# Patient Record
Sex: Female | Born: 1960
Health system: Southern US, Community
[De-identification: ages and names within clinical notes are randomized; demographics above are authoritative.]

## PROBLEM LIST (undated history)

## (undated) DIAGNOSIS — Z8489 Family history of other specified conditions: Secondary | ICD-10-CM

## (undated) DIAGNOSIS — R002 Palpitations: Secondary | ICD-10-CM

## (undated) DIAGNOSIS — M47817 Spondylosis without myelopathy or radiculopathy, lumbosacral region: Secondary | ICD-10-CM

## (undated) DIAGNOSIS — R011 Cardiac murmur, unspecified: Secondary | ICD-10-CM

## (undated) DIAGNOSIS — R112 Nausea with vomiting, unspecified: Secondary | ICD-10-CM

## (undated) DIAGNOSIS — Z860100 Personal history of colon polyps, unspecified: Secondary | ICD-10-CM

## (undated) DIAGNOSIS — K602 Anal fissure, unspecified: Secondary | ICD-10-CM

## (undated) DIAGNOSIS — M810 Age-related osteoporosis without current pathological fracture: Secondary | ICD-10-CM

## (undated) DIAGNOSIS — I4711 Inappropriate sinus tachycardia, so stated: Secondary | ICD-10-CM

## (undated) DIAGNOSIS — K219 Gastro-esophageal reflux disease without esophagitis: Secondary | ICD-10-CM

## (undated) DIAGNOSIS — Z9889 Other specified postprocedural states: Secondary | ICD-10-CM

## (undated) DIAGNOSIS — I639 Cerebral infarction, unspecified: Secondary | ICD-10-CM

## (undated) DIAGNOSIS — S82891A Other fracture of right lower leg, initial encounter for closed fracture: Secondary | ICD-10-CM

## (undated) DIAGNOSIS — S62101A Fracture of unspecified carpal bone, right wrist, initial encounter for closed fracture: Secondary | ICD-10-CM

## (undated) DIAGNOSIS — E8940 Asymptomatic postprocedural ovarian failure: Secondary | ICD-10-CM

## (undated) DIAGNOSIS — E785 Hyperlipidemia, unspecified: Secondary | ICD-10-CM

## (undated) DIAGNOSIS — F419 Anxiety disorder, unspecified: Secondary | ICD-10-CM

## (undated) DIAGNOSIS — E7800 Pure hypercholesterolemia, unspecified: Secondary | ICD-10-CM

## (undated) DIAGNOSIS — M1990 Unspecified osteoarthritis, unspecified site: Secondary | ICD-10-CM

## (undated) DIAGNOSIS — C801 Malignant (primary) neoplasm, unspecified: Secondary | ICD-10-CM

## (undated) DIAGNOSIS — K589 Irritable bowel syndrome without diarrhea: Secondary | ICD-10-CM

## (undated) DIAGNOSIS — K635 Polyp of colon: Secondary | ICD-10-CM

## (undated) DIAGNOSIS — G43909 Migraine, unspecified, not intractable, without status migrainosus: Secondary | ICD-10-CM

## (undated) DIAGNOSIS — R000 Tachycardia, unspecified: Secondary | ICD-10-CM

## (undated) DIAGNOSIS — F4310 Post-traumatic stress disorder, unspecified: Secondary | ICD-10-CM

## (undated) DIAGNOSIS — Z87442 Personal history of urinary calculi: Secondary | ICD-10-CM

## (undated) DIAGNOSIS — R921 Mammographic calcification found on diagnostic imaging of breast: Secondary | ICD-10-CM

## (undated) DIAGNOSIS — M8580 Other specified disorders of bone density and structure, unspecified site: Secondary | ICD-10-CM

## (undated) DIAGNOSIS — D649 Anemia, unspecified: Secondary | ICD-10-CM

## (undated) DIAGNOSIS — G4730 Sleep apnea, unspecified: Secondary | ICD-10-CM

## (undated) HISTORY — PX: WISDOM TOOTH EXTRACTION: SHX21

## (undated) HISTORY — DX: Gastro-esophageal reflux disease without esophagitis: K21.9

## (undated) HISTORY — DX: Inappropriate sinus tachycardia, so stated: I47.11

## (undated) HISTORY — DX: Cerebral infarction, unspecified: I63.9

## (undated) HISTORY — DX: Asymptomatic postprocedural ovarian failure: E89.40

## (undated) HISTORY — PX: PELVIC LAPAROSCOPY: SHX162

## (undated) HISTORY — DX: Tachycardia, unspecified: R00.0

## (undated) HISTORY — DX: Hyperlipidemia, unspecified: E78.5

## (undated) HISTORY — DX: Other specified disorders of bone density and structure, unspecified site: M85.80

## (undated) HISTORY — DX: Mammographic calcification found on diagnostic imaging of breast: R92.1

## (undated) HISTORY — DX: Age-related osteoporosis without current pathological fracture: M81.0

## (undated) HISTORY — DX: Pure hypercholesterolemia, unspecified: E78.00

## (undated) HISTORY — PX: TONSILLECTOMY: SHX5217

## (undated) HISTORY — PX: KNEE SURGERY: SHX244

## (undated) HISTORY — DX: Polyp of colon: K63.5

## (undated) HISTORY — DX: Fracture of unspecified carpal bone, right wrist, initial encounter for closed fracture: S62.101A

## (undated) HISTORY — DX: Anal fissure, unspecified: K60.2

## (undated) HISTORY — PX: ANAL FISSURE REPAIR: SHX2312

## (undated) HISTORY — DX: Personal history of colon polyps, unspecified: Z86.0100

## (undated) HISTORY — DX: Spondylosis without myelopathy or radiculopathy, lumbosacral region: M47.817

## (undated) HISTORY — DX: Irritable bowel syndrome, unspecified: K58.9

## (undated) HISTORY — DX: Personal history of colonic polyps: Z86.010

## (undated) HISTORY — DX: Anxiety disorder, unspecified: F41.9

## (undated) HISTORY — DX: Migraine, unspecified, not intractable, without status migrainosus: G43.909

## (undated) HISTORY — PX: OOPHORECTOMY: SHX86

## (undated) HISTORY — DX: Other fracture of right lower leg, initial encounter for closed fracture: S82.891A

## (undated) HISTORY — DX: Palpitations: R00.2

---

## 1992-10-29 HISTORY — PX: VAGINAL HYSTERECTOMY: SUR661

## 1998-10-29 HISTORY — PX: APPENDECTOMY: SHX54

## 2003-01-18 DIAGNOSIS — Z8679 Personal history of other diseases of the circulatory system: Secondary | ICD-10-CM | POA: Insufficient documentation

## 2014-10-10 DIAGNOSIS — G43709 Chronic migraine without aura, not intractable, without status migrainosus: Secondary | ICD-10-CM | POA: Insufficient documentation

## 2015-03-02 DIAGNOSIS — K219 Gastro-esophageal reflux disease without esophagitis: Secondary | ICD-10-CM | POA: Insufficient documentation

## 2015-03-02 DIAGNOSIS — Z86010 Personal history of colonic polyps: Secondary | ICD-10-CM | POA: Insufficient documentation

## 2015-03-02 DIAGNOSIS — Z800 Family history of malignant neoplasm of digestive organs: Secondary | ICD-10-CM | POA: Insufficient documentation

## 2015-03-02 DIAGNOSIS — K449 Diaphragmatic hernia without obstruction or gangrene: Secondary | ICD-10-CM

## 2015-03-16 DIAGNOSIS — R002 Palpitations: Secondary | ICD-10-CM | POA: Insufficient documentation

## 2015-03-30 HISTORY — PX: COLONOSCOPY W/ POLYPECTOMY: SHX1380

## 2015-04-01 HISTORY — PX: COLONOSCOPY WITH ESOPHAGOGASTRODUODENOSCOPY (EGD): SHX5779

## 2015-04-01 LAB — HM COLONOSCOPY

## 2016-08-29 LAB — HM MAMMOGRAPHY

## 2017-08-09 ENCOUNTER — Encounter: Payer: Self-pay | Admitting: Neurology

## 2017-08-09 ENCOUNTER — Encounter (INDEPENDENT_AMBULATORY_CARE_PROVIDER_SITE_OTHER): Payer: Self-pay

## 2017-08-09 ENCOUNTER — Encounter: Payer: Self-pay | Admitting: Physician Assistant

## 2017-08-09 ENCOUNTER — Ambulatory Visit (INDEPENDENT_AMBULATORY_CARE_PROVIDER_SITE_OTHER): Payer: 59 | Admitting: Physician Assistant

## 2017-08-09 VITALS — BP 115/78 | HR 72 | Ht 68.0 in | Wt 144.0 lb

## 2017-08-09 DIAGNOSIS — M810 Age-related osteoporosis without current pathological fracture: Secondary | ICD-10-CM | POA: Insufficient documentation

## 2017-08-09 DIAGNOSIS — Z7689 Persons encountering health services in other specified circumstances: Secondary | ICD-10-CM

## 2017-08-09 DIAGNOSIS — Z87891 Personal history of nicotine dependence: Secondary | ICD-10-CM | POA: Insufficient documentation

## 2017-08-09 DIAGNOSIS — F329 Major depressive disorder, single episode, unspecified: Secondary | ICD-10-CM | POA: Diagnosis not present

## 2017-08-09 DIAGNOSIS — Z90710 Acquired absence of both cervix and uterus: Secondary | ICD-10-CM

## 2017-08-09 DIAGNOSIS — F419 Anxiety disorder, unspecified: Secondary | ICD-10-CM | POA: Diagnosis not present

## 2017-08-09 DIAGNOSIS — F3289 Other specified depressive episodes: Secondary | ICD-10-CM

## 2017-08-09 DIAGNOSIS — F5105 Insomnia due to other mental disorder: Secondary | ICD-10-CM | POA: Diagnosis not present

## 2017-08-09 DIAGNOSIS — Z8739 Personal history of other diseases of the musculoskeletal system and connective tissue: Secondary | ICD-10-CM | POA: Diagnosis not present

## 2017-08-09 DIAGNOSIS — F411 Generalized anxiety disorder: Secondary | ICD-10-CM | POA: Insufficient documentation

## 2017-08-09 DIAGNOSIS — IMO0002 Reserved for concepts with insufficient information to code with codable children: Secondary | ICD-10-CM

## 2017-08-09 DIAGNOSIS — G43709 Chronic migraine without aura, not intractable, without status migrainosus: Secondary | ICD-10-CM | POA: Diagnosis not present

## 2017-08-09 DIAGNOSIS — E789 Disorder of lipoprotein metabolism, unspecified: Secondary | ICD-10-CM | POA: Insufficient documentation

## 2017-08-09 DIAGNOSIS — F99 Mental disorder, not otherwise specified: Secondary | ICD-10-CM | POA: Diagnosis not present

## 2017-08-09 DIAGNOSIS — J309 Allergic rhinitis, unspecified: Secondary | ICD-10-CM | POA: Insufficient documentation

## 2017-08-09 MED ORDER — SUVOREXANT 20 MG PO TABS: 1.0000 | ORAL_TABLET | Freq: Every day | ORAL | 0 refills | Status: DC

## 2017-08-09 MED ORDER — CALCIUM CARBONATE-VITAMIN D 600-400 MG-UNIT PO TABS: 1.0000 | ORAL_TABLET | Freq: Two times a day (BID) | ORAL | 11 refills | Status: AC

## 2017-08-09 MED ORDER — SUVOREXANT 15 MG PO TABS: 1.0000 | ORAL_TABLET | Freq: Every day | ORAL | 0 refills | Status: DC

## 2017-08-09 MED ORDER — SUVOREXANT 10 MG PO TABS: 10.0000 mg | ORAL_TABLET | Freq: Every day | ORAL | 0 refills | Status: DC

## 2017-08-09 MED ORDER — TOPIRAMATE 25 MG PO TABS: 75.0000 mg | ORAL_TABLET | Freq: Every day | ORAL | 1 refills | Status: DC

## 2017-08-09 NOTE — Patient Instructions (Addendum)
- Trial Belsomra. Start with 32m at bedtime for 3 nights. If not effective, increase to 134mand so on - Sleep hygiene  Sleep Hygiene . Limiting daytime naps to 30 minutes . Napping does not make up for inadequate nighttime sleep. However, a short nap of 20-30 minutes can help to improve mood, alertness and performance.  . Avoiding stimulants such as  caffeine and nicotine close to bedtime.  And when it comes to alcohol, moderation is key 4. While alcohol is well-known to help you fall asleep faster, too much close to bedtime can disrupt sleep in the second half of the night as the body begins to process the alcohol.    . Exercising to promote good quality sleep.  As little as 10 minutes of aerobic exercise, such as walking or cycling, can drastically improve nighttime sleep quality.  For the best night's sleep, most people should avoid strenuous workouts close to bedtime. However, the effect of intense nighttime exercise on sleep differs from person to person, so find out what works best for you.   . Steering clear of food that can be disruptive right before sleep.   Heavy or rich foods, fatty or fried meals, spicy dishes, citrus fruits, and carbonated drinks can trigger indigestion for some people. When this occurs close to bedtime, it can lead to painful heartburn that disrupts sleep. . Ensuring adequate exposure to natural light.  This is particularly important for individuals who may not venture outside frequently. Exposure to sunlight during the day, as well as darkness at night, helps to maintain a healthy sleep-wake cycle . . Marland Kitchenstablishing a regular relaxing bedtime routine.  A regular nightly routine helps the body recognize that it is bedtime. This could include taking warm shower or bath, reading a book, or light stretches. When possible, try to avoid emotionally upsetting conversations and activities before attempting to sleep. . Making sure that the sleep environment is pleasant.  Mattress and  pillows should be comfortable. The bedroom should be cool - between 60 and 67 degrees - for optimal sleep. Bright light from lamps, cell phone and TV screens can make it difficult to fall asleep4, so turn those light off or adjust them when possible. Consider using blackout curtains, eye shades, ear plugs, "white noise" machines, humidifiers, fans and other devices that can make the bedroom more relaxing.     Denosumab injection What is this medicine? DENOSUMAB (den oh sue mab) slows bone breakdown. Prolia is used to treat osteoporosis in women after menopause and in men. XgDelton Sees used to treat a high calcium level due to cancer and to prevent bone fractures and other bone problems caused by multiple myeloma or cancer bone metastases. XgDelton Sees also used to treat giant cell tumor of the bone. This medicine may be used for other purposes; ask your health care provider or pharmacist if you have questions. COMMON BRAND NAME(S): Prolia, XGEVA What should I tell my health care provider before I take this medicine? They need to know if you have any of these conditions: -dental disease -having surgery or tooth extraction -infection -kidney disease -low levels of calcium or Vitamin D in the blood -malnutrition -on hemodialysis -skin conditions or sensitivity -thyroid or parathyroid disease -an unusual reaction to denosumab, other medicines, foods, dyes, or preservatives -pregnant or trying to get pregnant -breast-feeding How should I use this medicine? This medicine is for injection under the skin. It is given by a health care professional in a hospital or clinic setting.  If you are getting Prolia, a special MedGuide will be given to you by the pharmacist with each prescription and refill. Be sure to read this information carefully each time. For Prolia, talk to your pediatrician regarding the use of this medicine in children. Special care may be needed. For Delton See, talk to your pediatrician  regarding the use of this medicine in children. While this drug may be prescribed for children as young as 13 years for selected conditions, precautions do apply. Overdosage: If you think you have taken too much of this medicine contact a poison control center or emergency room at once. NOTE: This medicine is only for you. Do not share this medicine with others. What if I miss a dose? It is important not to miss your dose. Call your doctor or health care professional if you are unable to keep an appointment. What may interact with this medicine? Do not take this medicine with any of the following medications: -other medicines containing denosumab This medicine may also interact with the following medications: -medicines that lower your chance of fighting infection -steroid medicines like prednisone or cortisone This list may not describe all possible interactions. Give your health care provider a list of all the medicines, herbs, non-prescription drugs, or dietary supplements you use. Also tell them if you smoke, drink alcohol, or use illegal drugs. Some items may interact with your medicine. What should I watch for while using this medicine? Visit your doctor or health care professional for regular checks on your progress. Your doctor or health care professional may order blood tests and other tests to see how you are doing. Call your doctor or health care professional for advice if you get a fever, chills or sore throat, or other symptoms of a cold or flu. Do not treat yourself. This drug may decrease your body's ability to fight infection. Try to avoid being around people who are sick. You should make sure you get enough calcium and vitamin D while you are taking this medicine, unless your doctor tells you not to. Discuss the foods you eat and the vitamins you take with your health care professional. See your dentist regularly. Brush and floss your teeth as directed. Before you have any dental  work done, tell your dentist you are receiving this medicine. Do not become pregnant while taking this medicine or for 5 months after stopping it. Talk with your doctor or health care professional about your birth control options while taking this medicine. Women should inform their doctor if they wish to become pregnant or think they might be pregnant. There is a potential for serious side effects to an unborn child. Talk to your health care professional or pharmacist for more information. What side effects may I notice from receiving this medicine? Side effects that you should report to your doctor or health care professional as soon as possible: -allergic reactions like skin rash, itching or hives, swelling of the face, lips, or tongue -bone pain -breathing problems -dizziness -jaw pain, especially after dental work -redness, blistering, peeling of the skin -signs and symptoms of infection like fever or chills; cough; sore throat; pain or trouble passing urine -signs of low calcium like fast heartbeat, muscle cramps or muscle pain; pain, tingling, numbness in the hands or feet; seizures -unusual bleeding or bruising -unusually weak or tired Side effects that usually do not require medical attention (report to your doctor or health care professional if they continue or are bothersome): -constipation -diarrhea -headache -joint pain -loss  of appetite -muscle pain -runny nose -tiredness -upset stomach This list may not describe all possible side effects. Call your doctor for medical advice about side effects. You may report side effects to FDA at 1-800-FDA-1088. Where should I keep my medicine? This medicine is only given in a clinic, doctor's office, or other health care setting and will not be stored at home. NOTE: This sheet is a summary. It may not cover all possible information. If you have questions about this medicine, talk to your doctor, pharmacist, or health care provider.  2018  Elsevier/Gold Standard (2016-11-06 19:17:21)

## 2017-08-09 NOTE — Progress Notes (Signed)
HPI:                                                                Alice Williams is a 56 y.o. female who presents to [facility] [place] today to establish care  Chronic insomnia: patient reports longstanding history of insomnia for many years. She endorses multiple nighttime awakenings every 1-2 hours, difficulty with sleep maintenance, and does not ever feel rested. Prior treatments tried include Unisom, Ambien (2000)  Inappropriate sinus tachycardia: she was evaluated by Cardiology at [facility] in 2015. She had a nondiagnostic tilt table and normal echocardiogram. She is doing well on Nadolol daily and an extra dose prn for activity. She denies any palpitations or syncope.   Chronic Migraine: reports history of complex migraines/occular migraines. Currently on topamax 75mg  daily but still having 4 headaches per week. She is allergic to triptans. She has been on Elavil and other beta blockers int he past. She is requesting a referral to Neuro.   Past Medical History:  Diagnosis Date  . Anxiety   . Breast calcifications on mammogram   . GERD (gastroesophageal reflux disease)   . History of colon polyps   . Inappropriate sinus tachycardia   . Migraines   . Osteopenia   . Palpitations   . Premature surgical menopause    Past Surgical History:  Procedure Laterality Date  . ANAL FISSURE REPAIR    . APPENDECTOMY    . COLONOSCOPY W/ POLYPECTOMY  03/2015  . KNEE SURGERY    . OOPHORECTOMY    . [place]  . PELVIC LAPAROSCOPY     x 4 for endometriosis   . VAGINAL HYSTERECTOMY     Social History  Substance Use Topics  . Smoking status: Former Smoker    Quit date: 08/09/2006  . Smokeless tobacco: Never Used  . Alcohol use Yes   family history includes Alcohol abuse in her father; Breast cancer in her mother; Colon cancer in her mother; Heart attack in her father; Hyperlipidemia in her brother and father.  ROS: Review of  Systems  Constitutional: Negative.   HENT: Negative.   Eyes: Negative.   Respiratory: Negative.   Cardiovascular: Positive for palpitations.  Gastrointestinal: Negative.   Genitourinary: Negative.   Musculoskeletal: Negative.   Skin: Negative.   Neurological: Positive for headaches (migraines).  Endo/Heme/Allergies: Negative.   Psychiatric/Behavioral: Positive for depression. The patient is nervous/anxious and has insomnia.      Medications: Current Outpatient Prescriptions  Medication Sig Dispense Refill  . aspirin-acetaminophen-caffeine (EXCEDRIN MIGRAINE) 250-250-65 MG tablet Take by mouth.    . hydrOXYzine (ATARAX/VISTARIL) 10 MG tablet Take 1 tablet by mouth 3 (three) times daily as needed.    . nadolol (CORGARD) 20 MG tablet Take 1 tablet by mouth daily.    Marland Kitchen tiZANidine (ZANAFLEX) 4 MG capsule Take 1 capsule by mouth 3 (three) times daily as needed.    . topiramate (TOPAMAX) 25 MG tablet Take 3 tablets (75 mg total) by mouth at bedtime. 90 tablet 1  . Calcium Carbonate-Vitamin D 600-400 MG-UNIT tablet Take 1 tablet by mouth 2 (two) times daily. 60 tablet 11  . Suvorexant 10 MG TABS Take 10 mg by mouth at bedtime. 10 tablet 0  .  Suvorexant 15 MG TABS Take 1 tablet by mouth at bedtime. 10 tablet 0  . Suvorexant 20 MG TABS Take 1 tablet by mouth at bedtime. 10 tablet 0   No current facility-administered medications for this visit.    Allergies  Allergen Reactions  . Sulfa Antibiotics Anaphylaxis  . Sumatriptan Succinate Anaphylaxis    Objective:  BP 115/78   Pulse 72   Ht 5\' 8"  (1.727 m)   Wt 144 lb (65.3 kg)   BMI 21.90 kg/m  Gen:  alert, not ill-appearing, no distress, appropriate for age 70: head normocephalic without obvious abnormality, conjunctiva and cornea clear, trachea midline Pulm: Normal work of breathing, normal phonation Neuro: alert and oriented x 3, no tremor MSK: extremities atraumatic, normal gait and station Skin: intact, no rashes on exposed  skin, no jaundice, no cyanosis Psych: well-groomed, cooperative, good eye contact, depressed mood, affect mood-congruent, speech is articulate, and thought processes clear and goal-directed  Depression screen [facility] 2/9 08/09/2017  Decreased Interest 2  Down, Depressed, Hopeless 2  PHQ - 2 Score 4  Altered sleeping 3  Tired, decreased energy 3  Change in appetite 1  Feeling bad or failure about yourself  0  Trouble concentrating 1  Moving slowly or fidgety/restless 0  Suicidal thoughts 0  PHQ-9 Score 12    No results found for this or any previous visit (from the past 72 hour(s)). No results found.    Assessment and Plan: 56 y.o. female with   1. Encounter to establish care - reviewed PMH, PSH, PFH, meds and allergies - reviewed health maintenance - Colonoscopy UTD, due 2021, requesting outside records - not a candidate for Pap - Mammogram UTD, 08/2016, requesting records - influenza UTD per patient [facility] employee)  2. Anxiety disorder, unspecified type - patient declines management today. Prefers to address insomnia  3. Insomnia due to other mental disorder - will trial Belsomra. Provided with coupon card for free samples.  - sleep hygiene - follow-up in 4 weeks - Suvorexant 10 MG TABS; Take 10 mg by mouth at bedtime.  Dispense: 10 tablet; Refill: 0 - Suvorexant 15 MG TABS; Take 1 tablet by mouth at bedtime.  Dispense: 10 tablet; Refill: 0 - Suvorexant 20 MG TABS; Take 1 tablet by mouth at bedtime.  Dispense: 10 tablet; Refill: 0  4. Minor depressive disorder - PHQ9 score 12 - no acute safety issues - patient declines management today. Prefers to address insomnia  5. Chronic migraine - Ambulatory referral to Neurology. Patient may be a good candidate for Aimovig or Botox - topiramate (TOPAMAX) 25 MG tablet; Take 3 tablets (75 mg total) by mouth at bedtime.  Dispense: 90 tablet; Refill: 1  6. Hx of osteopenia - due for DEXA. She underwent early surgical menopause -  reports severe GERD with bisphosphonates. May be a good candidate for prolia - counseled on importance of calcium and vitamin d supplementaiton - Calcium Carbonate-Vitamin D 600-400 MG-UNIT tablet; Take 1 tablet by mouth 2 (two) times daily.  Dispense: 60 tablet; Refill: 11 - DG Bone Density; Future  7. History of hysterectomy for benign disease  Patient education and anticipatory guidance given Patient agrees with treatment plan Follow-up as needed if symptoms worsen or fail to improve  Darlyne Russian PA-C

## 2017-08-12 ENCOUNTER — Other Ambulatory Visit: Payer: Self-pay | Admitting: Physician Assistant

## 2017-08-12 DIAGNOSIS — F99 Mental disorder, not otherwise specified: Principal | ICD-10-CM

## 2017-08-12 DIAGNOSIS — F5105 Insomnia due to other mental disorder: Secondary | ICD-10-CM

## 2017-08-14 ENCOUNTER — Encounter: Payer: Self-pay | Admitting: Physician Assistant

## 2017-08-21 ENCOUNTER — Other Ambulatory Visit: Payer: 59

## 2017-08-21 ENCOUNTER — Other Ambulatory Visit: Payer: Self-pay | Admitting: Physician Assistant

## 2017-08-21 DIAGNOSIS — Z1231 Encounter for screening mammogram for malignant neoplasm of breast: Secondary | ICD-10-CM

## 2017-08-27 ENCOUNTER — Encounter: Payer: Self-pay | Admitting: Physician Assistant

## 2017-08-27 DIAGNOSIS — M5416 Radiculopathy, lumbar region: Secondary | ICD-10-CM | POA: Insufficient documentation

## 2017-08-27 DIAGNOSIS — G8929 Other chronic pain: Secondary | ICD-10-CM | POA: Insufficient documentation

## 2017-08-27 DIAGNOSIS — M533 Sacrococcygeal disorders, not elsewhere classified: Secondary | ICD-10-CM

## 2017-08-28 ENCOUNTER — Encounter: Payer: Self-pay | Admitting: Physician Assistant

## 2017-08-28 ENCOUNTER — Ambulatory Visit (INDEPENDENT_AMBULATORY_CARE_PROVIDER_SITE_OTHER): Payer: 59

## 2017-08-28 ENCOUNTER — Encounter: Payer: Self-pay | Admitting: Family Medicine

## 2017-08-28 ENCOUNTER — Ambulatory Visit (INDEPENDENT_AMBULATORY_CARE_PROVIDER_SITE_OTHER): Payer: 59 | Admitting: Family Medicine

## 2017-08-28 VITALS — BP 118/71 | HR 67 | Wt 145.0 lb

## 2017-08-28 DIAGNOSIS — M810 Age-related osteoporosis without current pathological fracture: Secondary | ICD-10-CM | POA: Diagnosis not present

## 2017-08-28 DIAGNOSIS — M5416 Radiculopathy, lumbar region: Secondary | ICD-10-CM | POA: Diagnosis not present

## 2017-08-28 DIAGNOSIS — M47817 Spondylosis without myelopathy or radiculopathy, lumbosacral region: Secondary | ICD-10-CM | POA: Diagnosis not present

## 2017-08-28 DIAGNOSIS — M533 Sacrococcygeal disorders, not elsewhere classified: Secondary | ICD-10-CM

## 2017-08-28 DIAGNOSIS — Z780 Asymptomatic menopausal state: Secondary | ICD-10-CM | POA: Diagnosis not present

## 2017-08-28 DIAGNOSIS — Z8739 Personal history of other diseases of the musculoskeletal system and connective tissue: Secondary | ICD-10-CM

## 2017-08-28 DIAGNOSIS — Z1231 Encounter for screening mammogram for malignant neoplasm of breast: Secondary | ICD-10-CM | POA: Diagnosis not present

## 2017-08-28 DIAGNOSIS — M545 Low back pain: Secondary | ICD-10-CM | POA: Diagnosis not present

## 2017-08-28 HISTORY — DX: Age-related osteoporosis without current pathological fracture: M81.0

## 2017-08-28 HISTORY — DX: Spondylosis without myelopathy or radiculopathy, lumbosacral region: M47.817

## 2017-08-28 NOTE — Progress Notes (Signed)
Good morning,  Your x-rays show arthritis of your low back/sacrum. There is no fracture, dislocation, or degenerative disc disease.  I still recommend follow-up with Sports Medicine at your earliest convenience to address this.  Best, Evlyn Clines

## 2017-08-28 NOTE — Progress Notes (Signed)
Good morning Alice Williams,  Your bone density test shows osteoporosis of your hip and decreased bone mineral density of your spine (aka osteopenia). This increases your overall risk of fracture. I recommend you make an appointment with me to discuss treatment options.   Best, Evlyn Clines

## 2017-08-28 NOTE — Patient Instructions (Signed)
Thank you for coming in today. Attend PT.  Use a heating pad and TENS unit.  Do the home exercises even when you get better.   Recheck with me 6 weeks.   TENS UNIT: This is helpful for muscle pain and spasm.   Search and Purchase a TENS 7000 2nd edition at  www.tenspros.com or www.[email or web address].com It should be less than $30.     TENS unit instructions: Do not shower or bathe with the unit on Turn the unit off before removing electrodes or batteries If the electrodes lose stickiness add a drop of water to the electrodes after they are disconnected from the unit and place on plastic sheet. If you continued to have difficulty, call the TENS unit company to purchase more electrodes. Do not apply lotion on the skin area prior to use. Make sure the skin is clean and dry as this will help prolong the life of the electrodes. After use, always check skin for unusual red areas, rash or other skin difficulties. If there are any skin problems, does not apply electrodes to the same area. Never remove the electrodes from the unit by pulling the wires. Do not use the TENS unit or electrodes other than as directed. Do not change electrode placement without consultating your therapist or physician. Keep 2 fingers with between each electrode. Wear time ratio is 2:1, on to off times.    For example on for 30 minutes off for 15 minutes and then on for 30 minutes off for 15 minutes     Lumbosacral Strain Lumbosacral strain is an injury that causes pain in the lower back (lumbosacral spine). This injury usually occurs from overstretching the muscles or ligaments along your spine. A strain can affect one or more muscles or cord-like tissues that connect bones to other bones (ligaments). What are the causes? This condition may be caused by:  A hard, direct hit (blow) to the back.  Excessive stretching of the lower back muscles. This may result from: ? A fall. ? Lifting something heavy. ? Repetitive  movements such as bending or crouching.  What increases the risk? The following factors may increase your risk of getting this condition:  Participating in sports or activities that involve: ? A sudden twist of the back. ? Pushing or pulling motions.  Being overweight or obese.  Having poor strength and flexibility, especially tight hamstrings or weak muscles in the back or abdomen.  Having too much of a curve in the lower back.  Having a pelvis that is tilted forward.  What are the signs or symptoms? The main symptom of this condition is pain in the lower back, at the site of the strain. Pain may extend (radiate) down one or both legs. How is this diagnosed? This condition is diagnosed based on:  Your symptoms.  Your medical history.  A physical exam. ? Your health care provider may push on certain areas of your back to determine the source of your pain. ? You may be asked to bend forward, backward, and side to side to assess the severity of your pain and your range of motion.  Imaging tests, such as: ? X-rays. ? MRI.  How is this treated? Treatment for this condition may include:  Putting heat and cold on the affected area.  Medicines to help relieve pain and relax your muscles (muscle relaxants).  NSAIDs to help reduce swelling and discomfort.  When your symptoms improve, it is important to gradually return to your normal routine  as soon as possible to reduce pain, avoid stiffness, and avoid loss of muscle strength. Generally, symptoms should improve within 6 weeks of treatment. However, recovery time varies. Follow these instructions at home: Managing pain, stiffness, and swelling   If directed, put ice on the injured area during the first 24 hours after your strain. ? Put ice in a plastic bag. ? Place a towel between your skin and the bag. ? Leave the ice on for 20 minutes, 2-3 times a day.  If directed, put heat on the affected area as often as told by your  health care provider. Use the heat source that your health care provider recommends, such as a moist heat pack or a heating pad. ? Place a towel between your skin and the heat source. ? Leave the heat on for 20-30 minutes. ? Remove the heat if your skin turns bright red. This is especially important if you are unable to feel pain, heat, or cold. You may have a greater risk of getting burned. Activity  Rest and return to your normal activities as told by your health care provider. Ask your health care provider what activities are safe for you.  Avoid activities that take a lot of energy for as long as told by your health care provider. General instructions  Take over-the-counter and prescription medicines only as told by your health care provider.  Donot drive or use heavy machinery while taking prescription pain medicine.  Do not use any products that contain nicotine or tobacco, such as cigarettes and e-cigarettes. If you need help quitting, ask your health care provider.  Keep all follow-up visits as told by your health care provider. This is important. How is this prevented?  Use correct form when playing sports and lifting heavy objects.  Use good posture when sitting and standing.  Maintain a healthy weight.  Sleep on a mattress with medium firmness to support your back.  Be safe and responsible while being active to avoid falls.  Do at least 150 minutes of moderate-intensity exercise each week, such as brisk walking or water aerobics. Try a form of exercise that takes stress off your back, such as swimming or stationary cycling.  Maintain physical fitness, including: ? Strength. ? Flexibility. ? Cardiovascular fitness. ? Endurance. Contact a health care provider if:  Your back pain does not improve after 6 weeks of treatment.  Your symptoms get worse. Get help right away if:  Your back pain is severe.  You cannot stand or walk.  You have difficulty controlling  when you urinate or when you have a bowel movement.  You feel nauseous or you vomit.  Your feet get very cold.  You have numbness, tingling, weakness, or problems using your arms or legs.  You develop any of the following: ? Shortness of breath. ? Dizziness. ? Pain in your legs. ? Weakness in your buttocks or legs. ? Discoloration of the skin on your toes or legs. This information is not intended to replace advice given to you by your health care provider. Make sure you discuss any questions you have with your health care provider. Document Released: 07/25/2005 Document Revised: 05/04/2016 Document Reviewed: 03/18/2016 Elsevier Interactive Patient Education  2017 [employer].

## 2017-08-29 NOTE — Progress Notes (Signed)
Subjective:    I'm seeing this patient as a consultation for:  Trixie Dredge, PA-C   CC: back pain  HPI: Deshayla notes chronic low back pain ongoing for years. The pain has been worsening over the last few months. 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 She notes the pain is present with prolonged sitting and transitioning from sitting to standing. . She denies any pain with walking and with laying.  She denies any significant radiating pain bowel bladder dysfunction fevers or chills. She feels well otherwise.  She has tried ibuprofen which does help some. She has not tried any physical therapy activities. She was seen by her primary care provider earlier this month who obtained a back x-ray showing some degenerative changes.   Past medical history, Surgical history, Family history not pertinant except as noted below, Social history, Allergies, and medications have been entered into the medical record, reviewed, and no changes needed.   Review of Systems: No headache, visual changes, nausea, vomiting, diarrhea, constipation, dizziness, abdominal pain, skin rash, fevers, chills, night sweats, weight loss, swollen lymph nodes, body aches, joint swelling, muscle aches, chest pain, shortness of breath, mood changes, visual or auditory hallucinations.   Objective:    Vitals:   08/28/17 1310  BP: 118/71  Pulse: 67   General: Well Developed, well nourished, and in no acute distress.  Neuro/Psych: Alert and oriented x3, extra-ocular muscles intact, able to move all 4 extremities, sensation grossly intact. Skin: Warm and dry, no rashes noted.  Respiratory: Not using accessory muscles, speaking in full sentences, trachea midline.  Cardiovascular: Pulses palpable, no extremity edema. Abdomen: Does not appear distended. MSK:  L spine nontender to spinal midline. Minimally tender palpation bilateral lumbar paraspinal muscles. Lumbar motion normal  flexion slightly reduced rotation and lateral flexion. Reduced extension with pain.   Lower extremity sensation reflexes and strength are intact with the exception of hip adduction which is slightly reduced bilaterally 4+/5.  Normal gait.  No results found for this or any previous visit (from the past 24 hour(s)). Dg Lumbar Spine Complete  Result Date: 08/28/2017 CLINICAL DATA:  Lumbago for 2 months EXAM: LUMBAR SPINE - COMPLETE 4+ VIEW COMPARISON:  None. FINDINGS: Frontal, lateral, spot lumbosacral lateral, and bilateral oblique views were obtained. There are 5 non-rib-bearing lumbar type vertebral bodies. There is slight upper lumbar dextroscoliosis. There is no evident fracture or spondylolisthesis. Disc spaces appear unremarkable. There is facet osteoarthritic change at L5-S1 bilaterally. IMPRESSION: Facet osteoarthritic change at L5-S1 bilaterally. No appreciable disc space narrowing. No fracture or spondylolisthesis. Mild scoliosis present. Electronically Signed   By: Lowella Grip III M.D.   On: 08/28/2017 11:09   Dg Sacrum/coccyx  Result Date: 08/28/2017 CLINICAL DATA:  Pain for 2 months EXAM: SACRUM AND COCCYX - 2+ VIEW COMPARISON:  None. FINDINGS: There is no evidence of fracture or other focal bone lesions. Frontal, tilt frontal, and lateral views were obtained. There is no fracture or diastases. Sacroiliac joints appear symmetric and normal bilaterally. No sacroiliitis. Visualized portions of hip joints appear normal bilaterally. There is postoperative change in the left pelvis. IMPRESSION: No fracture or diastases.  No evident arthropathic change. Electronically Signed   By: Lowella Grip III M.D.   On: 08/28/2017 11:08   Dg Bone Density  Result Date: 08/28/2017 EXAM: DUAL X-RAY ABSORPTIOMETRY (DXA) FOR BONE MINERAL DENSITY IMPRESSION: Referring Physician:  Elson Areas CUMMINGS PATIENT: Name: Natha, Guin Patient ID: [id number] Birth Date: 27-Mar-1961  Height: 67.0 in.  Sex: Female Measured: 08/28/2017 Weight: 141.0 lbs. Indications: Caucasian, Estrogen Deficiency, Hysterectomy, Low Calcium Intake, Oophorectomy (Unilateral), Postmenopausal, Previous Smoker Fractures: Ribs Treatments: Boniva, HRT ASSESSMENT: The BMD measured at Femur Total Right is 0.692 g/cm2 with a T-score of -2.5. This patient is considered osteoporotic according to [facility]) criteria. Site Region Measured Date Measured Age WHO YA BMD Classification T-score AP Spine L1-L4 08/28/2017 55.7 Osteopenia -2.1 0.939 g/cm2 DualFemur Total Right 9/28/2017 55.8 years Osteoporosis -2.5 0.692 g/cm2 World Health Organization [facility]) criteria for post-menopausal, Caucasian Women: Normal       T-score at or above -1 SD Osteopenia   T-score between -1 and -2.5 SD Osteoporosis T-score at or below -2.5 SD RECOMMENDATION: [place] recommends that FDA-approved medical therapies be considered in postmenopausal women and men age 62 or older with a: 1. Hip or vertebral (clinical or morphometric) fracture. 2. T-score of <-2.5 at the spine or hip. 3. Ten-year fracture probability by FRAX of 3% or greater for hip fracture or 20% or greater for major osteoporotic fracture. All treatments decisions require clinical judgment and consideration of individual patient factors, including patient preferences, co-morbidities, previous drug use, risk factors not captured in the FRAX model (e.g. falls, vitamin D deficiency, increased bone turnover, interval significant decline in bone density) and possible under - or over-estimation of fracture risk by FRAX. All patients should ensure an adequate intake of dietary calcium (1200 mg/d) and vitamin D (800 IU daily) unless contraindicated. FOLLOW-UP: People with diagnosed cases of osteoporosis or at high risk for fracture should have regular bone mineral density tests. For patients eligible for Medicare, routine testing is allowed once every 2 years. The  testing frequency can be increased to one year for patients who have rapidly progressing disease, those who are receiving or discontinuing medical therapy to restore bone mass, or have additional risk factors. I have reviewed this report and agree with the above findings. [facility] Electronically Signed   By: Rolm Baptise M.D.   On: 08/28/2017 09:55    Impression and Recommendations:    Assessment and Plan: 56 y.o. female wilumbosacral pain due to some DJD and myofascial disruption. We had a lengthy discussion about the data regarding management of chronic axial back pain.  she understands that we are unlikely to be able to provide complete pain relief. Discussion of options we plan for physical therapy trial for 6 weeks. If not better after 6 weeks will proceed with MRI for facet injection planning.    Orders Placed This Encounter  Procedures  . Ambulatory referral to Physical Therapy    Referral Priority:   Routine    Referral Type:   Physical Medicine    Referral Reason:   Specialty Services Required    Requested Specialty:   Physical Therapy   No orders of the defined types were placed in this encounter.   Discussed warning signs or symptoms. Please see discharge instructions. Patient expresses understanding.

## 2017-08-30 ENCOUNTER — Ambulatory Visit: Payer: 59 | Admitting: Physician Assistant

## 2017-09-03 ENCOUNTER — Telehealth: Payer: Self-pay | Admitting: Physician Assistant

## 2017-09-03 ENCOUNTER — Encounter: Payer: Self-pay | Admitting: Physician Assistant

## 2017-09-03 ENCOUNTER — Ambulatory Visit (INDEPENDENT_AMBULATORY_CARE_PROVIDER_SITE_OTHER): Payer: 59 | Admitting: Physician Assistant

## 2017-09-03 VITALS — BP 136/80 | HR 57 | Wt 143.0 lb

## 2017-09-03 DIAGNOSIS — M810 Age-related osteoporosis without current pathological fracture: Secondary | ICD-10-CM | POA: Diagnosis not present

## 2017-09-03 DIAGNOSIS — Z130 Encounter for screening for diseases of the blood and blood-forming organs and certain disorders involving the immune mechanism: Secondary | ICD-10-CM

## 2017-09-03 DIAGNOSIS — F99 Mental disorder, not otherwise specified: Secondary | ICD-10-CM

## 2017-09-03 DIAGNOSIS — F5105 Insomnia due to other mental disorder: Secondary | ICD-10-CM

## 2017-09-03 DIAGNOSIS — F419 Anxiety disorder, unspecified: Secondary | ICD-10-CM

## 2017-09-03 MED ORDER — SUVOREXANT 20 MG PO TABS: 1.0000 | ORAL_TABLET | Freq: Every day | ORAL | 3 refills | Status: DC

## 2017-09-03 NOTE — Patient Instructions (Addendum)
Anxiety: Talkspace or BetterHelp for online counseling Headspace, Breathe - apps for meditation YouTube has an array of guided mediations and relaxation exercises  Insomnia: - continue Suvorexant 20mg  - sleep hygiene   Sleep Hygiene . Limiting daytime naps to 30 minutes . Napping does not make up for inadequate nighttime sleep. However, a short nap of 20-30 minutes can help to improve mood, alertness and performance.  . Avoiding stimulants such as  caffeine and nicotine close to bedtime.  And when it comes to alcohol, moderation is key 4. While alcohol is well-known to help you fall asleep faster, too much close to bedtime can disrupt sleep in the second half of the night as the body begins to process the alcohol.    . Exercising to promote good quality sleep.  As little as 10 minutes of aerobic exercise, such as walking or cycling, can drastically improve nighttime sleep quality.  For the best night's sleep, most people should avoid strenuous workouts close to bedtime. However, the effect of intense nighttime exercise on sleep differs from person to person, so find out what works best for you.   . Steering clear of food that can be disruptive right before sleep.   Heavy or rich foods, fatty or fried meals, spicy dishes, citrus fruits, and carbonated drinks can trigger indigestion for some people. When this occurs close to bedtime, it can lead to painful heartburn that disrupts sleep. . Ensuring adequate exposure to natural light.  This is particularly important for individuals who may not venture outside frequently. Exposure to sunlight during the day, as well as darkness at night, helps to maintain a healthy sleep-wake cycle . Marland Kitchen Establishing a regular relaxing bedtime routine.  A regular nightly routine helps the body recognize that it is bedtime. This could include taking warm shower or bath, reading a book, or light stretches. When possible, try to avoid emotionally upsetting conversations and  activities before attempting to sleep. . Making sure that the sleep environment is pleasant.  Mattress and pillows should be comfortable. The bedroom should be cool - between 60 and 67 degrees - for optimal sleep. Bright light from lamps, cell phone and TV screens can make it difficult to fall asleep4, so turn those light off or adjust them when possible. Consider using blackout curtains, eye shades, ear plugs, "white noise" machines, humidifiers, fans and other devices that can make the bedroom more relaxing.   Bone Health Bones protect organs, store calcium, and anchor muscles. Good health habits, such as eating nutritious foods and exercising regularly, are important for maintaining healthy bones. They can also help to prevent a condition that causes bones to lose density and become weak and brittle (osteoporosis). Why is bone mass important? Bone mass refers to the amount of bone tissue that you have. The higher your bone mass, the stronger your bones. An important step toward having healthy bones throughout life is to have strong and dense bones during childhood. A young adult who has a high bone mass is more likely to have a high bone mass later in life. Bone mass at its greatest it is called peak bone mass. A large decline in bone mass occurs in older adults. In women, it occurs about the time of menopause. During this time, it is important to practice good health habits, because if more bone is lost than what is replaced, the bones will become less healthy and more likely to break (fracture). If you find that you have a low bone mass,  you may be able to prevent osteoporosis or further bone loss by changing your diet and lifestyle. How can I find out if my bone mass is low? Bone mass can be measured with an X-ray test that is called a bone mineral density (BMD) test. This test is recommended for all women who are age 21 or older. It may also be recommended for men who are age 158 or older, or for  people who are more likely to develop osteoporosis due to:  Having bones that break easily.  Having a long-term disease that weakens bones, such as kidney disease or rheumatoid arthritis.  Having menopause earlier than normal.  Taking medicine that weakens bones, such as steroids, thyroid hormones, or hormone treatment for breast cancer or prostate cancer.  Smoking.  Drinking three or more alcoholic drinks each day.  What are the nutritional recommendations for healthy bones? To have healthy bones, you need to get enough of the right minerals and vitamins. Most nutrition experts recommend getting these nutrients from the foods that you eat. Nutritional recommendations vary from person to person. Ask your health care provider what is healthy for you. Here are some general guidelines. Calcium Recommendations Calcium is the most important (essential) mineral for bone health. Most people can get enough calcium from their diet, but supplements may be recommended for people who are at risk for osteoporosis. Good sources of calcium include:  Dairy products, such as low-fat or nonfat milk, cheese, and yogurt.  Dark green leafy vegetables, such as bok choy and broccoli.  Calcium-fortified foods, such as orange juice, cereal, bread, soy beverages, and tofu products.  Nuts, such as almonds.  Follow these recommended amounts for daily calcium intake:  Children, age 60?3: 700 mg.  Children, age 15?8: 1,000 mg.  Children, age 35?13: 1,300 mg.  Teens, age 73?18: 1,300 mg.  Adults, age 71?50: 1,000 mg.  Adults, age 6?70: ? Men: 1,000 mg. ? Women: 1,200 mg.  Adults, age 3 or older: 1,200 mg.  Pregnant and breastfeeding females: ? Teens: 1,300 mg. ? Adults: 1,000 mg.  Vitamin D Recommendations Vitamin D is the most essential vitamin for bone health. It helps the body to absorb calcium. Sunlight stimulates the skin to make vitamin D, so be sure to get enough sunlight. If you live in a  cold climate or you do not get outside often, your health care provider may recommend that you take vitamin D supplements. Good sources of vitamin D in your diet include:  Egg yolks.  Saltwater fish.  Milk and cereal fortified with vitamin D.  Follow these recommended amounts for daily vitamin D intake:  Children and teens, age 71?18: 60 international units.  Adults, age 71 or younger: 400-800 international units.  Adults, age 154 or older: 800-1,000 international units.  Other Nutrients Other nutrients for bone health include:  Phosphorus. This mineral is found in meat, poultry, dairy foods, nuts, and legumes. The recommended daily intake for adult men and adult women is 700 mg.  Magnesium. This mineral is found in seeds, nuts, dark green vegetables, and legumes. The recommended daily intake for adult men is 400?420 mg. For adult women, it is 310?320 mg.  Vitamin K. This vitamin is found in green leafy vegetables. The recommended daily intake is 120 mg for adult men and 90 mg for adult women.  What type of physical activity is best for building and maintaining healthy bones? Weight-bearing and strength-building activities are important for building and maintaining peak bone mass. Weight-bearing  activities cause muscles and bones to work against gravity. Strength-building activities increases muscle strength that supports bones. Weight-bearing and muscle-building activities include:  Walking and hiking.  Jogging and running.  Dancing.  Gym exercises.  Lifting weights.  Tennis and racquetball.  Climbing stairs.  Aerobics.  Adults should get at least 30 minutes of moderate physical activity on most days. Children should get at least 60 minutes of moderate physical activity on most days. Ask your health care provide what type of exercise is best for you. Where can I find more information? For more information, check out the following websites:  [employer]: YardHomes.se  [employer] of Health: http://www.niams.AnonymousEar.fr.asp  This information is not intended to replace advice given to you by your health care provider. Make sure you discuss any questions you have with your health care provider. Document Released: 01/05/2004 Document Revised: 05/04/2016 Document Reviewed: 10/20/2014 Elsevier Interactive Patient Education  [employer].

## 2017-09-03 NOTE — Telephone Encounter (Signed)
Called insurance [phone]), spoke with Jovi V. No auth and no predetermination required.   Pt advised and NV appt scheduled. Rx ordered.

## 2017-09-03 NOTE — Telephone Encounter (Signed)
-----   Message from [facility], [place] sent at 09/03/2017  1:35 PM EST ----- Regarding: Prolia Hi Liz Pinho,  I would like to start Prolia for this patient. She is getting BMP done today. Can you order it?  Thanks! Evlyn Clines

## 2017-09-03 NOTE — Progress Notes (Signed)
HPI:                                                                Alice Williams is a 56 y.o. female who presents to [place] [place] today for anxiety and insomnia follow-up  Insomnia  Primary symptoms: frequent awakening.  The current episode started more than one year. The onset quality is undetermined. The problem occurs nightly. The problem has been gradually improving since onset. The symptoms are aggravated by anxiety and Beta blocker use. The symptoms are relieved by medication (Suvorexant 20mg  working). The treatment provided moderate relief. Prior diagnostic workup includes:  No prior workup.  Previous Meds: Ambien, Unisom  Depression/Anxiety: Continues to endorse anxiety, excessive worry, trouble relaxing, and restlessness. She is not currently doing any treatments for this. Denies symptoms of mania/hypomania. Denies suicidal thinking. Denies auditory/visual hallucinations.   Past Medical History:  Diagnosis Date  . Anxiety   . Breast calcifications on mammogram   . Facet arthropathy, lumbosacral 08/28/2017  . GERD (gastroesophageal reflux disease)   . History of colon polyps   . Inappropriate sinus tachycardia   . Migraines   . Osteopenia   . Osteoporosis of femur without pathological fracture 08/28/2017  . Palpitations   . Premature surgical menopause    Past Surgical History:  Procedure Laterality Date  . ANAL FISSURE REPAIR    . APPENDECTOMY    . COLONOSCOPY W/ POLYPECTOMY  03/2015  . KNEE SURGERY    . OOPHORECTOMY    . Stone  . PELVIC LAPAROSCOPY     x 4 for endometriosis   . VAGINAL HYSTERECTOMY     Social History   Tobacco Use  . Smoking status: Former Smoker    Last attempt to quit: 08/09/2006    Years since quitting: 11.0  . Smokeless tobacco: Never Used  Substance Use Topics  . Alcohol use: Yes   family history includes Alcohol abuse in her father; Breast cancer in her mother;  Colon cancer in her mother; Heart attack in her father; Hyperlipidemia in her brother and father.  ROS: negative except as noted in the HPI  Medications: Current Outpatient Medications  Medication Sig Dispense Refill  . aspirin-acetaminophen-caffeine (EXCEDRIN MIGRAINE) 250-250-65 MG tablet Take by mouth.    . Calcium Carbonate-Vitamin D 600-400 MG-UNIT tablet Take 1 tablet by mouth 2 (two) times daily. 60 tablet 11  . hydrOXYzine (ATARAX/VISTARIL) 10 MG tablet Take 1 tablet by mouth 3 (three) times daily as needed.    . nadolol (CORGARD) 20 MG tablet Take 1 tablet by mouth daily.    Marland Kitchen omeprazole (PRILOSEC) 40 MG capsule Take 40 mg daily by mouth.    . Suvorexant 20 MG TABS Take 1 tablet at bedtime by mouth. 30 tablet 3  . tiZANidine (ZANAFLEX) 4 MG capsule Take 1 capsule by mouth 3 (three) times daily as needed.    . topiramate (TOPAMAX) 25 MG tablet Take 3 tablets (75 mg total) by mouth at bedtime. 90 tablet 1   No current facility-administered medications for this visit.    Allergies  Allergen Reactions  . Sulfa Antibiotics Anaphylaxis  . Sumatriptan Succinate Anaphylaxis       Objective:  BP 136/80  Pulse (!) 57   Wt 143 lb (64.9 kg)   BMI 21.74 kg/m  Gen:  alert, not ill-appearing, no distress, appropriate for age 53: head normocephalic without obvious abnormality, conjunctiva and cornea clear, wearing glasses, trachea midline Pulm: Normal work of breathing, normal phonation Neuro: alert and oriented x 3, no tremor MSK: extremities atraumatic, normal gait and station Skin: intact, no rashes on exposed skin, no jaundice, no cyanosis Psych: well-groomed, cooperative, good eye contact, euthymic mood, affect mood-congruent, speech is articulate, and thought processes clear and goal-directed  GAD 7 : Generalized Anxiety Score 09/03/2017  Nervous, Anxious, on Edge 1  Control/stop worrying 3  Worry too much - different things 2  Trouble relaxing 2  Restless 2  Easily  annoyed or irritable 2  Afraid - awful might happen 2  Total GAD 7 Score 14  Anxiety Difficulty Not difficult at all        No results found for this or any previous visit (from the past 72 hour(s)). No results found.    Assessment and Plan: 56 y.o. female with   1. Anxiety disorder, unspecified type - GAD7 scoer 14, moderate, patient reports no difficulty with daily life - discussed treatment options to include SSRI, CBT/counseling, and alternative therapies. Patient declines SSRI or CBT today. She was given information on online counseling, mindfulness apps  2. Insomnia due to other mental disorder - improved on orexin antagonist. I think sleep could improve more so if anxiety was better controlled - Suvorexant 20 MG TABS; Take 1 tablet at bedtime by mouth.  Dispense: 30 tablet; Refill: 3  3. Screening for blood disease - Basic metabolic panel  4. Osteoporosis of femur without pathological fracture - 10 yr major fx risk 12%, hip fx risk 1.7% - patient intolerant to Fosamax and Boniva (GERD symptoms) - she is a candidate for Prolia. Obtaining BMP today. Will contact insurance for prior auth   Patient education and anticipatory guidance given Patient agrees with treatment plan Follow-up in 4 months or sooner as needed if symptoms worsen or fail to improve  Darlyne Russian PA-C

## 2017-09-04 ENCOUNTER — Other Ambulatory Visit: Payer: 59

## 2017-09-04 LAB — BASIC METABOLIC PANEL
BUN: 20 mg/dL (ref 7–25)
CALCIUM: 9.1 mg/dL (ref 8.6–10.4)
CHLORIDE: 114 mmol/L — AB (ref 98–110)
CO2: 23 mmol/L (ref 20–32)
Creat: 0.94 mg/dL (ref 0.50–1.05)
Glucose, Bld: 80 mg/dL (ref 65–99)
POTASSIUM: 3.9 mmol/L (ref 3.5–5.3)
SODIUM: 145 mmol/L (ref 135–146)

## 2017-09-06 ENCOUNTER — Ambulatory Visit: Payer: 59 | Admitting: Rehabilitative and Restorative Service Providers"

## 2017-09-06 ENCOUNTER — Ambulatory Visit: Payer: 59

## 2017-09-06 ENCOUNTER — Other Ambulatory Visit: Payer: Self-pay

## 2017-09-06 ENCOUNTER — Ambulatory Visit (INDEPENDENT_AMBULATORY_CARE_PROVIDER_SITE_OTHER): Payer: 59 | Admitting: Physician Assistant

## 2017-09-06 VITALS — BP 104/61 | HR 64

## 2017-09-06 DIAGNOSIS — R29898 Other symptoms and signs involving the musculoskeletal system: Secondary | ICD-10-CM

## 2017-09-06 DIAGNOSIS — R293 Abnormal posture: Secondary | ICD-10-CM

## 2017-09-06 DIAGNOSIS — M810 Age-related osteoporosis without current pathological fracture: Secondary | ICD-10-CM | POA: Diagnosis not present

## 2017-09-06 DIAGNOSIS — M533 Sacrococcygeal disorders, not elsewhere classified: Secondary | ICD-10-CM

## 2017-09-06 MED ORDER — DENOSUMAB 60 MG/ML ~~LOC~~ SOLN
60.0000 mg | Freq: Once | SUBCUTANEOUS | Status: AC
  Administered 2017-09-06: 60 mg via SUBCUTANEOUS

## 2017-09-06 NOTE — Patient Instructions (Signed)
Trunk: Prone Extension (Press-Ups)    Lie on stomach on firm, flat surface. Relax bottom and legs. Raise chest in air with elbows straight. Keep hips flat on surface, sag stomach. Hold _2-3___ seconds. Repeat __10__ times. Do __2-3__ sessions per day. CAUTION: Movement should be gentle and slow.  HIP: Hamstrings - Supine  Place strap around foot. Raise leg up, keeping knee straight.  Bend opposite knee to protect back if indicated. Hold 30 seconds. 3 reps per set, 2-3 sets per day   Quads / HF, Prone KNEE: Quadriceps - Prone    Place strap around ankle. Bring ankle toward buttocks. Press hip into surface. Hold 30 seconds. Repeat 3 times per session. Do 2-3 sessions per day.  Abdominal Bracing With Pelvic Floor (Hook-Lying)    With neutral spine, tighten pelvic floor and abdominals sucking bellybutton to back bone; tighten muscles in low back at waist; Hold; slowly exhale. Hold 10 sec  Repeat _10__ times. Do _several__ times a day. Progress to do this in sitting; standing; walking and with functional activities   Sleeping on Back  Place pillow under knees. A pillow with cervical support and a roll around waist are also helpful. Copyright  VHI. All rights reserved.  Sleeping on Side Place pillow between knees. Use cervical support under neck and a roll around waist as needed. Copyright  VHI. All rights reserved.   Sleeping on Stomach   If this is the only desirable sleeping position, place pillow under lower legs, and under stomach or chest as needed.  Posture - Sitting   Sit upright, head facing forward. Try using a roll to support lower back. Keep shoulders relaxed, and avoid rounded back. Keep hips level with knees. Avoid crossing legs for long periods. Stand to Sit / Sit to Stand   To sit: Bend knees to lower self onto front edge of chair, then scoot back on seat. To stand: Reverse sequence by placing one foot forward, and scoot to front of seat. Use rocking  motion to stand up.   Work Height and Reach  Ideal work height is no more than 2 to 4 inches below elbow level when standing, and at elbow level when sitting. Reaching should be limited to arm's length, with elbows slightly bent.  Bending  Bend at hips and knees, not back. Keep feet shoulder-width apart.    Posture - Standing   Good posture is important. Avoid slouching and forward head thrust. Maintain curve in low back and align ears over shoul- ders, hips over ankles.  Alternating Positions   Alternate tasks and change positions frequently to reduce fatigue and muscle tension. Take rest breaks. Computer Work   Position work to Programmer, multimedia. Use proper work and seat height. Keep shoulders back and down, wrists straight, and elbows at right angles. Use chair that provides full back support. Add footrest and lumbar roll as needed.  Getting Into / Out of Car  Lower self onto seat, scoot back, then bring in one leg at a time. Reverse sequence to get out.  Dressing  Lie on back to pull socks or slacks over feet, or sit and bend leg while keeping back straight.    Housework - Sink  Place one foot on ledge of cabinet under sink when standing at sink for prolonged periods.   Pushing / Pulling  Pushing is preferable to pulling. Keep back in proper alignment, and use leg muscles to do the work.  Deep Squat   Squat and lift with  both arms held against upper trunk. Tighten stomach muscles without holding breath. Use smooth movements to avoid jerking.  Avoid Twisting   Avoid twisting or bending back. Pivot around using foot movements, and bend at knees if needed when reaching for articles.  Carrying Luggage   Distribute weight evenly on both sides. Use a cart whenever possible. Do not twist trunk. Move body as a unit.   Lifting Principles .Maintain proper posture and head alignment. .Slide object as close as possible before lifting. .Move obstacles out of the  way. .Test before lifting; ask for help if too heavy. .Tighten stomach muscles without holding breath. .Use smooth movements; do not jerk. .Use legs to do the work, and pivot with feet. .Distribute the work load symmetrically and close to the center of trunk. .Push instead of pull whenever possible.   Ask For Help   Ask for help and delegate to others when possible. Coordinate your movements when lifting together, and maintain the low back curve.  Log Roll   Lying on back, bend left knee and place left arm across chest. Roll all in one movement to the right. Reverse to roll to the left. Always move as one unit. Housework - Sweeping  Use long-handled equipment to avoid stooping.   Housework - Wiping  Position yourself as close as possible to reach work surface. Avoid straining your back.  Laundry - Unloading Wash   To unload small items at bottom of washer, lift leg opposite to arm being used to reach.  Drain close to area to be raked. Use arm movements to do the work. Keep back straight and avoid twisting.     Cart  When reaching into cart with one arm, lift opposite leg to keep back straight.   Getting Into / Out of Bed  Lower self to lie down on one side by raising legs and lowering head at the same time. Use arms to assist moving without twisting. Bend both knees to roll onto back if desired. To sit up, start from lying on side, and use same move-ments in reverse. Housework - Vacuuming  Hold the vacuum with arm held at side. Step back and forth to move it, keeping head up. Avoid twisting.   Laundry - IT consultant so that bending and twisting can be avoided.   Laundry - Unloading Dryer  Squat down to reach into clothes dryer or use a reacher.  Gardening - Weeding / Probation officer or Kneel. Knee pads may be helpful.
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
## 2017-09-06 NOTE — Progress Notes (Signed)
Pt given first Prolia injection today.  Tolerated well in left arm.

## 2017-09-12 ENCOUNTER — Telehealth: Payer: Self-pay | Admitting: *Deleted

## 2017-09-12 NOTE — Telephone Encounter (Signed)
Pre Authorization sent to cover my meds. [place]

## 2017-09-17 NOTE — Telephone Encounter (Signed)
belsomra has been approved by insurance. Called [facility] to let them know. Patient notified

## 2017-09-26 MED FILL — BELSOMRA 20 MG TABLET: 20 | 30 days supply | Qty: 30 | Fill #0

## 2017-10-09 ENCOUNTER — Ambulatory Visit: Payer: 59 | Admitting: Family Medicine

## 2017-10-09 DIAGNOSIS — Z0189 Encounter for other specified special examinations: Secondary | ICD-10-CM

## 2017-10-14 ENCOUNTER — Other Ambulatory Visit: Payer: Self-pay | Admitting: Physician Assistant

## 2017-10-14 DIAGNOSIS — IMO0002 Reserved for concepts with insufficient information to code with codable children: Secondary | ICD-10-CM

## 2017-10-14 DIAGNOSIS — G43709 Chronic migraine without aura, not intractable, without status migrainosus: Secondary | ICD-10-CM

## 2017-11-04 ENCOUNTER — Other Ambulatory Visit: Payer: Self-pay | Admitting: Physician Assistant

## 2017-11-04 ENCOUNTER — Encounter: Payer: Self-pay | Admitting: Physician Assistant

## 2017-11-04 MED ORDER — NADOLOL 20 MG PO TABS: 20.0000 mg | ORAL_TABLET | Freq: Every day | ORAL | 0 refills | Status: DC

## 2017-11-04 MED FILL — TOPIRAMATE 25 MG TAB: 25 | 30 days supply | Qty: 90 | Fill #0

## 2017-11-04 NOTE — Telephone Encounter (Signed)
Historical provider. On 12/12/218 Wednesday we were open.

## 2017-11-05 MED FILL — NADOLOL 20 MG TAB: 20 | 90 days supply | Qty: 90 | Fill #0

## 2017-11-19 ENCOUNTER — Ambulatory Visit (INDEPENDENT_AMBULATORY_CARE_PROVIDER_SITE_OTHER): Payer: 59 | Admitting: Neurology

## 2017-11-19 ENCOUNTER — Encounter: Payer: Self-pay | Admitting: Neurology

## 2017-11-19 VITALS — BP 108/64 | HR 72 | Ht 65.0 in | Wt 144.6 lb

## 2017-11-19 DIAGNOSIS — G43719 Chronic migraine without aura, intractable, without status migrainosus: Secondary | ICD-10-CM | POA: Diagnosis not present

## 2017-11-19 MED ORDER — DICLOFENAC POTASSIUM(MIGRAINE) 50 MG PO PACK: 50.0000 mg | PACK | ORAL | 3 refills | Status: DC

## 2017-11-19 MED ORDER — DICLOFENAC POTASSIUM(MIGRAINE) 50 MG PO PACK: 50.0000 mg | PACK | ORAL | 0 refills | Status: DC

## 2017-11-19 MED ORDER — TOPIRAMATE 100 MG PO TABS: 100.0000 mg | ORAL_TABLET | Freq: Every day | ORAL | 3 refills | Status: DC

## 2017-11-19 MED FILL — TOPIRAMATE 100 MG TABLET: 100 | 45 days supply | Qty: 45 | Fill #0

## 2017-11-19 NOTE — Progress Notes (Signed)
NEUROLOGY CONSULTATION NOTE  Alice Williams MRN: [id number] DOB: 1961/10/02  Referring provider: Nelson Chimes, PA-C Primary care provider: Nelson Chimes, PA-C  Reason for consult:  Migraines.  HISTORY OF PRESENT ILLNESS: Alice Williams is a 57 year old female with migraine, anxiety, who presents for migraines.  History supplemented by prior neurologist's notes.  Onset:  In her 37s, she was initially diagnosed with cluster headache.  She had right sided parietal headaches.  In her 77s, headaches returned and associated with nausea and vomiting.  They would start at base of her head on the right, radiating to the front.  In her 65s, she developed associated nystagmus with her migraines.  Since age 85, she has had variable headaches, now left sided as well. She has a constant headache but will have episodes of severe headache. Location:  Right or left sided Quality:  throbbing Intensity:  severe Aura:  no Prodrome:  no Postdrome:  no Associated symptoms:  photophobia, phonophobia.  Sometimes nausea or nystagmus.  Sometimes when she has a headache, she feels like she is leaning to one side.  No vomiting, autonomic symptoms or visual disturbance.  She has not had any new worse headache of her life, waking up from sleep Duration:  3 days Frequency:  5 to 10 days a month. Frequency of abortive medication: daily Triggers/exacerbating factors:  Stress, certain smells (perfumes, lotions), laying in bed, sleep Relieving factors:  no Activity:  aggravates  Past NSAIDS:  Indomethacin, ibuprofen Past analgesics:  Tylenol Past abortive triptans:  Sumatriptan (anaphylactic reaction- throat swollen, severe headache) Past muscle relaxants:  Flexeril Past anti-emetic:  Reglan 10mg , promethazine Past antihypertensive medications:  propranolol Past antidepressant medications:  amitriptyline 100mg , Effexor Past anticonvulsant medications:  gabapentin Past vitamins/Herbal/Supplements:   magnesium Past antihistamines/decongestants:  no Other past therapies:  no  Current NSAIDS:  Aleve (daily) Current analgesics:  Excedrin Migraine (daily) Current triptans:  no Current anti-emetic:  no Current muscle relaxants:  tizanidine 4mg  (ineffective) Current anti-anxiolytic:  hydroxyzine Current sleep aide:  no Current Antihypertensive medications:  nadolol Current Antidepressant medications:  no Current Anticonvulsant medications:  topiramate 75mg  Current Vitamins/Herbal/Supplements:  magnesium Current Antihistamines/Decongestants:  no Other therapy:  no  Caffeine:  1 soda daily to treat headache Alcohol:  no Smoker:  no Diet:  Tries to eat healthy.  Does not hydrate with water Exercise:  No Depression/anxiety:  anxiety Sleep hygiene:  poor Family history of headache:  Mother (migraines)  09/03/17:  Na 145, K 3.9, glucose 80, BUN 20, Cr 0.94  PAST MEDICAL HISTORY: Past Medical History:  Diagnosis Date  . Anxiety   . Breast calcifications on mammogram   . Facet arthropathy, lumbosacral 08/28/2017  . GERD (gastroesophageal reflux disease)   . History of colon polyps   . Inappropriate sinus tachycardia   . Migraines   . Osteopenia   . Osteoporosis of femur without pathological fracture 08/28/2017  . Palpitations   . Premature surgical menopause     PAST SURGICAL HISTORY: Past Surgical History:  Procedure Laterality Date  . ANAL FISSURE REPAIR    . APPENDECTOMY    . COLONOSCOPY W/ POLYPECTOMY  03/2015  . KNEE SURGERY    . OOPHORECTOMY    . Lane  . PELVIC LAPAROSCOPY     x 4 for endometriosis   . VAGINAL HYSTERECTOMY      MEDICATIONS: Current Outpatient Medications on File Prior to Visit  Medication Sig Dispense Refill  . aspirin-acetaminophen-caffeine ([place]) [id number]  MG tablet Take by mouth.    . Calcium Carbonate-Vitamin D 600-400 MG-UNIT tablet Take 1 tablet by mouth 2 (two) times daily. 60 tablet 11  .  hydrOXYzine (ATARAX/VISTARIL) 10 MG tablet Take 1 tablet by mouth 3 (three) times daily as needed.    . nadolol (CORGARD) 20 MG tablet Take 1 tablet (20 mg total) by mouth daily. 90 tablet 0  . omeprazole (PRILOSEC) 40 MG capsule Take 40 mg daily by mouth.    . Suvorexant 20 MG TABS Take 1 tablet at bedtime by mouth. 30 tablet 3  . tiZANidine (ZANAFLEX) 4 MG capsule Take 1 capsule by mouth 3 (three) times daily as needed.    . topiramate (TOPAMAX) 25 MG tablet TAKE 3 TABLETS (75 MG TOTAL) BY MOUTH AT BEDTIME. 90 tablet 1   No current facility-administered medications on file prior to visit.     ALLERGIES: Allergies  Allergen Reactions  . Sulfa Antibiotics Anaphylaxis  . Sumatriptan Succinate Anaphylaxis    FAMILY HISTORY: Family History  Problem Relation Age of Onset  . Colon cancer Mother   . Breast cancer Mother   . Hyperlipidemia Father   . Alcohol abuse Father   . Heart attack Father   . Hyperlipidemia Brother   . Lung cancer Maternal Grandmother   . Parkinson's disease Maternal Grandfather   . Dementia Maternal Grandfather   . Parkinson's disease Paternal Grandmother   . Heart attack Paternal Grandfather     SOCIAL HISTORY: Social History   Socioeconomic History  . Marital status: Divorced    Spouse name: Not on file  . Number of children: Not on file  . Years of education: Not on file  . Highest education level: Master's degree (e.g., MA, MS, MEng, MEd, MSW, MBA)  Social Needs  . Financial resource strain: Not on file  . Food insecurity - worry: Not on file  . Food insecurity - inability: Not on file  . Transportation needs - medical: Not on file  . Transportation needs - non-medical: Not on file  Occupational History  . Occupation: Therapist, sports  Tobacco Use  . Smoking status: Former Smoker    Last attempt to quit: 08/09/2006    Years since quitting: 11.2  . Smokeless tobacco: Never Used  Substance and Sexual Activity  . Alcohol use: Yes  . Drug use: No  . Sexual  activity: No    Birth control/protection: Surgical  Other Topics Concern  . Not on file  Social History Narrative   Single, livevs alone in a 2 story house. Drinks one soda a day. Does not exercise regularly.    REVIEW OF SYSTEMS: Constitutional: No fevers, chills, or sweats, no generalized fatigue, change in appetite Eyes: No visual changes, double vision, eye pain Ear, nose and throat: No hearing loss, ear pain, nasal congestion, sore throat Cardiovascular: No chest pain, palpitations Respiratory:  No shortness of breath at rest or with exertion, wheezes GastrointestinaI: No nausea, vomiting, diarrhea, abdominal pain, fecal incontinence Genitourinary:  No dysuria, urinary retention or frequency Musculoskeletal:  No neck pain, back pain Integumentary: No rash, pruritus, skin lesions Neurological: as above Psychiatric: No depression, insomnia, anxiety Endocrine: No palpitations, fatigue, diaphoresis, mood swings, change in appetite, change in weight, increased thirst Hematologic/Lymphatic:  No purpura, petechiae. Allergic/Immunologic: no itchy/runny eyes, nasal congestion, recent allergic reactions, rashes  PHYSICAL EXAM: Vitals:   11/19/17 0800  BP: 108/64  Pulse: 72  SpO2: 97%   General: No acute distress.  Patient appears well-groomed.   Head:  Normocephalic/atraumatic Eyes:  fundi examined but not visualized Neck: supple, no paraspinal tenderness, full range of motion Back: No paraspinal tenderness Heart: regular rate and rhythm Lungs: Clear to auscultation bilaterally. Vascular: No carotid bruits. Neurological Exam: Mental status: alert and oriented to person, place, and time, recent and remote memory intact, fund of knowledge intact, attention and concentration intact, speech fluent and not dysarthric, language intact. Cranial nerves: CN I: not tested CN II: pupils equal, round and reactive to light, visual fields intact CN III, IV, VI:  full range of motion, no  nystagmus, no ptosis CN V: facial sensation intact CN VII: upper and lower face symmetric CN VIII: hearing intact CN IX, X: gag intact, uvula midline CN XI: sternocleidomastoid and trapezius muscles intact CN XII: tongue midline Bulk & Tone: normal, no fasciculations. Motor:  5/5 throughout  Sensation: temperature and vibration sensation intact. Deep Tendon Reflexes:  2+ throughout, toes downgoing.  Finger to nose testing:  Without dysmetria.  Heel to shin:  Without dysmetria.  Gait:  Normal station and stride.  Able to turn and tandem walk. Romberg negative.  IMPRESSION: Chronic migraine  PLAN: 1.  She has had daily headaches for many years and has failed several preventatives.  She meets criteria for Botox.  Will submit for pre-authorization and schedule. 2.  In meantime, will increase topiramate to 100mg  at bedtime. 3.  Advised to stop Aleve and Excedrin.  Try Cambia, limited to no more than 2 days out of week 4.  Exercise, sleep hygiene, hydration and stop caffeine recommended. 5.  Follow up for Botox.  Thank you for allowing me to take part in the care of this patient.  Metta Clines, DO  CC:  Nelson Chimes, PA-C

## 2017-11-19 NOTE — Patient Instructions (Signed)
Migraine Recommendations: 1.  We will submit pre-authorization to set you up for Botox.  In the meantime, I will increase topiramate to 100mg  at bedtime. 2.  Stop Aleve and Excedrin.  Instead, try Cambia.  Limit to no more than once in 24 hours. 3.  Limit use of pain relievers to no more than 2 days out of the week.  These medications include acetaminophen, ibuprofen, triptans and narcotics.  This will help reduce risk of rebound headaches. 4.  Be aware of common food triggers such as processed sweets, processed foods with nitrites (such as deli meat, hot dogs, sausages), foods with MSG, alcohol (such as wine), chocolate, certain cheeses, certain fruits (dried fruits, bananas, some citrus fruit), vinegar, diet soda. 4.  Avoid caffeine 5.  Routine exercise 6.  Proper sleep hygiene 7.  Stay adequately hydrated with water 8.  Keep a headache diary. 9.  Maintain proper stress management. 10.  Do not skip meals. 11.  Consider supplements:  Magnesium citrate 400mg  to 600mg  daily, riboflavin 400mg , Coenzyme Q 10 100mg  three times daily 12.  Follow up for Botox

## 2017-11-20 MED FILL — BELSOMRA 20 MG TABLET: 20 | 30 days supply | Qty: 30 | Fill #1

## 2017-11-29 ENCOUNTER — Encounter: Payer: Self-pay | Admitting: Physician Assistant

## 2017-12-06 ENCOUNTER — Ambulatory Visit (INDEPENDENT_AMBULATORY_CARE_PROVIDER_SITE_OTHER): Payer: 59 | Admitting: Physician Assistant

## 2017-12-06 ENCOUNTER — Encounter: Payer: Self-pay | Admitting: Physician Assistant

## 2017-12-06 VITALS — BP 114/70 | HR 64 | Wt 144.0 lb

## 2017-12-06 DIAGNOSIS — F411 Generalized anxiety disorder: Secondary | ICD-10-CM

## 2017-12-06 DIAGNOSIS — F419 Anxiety disorder, unspecified: Secondary | ICD-10-CM

## 2017-12-06 DIAGNOSIS — F5105 Insomnia due to other mental disorder: Secondary | ICD-10-CM

## 2017-12-06 MED ORDER — SERTRALINE HCL 50 MG PO TABS
50.0000 mg | ORAL_TABLET | Freq: Every day | ORAL | 3 refills | Status: DC
Start: 2017-12-06 — End: 2018-03-27

## 2017-12-06 MED FILL — SERTRALINE HCL 50 MG TABLET: 50 | 30 days supply | Qty: 30 | Fill #0

## 2017-12-06 NOTE — Patient Instructions (Addendum)
- start Sertraline 1/2 tab daily for 3 days, then full tab daily - if you feel any drowsiness, take it at bedtime - continue following up with counseling - continue Suvorexant at bedtime - continue sleep hygiene  Sleep Hygiene . Limiting daytime naps to 30 minutes . Napping does not make up for inadequate nighttime sleep. However, a short nap of 20-30 minutes can help to improve mood, alertness and performance.  . Avoiding stimulants such as  caffeine and nicotine close to bedtime.  And when it comes to alcohol, moderation is key 4. While alcohol is well-known to help you fall asleep faster, too much close to bedtime can disrupt sleep in the second half of the night as the body begins to process the alcohol.    . Exercising to promote good quality sleep.  As little as 10 minutes of aerobic exercise, such as walking or cycling, can drastically improve nighttime sleep quality.  For the best night's sleep, most people should avoid strenuous workouts close to bedtime. However, the effect of intense nighttime exercise on sleep differs from person to person, so find out what works best for you.   . Steering clear of food that can be disruptive right before sleep.   Heavy or rich foods, fatty or fried meals, spicy dishes, citrus fruits, and carbonated drinks can trigger indigestion for some people. When this occurs close to bedtime, it can lead to painful heartburn that disrupts sleep. . Ensuring adequate exposure to natural light.  This is particularly important for individuals who may not venture outside frequently. Exposure to sunlight during the day, as well as darkness at night, helps to maintain a healthy sleep-wake cycle . Marland Kitchen Establishing a regular relaxing bedtime routine.  A regular nightly routine helps the body recognize that it is bedtime. This could include taking warm shower or bath, reading a book, or light stretches. When possible, try to avoid emotionally upsetting conversations and activities  before attempting to sleep. . Making sure that the sleep environment is pleasant.  Mattress and pillows should be comfortable. The bedroom should be cool - between 60 and 67 degrees - for optimal sleep. Bright light from lamps, cell phone and TV screens can make it difficult to fall asleep4, so turn those light off or adjust them when possible. Consider using blackout curtains, eye shades, ear plugs, "white noise" machines, humidifiers, fans and other devices that can make the bedroom more relaxing.     Living With Anxiety After being diagnosed with an anxiety disorder, you may be relieved to know why you have felt or behaved a certain way. It is natural to also feel overwhelmed about the treatment ahead and what it will mean for your life. With care and support, you can manage this condition and recover from it. How to cope with anxiety Dealing with stress Stress is your body's reaction to life changes and events, both good and bad. Stress can last just a few hours or it can be ongoing. Stress can play a major role in anxiety, so it is important to learn both how to cope with stress and how to think about it differently. Talk with your health care provider or a counselor to learn more about stress reduction. He or she may suggest some stress reduction techniques, such as:  Music therapy. This can include creating or listening to music that you enjoy and that inspires you.  Mindfulness-based meditation. This involves being aware of your normal breaths, rather than trying to control  your breathing. It can be done while sitting or walking.  Centering prayer. This is a kind of meditation that involves focusing on a word, phrase, or sacred image that is meaningful to you and that brings you peace.  Deep breathing. To do this, expand your stomach and inhale slowly through your nose. Hold your breath for 3-5 seconds. Then exhale slowly, allowing your stomach muscles to relax.  Self-talk. This is a  skill where you identify thought patterns that lead to anxiety reactions and correct those thoughts.  Muscle relaxation. This involves tensing muscles then relaxing them.  Choose a stress reduction technique that fits your lifestyle and personality. Stress reduction techniques take time and practice. Set aside 5-15 minutes a day to do them. Therapists can offer training in these techniques. The training may be covered by some insurance plans. Other things you can do to manage stress include:  Keeping a stress diary. This can help you learn what triggers your stress and ways to control your response.  Thinking about how you respond to certain situations. You may not be able to control everything, but you can control your reaction.  Making time for activities that help you relax, and not feeling guilty about spending your time in this way.  Therapy combined with coping and stress-reduction skills provides the best chance for successful treatment. Medicines Medicines can help ease symptoms. Medicines for anxiety include:  Anti-anxiety drugs.  Antidepressants.  Beta-blockers.  Medicines may be used as the main treatment for anxiety disorder, along with therapy, or if other treatments are not working. Medicines should be prescribed by a health care provider. Relationships Relationships can play a big part in helping you recover. Try to spend more time connecting with trusted friends and family members. Consider going to couples counseling, taking family education classes, or going to family therapy. Therapy can help you and others better understand the condition. How to recognize changes in your condition Everyone has a different response to treatment for anxiety. Recovery from anxiety happens when symptoms decrease and stop interfering with your daily activities at home or work. This may mean that you will start to:  Have better concentration and focus.  Sleep better.  Be less  irritable.  Have more energy.  Have improved memory.  It is important to recognize when your condition is getting worse. Contact your health care provider if your symptoms interfere with home or work and you do not feel like your condition is improving. Where to find help and support: You can get help and support from these sources:  Self-help groups.  Online and [employer].  A trusted spiritual leader.  Couples counseling.  Family education classes.  Family therapy.  Follow these instructions at home:  Eat a healthy diet that includes plenty of vegetables, fruits, whole grains, low-fat dairy products, and lean protein. Do not eat a lot of foods that are high in solid fats, added sugars, or salt.  Exercise. Most adults should do the following: ? Exercise for at least 150 minutes each week. The exercise should increase your heart rate and make you sweat (moderate-intensity exercise). ? Strengthening exercises at least twice a week.  Cut down on caffeine, tobacco, alcohol, and other potentially harmful substances.  Get the right amount and quality of sleep. Most adults need 7-9 hours of sleep each night.  Make choices that simplify your life.  Take over-the-counter and prescription medicines only as told by your health care provider.  Avoid caffeine, alcohol, and certain  over-the-counter cold medicines. These may make you feel worse. Ask your pharmacist which medicines to avoid.  Keep all follow-up visits as told by your health care provider. This is important. Questions to ask your health care provider  Would I benefit from therapy?  How often should I follow up with a health care provider?  How long do I need to take medicine?  Are there any long-term side effects of my medicine?  Are there any alternatives to taking medicine? Contact a health care provider if:  You have a hard time staying focused or finishing daily tasks.  You spend many hours a  day feeling worried about everyday life.  You become exhausted by worry.  You start to have headaches, feel tense, or have nausea.  You urinate more than normal.  You have diarrhea. Get help right away if:  You have a racing heart and shortness of breath.  You have thoughts of hurting yourself or others. If you ever feel like you may hurt yourself or others, or have thoughts about taking your own life, get help right away. You can go to your nearest emergency department or call:  Your local emergency services (911 in the [place]  A suicide crisis helpline, such as the [facility] at 415-838-9824. This is open 24-hours a day.  Summary  Taking steps to deal with stress can help calm you.  Medicines cannot cure anxiety disorders, but they can help ease symptoms.  Family, friends, and partners can play a big part in helping you recover from an anxiety disorder. This information is not intended to replace advice given to you by your health care provider. Make sure you discuss any questions you have with your health care provider. Document Released: 10/09/2016 Document Revised: 10/09/2016 Document Reviewed: 10/09/2016 Elsevier Interactive Patient Education  [employer].

## 2017-12-06 NOTE — Progress Notes (Signed)
HPI:                                                                Alice Williams is a 57 y.o. female who presents to [facility] today for anxiety follow-up  Anxiety: started seeing a counselor through her employer 1 month ago. States counselor recommended medication for anxiety/depression. Complains that brain is always "going." Reports history of phobias (bridges, claustrophobia) and panic attacks, but has not had these in years.   Prior meds: Paxil, Wellbutrin  Insomnia: continues to endorse nighttime awakenings and premature awakening. States thoughts are racing and she just can't turn off her brain to sleep. Denies restless legs, paresthesias, or pain.  Depression screen [facility] 2/9 12/06/2017 08/09/2017  Decreased Interest 0 2  Down, Depressed, Hopeless 0 2  PHQ - 2 Score 0 4  Altered sleeping 3 3  Tired, decreased energy 2 3  Change in appetite 0 1  Feeling bad or failure about yourself  0 0  Trouble concentrating 0 1  Moving slowly or fidgety/restless 0 0  Suicidal thoughts 0 0  PHQ-9 Score 5 12  Difficult doing work/chores Somewhat difficult -    GAD 7 : Generalized Anxiety Score 12/06/2017 09/03/2017  Nervous, Anxious, on Edge 0 1  Control/stop worrying 2 3  Worry too much - different things 2 2  Trouble relaxing 2 2  Restless 0 2  Easily annoyed or irritable 2 2  Afraid - awful might happen 0 2  Total GAD 7 Score 8 14  Anxiety Difficulty Not difficult at all Not difficult at all      Past Medical History:  Diagnosis Date  . Anxiety   . Breast calcifications on mammogram   . Facet arthropathy, lumbosacral 08/28/2017  . GERD (gastroesophageal reflux disease)   . History of colon polyps   . Inappropriate sinus tachycardia   . Migraines   . Osteopenia   . Osteoporosis of femur without pathological fracture 08/28/2017  . Palpitations   . Premature surgical menopause    Past Surgical History:  Procedure Laterality  Date  . ANAL FISSURE REPAIR    . APPENDECTOMY    . COLONOSCOPY W/ POLYPECTOMY  03/2015  . KNEE SURGERY    . OOPHORECTOMY    . [place]  . PELVIC LAPAROSCOPY     x 4 for endometriosis   . VAGINAL HYSTERECTOMY     Social History   Tobacco Use  . Smoking status: Former Smoker    Last attempt to quit: 08/09/2006    Years since quitting: 11.3  . Smokeless tobacco: Never Used  Substance Use Topics  . Alcohol use: Yes   family history includes Alcohol abuse in her father; Breast cancer in her mother; Colon cancer in her mother; Dementia in her maternal grandfather; Heart attack in her father and paternal grandfather; Hyperlipidemia in her brother and father; Lung cancer in her maternal grandmother; Parkinson's disease in her maternal grandfather and paternal grandmother.    ROS: negative except as noted in the HPI  Medications: Current Outpatient Medications  Medication Sig Dispense Refill  . aspirin-acetaminophen-caffeine (EXCEDRIN MIGRAINE) 250-250-65 MG tablet Take by mouth.    . Calcium Carbonate-Vitamin D 600-400 MG-UNIT tablet  Take 1 tablet by mouth 2 (two) times daily. 60 tablet 11  . Coenzyme Q10 (CO Q 10 PO) Take by mouth.    . Cyanocobalamin (B-12 PO) Take by mouth.    . Diclofenac Potassium (CAMBIA) 50 MG PACK Take 50 mg by mouth as directed. 1 each 0  . MAGNESIUM PO Take by mouth.    . nadolol (CORGARD) 20 MG tablet Take 1 tablet (20 mg total) by mouth daily. 90 tablet 0  . omeprazole (PRILOSEC) 40 MG capsule Take 40 mg daily by mouth.    . Suvorexant 20 MG TABS Take 1 tablet at bedtime by mouth. 30 tablet 3  . tiZANidine (ZANAFLEX) 4 MG capsule Take 1 capsule by mouth 3 (three) times daily as needed.    . topiramate (TOPAMAX) 100 MG tablet Take 1 tablet (100 mg total) by mouth at bedtime. 45 tablet 3  . sertraline (ZOLOFT) 50 MG tablet Take 1 tablet (50 mg total) by mouth daily. 30 tablet 3   No current facility-administered medications for this  visit.    Allergies  Allergen Reactions  . Sulfa Antibiotics Anaphylaxis  . Sumatriptan Succinate Anaphylaxis       Objective:  BP 114/70   Pulse 64   Wt 144 lb (65.3 kg)   BMI 23.96 kg/m  Gen:  alert, not ill-appearing, no distress, appropriate for age 70: head normocephalic without obvious abnormality, conjunctiva and cornea clear, wearing glasses, trachea midline Pulm: Normal work of breathing, normal phonation Neuro: alert and oriented x 3, no tremor MSK: extremities atraumatic, normal gait and station Skin: intact, no rashes on exposed skin, no jaundice, no cyanosis Psych: well-groomed, cooperative, good eye contact, euthymic mood, affect mood-congruent, speech is articulate, and thought processes clear and goal-directed    No results found for this or any previous visit (from the past 72 hour(s)). No results found.    Assessment and Plan: 57 y.o. female with   1. GAD (generalized anxiety disorder) - GAD7 score 8, mildly-moderate, PHQ2=0 no acute safety issues - starting SSRI - keep follow-up with counselor - sertraline (ZOLOFT) 50 MG tablet; Take 1 tablet (50 mg total) by mouth daily.  Dispense: 30 tablet; Refill: 3  2. Insomnia secondary to anxiety - likely to improve if we can control anxiety better - continue Belsomra QHS for now. If no improvement in 4 weeks, we will discuss medication change - counseled on sleep hygiene     Patient education and anticipatory guidance given Patient agrees with treatment plan Follow-up as needed if symptoms worsen or fail to improve  I spent 25 minutes with this patient, greater than 50% was face-to-face time counseling regarding the above diagnoses   Darlyne Russian PA-C

## 2017-12-10 ENCOUNTER — Encounter: Payer: Self-pay | Admitting: Physician Assistant

## 2017-12-13 ENCOUNTER — Encounter: Payer: Self-pay | Admitting: Physician Assistant

## 2017-12-16 NOTE — Progress Notes (Signed)
Sent botox PA online through cover my meds.

## 2017-12-17 NOTE — Progress Notes (Signed)
Rcvd approval for Botox PA# [id number] valid 12/17/17-12/16/18 for 4 fills, one, 200u vial Q33mo. Must be filled at [facility]. O/P  Pharmacy, called and left Rx on their VM. Called and spoke with Pt and advsd to contact pharmacy first thing in the morning to ensure Botox is ordered and can p/u for Fridays (12/20/17) Botox clinic.

## 2017-12-18 MED FILL — BOTOX 200 UNITS VIAL: 200 | 90 days supply | Qty: 1 | Fill #0

## 2017-12-20 ENCOUNTER — Ambulatory Visit (INDEPENDENT_AMBULATORY_CARE_PROVIDER_SITE_OTHER): Payer: 59 | Admitting: Neurology

## 2017-12-20 DIAGNOSIS — G43719 Chronic migraine without aura, intractable, without status migrainosus: Secondary | ICD-10-CM

## 2017-12-20 DIAGNOSIS — G43709 Chronic migraine without aura, not intractable, without status migrainosus: Secondary | ICD-10-CM

## 2017-12-20 MED ORDER — ONABOTULINUMTOXINA 100 UNITS IJ SOLR
200.0000 [IU] | Freq: Once | INTRAMUSCULAR | Status: AC
  Administered 2017-12-20: 200 [IU] via INTRAMUSCULAR

## 2017-12-20 NOTE — Progress Notes (Signed)
Botulinum Clinic  ° °Procedure Note Botox ° °Attending: Dr. Adam Jaffe ° °Preoperative Diagnosis(es): Chronic migraine ° °Consent obtained from: The patient °Benefits discussed included, but were not limited to decreased muscle tightness, increased joint range of motion, and decreased pain.  Risk discussed included, but were not limited pain and discomfort, bleeding, bruising, excessive weakness, venous thrombosis, muscle atrophy and dysphagia.  Anticipated outcomes of the procedure as well as he risks and benefits of the alternatives to the procedure, and the roles and tasks of the personnel to be involved, were discussed with the patient, and the patient consents to the procedure and agrees to proceed. A copy of the patient medication guide was given to the patient which explains the blackbox warning. ° °Patients identity and treatment sites confirmed Yes.  . ° °Details of Procedure: °Skin was cleaned with alcohol. Prior to injection, the needle plunger was aspirated to make sure the needle was not within a blood vessel.  There was no blood retrieved on aspiration.   ° °Following is a summary of the muscles injected  And the amount of Botulinum toxin used: ° °Dilution °200 units of Botox was reconstituted with 4 ml of preservative free normal saline. °Time of reconstitution: At the time of the office visit (<30 minutes prior to injection)  ° °Injections  °155 total units of Botox was injected with a 30 gauge needle. ° °Injection Sites: °L occipitalis: 15 units- 3 sites  °R occiptalis: 15 units- 3 sites ° °L upper trapezius: 15 units- 3 sites °R upper trapezius: 15 units- 3 sits          °L paraspinal: 10 units- 2 sites °R paraspinal: 10 units- 2 sites ° °Face °L frontalis(2 injection sites):10 units   °R frontalis(2 injection sites):10 units         °L corrugator: 5 units   °R corrugator: 5 units           °Procerus: 5 units   °L temporalis: 20 units °R temporalis: 20 units  ° °Agent:  °200 units of botulinum Type  A (Onobotulinum Toxin type A) was reconstituted with 4 ml of preservative free normal saline.  °Time of reconstitution: At the time of the office visit (<30 minutes prior to injection)  ° ° ° Total injected (Units):  155 ° Total wasted (Units):  45 ° °Patient tolerated procedure well without complications.   °Reinjection is anticipated in 3 months. ° ° °

## 2018-01-02 ENCOUNTER — Ambulatory Visit: Payer: 59 | Admitting: Physician Assistant

## 2018-01-20 ENCOUNTER — Encounter: Payer: Self-pay | Admitting: Neurology

## 2018-01-20 ENCOUNTER — Ambulatory Visit: Payer: 59 | Admitting: Neurology

## 2018-01-20 VITALS — BP 96/64 | HR 77 | Resp 16 | Ht 68.0 in | Wt 140.0 lb

## 2018-01-20 DIAGNOSIS — G43709 Chronic migraine without aura, not intractable, without status migrainosus: Secondary | ICD-10-CM

## 2018-01-20 MED ORDER — TIZANIDINE HCL 4 MG PO TABS: 4.0000 mg | ORAL_TABLET | Freq: Every evening | ORAL | 0 refills | Status: DC | PRN

## 2018-01-20 MED FILL — tiZANidine HCL 4 MG TABS: 4 | 30 days supply | Qty: 30 | Fill #0

## 2018-01-20 NOTE — Progress Notes (Signed)
NEUROLOGY FOLLOW UP OFFICE NOTE  Alice Williams [id number]  HISTORY OF PRESENT ILLNESS: Alice Williams is a 57 year old female with migraine, anxiety, who follows up for chronic migraines.  UPDATE: She status post first round of Botox from 12/20/17.  Headaches have improved somewhat.  She now has daily mild headaches, described as mild-moderate stabbing headaches on right sided at back of her head, forehead and side of nose.  It lasts about 30 minutes and occurs a couple of times a day.  It is manageable and does not require treatment.  Since Botox, she reports burning pain in the right trapezius muscle.  Migraines have reduced in frequency.  She reports 5 migraines over past 30 days.  2 lasted 4 hours, which were moderate and didn't require abortive therapy.  She had a severe migraine last week which lasted 3 days.  It did not respond to Cambia. Current NSAIDS:  Cambia Current analgesics:  no Current triptans:  no Current anti-emetic:  no Current muscle relaxants: no Current anti-anxiolytic:  hydroxyzine Current sleep aide:  no Current Antihypertensive medications:  nadolol Current Antidepressant medications:  no Current Anticonvulsant medications:  topiramate 100mg  Current Vitamins/Herbal/Supplements:  magnesium Current Antihistamines/Decongestants:  no Other therapy:  Botox (status post first round)   Caffeine:  1 soda daily to treat headache Alcohol:  no Smoker:  no Diet:  Hydrates Exercise:  No Depression/anxiety:  yes Sleep hygiene:  varies  HISTORY: Onset:  In her 34s, she was initially diagnosed with cluster headache.  She had right sided parietal headaches.  In her 21s, headaches returned and associated with nausea and vomiting.  They would start at base of her head on the right, radiating to the front.  In her 26s, she developed associated nystagmus with her migraines.  Since age 19, she has had variable headaches, now left sided as well. She has a constant headache but will  have episodes of severe headache. Location:  Right or left sided Quality:  throbbing Initial Intensity:  severe Aura:  no Prodrome:  no Postdrome:  no Associated symptoms:  photophobia, phonophobia.  Sometimes nausea or nystagmus.  Sometimes when she has a headache, she feels like she is leaning to one side.  No vomiting, autonomic symptoms or visual disturbance.  She has not had any new worse headache of her life, waking up from sleep Initial Duration:  3 days Initial Frequency:  5 to 10 days a month. Initial Frequency of abortive medication: daily Triggers/exacerbating factors:  Stress, certain smells (perfumes, lotions), laying in bed, sleep Relieving factors:  no Activity:  aggravates   Past NSAIDS:  Indomethacin, ibuprofen, Aleve (overuse) Past analgesics:  Tylenol, Excedrin (overuse) Past abortive triptans:  Sumatriptan (anaphylactic reaction- throat swollen, severe headache) Past muscle relaxants:  Flexeril, tizanidine Past anti-emetic:  Reglan 10mg , promethazine Past antihypertensive medications:  propranolol Past antidepressant medications:  amitriptyline 100mg , Effexor Past anticonvulsant medications:  gabapentin Past vitamins/Herbal/Supplements:  magnesium Past antihistamines/decongestants:  no Other past therapies:  no   Family history of headache:  Mother (migraines)  PAST MEDICAL HISTORY: Past Medical History:  Diagnosis Date  . Anxiety   . Breast calcifications on mammogram   . Facet arthropathy, lumbosacral 08/28/2017  . GERD (gastroesophageal reflux disease)   . History of colon polyps   . Inappropriate sinus tachycardia   . Migraines   . Osteopenia   . Osteoporosis of femur without pathological fracture 08/28/2017  . Palpitations   . Premature surgical menopause     MEDICATIONS: Current Outpatient  Medications on File Prior to Visit  Medication Sig Dispense Refill  . aspirin-acetaminophen-caffeine (EXCEDRIN MIGRAINE) 250-250-65 MG tablet Take by mouth.     . Calcium Carbonate-Vitamin D 600-400 MG-UNIT tablet Take 1 tablet by mouth 2 (two) times daily. 60 tablet 11  . Coenzyme Q10 (CO Q 10 PO) Take by mouth.    . Cyanocobalamin (B-12 PO) Take by mouth.    . Diclofenac Potassium (CAMBIA) 50 MG PACK Take 50 mg by mouth as directed. 1 each 0  . MAGNESIUM PO Take by mouth.    . nadolol (CORGARD) 20 MG tablet Take 1 tablet (20 mg total) by mouth daily. 90 tablet 0  . omeprazole (PRILOSEC) 40 MG capsule Take 40 mg daily by mouth.    . sertraline (ZOLOFT) 50 MG tablet Take 1 tablet (50 mg total) by mouth daily. 30 tablet 3  . Suvorexant 20 MG TABS Take 1 tablet at bedtime by mouth. 30 tablet 3  . topiramate (TOPAMAX) 100 MG tablet Take 1 tablet (100 mg total) by mouth at bedtime. 45 tablet 3  . BOTOX 200 units SOLR   3   No current facility-administered medications on file prior to visit.     ALLERGIES: Allergies  Allergen Reactions  . Sulfa Antibiotics Anaphylaxis  . Sumatriptan Succinate Anaphylaxis    FAMILY HISTORY: Family History  Problem Relation Age of Onset  . Colon cancer Mother   . Breast cancer Mother   . Hyperlipidemia Father   . Alcohol abuse Father   . Heart attack Father   . Hyperlipidemia Brother   . Lung cancer Maternal Grandmother   . Parkinson's disease Maternal Grandfather   . Dementia Maternal Grandfather   . Parkinson's disease Paternal Grandmother   . Heart attack Paternal Grandfather     SOCIAL HISTORY: Social History   Socioeconomic History  . Marital status: Divorced    Spouse name: Not on file  . Number of children: Not on file  . Years of education: Not on file  . Highest education level: Master's degree (e.g., MA, MS, MEng, MEd, MSW, MBA)  Occupational History  . Occupation: Therapist, sports  Social Needs  . Financial resource strain: Not on file  . Food insecurity:    Worry: Not on file    Inability: Not on file  . Transportation needs:    Medical: Not on file    Non-medical: Not on file  Tobacco Use   . Smoking status: Former Smoker    Last attempt to quit: 08/09/2006    Years since quitting: 11.4  . Smokeless tobacco: Never Used  Substance and Sexual Activity  . Alcohol use: Yes  . Drug use: No  . Sexual activity: Never    Birth control/protection: Surgical  Lifestyle  . Physical activity:    Days per week: Not on file    Minutes per session: Not on file  . Stress: Not on file  Relationships  . Social connections:    Talks on phone: Not on file    Gets together: Not on file    Attends religious service: Not on file    Active member of club or organization: Not on file    Attends meetings of clubs or organizations: Not on file    Relationship status: Not on file  . Intimate partner violence:    Fear of current or ex partner: Not on file    Emotionally abused: Not on file    Physically abused: Not on file    Forced  sexual activity: Not on file  Other Topics Concern  . Not on file  Social History Narrative   Single, livevs alone in a 2 story house. Drinks one soda a day. Does not exercise regularly.    REVIEW OF SYSTEMS: Constitutional: No fevers, chills, or sweats, no generalized fatigue, change in appetite Eyes: No visual changes, double vision, eye pain Ear, nose and throat: No hearing loss, ear pain, nasal congestion, sore throat Cardiovascular: No chest pain, palpitations Respiratory:  No shortness of breath at rest or with exertion, wheezes GastrointestinaI: No nausea, vomiting, diarrhea, abdominal pain, fecal incontinence Genitourinary:  No dysuria, urinary retention or frequency Musculoskeletal:  Right trapezius pain Integumentary: No rash, pruritus, skin lesions Neurological: as above Psychiatric: No depression, insomnia, anxiety Endocrine: No palpitations, fatigue, diaphoresis, mood swings, change in appetite, change in weight, increased thirst Hematologic/Lymphatic:  No purpura, petechiae. Allergic/Immunologic: no itchy/runny eyes, nasal congestion, recent  allergic reactions, rashes  PHYSICAL EXAM: Vitals:   01/20/18 1126  BP: 96/64  Pulse: 77  Resp: 16  SpO2: 96%   General: No acute distress.  Patient appears well-groomed.  normal body habitus. Head:  Normocephalic/atraumatic Eyes:  Fundi examined but not visualized Neck: supple, no paraspinal tenderness, full range of motion Heart:  Regular rate and rhythm Lungs:  Clear to auscultation bilaterally Back: No paraspinal tenderness Neurological Exam: alert and oriented to person, place, and time. Attention span and concentration intact, recent and remote memory intact, fund of knowledge intact.  Speech fluent and not dysarthric, language intact.  CN II-XII intact. Bulk and tone normal, muscle strength 5/5 throughout.  Sensation to light touch, temperature and vibration intact.  Deep tendon reflexes 2+ throughout, toes downgoing.  Finger to nose and heel to shin testing intact.  Gait normal, Romberg negative.  IMPRESSION: Chronic migraine Right trapezius pain may be side effect to Botox.  PLAN: 1.  Tizanidine 4mg  at bedtime for trapezius pain. 2.  She will give Cambia another try.  If still not effective, will try Sprix nasal spray. 3.  Follow up at end of May for 2nd round of botox.  If trapezius pain persists, will avoid injecting right trapezius.   4.  Headache diary 5.  Limit pain relievers to no more than 2 days out of week. 6.  Follow up in 3 to 4 months.  Metta Clines, DO  CC: Nelson Chimes, PA-C

## 2018-01-20 NOTE — Patient Instructions (Signed)
1.  Try taking Cambia again.  If still not effective, then let me know and we can try something different. 2.  For right shoulder pain, try tizanidine 2mg  at bedtime 3.  Follow up for next Botox at end of May. 4.  Follow up with me in 3 months.

## 2018-01-27 ENCOUNTER — Other Ambulatory Visit: Payer: Self-pay | Admitting: Neurology

## 2018-01-27 MED FILL — TOPIRAMATE 100 MG TABLET: 100 | 45 days supply | Qty: 45 | Fill #0

## 2018-02-24 ENCOUNTER — Encounter: Payer: Self-pay | Admitting: Neurology

## 2018-02-25 ENCOUNTER — Other Ambulatory Visit: Payer: Self-pay | Admitting: Physician Assistant

## 2018-02-26 MED ORDER — NADOLOL 20 MG PO TABS: 20.0000 mg | ORAL_TABLET | Freq: Every day | ORAL | 0 refills | Status: DC

## 2018-02-26 MED FILL — NADOLOL 20 MG TAB: 20 | 30 days supply | Qty: 30 | Fill #0

## 2018-03-21 MED FILL — BOTOX 200 UNITS VIAL: 200 | 90 days supply | Qty: 1 | Fill #1

## 2018-03-21 NOTE — Progress Notes (Signed)
Called and spoke with [name]. He will get refill ready.   Called Pt, LMOVM advising her to call to ensure botox is available for pick up prior to her appt on 03/28/18

## 2018-03-26 ENCOUNTER — Telehealth: Payer: Self-pay

## 2018-03-26 NOTE — Telephone Encounter (Signed)
[facility] to ensure Botox had been filled and picked up.

## 2018-03-27 ENCOUNTER — Ambulatory Visit: Payer: 59 | Admitting: Physician Assistant

## 2018-03-27 ENCOUNTER — Encounter: Payer: Self-pay | Admitting: Physician Assistant

## 2018-03-27 VITALS — BP 121/78 | HR 67 | Wt 149.0 lb

## 2018-03-27 DIAGNOSIS — Z79899 Other long term (current) drug therapy: Secondary | ICD-10-CM

## 2018-03-27 DIAGNOSIS — F411 Generalized anxiety disorder: Secondary | ICD-10-CM | POA: Diagnosis not present

## 2018-03-27 DIAGNOSIS — F5105 Insomnia due to other mental disorder: Secondary | ICD-10-CM | POA: Diagnosis not present

## 2018-03-27 DIAGNOSIS — F419 Anxiety disorder, unspecified: Secondary | ICD-10-CM | POA: Diagnosis not present

## 2018-03-27 DIAGNOSIS — I471 Supraventricular tachycardia: Secondary | ICD-10-CM

## 2018-03-27 MED ORDER — NADOLOL 20 MG PO TABS: 20.0000 mg | ORAL_TABLET | Freq: Every day | ORAL | 1 refills | Status: DC

## 2018-03-27 MED ORDER — ESZOPICLONE 1 MG PO TABS: 1.0000 mg | ORAL_TABLET | Freq: Every evening | ORAL | 2 refills | Status: DC | PRN

## 2018-03-27 MED FILL — NADOLOL 20 MG TAB: 20 | 90 days supply | Qty: 90 | Fill #0 | Status: TO

## 2018-03-27 MED FILL — ESZOPICLONE 1 MG TABLET: 1 | 30 days supply | Qty: 30 | Fill #0

## 2018-03-27 NOTE — Patient Instructions (Addendum)
For sleep: - start Eszopiclone 1 mg at bedtime. Avoid taking with a high-fat meal  Sleep Hygiene . Limiting daytime naps to 30 minutes . Napping does not make up for inadequate nighttime sleep. However, a short nap of 20-30 minutes can help to improve mood, alertness and performance.  . Avoiding stimulants such as  caffeine and nicotine close to bedtime.  And when it comes to alcohol, moderation is key 4. While alcohol is well-known to help you fall asleep faster, too much close to bedtime can disrupt sleep in the second half of the night as the body begins to process the alcohol.    . Exercising to promote good quality sleep.  As little as 10 minutes of aerobic exercise, such as walking or cycling, can drastically improve nighttime sleep quality.  For the best night's sleep, most people should avoid strenuous workouts close to bedtime. However, the effect of intense nighttime exercise on sleep differs from person to person, so find out what works best for you.   . Steering clear of food that can be disruptive right before sleep.   Heavy or rich foods, fatty or fried meals, spicy dishes, citrus fruits, and carbonated drinks can trigger indigestion for some people. When this occurs close to bedtime, it can lead to painful heartburn that disrupts sleep. . Ensuring adequate exposure to natural light.  This is particularly important for individuals who may not venture outside frequently. Exposure to sunlight during the day, as well as darkness at night, helps to maintain a healthy sleep-wake cycle . Marland Kitchen Establishing a regular relaxing bedtime routine.  A regular nightly routine helps the body recognize that it is bedtime. This could include taking warm shower or bath, reading a book, or light stretches. When possible, try to avoid emotionally upsetting conversations and activities before attempting to sleep. . Making sure that the sleep environment is pleasant.  Mattress and pillows should be comfortable. The  bedroom should be cool - between 60 and 67 degrees - for optimal sleep. Bright light from lamps, cell phone and TV screens can make it difficult to fall asleep4, so turn those light off or adjust them when possible. Consider using blackout curtains, eye shades, ear plugs, "white noise" machines, humidifiers, fans and other devices that can make the bedroom more relaxing. . Meditation. YouTube [address]. There are many smartphone apps as well

## 2018-03-27 NOTE — Progress Notes (Signed)
HPI:                                                                Alice Williams is a 57 y.o. female who presents to [place]: [place] today for medication management   Inappropriate sinus tachycardia: she was evaluated by Cardiology at [facility] in 2015. She had a nondiagnostic tilt table and normal echocardiogram. She is doing well on Nadolol daily and an extra dose prn for activity. She denies any palpitations or syncope.   Insomnia: tried Belsomra 20 mg for 2 months. Reports she was able to sleep 5 hours, but continued to have 2 or more nighttime awakenings. Did not feel medication was beneficial  Anxiety: self-discontinued Zoloft. She tolerated 25 mg, but reports feeling "like a zombie" on 50 mg. Reports mild anxiety symptoms which are not interfering with daily activity.  Depression screen [facility] 2/9 03/27/2018 12/06/2017 08/09/2017  Decreased Interest 1 0 2  Down, Depressed, Hopeless 1 0 2  PHQ - 2 Score 2 0 4  Altered sleeping 2 3 3   Tired, decreased energy 3 2 3   Change in appetite 0 0 1  Feeling bad or failure about yourself  0 0 0  Trouble concentrating 0 0 1  Moving slowly or fidgety/restless 0 0 0  Suicidal thoughts 0 0 0  PHQ-9 Score 7 5 12   Difficult doing work/chores Somewhat difficult Somewhat difficult -    GAD 7 : Generalized Anxiety Score 03/27/2018 12/06/2017 09/03/2017  Nervous, Anxious, on Edge 0 0 1  Control/stop worrying 1 2 3   Worry too much - different things 1 2 2   Trouble relaxing 1 2 2   Restless 0 0 2  Easily annoyed or irritable 1 2 2   Afraid - awful might happen 0 0 2  Total GAD 7 Score 4 8 14   Anxiety Difficulty Not difficult at all Not difficult at all Not difficult at all      Past Medical History:  Diagnosis Date  . Anxiety   . Breast calcifications on mammogram   . Facet arthropathy, lumbosacral 08/28/2017  . GERD (gastroesophageal reflux disease)   . History of colon polyps   . Inappropriate sinus  tachycardia   . Migraines   . Osteopenia   . Osteoporosis of femur without pathological fracture 08/28/2017  . Palpitations   . Premature surgical menopause    Past Surgical History:  Procedure Laterality Date  . ANAL FISSURE REPAIR    . APPENDECTOMY    . COLONOSCOPY W/ POLYPECTOMY  03/2015  . KNEE SURGERY    . OOPHORECTOMY    . [place]  . PELVIC LAPAROSCOPY     x 4 for endometriosis   . VAGINAL HYSTERECTOMY     Social History   Tobacco Use  . Smoking status: Former Smoker    Last attempt to quit: 08/09/2006    Years since quitting: 11.6  . Smokeless tobacco: Never Used  Substance Use Topics  . Alcohol use: Yes   family history includes Alcohol abuse in her father; Breast cancer in her mother; Colon cancer in her mother; Dementia in her maternal grandfather; Heart attack in her father and paternal grandfather; Hyperlipidemia in her brother and father; Lung cancer  in her maternal grandmother; Parkinson's disease in her maternal grandfather and paternal grandmother.    ROS: negative except as noted in the HPI  Medications: Current Outpatient Medications  Medication Sig Dispense Refill  . aspirin-acetaminophen-caffeine (EXCEDRIN MIGRAINE) 250-250-65 MG tablet Take by mouth.    Marland Kitchen BOTOX 200 units SOLR   3  . Calcium Carbonate-Vitamin D 600-400 MG-UNIT tablet Take 1 tablet by mouth 2 (two) times daily. 60 tablet 11  . Coenzyme Q10 (CO Q 10 PO) Take by mouth.    . Cyanocobalamin (B-12 PO) Take by mouth.    . Diclofenac Potassium (CAMBIA) 50 MG PACK Take 50 mg by mouth as directed. 1 each 0  . MAGNESIUM PO Take by mouth.    . nadolol (CORGARD) 20 MG tablet Take 1 tablet (20 mg total) by mouth daily. Due for follow up with PCP 90 tablet 1  . omeprazole (PRILOSEC) 40 MG capsule Take 20 mg by mouth daily.    Marland Kitchen tiZANidine (ZANAFLEX) 4 MG tablet Take 1 tablet (4 mg total) by mouth at bedtime as needed for muscle spasms. 30 tablet 0  . topiramate (TOPAMAX) 100  MG tablet TAKE 1 TABLET (100 MG TOTAL) BY MOUTH AT BEDTIME. 45 tablet 3  . eszopiclone (LUNESTA) 1 MG TABS tablet Take 1 tablet (1 mg total) by mouth at bedtime as needed for sleep. Take immediately before bedtime 30 tablet 2   No current facility-administered medications for this visit.    Allergies  Allergen Reactions  . Sulfa Antibiotics Anaphylaxis  . Sumatriptan Succinate Anaphylaxis       Objective:  BP 121/78   Pulse 67   Wt 149 lb (67.6 kg)   BMI 22.66 kg/m  Gen:  alert, not ill-appearing, no distress, appropriate for age 4: head normocephalic without obvious abnormality, conjunctiva and cornea clear, wearing glasses, trachea midline Pulm: Normal work of breathing, normal phonation, clear to auscultation bilaterally, no wheezes, rales or rhonchi CV: Normal rate, regular rhythm, s1 and s2 distinct, no murmurs, clicks or rubs  Neuro: alert and oriented x 3, no tremor MSK: extremities atraumatic, normal gait and station Skin: intact, no rashes on exposed skin, no jaundice, no cyanosis Psych: well-groomed, cooperative, good eye contact, euthymic mood, affect mood-congruent, speech is articulate, and thought processes clear and goal-directed    No results found for this or any previous visit (from the past 72 hour(s)). No results found.    Assessment and Plan: 57 y.o. female with   Encounter for medication management  Paroxysmal sinus tachycardia (HCC) - Plan: nadolol (CORGARD) 20 MG tablet  Insomnia secondary to anxiety - Plan: eszopiclone (LUNESTA) 1 MG TABS tablet  Paroxysmal sinus tachycardia - no palpitations or episodes of tachycardia in the last year. Not currently exercising. Continue Corgard 20 mg daily  Insomnia secondary to anxiety, GAD - GAD7=4, PHQ2=2 - adverse effects with low-dose Sertraline. declines antidepressant medication at this time. - prior sleep medications: melatonin, Unisom, suvorexant - will trial low-dose Lunesta 1 mg at bedtime x  6 weeks - continue sleep hygiene   Patient education and anticipatory guidance given Patient agrees with treatment plan Follow-up in 6 weeks or sooner as needed if symptoms worsen or fail to improve  Darlyne Russian PA-C

## 2018-03-28 ENCOUNTER — Ambulatory Visit (INDEPENDENT_AMBULATORY_CARE_PROVIDER_SITE_OTHER): Payer: 59 | Admitting: Neurology

## 2018-03-28 DIAGNOSIS — G43709 Chronic migraine without aura, not intractable, without status migrainosus: Secondary | ICD-10-CM

## 2018-03-28 MED ORDER — ONABOTULINUMTOXINA 100 UNITS IJ SOLR
200.0000 [IU] | Freq: Once | INTRAMUSCULAR | Status: AC
  Administered 2018-03-28: 200 [IU] via INTRAMUSCULAR

## 2018-03-28 NOTE — Patient Instructions (Signed)
Stop Cambia At earliest onset of migraine, use Sprix (ketorolac) nasal spray:  1 spray in each nostril every 6-8 hours up to 4 times in 24 hours.

## 2018-03-28 NOTE — Progress Notes (Signed)
[facility]   Procedure Note Botox  Attending: Dr. Tomi Likens  Preoperative Diagnosis(es): Chronic migraine  Consent obtained from: The patient Benefits discussed included, but were not limited to decreased muscle tightness, increased joint range of motion, and decreased pain.  Risk discussed included, but were not limited pain and discomfort, bleeding, bruising, excessive weakness, venous thrombosis, muscle atrophy and dysphagia.  Anticipated outcomes of the procedure as well as he risks and benefits of the alternatives to the procedure, and the roles and tasks of the personnel to be involved, were discussed with the patient, and the patient consents to the procedure and agrees to proceed. A copy of the patient medication guide was given to the patient which explains the blackbox warning.  Patients identity and treatment sites confirmed Yes.  .  Details of Procedure: Skin was cleaned with alcohol. Prior to injection, the needle plunger was aspirated to make sure the needle was not within a blood vessel.  There was no blood retrieved on aspiration.    Following is a summary of the muscles injected  And the amount of Botulinum toxin used:  Dilution 200 units of Botox was reconstituted with 4 ml of preservative free normal saline. Time of reconstitution: At the time of the office visit (<30 minutes prior to injection)   Injections  155 total units of Botox was injected with a 30 gauge needle.  Injection Sites: L occipitalis: 15 units- 3 sites  R occiptalis: 15 units- 3 sites  L upper trapezius: 15 units- 3 sites R upper trapezius: 15 units- 3 sits          L paraspinal: 10 units- 2 sites R paraspinal: 10 units- 2 sites  Face L frontalis(2 injection sites):10 units   R frontalis(2 injection sites):10 units         L corrugator: 5 units   R corrugator: 5 units           Procerus: 5 units   L temporalis: 20 units R temporalis: 20 units   Agent:  200 units of botulinum Type A  (Onobotulinum Toxin type A) was reconstituted with 4 ml of preservative free normal saline.  Time of reconstitution: At the time of the office visit (<30 minutes prior to injection)     Total injected (Units): 155  Total wasted (Units): None wasted  Patient tolerated procedure well without complications.   Reinjection is anticipated in 3 months. Return to clinic in 5-6 weeks

## 2018-04-01 MED FILL — TOPIRAMATE 100 MG TABLET: 100 | 45 days supply | Qty: 45 | Fill #1

## 2018-04-09 ENCOUNTER — Ambulatory Visit: Payer: 59 | Admitting: Physician Assistant

## 2018-04-09 ENCOUNTER — Encounter: Payer: Self-pay | Admitting: Physician Assistant

## 2018-04-09 VITALS — BP 120/80 | HR 72 | Wt 149.0 lb

## 2018-04-09 DIAGNOSIS — Z803 Family history of malignant neoplasm of breast: Secondary | ICD-10-CM | POA: Diagnosis not present

## 2018-04-09 DIAGNOSIS — N644 Mastodynia: Secondary | ICD-10-CM

## 2018-04-09 DIAGNOSIS — N6320 Unspecified lump in the left breast, unspecified quadrant: Secondary | ICD-10-CM

## 2018-04-09 DIAGNOSIS — N6019 Diffuse cystic mastopathy of unspecified breast: Secondary | ICD-10-CM | POA: Insufficient documentation

## 2018-04-09 NOTE — Progress Notes (Signed)
HPI:                                                                Alice Williams is a 57 y.o. female who presents to [place]: [place] today for left breast pain  For the last 6 months patient has had intermittent "twinges" and sharp pains in her left lateral breast radiating to her left underarm. Pain is described as "deep." Feels like pain is becoming more persistent and bothersome. Has noticed it is worse when she uses her left arm to drive. Additionally, left breast feels fuller to her and reports the tissue is lumpier and different in the left upper outer breast compared to the right. She noted this about 3 months ago. Reports history of calcifications in this breast. Screening mammogram in October was negative. Family hx significant for breast cancer in mother.  Depression screen [facility] 2/9 03/27/2018 12/06/2017 08/09/2017  Decreased Interest 1 0 2  Down, Depressed, Hopeless 1 0 2  PHQ - 2 Score 2 0 4  Altered sleeping 2 3 3   Tired, decreased energy 3 2 3   Change in appetite 0 0 1  Feeling bad or failure about yourself  0 0 0  Trouble concentrating 0 0 1  Moving slowly or fidgety/restless 0 0 0  Suicidal thoughts 0 0 0  PHQ-9 Score 7 5 12   Difficult doing work/chores Somewhat difficult Somewhat difficult -    GAD 7 : Generalized Anxiety Score 03/27/2018 12/06/2017 09/03/2017  Nervous, Anxious, on Edge 0 0 1  Control/stop worrying 1 2 3   Worry too much - different things 1 2 2   Trouble relaxing 1 2 2   Restless 0 0 2  Easily annoyed or irritable 1 2 2   Afraid - awful might happen 0 0 2  Total GAD 7 Score 4 8 14   Anxiety Difficulty Not difficult at all Not difficult at all Not difficult at all      Past Medical History:  Diagnosis Date  . Anxiety   . Breast calcifications on mammogram   . Facet arthropathy, lumbosacral 08/28/2017  . GERD (gastroesophageal reflux disease)   . History of colon polyps   . Inappropriate sinus tachycardia    . Migraines   . Osteopenia   . Osteoporosis of femur without pathological fracture 08/28/2017  . Palpitations   . Premature surgical menopause    Past Surgical History:  Procedure Laterality Date  . ANAL FISSURE REPAIR    . APPENDECTOMY    . COLONOSCOPY W/ POLYPECTOMY  03/2015  . KNEE SURGERY    . OOPHORECTOMY    . [place]  . PELVIC LAPAROSCOPY     x 4 for endometriosis   . VAGINAL HYSTERECTOMY     Social History   Tobacco Use  . Smoking status: Former Smoker    Last attempt to quit: 08/09/2006    Years since quitting: 11.6  . Smokeless tobacco: Never Used  Substance Use Topics  . Alcohol use: Yes   family history includes Alcohol abuse in her father; Breast cancer in her mother; Colon cancer in her mother; Dementia in her maternal grandfather; Heart attack in her father and paternal grandfather; Hyperlipidemia in her brother and father; Lung  cancer in her maternal grandmother; Parkinson's disease in her maternal grandfather and paternal grandmother.    ROS: negative except as noted in the HPI  Medications: Current Outpatient Medications  Medication Sig Dispense Refill  . aspirin-acetaminophen-caffeine (EXCEDRIN MIGRAINE) 250-250-65 MG tablet Take by mouth.    Marland Kitchen BOTOX 200 units SOLR   3  . Calcium Carbonate-Vitamin D 600-400 MG-UNIT tablet Take 1 tablet by mouth 2 (two) times daily. 60 tablet 11  . Coenzyme Q10 (CO Q 10 PO) Take by mouth.    . Cyanocobalamin (B-12 PO) Take by mouth.    . Diclofenac Potassium (CAMBIA) 50 MG PACK Take 50 mg by mouth as directed. 1 each 0  . eszopiclone (LUNESTA) 1 MG TABS tablet Take 1 tablet (1 mg total) by mouth at bedtime as needed for sleep. Take immediately before bedtime 30 tablet 2  . MAGNESIUM PO Take by mouth.    . nadolol (CORGARD) 20 MG tablet Take 1 tablet (20 mg total) by mouth daily. Due for follow up with PCP 90 tablet 1  . omeprazole (PRILOSEC) 40 MG capsule Take 20 mg by mouth daily.    Marland Kitchen  tiZANidine (ZANAFLEX) 4 MG tablet Take 1 tablet (4 mg total) by mouth at bedtime as needed for muscle spasms. 30 tablet 0  . topiramate (TOPAMAX) 100 MG tablet TAKE 1 TABLET (100 MG TOTAL) BY MOUTH AT BEDTIME. 45 tablet 3   No current facility-administered medications for this visit.    Allergies  Allergen Reactions  . Sulfa Antibiotics Anaphylaxis  . Sumatriptan Succinate Anaphylaxis       Objective:  BP 120/80   Pulse 72   Wt 149 lb (67.6 kg)   BMI 22.66 kg/m  Gen:  alert, not ill-appearing, no distress, appropriate for age 67: head normocephalic without obvious abnormality, conjunctiva and cornea clear, trachea midline Pulm: Normal work of breathing, normal phonation Breasts: breasts are slightly asymmetric, no palpable masses, no reproducible tenderness, no nipple/areola abnormality, no drainage Neuro: alert and oriented x 3, no tremor MSK: extremities atraumatic, normal gait and station Left arm/shoulder: atraumatic, full active ROM, negative Hawkin's, negative Neer's Skin: intact, no rashes on exposed skin, no jaundice, no cyanosis Lymph: no axillary adenopathy  A chaperone was used for the breast exam  No results found for this or any previous visit (from the past 54 hour(s)). No results found.    Assessment and Plan: 57 y.o. female with   Painful lumpy left breast - Plan: MM Digital Diagnostic Unilat L, US BREAST COMPLETE UNI LEFT INC AXILLA  Family history of breast cancer in mother  -benign physical exam. Symptoms warrant diagnostic mammogram and ultrasound, especially given family hx of breast cancer in first degree relative.  Patient education and anticipatory guidance given Patient agrees with treatment plan Follow-up as needed if symptoms worsen or fail to improve  Darlyne Russian PA-C

## 2018-04-09 NOTE — Patient Instructions (Signed)

## 2018-04-17 ENCOUNTER — Other Ambulatory Visit: Payer: Self-pay | Admitting: Physician Assistant

## 2018-04-17 ENCOUNTER — Ambulatory Visit
Admission: RE | Admit: 2018-04-17 | Discharge: 2018-04-17 | Disposition: A | Discharge status: Home / Self Care | Payer: 59 | Source: Ambulatory Visit | Attending: Physician Assistant | Admitting: Physician Assistant

## 2018-04-17 DIAGNOSIS — R922 Inconclusive mammogram: Secondary | ICD-10-CM | POA: Diagnosis not present

## 2018-04-17 DIAGNOSIS — N644 Mastodynia: Principal | ICD-10-CM

## 2018-04-17 DIAGNOSIS — N6489 Other specified disorders of breast: Secondary | ICD-10-CM | POA: Diagnosis not present

## 2018-04-17 DIAGNOSIS — Z1239 Encounter for other screening for malignant neoplasm of breast: Secondary | ICD-10-CM

## 2018-04-17 DIAGNOSIS — N6320 Unspecified lump in the left breast, unspecified quadrant: Secondary | ICD-10-CM

## 2018-04-17 NOTE — Progress Notes (Signed)
Hi Vinnie Level,  Good news, your mammogram and ultrasound did not show any mass or abnormality of your breast.  They did recommend you have annual breast MRI for screening given your family history. I am going to order this for October and see if insurance will approve it. We will still continue your routine yearly mammograms.  Best, Evlyn Clines

## 2018-04-17 NOTE — Progress Notes (Signed)
mr

## 2018-04-24 ENCOUNTER — Encounter: Payer: Self-pay | Admitting: Physician Assistant

## 2018-04-29 ENCOUNTER — Encounter: Payer: Self-pay | Admitting: Neurology

## 2018-04-29 ENCOUNTER — Ambulatory Visit: Payer: 59 | Admitting: Neurology

## 2018-04-29 ENCOUNTER — Encounter: Payer: Self-pay | Admitting: Physician Assistant

## 2018-04-29 VITALS — BP 104/70 | HR 85 | Ht 68.0 in | Wt 150.0 lb

## 2018-04-29 DIAGNOSIS — G43709 Chronic migraine without aura, not intractable, without status migrainosus: Secondary | ICD-10-CM | POA: Diagnosis not present

## 2018-04-29 MED ORDER — ERGOTAMINE-CAFFEINE 1-100 MG PO TABS: ORAL_TABLET | ORAL | 2 refills | Status: DC

## 2018-04-29 MED ORDER — TOPIRAMATE 100 MG PO TABS: 150.0000 mg | ORAL_TABLET | Freq: Every day | ORAL | 3 refills | Status: DC

## 2018-04-29 MED FILL — TOPIRAMATE 100 MG TABLET: 100 | 30 days supply | Qty: 45 | Fill #0

## 2018-04-29 MED FILL — ERGOTAMINE-CAFFEINE 1-100MG: 1-100 | 25 days supply | Qty: 32 | Fill #0

## 2018-04-29 NOTE — Progress Notes (Signed)
NEUROLOGY FOLLOW UP OFFICE NOTE  Alice Williams [id number]  HISTORY OF PRESENT ILLNESS: Alice Williams is a 57 year old female with migraine, anxiety, who follows up for chronic migraines.   UPDATE: Intensity:  Moderate to severe Duration:  30-45 minutes with Sprix spray, otherwise all day Frequency:  Dull to moderate headache daily, severe 1-2 days a week Current NSAIDS:  no Current analgesics:  Excedrin (infrequent) Current triptans:  no Current anti-emetic:  no Current muscle relaxants: no Current anti-anxiolytic:  hydroxyzine Current sleep aide:  no Current Antihypertensive medications:  nadolol Current Antidepressant medications:  no Current Anticonvulsant medications:  topiramate 100mg  Current Vitamins/Herbal/Supplements:  magnesium Current Antihistamines/Decongestants:  no Other therapy:  Botox (status post second round)   Caffeine:  1 soda daily to treat headache Alcohol:  no Smoker:  no Diet:  Hydrates Exercise:  No Depression/anxiety:  yes Sleep hygiene:  varies   HISTORY: Onset:  In her 70s, she was initially diagnosed with cluster headache.  She had right sided parietal headaches.  In her 77s, headaches returned and associated with nausea and vomiting.  They would start at base of her head on the right, radiating to the front.  In her 16s, she developed associated nystagmus with her migraines.  Since age 45, she has had variable headaches, now left sided as well. She has a constant headache but will have episodes of severe headache. Location:  Right or left sided Quality:  throbbing Initial Intensity:  severe Aura:  no Prodrome:  no Postdrome:  no Associated symptoms:  photophobia, phonophobia.  Sometimes nausea or nystagmus.  Sometimes when she has a headache, she feels like she is leaning to one side.  No vomiting, autonomic symptoms or visual disturbance.  She has not had any new worse headache of her life, waking up from sleep Initial Duration:  3  days Initial Frequency:  5 to 10 days a month. Initial Frequency of abortive medication: daily Triggers/exacerbating factors:  Stress, certain smells (perfumes, lotions), laying in bed, sleep Relieving factors:  no Activity:  aggravates   Past NSAIDS:  Indomethacin, ibuprofen, Aleve (overuse), Cambia (ineffective) Past analgesics:  Tylenol, Excedrin (overuse) Past abortive triptans:  Sumatriptan (anaphylactic reaction- throat swollen, severe headache) Past muscle relaxants:  Flexeril, tizanidine Past anti-emetic:  Reglan 10mg , promethazine Past antihypertensive medications:  propranolol Past antidepressant medications:  amitriptyline 100mg , Effexor Past anticonvulsant medications:  gabapentin Past vitamins/Herbal/Supplements:  magnesium Past antihistamines/decongestants:  no Other past therapies:  no   Family history of headache:  Mother (migraines)  PAST MEDICAL HISTORY: Past Medical History:  Diagnosis Date  . Anxiety   . Breast calcifications on mammogram   . Facet arthropathy, lumbosacral 08/28/2017  . GERD (gastroesophageal reflux disease)   . History of colon polyps   . Inappropriate sinus tachycardia   . Migraines   . Osteopenia   . Osteoporosis of femur without pathological fracture 08/28/2017  . Palpitations   . Premature surgical menopause     MEDICATIONS: Current Outpatient Medications on File Prior to Visit  Medication Sig Dispense Refill  . Ketorolac Tromethamine (SPRIX) 15.75 MG/SPRAY SOLN Place 1 spray into the nose as directed.    . Riboflavin 400 MG CAPS Take 1 capsule by mouth daily.    Marland Kitchen aspirin-acetaminophen-caffeine (EXCEDRIN MIGRAINE) 250-250-65 MG tablet Take by mouth.    Marland Kitchen BOTOX 200 units SOLR   3  . Calcium Carbonate-Vitamin D 600-400 MG-UNIT tablet Take 1 tablet by mouth 2 (two) times daily. 60 tablet 11  . Coenzyme Q10 (  CO Q 10 PO) Take by mouth.    . Cyanocobalamin (B-12 PO) Take by mouth.    . Diclofenac Potassium (CAMBIA) 50 MG PACK Take 50  mg by mouth as directed. (Patient not taking: Reported on 04/29/2018) 1 each 0  . eszopiclone (LUNESTA) 1 MG TABS tablet Take 1 tablet (1 mg total) by mouth at bedtime as needed for sleep. Take immediately before bedtime 30 tablet 2  . MAGNESIUM PO Take by mouth.    . nadolol (CORGARD) 20 MG tablet Take 1 tablet (20 mg total) by mouth daily. Due for follow up with PCP 90 tablet 1  . omeprazole (PRILOSEC) 40 MG capsule Take 20 mg by mouth daily.    Marland Kitchen tiZANidine (ZANAFLEX) 4 MG tablet Take 1 tablet (4 mg total) by mouth at bedtime as needed for muscle spasms. 30 tablet 0   No current facility-administered medications on file prior to visit.     ALLERGIES: Allergies  Allergen Reactions  . Sulfa Antibiotics Anaphylaxis  . Sumatriptan Succinate Anaphylaxis    FAMILY HISTORY: Family History  Problem Relation Age of Onset  . Colon cancer Mother   . Breast cancer Mother 36  . Hyperlipidemia Father   . Alcohol abuse Father   . Heart attack Father   . Hyperlipidemia Brother   . Lung cancer Maternal Grandmother   . Parkinson's disease Maternal Grandfather   . Dementia Maternal Grandfather   . Parkinson's disease Paternal Grandmother   . Heart attack Paternal Grandfather     SOCIAL HISTORY: Social History   Socioeconomic History  . Marital status: Divorced    Spouse name: Not on file  . Number of children: Not on file  . Years of education: Not on file  . Highest education level: Master's degree (e.g., MA, MS, MEng, MEd, MSW, MBA)  Occupational History  . Occupation: Therapist, sports  Social Needs  . Financial resource strain: Not on file  . Food insecurity:    Worry: Not on file    Inability: Not on file  . Transportation needs:    Medical: Not on file    Non-medical: Not on file  Tobacco Use  . Smoking status: Former Smoker    Last attempt to quit: 08/09/2006    Years since quitting: 11.7  . Smokeless tobacco: Never Used  Substance and Sexual Activity  . Alcohol use: Yes  . Drug use:  No  . Sexual activity: Never    Birth control/protection: Surgical  Lifestyle  . Physical activity:    Days per week: Not on file    Minutes per session: Not on file  . Stress: Not on file  Relationships  . Social connections:    Talks on phone: Not on file    Gets together: Not on file    Attends religious service: Not on file    Active member of club or organization: Not on file    Attends meetings of clubs or organizations: Not on file    Relationship status: Not on file  . Intimate partner violence:    Fear of current or ex partner: Not on file    Emotionally abused: Not on file    Physically abused: Not on file    Forced sexual activity: Not on file  Other Topics Concern  . Not on file  Social History Narrative   Single, livevs alone in a 2 story house. Drinks one soda a day. Does not exercise regularly.    REVIEW OF SYSTEMS: Constitutional: No fevers,  chills, or sweats, no generalized fatigue, change in appetite Eyes: No visual changes, double vision, eye pain Ear, nose and throat: No hearing loss, ear pain, nasal congestion, sore throat Cardiovascular: No chest pain, palpitations Respiratory:  No shortness of breath at rest or with exertion, wheezes GastrointestinaI: No nausea, vomiting, diarrhea, abdominal pain, fecal incontinence Genitourinary:  No dysuria, urinary retention or frequency Musculoskeletal:  No neck pain, back pain Integumentary: No rash, pruritus, skin lesions Neurological: as above Psychiatric: No depression, insomnia, anxiety Endocrine: No palpitations, fatigue, diaphoresis, mood swings, change in appetite, change in weight, increased thirst Hematologic/Lymphatic:  No purpura, petechiae. Allergic/Immunologic: no itchy/runny eyes, nasal congestion, recent allergic reactions, rashes  PHYSICAL EXAM: Vitals:   04/29/18 0948  BP: 104/70  Pulse: 85  SpO2: 97%   General: No acute distress.  Patient appears well-groomed.  normal body habitus. Head:   Normocephalic/atraumatic Eyes:  Fundi examined but not visualized Neck: supple, no paraspinal tenderness, full range of motion Heart:  Regular rate and rhythm Lungs:  Clear to auscultation bilaterally Back: No paraspinal tenderness Neurological Exam: alert and oriented to person, place, and time. Attention span and concentration intact, recent and remote memory intact, fund of knowledge intact.  Speech fluent and not dysarthric, language intact.  CN II-XII intact. Bulk and tone normal, muscle strength 5/5 throughout.  Sensation to light touch  intact.  Deep tendon reflexes 2+ throughout.  Finger to nose testing intact.  Gait normal, Romberg negative.  IMPRESSION: Chronic migraine without aura, without status migrainosus, not intractable  PLAN: 1.  Stop Botox.  Will increase topiramate to 150mg  at bedtime, may increase to 200mg  in 6 weeks.  She will return in 3 months.  If headaches not improved, will try Emgality 2.  For abortive therapy,  take Cafergot, 1 to 2 tablets.  May repeat every hour as needed up to 4 tablets in 24 hours or limited to two days a week. 3.  Limit pain relievers to no more than 2 days a week to prevent rebound headache. 4. Headache diary 5.  Follow up in 3 months.  Metta Clines, DO  CC:  Nelson Chimes, PA-C

## 2018-04-29 NOTE — Patient Instructions (Signed)
1.  We will stop Botox.  We will start one of the new migraine medications (anti-CGRP medications, self-injection once a month called Emgality).  However, you cannot start it until it has been 3 months from your last Botox.  So in the meantime, I will increase topiramate to 150mg  at bedtime.  In 6 weeks, contact me and we can increase it to 200mg  at bedtime.  When you follow up in 3 months, we will reassess.  If headaches still frequent, will proceed with Emgality. 2.  When you get a migraine, take Cafergot, 1 to 2 tablets.  May repeat every hour as needed up to 4 tablets in 24 hours or limited to two days a week. 3.  Follow up in 3 months

## 2018-04-30 MED FILL — ESZOPICLONE 1 MG TABLET: 1 | 30 days supply | Qty: 30 | Fill #1

## 2018-05-04 ENCOUNTER — Encounter: Payer: Self-pay | Admitting: Emergency Medicine

## 2018-05-04 ENCOUNTER — Other Ambulatory Visit: Payer: Self-pay

## 2018-05-04 ENCOUNTER — Encounter: Payer: Self-pay | Admitting: Physician Assistant

## 2018-05-04 ENCOUNTER — Emergency Department (INDEPENDENT_AMBULATORY_CARE_PROVIDER_SITE_OTHER)
Admission: EM | Admit: 2018-05-04 | Discharge: 2018-05-04 | Disposition: A | Discharge status: Home / Self Care | Payer: 59 | Source: Home / Self Care | Attending: Family Medicine | Admitting: Family Medicine

## 2018-05-04 DIAGNOSIS — H15101 Unspecified episcleritis, right eye: Secondary | ICD-10-CM | POA: Diagnosis not present

## 2018-05-04 MED ORDER — KETOROLAC TROMETHAMINE 0.5 % OP SOLN: 1.0000 [drp] | Freq: Four times a day (QID) | OPHTHALMIC | 0 refills | Status: DC

## 2018-05-04 NOTE — ED Triage Notes (Signed)
Patient awoke to yellow, sticky discharge in right eye; sclera is reddened; some irritation.

## 2018-05-04 NOTE — ED Provider Notes (Signed)
[facility]    CSN: [id number] Arrival date & time: 05/04/18  1243     History   Chief Complaint Chief Complaint  Patient presents with  . Eye Problem    HPI Alice Williams is a 57 y.o. female.   Patient awoke this morning with mucoid discharge in her right eye with crusting and mild redness.  No change in vision.  No pain or foreign body sensation.  No sore throat, sinus congestion, or URI symptoms.  The history is provided by the patient.  Eye Problem  Location:  Right eye Quality: no pain. Severity:  Mild Onset quality:  Sudden Duration:  7 hours Timing:  Constant Progression:  Improving Chronicity:  New Context: not burn, not chemical exposure, not contact lens problem, not direct trauma, not foreign body, not using machinery, not scratch, not smoke exposure and not UV exposure   Relieved by:  None tried Worsened by:  Nothing Ineffective treatments:  None tried Associated symptoms: crusting, discharge and redness   Associated symptoms: no blurred vision, no decreased vision, no double vision, no facial rash, no headaches, no inflammation, no itching, no photophobia, no scotomas, no swelling and no tearing   Risk factors: no exposure to pinkeye and no recent URI     Past Medical History:  Diagnosis Date  . Anxiety   . Breast calcifications on mammogram   . Facet arthropathy, lumbosacral 08/28/2017  . GERD (gastroesophageal reflux disease)   . History of colon polyps   . Inappropriate sinus tachycardia   . Migraines   . Osteopenia   . Osteoporosis of femur without pathological fracture 08/28/2017  . Palpitations   . Premature surgical menopause     Patient Active Problem List   Diagnosis Date Noted  . Dense breast tissue on mammogram 04/17/2018  . Painful lumpy left breast 04/09/2018  . Family history of breast cancer in mother 04/09/2018  . Paroxysmal sinus tachycardia ([place]) 03/27/2018  . Osteoporosis of femur without pathological fracture  08/28/2017  . Facet arthropathy, lumbosacral 08/28/2017  . Sacrococcygeal pain 08/27/2017  . Left lumbar radiculopathy 08/27/2017  . Allergic rhinitis 08/09/2017  . Disorder of lipoid metabolism 08/09/2017  . Insomnia secondary to anxiety 08/09/2017  . Postmenopausal osteoporosis 08/09/2017  . History of hysterectomy for benign disease 08/09/2017  . GAD (generalized anxiety disorder) 08/09/2017  . Former smoker, stopped smoking in distant past 08/09/2017  . Minor depressive disorder 08/09/2017  . Palpitations 03/16/2015  . Family history of colon cancer 03/02/2015  . Gastroesophageal reflux disease 03/02/2015  . History of adenomatous polyp of colon 03/02/2015  . Chronic migraine 10/10/2014  . History of cardiovascular disorder 01/18/2003    Past Surgical History:  Procedure Laterality Date  . ANAL FISSURE REPAIR    . APPENDECTOMY    . COLONOSCOPY W/ POLYPECTOMY  03/2015  . KNEE SURGERY    . OOPHORECTOMY    . [place]  . PELVIC LAPAROSCOPY     x 4 for endometriosis   . VAGINAL HYSTERECTOMY      OB History   None      Home Medications    Prior to Admission medications   Medication Sig Start Date End Date Taking? Authorizing Provider  aspirin-acetaminophen-caffeine (EXCEDRIN MIGRAINE) [phone] MG tablet Take by mouth.    [provider]  BOTOX 200 units SOLR  12/18/17   [provider]  Calcium Carbonate-Vitamin D 600-400 MG-UNIT tablet Take 1 tablet by mouth 2 (two) times  daily. 08/09/17   Trixie Dredge, PA-C  Coenzyme Q10 (CO Q 10 PO) Take by mouth.    [provider]  Cyanocobalamin (B-12 PO) Take by mouth.    [provider]  Diclofenac Potassium (CAMBIA) 50 MG PACK Take 50 mg by mouth as directed. Patient not taking: Reported on 04/29/2018 11/19/17   Pieter Partridge, DO  ergotamine-caffeine (CAFERGOT) 1-100 MG tablet Take 1 to 2 tablets earliest onset of headache.  May repeat every 60 minutes as  needed, maximum of 4 tablets in 24 hours 04/29/18   Jaffe, Adam R, DO  eszopiclone (LUNESTA) 1 MG TABS tablet Take 1 tablet (1 mg total) by mouth at bedtime as needed for sleep. Take immediately before bedtime 03/27/18   Trixie Dredge, PA-C  ketorolac (ACULAR) 0.5 % ophthalmic solution Place 1 drop into the right eye 4 (four) times daily. 05/04/18   Kandra Nicolas, MD  Ketorolac Tromethamine (SPRIX) 15.75 MG/SPRAY SOLN Place 1 spray into the nose as directed.    [provider]  MAGNESIUM PO Take by mouth.    [provider]  nadolol (CORGARD) 20 MG tablet Take 1 tablet (20 mg total) by mouth daily. Due for follow up with PCP 03/27/18   Trixie Dredge, PA-C  omeprazole (PRILOSEC) 40 MG capsule Take 20 mg by mouth daily.    [provider]  Riboflavin 400 MG CAPS Take 1 capsule by mouth daily.    [provider]  tiZANidine (ZANAFLEX) 4 MG tablet Take 1 tablet (4 mg total) by mouth at bedtime as needed for muscle spasms. 01/20/18   Pieter Partridge, DO  topiramate (TOPAMAX) 100 MG tablet Take 1.5 tablets (150 mg total) by mouth at bedtime. 04/29/18   Pieter Partridge, DO    Family History Family History  Problem Relation Age of Onset  . Colon cancer Mother   . Breast cancer Mother 64  . Hyperlipidemia Father   . Alcohol abuse Father   . Heart attack Father   . Hyperlipidemia Brother   . Lung cancer Maternal Grandmother   . Parkinson's disease Maternal Grandfather   . Dementia Maternal Grandfather   . Parkinson's disease Paternal Grandmother   . Heart attack Paternal Grandfather     Social History Social History   Tobacco Use  . Smoking status: Former Smoker    Last attempt to quit: 08/09/2006    Years since quitting: 11.7  . Smokeless tobacco: Never Used  Substance Use Topics  . Alcohol use: Yes  . Drug use: No     Allergies   Sulfa antibiotics and Sumatriptan succinate   Review of Systems Review of Systems  Eyes:  Positive for discharge and redness. Negative for blurred vision, double vision, photophobia and itching.  Neurological: Negative for headaches.  All other systems reviewed and are negative.    Physical Exam Triage Vital Signs ED Triage Vitals  Enc Vitals Group     BP 05/04/18 1423 117/77     Pulse Rate 05/04/18 1423 63     Resp 05/04/18 1423 16     Temp 05/04/18 1423 98.5 F (36.9 C)     Temp Source 05/04/18 1423 Oral     SpO2 05/04/18 1423 100 %     Weight 05/04/18 1424 150 lb (68 kg)     Height 05/04/18 1424 5\' 8"  (1.727 m)     Head Circumference --      Peak Flow --  Pain Score 05/04/18 1424 0     Pain Loc --      Pain Edu? --      Excl. in [place]? --    No data found.  Updated Vital Signs BP 117/77 (BP Location: Right Arm)   Pulse 63   Temp 98.5 F (36.9 C) (Oral)   Resp 16   Ht 5\' 8"  (1.727 m)   Wt 150 lb (68 kg)   SpO2 100%   BMI 22.81 kg/m   Visual Acuity Right Eye Distance:   Left Eye Distance:   Bilateral Distance:    Right Eye Near:   Left Eye Near:    Bilateral Near:     Physical Exam  Constitutional: She appears well-developed and well-nourished. No distress.  HENT:  Head: Normocephalic.  Right Ear: Tympanic membrane, external ear and ear canal normal.  Left Ear: Tympanic membrane, external ear and ear canal normal.  Nose: Nose normal.  Mouth/Throat: Oropharynx is clear and moist.  Eyes: Pupils are equal, round, and reactive to light. EOM and lids are normal. Right eye exhibits no chemosis, no discharge, no exudate and no hordeolum. No foreign body present in the right eye.    Right eye has mild injection of episcleral vessels medially and laterally.  Neck: Neck supple.  Cardiovascular: Normal rate.  Pulmonary/Chest: Effort normal.  Lymphadenopathy:    She has no cervical adenopathy.  Neurological: She is alert.  Skin: Skin is warm and dry. No rash noted.  Nursing note and vitals reviewed.    UC Treatments / Results  Labs (all labs  ordered are listed, but only abnormal results are displayed) Labs Reviewed - No data to display  EKG None  Radiology No results found.  Procedures Procedures (including critical care time)  Medications Ordered in UC Medications - No data to display  Initial Impression / Assessment and Plan / UC Course  I have reviewed the triage vital signs and the nursing notes.  Pertinent labs & imaging results that were available during my care of the patient were reviewed by me and considered in my medical decision making (see chart for details).    Begin Acular ophthalmic solution. Followup with ophthalmologist if not improved 4 to 5 days.   Final Clinical Impressions(s) / UC Diagnoses   Final diagnoses:  Episcleritis of right eye     Discharge Instructions     May also use lubrication eye drops such as Refresh Tears several times daily.    ED Prescriptions    Medication Sig Dispense Auth. Provider   ketorolac (ACULAR) 0.5 % ophthalmic solution Place 1 drop into the right eye 4 (four) times daily. 3 mL Kandra Nicolas, MD        Kandra Nicolas, MD 05/12/18 [phone]

## 2018-05-04 NOTE — Discharge Instructions (Addendum)
May also use lubrication eye drops such as Refresh Tears several times daily.

## 2018-05-05 ENCOUNTER — Ambulatory Visit: Payer: 59 | Admitting: Physician Assistant

## 2018-06-06 MED FILL — TOPIRAMATE 100 MG TABLET: 100 | 30 days supply | Qty: 45 | Fill #1

## 2018-06-06 MED FILL — ESZOPICLONE 1 MG TABLET: 1 | 30 days supply | Qty: 30 | Fill #2

## 2018-06-06 MED FILL — ERGOTAMINE-CAFFEINE 1-100MG: 1-100 | 25 days supply | Qty: 32 | Fill #1

## 2018-06-23 ENCOUNTER — Ambulatory Visit: Payer: 59 | Admitting: Physician Assistant

## 2018-06-23 ENCOUNTER — Telehealth: Payer: Self-pay | Admitting: Physician Assistant

## 2018-06-23 ENCOUNTER — Encounter: Payer: Self-pay | Admitting: Physician Assistant

## 2018-06-23 ENCOUNTER — Other Ambulatory Visit: Payer: Self-pay

## 2018-06-23 VITALS — BP 115/75 | HR 64 | Wt 151.0 lb

## 2018-06-23 DIAGNOSIS — F419 Anxiety disorder, unspecified: Secondary | ICD-10-CM | POA: Diagnosis not present

## 2018-06-23 DIAGNOSIS — R4189 Other symptoms and signs involving cognitive functions and awareness: Secondary | ICD-10-CM | POA: Diagnosis not present

## 2018-06-23 DIAGNOSIS — G43709 Chronic migraine without aura, not intractable, without status migrainosus: Secondary | ICD-10-CM

## 2018-06-23 DIAGNOSIS — Z130 Encounter for screening for diseases of the blood and blood-forming organs and certain disorders involving the immune mechanism: Secondary | ICD-10-CM | POA: Diagnosis not present

## 2018-06-23 DIAGNOSIS — Z1329 Encounter for screening for other suspected endocrine disorder: Secondary | ICD-10-CM | POA: Diagnosis not present

## 2018-06-23 DIAGNOSIS — F5105 Insomnia due to other mental disorder: Principal | ICD-10-CM

## 2018-06-23 DIAGNOSIS — Z13220 Encounter for screening for lipoid disorders: Secondary | ICD-10-CM | POA: Diagnosis not present

## 2018-06-23 DIAGNOSIS — IMO0002 Reserved for concepts with insufficient information to code with codable children: Secondary | ICD-10-CM

## 2018-06-23 DIAGNOSIS — M810 Age-related osteoporosis without current pathological fracture: Secondary | ICD-10-CM | POA: Diagnosis not present

## 2018-06-23 DIAGNOSIS — Z1159 Encounter for screening for other viral diseases: Secondary | ICD-10-CM

## 2018-06-23 DIAGNOSIS — Z131 Encounter for screening for diabetes mellitus: Secondary | ICD-10-CM

## 2018-06-23 MED ORDER — ESZOPICLONE 1 MG PO TABS: 1.0000 mg | ORAL_TABLET | Freq: Every evening | ORAL | 2 refills | Status: DC | PRN

## 2018-06-23 MED ORDER — GALCANEZUMAB-GNLM 120 MG/ML ~~LOC~~ SOAJ: 240.0000 mg | Freq: Once | SUBCUTANEOUS | 0 refills | Status: AC

## 2018-06-23 NOTE — Progress Notes (Signed)
HPI:                                                                Alice Williams is a 57 y.o. female who presents to [facility]: [place] today for insomnia follow-up  Insomnia: she is able to sleep for 5-7 consecutive hours with Lunesta 2 mg nightly. She is using it several nights per week and states sleep quality is improved.   She is also a chronic migraine sufferer and has a pounding right-sided headache today.She self-treated with Excedrin migraine today. History of anaphylaxis with Imitrex. She is concerned about cognitive difficulties, namely trouble with word-finding, intermittent confusion and gait imbalance. She was told this was due to her migraines. She is followed by Neurology and currently on Botox and Topamax 200 mg, has been on this for approximately 3 years, dose was recently increased. She noted no improvement with Botox treatment.    Past Medical History:  Diagnosis Date  . Anxiety   . Breast calcifications on mammogram   . Facet arthropathy, lumbosacral 08/28/2017  . GERD (gastroesophageal reflux disease)   . History of colon polyps   . Inappropriate sinus tachycardia   . Migraines   . Osteopenia   . Osteoporosis of femur without pathological fracture 08/28/2017  . Palpitations   . Premature surgical menopause    Past Surgical History:  Procedure Laterality Date  . ANAL FISSURE REPAIR    . APPENDECTOMY    . COLONOSCOPY W/ POLYPECTOMY  03/2015  . KNEE SURGERY    . OOPHORECTOMY    . [place]  . PELVIC LAPAROSCOPY     x 4 for endometriosis   . VAGINAL HYSTERECTOMY     Social History   Tobacco Use  . Smoking status: Former Smoker    Last attempt to quit: 08/09/2006    Years since quitting: 11.8  . Smokeless tobacco: Never Used  Substance Use Topics  . Alcohol use: Yes   family history includes Alcohol abuse in her father; Breast cancer (age of onset: 85) in her mother; Colon cancer in her  mother; Dementia in her maternal grandfather; Heart attack in her father and paternal grandfather; Hyperlipidemia in her brother and father; Lung cancer in her maternal grandmother; Parkinson's disease in her maternal grandfather and paternal grandmother.    ROS: negative except as noted in the HPI  Medications: Current Outpatient Medications  Medication Sig Dispense Refill  . aspirin-acetaminophen-caffeine (EXCEDRIN MIGRAINE) 250-250-65 MG tablet Take by mouth.    Marland Kitchen BOTOX 200 units SOLR   3  . Calcium Carbonate-Vitamin D 600-400 MG-UNIT tablet Take 1 tablet by mouth 2 (two) times daily. 60 tablet 11  . Coenzyme Q10 (CO Q 10 PO) Take by mouth.    . Cyanocobalamin (B-12 PO) Take by mouth.    . ergotamine-caffeine (CAFERGOT) 1-100 MG tablet Take 1 to 2 tablets earliest onset of headache.  May repeat every 60 minutes as needed, maximum of 4 tablets in 24 hours 32 tablet 2  . eszopiclone (LUNESTA) 1 MG TABS tablet Take 1-2 tablets (1-2 mg total) by mouth at bedtime as needed for sleep. Take immediately before bedtime 30 tablet 2  . MAGNESIUM PO Take by mouth.    Marland Kitchen  nadolol (CORGARD) 20 MG tablet Take 1 tablet (20 mg total) by mouth daily. Due for follow up with PCP 90 tablet 1  . omeprazole (PRILOSEC) 40 MG capsule Take 20 mg by mouth daily.    . Riboflavin 400 MG CAPS Take 1 capsule by mouth daily.    Marland Kitchen tiZANidine (ZANAFLEX) 4 MG tablet Take 1 tablet (4 mg total) by mouth at bedtime as needed for muscle spasms. 30 tablet 0  . topiramate (TOPAMAX) 100 MG tablet Take 1.5 tablets (150 mg total) by mouth at bedtime. 45 tablet 3  . Galcanezumab-gnlm (EMGALITY) 120 MG/ML SOAJ Inject 240 mg into the skin once for 1 dose. 2 pen 0   No current facility-administered medications for this visit.    Allergies  Allergen Reactions  . Sulfa Antibiotics Anaphylaxis  . Sumatriptan Succinate Anaphylaxis       Objective:  BP 115/75   Pulse 64   Wt 151 lb (68.5 kg)   BMI 22.96 kg/m  Gen:  alert, not  ill-appearing, no distress, appropriate for age 54: head normocephalic without obvious abnormality, conjunctiva and cornea clear, trachea midline Pulm: Normal work of breathing, normal phonation Neuro: alert and oriented x 3, no tremor MSK: extremities atraumatic, normal gait and station Skin: intact, no rashes on exposed skin, no jaundice, no cyanosis Psych: well-groomed, cooperative, good eye contact, euthymic mood, affect is not mood-congruent, tearful, speech is articulate, and thought processes clear and goal-directed    No results found for this or any previous visit (from the past 72 hour(s)). No results found.    Assessment and Plan: 57 y.o. female with   .[name] was seen today for medication management.  Diagnoses and all orders for this visit:  Chronic migraine -     Ambulatory referral to Neurology -     Galcanezumab-gnlm (EMGALITY) 120 MG/ML SOAJ; Inject 240 mg into the skin once for 1 dose.  Insomnia secondary to anxiety -     Discontinue: eszopiclone (LUNESTA) 1 MG TABS tablet; Take 1-2 tablets (1-2 mg total) by mouth at bedtime as needed for sleep. Take immediately before bedtime -     eszopiclone (LUNESTA) 1 MG TABS tablet; Take 1-2 tablets (1-2 mg total) by mouth at bedtime as needed for sleep. Take immediately before bedtime  Encounter for hepatitis C screening test for low risk patient -     Hepatitis C antibody  Screening for lipid disorders -     Lipid Panel w/reflex Direct LDL  Screening for blood disease -     CBC -     Comprehensive metabolic panel  Screening for diabetes mellitus -     Comprehensive metabolic panel  Screening for thyroid disorder -     TSH + free T4  Postmenopausal osteoporosis   Insomnia - good response to Lunesta. Cont 1-2 mg QHS prn. She is using this judiciously along with sleep hygiene measures  Osteoporosis Last Prolia 09/06/17 Due for serum calcium level Next bone density due 07/2019 Return in 2 days for nurse  Prolia injection  Chronic migraine - declines migraine cocktail today - referring to headache specialist - she is interested in starting one of the new CGRP antagonists - Emgality 240 mg loading dose sent. Discussed this will likely require a PA with insurance - I think her cognitive difficulties are being exacerbated by the Topamax. Plan to taper this once she has started [employer]. Will defer to headache specialist.  Cognitive changes - recommend f/u [facility] cognitive assessment at next office  visit - I think this will likely improve with Topamax taper - consider neurocognitive labs if symptoms are worsening  Patient education and anticipatory guidance given Patient agrees with treatment plan Follow-up in 6 months for medication management or sooner as needed if symptoms worsen or fail to improve  Darlyne Russian PA-C

## 2018-06-23 NOTE — Patient Instructions (Signed)
Sleep Hygiene . Limiting daytime naps to 30 minutes . Napping does not make up for inadequate nighttime sleep. However, a short nap of 20-30 minutes can help to improve mood, alertness and performance.  . Avoiding stimulants such as  caffeine and nicotine close to bedtime.  And when it comes to alcohol, moderation is key 4. While alcohol is well-known to help you fall asleep faster, too much close to bedtime can disrupt sleep in the second half of the night as the body begins to process the alcohol.    . Exercising to promote good quality sleep.  As little as 10 minutes of aerobic exercise, such as walking or cycling, can drastically improve nighttime sleep quality.  For the best night's sleep, most people should avoid strenuous workouts close to bedtime. However, the effect of intense nighttime exercise on sleep differs from person to person, so find out what works best for you.   . Steering clear of food that can be disruptive right before sleep.   Heavy or rich foods, fatty or fried meals, spicy dishes, citrus fruits, and carbonated drinks can trigger indigestion for some people. When this occurs close to bedtime, it can lead to painful heartburn that disrupts sleep. . Ensuring adequate exposure to natural light.  This is particularly important for individuals who may not venture outside frequently. Exposure to sunlight during the day, as well as darkness at night, helps to maintain a healthy sleep-wake cycle . Marland Kitchen Establishing a regular relaxing bedtime routine.  A regular nightly routine helps the body recognize that it is bedtime. This could include taking warm shower or bath, reading a book, or light stretches. When possible, try to avoid emotionally upsetting conversations and activities before attempting to sleep. . Making sure that the sleep environment is pleasant.  Mattress and pillows should be comfortable. The bedroom should be cool - between 60 and 67 degrees - for optimal sleep. Bright light  from lamps, cell phone and TV screens can make it difficult to fall asleep4, so turn those light off or adjust them when possible. Consider using blackout curtains, eye shades, ear plugs, "white noise" machines, humidifiers, fans and other devices that can make the bedroom more relaxing. . Meditation. YouTube [address]. There are many smartphone apps as well

## 2018-06-23 NOTE — Telephone Encounter (Signed)
Received fax from [employer] that Emgailty requires a PA. Information has been sent to the insurance company. Awaiting determination.

## 2018-06-24 ENCOUNTER — Encounter: Payer: Self-pay | Admitting: Physician Assistant

## 2018-06-24 DIAGNOSIS — R4189 Other symptoms and signs involving cognitive functions and awareness: Secondary | ICD-10-CM | POA: Insufficient documentation

## 2018-06-24 DIAGNOSIS — E785 Hyperlipidemia, unspecified: Secondary | ICD-10-CM | POA: Insufficient documentation

## 2018-06-24 HISTORY — DX: Hyperlipidemia, unspecified: E78.5

## 2018-06-24 LAB — LIPID PANEL W/REFLEX DIRECT LDL
CHOL/HDL RATIO: 5.1 (calc) — AB (ref <5.0)
CHOLESTEROL: 188 mg/dL (ref <200)
HDL: 37 mg/dL — AB (ref >50)
LDL Cholesterol (Calc): 124 mg/dL (calc) — ABNORMAL HIGH
Non-HDL Cholesterol (Calc): 151 mg/dL (calc) — ABNORMAL HIGH (ref <130)
Triglycerides: 155 mg/dL — ABNORMAL HIGH (ref <150)

## 2018-06-24 LAB — COMPREHENSIVE METABOLIC PANEL
AG RATIO: 1.9 (calc) (ref 1.0–2.5)
ALBUMIN MSPROF: 4.2 g/dL (ref 3.6–5.1)
ALKALINE PHOSPHATASE (APISO): 77 U/L (ref 33–130)
ALT: 13 U/L (ref 6–29)
AST: 15 U/L (ref 10–35)
BILIRUBIN TOTAL: 0.4 mg/dL (ref 0.2–1.2)
BUN: 18 mg/dL (ref 7–25)
CHLORIDE: 112 mmol/L — AB (ref 98–110)
CO2: 26 mmol/L (ref 20–32)
CREATININE: 0.97 mg/dL (ref 0.50–1.05)
Calcium: 9.4 mg/dL (ref 8.6–10.4)
GLOBULIN: 2.2 g/dL (ref 1.9–3.7)
Glucose, Bld: 91 mg/dL (ref 65–139)
POTASSIUM: 4.1 mmol/L (ref 3.5–5.3)
Sodium: 142 mmol/L (ref 135–146)
Total Protein: 6.4 g/dL (ref 6.1–8.1)

## 2018-06-24 LAB — CBC
HEMATOCRIT: 39.8 % (ref 35.0–45.0)
HEMOGLOBIN: 13.3 g/dL (ref 11.7–15.5)
MCH: 29.6 pg (ref 27.0–33.0)
MCHC: 33.4 g/dL (ref 32.0–36.0)
MCV: 88.4 fL (ref 80.0–100.0)
MPV: 10.8 fL (ref 7.5–12.5)
Platelets: 220 10*3/uL (ref 140–400)
RBC: 4.5 10*6/uL (ref 3.80–5.10)
RDW: 13 % (ref 11.0–15.0)
WBC: 7 10*3/uL (ref 3.8–10.8)

## 2018-06-24 LAB — HEPATITIS C ANTIBODY
HEP C AB: NONREACTIVE
SIGNAL TO CUT-OFF: 0.01 (ref <1.00)

## 2018-06-24 LAB — TSH+FREE T4: TSH W/REFLEX TO FT4: 0.8 m[IU]/L (ref 0.40–4.50)

## 2018-06-24 MED FILL — EMGALITY 120 MG/ML SOAJ: 120 | 30 days supply | Qty: 2 | Fill #0

## 2018-06-24 NOTE — Telephone Encounter (Signed)
Received fax from [place] that Emgality was approved from 06/23/2018 through 12/15/2018. Pharmacy notified. Form sent to scan. -hsm

## 2018-06-26 ENCOUNTER — Ambulatory Visit (INDEPENDENT_AMBULATORY_CARE_PROVIDER_SITE_OTHER): Payer: 59 | Admitting: Physician Assistant

## 2018-06-26 VITALS — BP 103/61 | HR 72

## 2018-06-26 DIAGNOSIS — M810 Age-related osteoporosis without current pathological fracture: Secondary | ICD-10-CM | POA: Diagnosis not present

## 2018-06-26 DIAGNOSIS — G43709 Chronic migraine without aura, not intractable, without status migrainosus: Secondary | ICD-10-CM | POA: Diagnosis not present

## 2018-06-26 DIAGNOSIS — Z7189 Other specified counseling: Secondary | ICD-10-CM | POA: Diagnosis not present

## 2018-06-26 DIAGNOSIS — R000 Tachycardia, unspecified: Secondary | ICD-10-CM | POA: Diagnosis not present

## 2018-06-26 MED ORDER — DENOSUMAB 60 MG/ML ~~LOC~~ SOSY
60.0000 mg | PREFILLED_SYRINGE | Freq: Once | SUBCUTANEOUS | Status: AC
  Administered 2018-06-26: 60 mg via SUBCUTANEOUS

## 2018-06-26 MED FILL — BACLOFEN 10 MG TABLET: 10 | 2 days supply | Qty: 20 | Fill #0

## 2018-06-26 MED FILL — TROKENDI XR 200 MG CAPSULE: 200 | 30 days supply | Qty: 30 | Fill #0

## 2018-06-26 NOTE — Progress Notes (Signed)
Pt came into clinic today for second Prolia injection. She is a little off schedule, but most recent labs were within range. Tolerated injection in left arm well, no immediate complications. Calendar reminder provided for next injection. Pt did go see the headache specialist she was referred to today, and I have updated Pt's Rx list. No further questions or concerns.

## 2018-07-17 MED FILL — NADOLOL 20 MG TAB: 20 | 90 days supply | Qty: 90 | Fill #0

## 2018-07-18 MED FILL — EMGALITY 120 MG/ML SOAJ: 120 | 30 days supply | Qty: 1 | Fill #0

## 2018-07-22 MED FILL — BOTOX 200 UNITS VIAL: 200 | 1 days supply | Qty: 1 | Fill #0

## 2018-07-22 MED FILL — TROKENDI XR 200 MG CAPSULE: 200 | 30 days supply | Qty: 30 | Fill #1

## 2018-07-25 ENCOUNTER — Telehealth: Payer: Self-pay | Admitting: Physician Assistant

## 2018-07-25 NOTE — Telephone Encounter (Signed)
Prior Authorization is not required for this medication Maretta Bees) dosage form and strength at the quantity and days supply requested.

## 2018-08-01 ENCOUNTER — Ambulatory Visit
Admission: RE | Admit: 2018-08-01 | Discharge: 2018-08-01 | Disposition: A | Discharge status: Home / Self Care | Payer: 59 | Source: Ambulatory Visit | Attending: Physician Assistant | Admitting: Physician Assistant

## 2018-08-01 DIAGNOSIS — R922 Inconclusive mammogram: Secondary | ICD-10-CM

## 2018-08-01 DIAGNOSIS — N6311 Unspecified lump in the right breast, upper outer quadrant: Secondary | ICD-10-CM | POA: Diagnosis not present

## 2018-08-01 DIAGNOSIS — Z1239 Encounter for other screening for malignant neoplasm of breast: Secondary | ICD-10-CM

## 2018-08-01 MED ORDER — GADOBENATE DIMEGLUMINE 529 MG/ML IV SOLN
14.0000 mL | Freq: Once | INTRAVENOUS | Status: AC | PRN
  Administered 2018-08-01: 14 mL via INTRAVENOUS

## 2018-08-06 ENCOUNTER — Ambulatory Visit: Payer: 59 | Admitting: Neurology

## 2018-08-07 ENCOUNTER — Other Ambulatory Visit: Payer: Self-pay | Admitting: Osteopathic Medicine

## 2018-08-07 DIAGNOSIS — R928 Other abnormal and inconclusive findings on diagnostic imaging of breast: Secondary | ICD-10-CM

## 2018-08-08 ENCOUNTER — Other Ambulatory Visit: Payer: Self-pay | Admitting: Osteopathic Medicine

## 2018-08-08 DIAGNOSIS — R928 Other abnormal and inconclusive findings on diagnostic imaging of breast: Secondary | ICD-10-CM

## 2018-08-08 DIAGNOSIS — G43709 Chronic migraine without aura, not intractable, without status migrainosus: Secondary | ICD-10-CM | POA: Diagnosis not present

## 2018-08-08 MED FILL — ESZOPICLONE 1 MG TABLET: 1 | 30 days supply | Qty: 30 | Fill #0

## 2018-08-08 MED FILL — BOTOX 200 UNITS VIAL: 200 | 1 days supply | Qty: 1 | Fill #0

## 2018-08-12 ENCOUNTER — Ambulatory Visit
Admission: RE | Admit: 2018-08-12 | Discharge: 2018-08-12 | Disposition: A | Discharge status: Home / Self Care | Payer: 59 | Source: Ambulatory Visit | Attending: Osteopathic Medicine | Admitting: Osteopathic Medicine

## 2018-08-12 ENCOUNTER — Encounter: Payer: Self-pay | Admitting: Physician Assistant

## 2018-08-12 DIAGNOSIS — N6310 Unspecified lump in the right breast, unspecified quadrant: Secondary | ICD-10-CM

## 2018-08-12 DIAGNOSIS — R928 Other abnormal and inconclusive findings on diagnostic imaging of breast: Secondary | ICD-10-CM

## 2018-08-12 DIAGNOSIS — N6489 Other specified disorders of breast: Secondary | ICD-10-CM | POA: Diagnosis not present

## 2018-08-19 MED FILL — TROKENDI XR 200 MG CAPSULE: 200 | 30 days supply | Qty: 30 | Fill #2

## 2018-08-19 MED FILL — EMGALITY 120 MG/ML SOAJ: 120 | 30 days supply | Qty: 1 | Fill #1

## 2018-08-20 ENCOUNTER — Ambulatory Visit
Admission: RE | Admit: 2018-08-20 | Discharge: 2018-08-20 | Disposition: A | Discharge status: Home / Self Care | Payer: 59 | Source: Ambulatory Visit | Attending: Physician Assistant | Admitting: Physician Assistant

## 2018-08-20 ENCOUNTER — Ambulatory Visit
Admission: RE | Admit: 2018-08-20 | Discharge: 2018-08-20 | Disposition: A | Discharge status: Home / Self Care | Payer: 59 | Source: Ambulatory Visit | Attending: Osteopathic Medicine | Admitting: Osteopathic Medicine

## 2018-08-20 DIAGNOSIS — R928 Other abnormal and inconclusive findings on diagnostic imaging of breast: Secondary | ICD-10-CM

## 2018-08-20 DIAGNOSIS — N6310 Unspecified lump in the right breast, unspecified quadrant: Secondary | ICD-10-CM | POA: Diagnosis not present

## 2018-08-20 DIAGNOSIS — N6011 Diffuse cystic mastopathy of right breast: Secondary | ICD-10-CM | POA: Diagnosis not present

## 2018-08-20 MED ORDER — GADOBENATE DIMEGLUMINE 529 MG/ML IV SOLN
14.0000 mL | Freq: Once | INTRAVENOUS | Status: AC | PRN
  Administered 2018-08-20: 14 mL via INTRAVENOUS

## 2018-08-21 ENCOUNTER — Encounter: Payer: Self-pay | Admitting: Physician Assistant

## 2018-08-21 DIAGNOSIS — N6099 Unspecified benign mammary dysplasia of unspecified breast: Secondary | ICD-10-CM | POA: Insufficient documentation

## 2018-08-22 ENCOUNTER — Encounter: Payer: Self-pay | Admitting: Physician Assistant

## 2018-08-22 MED FILL — BACLOFEN 10 MG TABS: 10 | 2 days supply | Qty: 20 | Fill #1

## 2018-08-29 ENCOUNTER — Encounter: Payer: Self-pay | Admitting: Physician Assistant

## 2018-08-29 ENCOUNTER — Ambulatory Visit: Payer: 59 | Admitting: Physician Assistant

## 2018-08-29 VITALS — BP 142/81 | HR 63 | Temp 97.5°F | Wt 145.0 lb

## 2018-08-29 DIAGNOSIS — R1013 Epigastric pain: Secondary | ICD-10-CM

## 2018-08-29 DIAGNOSIS — R8271 Bacteriuria: Secondary | ICD-10-CM | POA: Diagnosis not present

## 2018-08-29 MED ORDER — SUCRALFATE 1 G PO TABS: 1.0000 g | ORAL_TABLET | Freq: Two times a day (BID) | ORAL | 0 refills | Status: DC

## 2018-08-29 MED ORDER — ESOMEPRAZOLE MAGNESIUM 40 MG PO PACK: 40.0000 mg | PACK | Freq: Every day | ORAL | 0 refills | Status: DC

## 2018-08-29 MED ORDER — ALUM & MAG HYDROXIDE-SIMETH 200-200-20 MG/5ML PO SUSP
30.0000 mL | Freq: Once | ORAL | Status: AC
  Administered 2018-08-29: 30 mL via ORAL

## 2018-08-29 MED FILL — SUCRALFATE 1 GM TABLET: 1 | 30 days supply | Qty: 60 | Fill #0

## 2018-08-29 MED FILL — ESOMEPRAZOLE MAG DR 40 MG C: 40 | 30 days supply | Qty: 30 | Fill #0

## 2018-08-29 NOTE — Patient Instructions (Signed)
Low-Fat Diet for Pancreatitis or Gallbladder Conditions A low-fat diet can be helpful if you have pancreatitis or a gallbladder condition. With these conditions, your pancreas and gallbladder have trouble digesting fats. A healthy eating plan with less fat will help rest your pancreas and gallbladder and reduce your symptoms. What do I need to know about this diet?  Eat a low-fat diet. ? Reduce your fat intake to less than 20-30% of your total daily calories. This is less than 50-60 g of fat per day. ? Remember that you need some fat in your diet. Ask your dietician what your daily goal should be. ? Choose nonfat and low-fat healthy foods. Look for the words "nonfat," "low fat," or "fat free." ? As a guide, look on the label and choose foods with less than 3 g of fat per serving. Eat only one serving.  Avoid alcohol.  Do not smoke. If you need help quitting, talk with your health care provider.  Eat small frequent meals instead of three large heavy meals. What foods can I eat? Grains Include healthy grains and starches such as potatoes, wheat bread, fiber-rich cereal, and brown rice. Choose whole grain options whenever possible. In adults, whole grains should account for 45-65% of your daily calories. Fruits and Vegetables Eat plenty of fruits and vegetables. Fresh fruits and vegetables add fiber to your diet. Meats and Other Protein Sources Eat lean meat such as chicken and pork. Trim any fat off of meat before cooking it. Eggs, fish, and beans are other sources of protein. In adults, these foods should account for 10-35% of your daily calories. Dairy Choose low-fat milk and dairy options. Dairy includes fat and protein, as well as calcium. Fats and Oils Limit high-fat foods such as fried foods, sweets, baked goods, sugary drinks. Other Creamy sauces and condiments, such as mayonnaise, can add extra fat. Think about whether or not you need to use them, or use smaller amounts or low fat  options. What foods are not recommended?  High fat foods, such as: ? Baked goods. ? Ice cream. ? French toast. ? Sweet rolls. ? Pizza. ? Cheese bread. ? Foods covered with batter, butter, creamy sauces, or cheese. ? Fried foods. ? Sugary drinks and desserts.  Foods that cause gas or bloating This information is not intended to replace advice given to you by your health care provider. Make sure you discuss any questions you have with your health care provider. Document Released: 10/20/2013 Document Revised: 03/22/2016 Document Reviewed: 09/28/2013 Elsevier Interactive Patient Education  2017 [employer]  

## 2018-08-29 NOTE — Progress Notes (Signed)
HPI:                                                                Alice Williams is a 57 y.o. female who presents to [place]: [place] today for nausea/vomiting/abd pain  Abdominal Pain  This is a new problem. The current episode started more than 1 month ago. The onset quality is undetermined. The problem occurs daily. The problem has been gradually worsening. The pain is located in the epigastric region. The abdominal pain radiates to the back and RUQ. Associated symptoms include anorexia, belching, constipation, diarrhea, nausea and vomiting ("yeasty taste"). Pertinent negatives include no dysuria, hematochezia or melena. The pain is aggravated by eating (fries, pizza). The pain is relieved by nothing. She has tried proton pump inhibitors (Zegerid) for the symptoms. The treatment provided no relief. Prior diagnostic workup includes upper endoscopy.   EGD 2016 showed mild erosive gastritis, has been taking Prilosec 20 mg since that time  She was taking Excedrin fairly often for migraines, but states she backed off around 2 months ago.   Past Medical History:  Diagnosis Date  . Anxiety   . Breast calcifications on mammogram   . Dyslipidemia 06/24/2018  . Facet arthropathy, lumbosacral 08/28/2017  . GERD (gastroesophageal reflux disease)   . History of colon polyps   . Inappropriate sinus tachycardia   . Migraines   . Osteopenia   . Osteoporosis of femur without pathological fracture 08/28/2017  . Palpitations   . Premature surgical menopause    Past Surgical History:  Procedure Laterality Date  . ANAL FISSURE REPAIR    . APPENDECTOMY    . COLONOSCOPY W/ POLYPECTOMY  03/2015  . KNEE SURGERY    . OOPHORECTOMY    . [place]  . PELVIC LAPAROSCOPY     x 4 for endometriosis   . VAGINAL HYSTERECTOMY     Social History   Tobacco Use  . Smoking status: Former Smoker    Last attempt to quit: 08/09/2006    Years since  quitting: 12.0  . Smokeless tobacco: Never Used  Substance Use Topics  . Alcohol use: Yes   family history includes Alcohol abuse in her father; Breast cancer (age of onset: 52) in her mother; Colon cancer in her mother; Dementia in her maternal grandfather; Heart attack in her father and paternal grandfather; Hyperlipidemia in her brother and father; Lung cancer in her maternal grandmother; Parkinson's disease in her maternal grandfather and paternal grandmother.    ROS: negative except as noted in the HPI  Medications: Current Outpatient Medications  Medication Sig Dispense Refill  . baclofen (LIORESAL) 10 MG tablet Take 1 tablet by mouth 3 (three) times daily as needed.  2  . BOTOX 200 units SOLR   3  . Calcium Carbonate-Vitamin D 600-400 MG-UNIT tablet Take 1 tablet by mouth 2 (two) times daily. 60 tablet 11  . Coenzyme Q10 (CO Q 10 PO) Take by mouth.    . Cyanocobalamin (B-12 PO) Take by mouth.    . EMGALITY 120 MG/ML SOAJ   11  . esomeprazole (NEXIUM) 40 MG packet Take 40 mg by mouth daily before breakfast. 30 each 0  . eszopiclone (LUNESTA) 1 MG TABS tablet  Take 1-2 tablets (1-2 mg total) by mouth at bedtime as needed for sleep. Take immediately before bedtime 30 tablet 2  . MAGNESIUM PO Take by mouth.    . nadolol (CORGARD) 20 MG tablet Take 1 tablet (20 mg total) by mouth daily. Due for follow up with PCP 90 tablet 1  . Riboflavin 400 MG CAPS Take 1 capsule by mouth daily.    . sucralfate (CARAFATE) 1 g tablet Take 1 tablet (1 g total) by mouth 2 (two) times daily. 60 tablet 0  . TROKENDI XR 200 MG CP24 Take 200 mg by mouth at bedtime.  3   No current facility-administered medications for this visit.    Allergies  Allergen Reactions  . Sulfa Antibiotics Anaphylaxis  . Sumatriptan Succinate Anaphylaxis       Objective:  BP (!) 142/81   Pulse 63   Temp (!) 97.5 F (36.4 C) (Oral)   Wt 145 lb (65.8 kg)   BMI 22.05 kg/m  Gen:  alert, not ill-appearing, no distress,  appropriate for age 17: head normocephalic without obvious abnormality, conjunctiva and cornea clear, trachea midline Pulm: Normal work of breathing, normal phonation, clear to auscultation bilaterally, no wheezes, rales or rhonchi GI: abdomen soft, there is tenderness of the left lumbar quadrant, no guarding or rebound, negative Murphy's sign CV: Normal rate, regular rhythm, s1 and s2 distinct, no murmurs, clicks or rubs  Neuro: alert and oriented x 3, no tremor MSK: extremities atraumatic, normal gait and station Skin: intact, no rashes on exposed skin, no jaundice, no cyanosis  No results found for this or any previous visit (from the past 72 hour(s)). No results found.    Assessment and Plan: 57 y.o. female with   .[name] was seen today for diarrhea and emesis.  Diagnoses and all orders for this visit:  Acute epigastric pain -     US Abdomen Complete; Future -     CBC with Differential/Platelet -     Comprehensive metabolic panel -     Lipase -     Urinalysis, Routine w reflex microscopic -     esomeprazole (NEXIUM) 40 MG packet; Take 40 mg by mouth daily before breakfast. -     sucralfate (CARAFATE) 1 g tablet; Take 1 tablet (1 g total) by mouth 2 (two) times daily. -     alum & mag hydroxide-simeth (MAALOX/MYLANTA) 200-200-20 MG/5ML suspension 30 mL  Dyspepsia -     esomeprazole (NEXIUM) 40 MG packet; Take 40 mg by mouth daily before breakfast. -     sucralfate (CARAFATE) 1 g tablet; Take 1 tablet (1 g total) by mouth 2 (two) times daily. -     alum & mag hydroxide-simeth (MAALOX/MYLANTA) 200-200-20 MG/5ML suspension 30 mL  Afebrile, no tachypnea, no tachycardia, benign abdominal exam. Broad differential including acute gastritis, esophagitis, cholecystitis, pancreatitis, peptic ulcer disease.  CBC, CMP, lipase, UA pending.  Will obtain abdominal ultrasound to assess biliary tree and pancreas. Discontinue omeprazole and switch to Nexium 40 mg daily.  Will also add  Carafate 1 g twice daily.  Patient counseled to follow a low-fat diet. Given her weight loss, I will have low threshold to refer to GI for EGD Follow-up in 2 weeks   Patient education and anticipatory guidance given Patient agrees with treatment plan Follow-up as needed if symptoms worsen or fail to improve  Darlyne Russian PA-C

## 2018-08-30 LAB — COMPREHENSIVE METABOLIC PANEL
AG Ratio: 2.3 (calc) (ref 1.0–2.5)
ALBUMIN MSPROF: 4.3 g/dL (ref 3.6–5.1)
ALKALINE PHOSPHATASE (APISO): 63 U/L (ref 33–130)
ALT: 13 U/L (ref 6–29)
AST: 17 U/L (ref 10–35)
BUN: 17 mg/dL (ref 7–25)
CO2: 22 mmol/L (ref 20–32)
CREATININE: 0.92 mg/dL (ref 0.50–1.05)
Calcium: 8.9 mg/dL (ref 8.6–10.4)
Chloride: 113 mmol/L — ABNORMAL HIGH (ref 98–110)
Globulin: 1.9 g/dL (calc) (ref 1.9–3.7)
Glucose, Bld: 83 mg/dL (ref 65–99)
Potassium: 3.8 mmol/L (ref 3.5–5.3)
SODIUM: 142 mmol/L (ref 135–146)
TOTAL PROTEIN: 6.2 g/dL (ref 6.1–8.1)
Total Bilirubin: 0.5 mg/dL (ref 0.2–1.2)

## 2018-08-30 LAB — URINALYSIS, ROUTINE W REFLEX MICROSCOPIC
BILIRUBIN URINE: NEGATIVE
GLUCOSE, UA: NEGATIVE
HGB URINE DIPSTICK: NEGATIVE
KETONES UR: NEGATIVE
NITRITE: NEGATIVE
Protein, ur: NEGATIVE
Specific Gravity, Urine: 1.021 (ref 1.001–1.03)
pH: <=5 (ref 5.0–8.0)

## 2018-08-30 LAB — CBC WITH DIFFERENTIAL/PLATELET
BASOS PCT: 0.6 %
Basophils Absolute: 64 cells/uL (ref 0–200)
EOS PCT: 3.2 %
Eosinophils Absolute: 339 cells/uL (ref 15–500)
HEMATOCRIT: 39.9 % (ref 35.0–45.0)
HEMOGLOBIN: 13.7 g/dL (ref 11.7–15.5)
Lymphs Abs: 1919 cells/uL (ref 850–3900)
MCH: 29.4 pg (ref 27.0–33.0)
MCHC: 34.3 g/dL (ref 32.0–36.0)
MCV: 85.6 fL (ref 80.0–100.0)
MPV: 10.5 fL (ref 7.5–12.5)
Monocytes Relative: 4.6 %
NEUTROS ABS: 7791 {cells}/uL (ref 1500–7800)
Neutrophils Relative %: 73.5 %
Platelets: 252 10*3/uL (ref 140–400)
RBC: 4.66 10*6/uL (ref 3.80–5.10)
RDW: 12.9 % (ref 11.0–15.0)
TOTAL LYMPHOCYTE: 18.1 %
WBC: 10.6 10*3/uL (ref 3.8–10.8)
WBCMIX: 488 {cells}/uL (ref 200–950)

## 2018-08-30 LAB — LIPASE: Lipase: 12 U/L (ref 7–60)

## 2018-09-01 ENCOUNTER — Other Ambulatory Visit: Payer: Self-pay | Admitting: Physician Assistant

## 2018-09-01 ENCOUNTER — Ambulatory Visit (INDEPENDENT_AMBULATORY_CARE_PROVIDER_SITE_OTHER): Payer: 59

## 2018-09-01 DIAGNOSIS — R1013 Epigastric pain: Secondary | ICD-10-CM

## 2018-09-01 DIAGNOSIS — R8271 Bacteriuria: Secondary | ICD-10-CM | POA: Diagnosis not present

## 2018-09-01 DIAGNOSIS — R1011 Right upper quadrant pain: Secondary | ICD-10-CM | POA: Diagnosis not present

## 2018-09-01 DIAGNOSIS — R112 Nausea with vomiting, unspecified: Secondary | ICD-10-CM

## 2018-09-01 NOTE — Addendum Note (Signed)
Addended by: Nelson Chimes E on: 09/01/2018 08:09 AM   Modules accepted: Orders

## 2018-09-02 LAB — URINE CULTURE
MICRO NUMBER: 91326536
Result:: NO GROWTH
SPECIMEN QUALITY:: ADEQUATE

## 2018-09-03 ENCOUNTER — Encounter: Payer: Self-pay | Admitting: Physician Assistant

## 2018-09-12 ENCOUNTER — Ambulatory Visit: Payer: 59 | Admitting: Physician Assistant

## 2018-09-12 ENCOUNTER — Encounter: Payer: Self-pay | Admitting: Physician Assistant

## 2018-09-12 VITALS — BP 100/63 | HR 60 | Wt 147.0 lb

## 2018-09-12 DIAGNOSIS — K2100 Gastro-esophageal reflux disease with esophagitis, without bleeding: Secondary | ICD-10-CM

## 2018-09-12 MED ORDER — ESOMEPRAZOLE MAGNESIUM 20 MG PO CPDR: 20.0000 mg | DELAYED_RELEASE_CAPSULE | Freq: Every day | ORAL | 1 refills | Status: DC

## 2018-09-12 MED FILL — ESOMEPRAZOLE MAGNESIUM 20 M: 20 | 90 days supply | Qty: 90 | Fill #0

## 2018-09-12 NOTE — Progress Notes (Signed)
HPI:                                                                Alice Williams is a 57 y.o. female who presents to [place]: [place] today for epigastric pain f/u  Since last OV  - 09/01/18 normal abdominal US - urine culture negative - unremarkable CBC, CMP, lipase - she was placed on Esomeprazole 40 mg and Sucralfate twice a day. Reports she is feeling much better. She is less nauseated, no vomiting, less epigastric pain   Past Medical History:  Diagnosis Date  . Anxiety   . Breast calcifications on mammogram   . Dyslipidemia 06/24/2018  . Facet arthropathy, lumbosacral 08/28/2017  . GERD (gastroesophageal reflux disease)   . History of colon polyps   . Inappropriate sinus tachycardia   . Migraines   . Osteopenia   . Osteoporosis of femur without pathological fracture 08/28/2017  . Palpitations   . Premature surgical menopause    Past Surgical History:  Procedure Laterality Date  . ANAL FISSURE REPAIR    . APPENDECTOMY    . COLONOSCOPY W/ POLYPECTOMY  03/2015  . KNEE SURGERY    . OOPHORECTOMY    . [place]  . PELVIC LAPAROSCOPY     x 4 for endometriosis   . VAGINAL HYSTERECTOMY     Social History   Tobacco Use  . Smoking status: Former Smoker    Last attempt to quit: 08/09/2006    Years since quitting: 12.1  . Smokeless tobacco: Never Used  Substance Use Topics  . Alcohol use: Yes   family history includes Alcohol abuse in her father; Breast cancer (age of onset: 21) in her mother; Colon cancer in her mother; Dementia in her maternal grandfather; Heart attack in her father and paternal grandfather; Hyperlipidemia in her brother and father; Lung cancer in her maternal grandmother; Parkinson's disease in her maternal grandfather and paternal grandmother.    ROS: negative except as noted in the HPI  Medications: Current Outpatient Medications  Medication Sig Dispense Refill  . baclofen (LIORESAL)  10 MG tablet Take 1 tablet by mouth 3 (three) times daily as needed.  2  . BOTOX 200 units SOLR   3  . Calcium Carbonate-Vitamin D 600-400 MG-UNIT tablet Take 1 tablet by mouth 2 (two) times daily. 60 tablet 11  . Coenzyme Q10 (CO Q 10 PO) Take by mouth.    . Cyanocobalamin (B-12 PO) Take by mouth.    . EMGALITY 120 MG/ML SOAJ   11  . esomeprazole (NEXIUM) 40 MG packet Take 40 mg by mouth daily before breakfast. 30 each 0  . eszopiclone (LUNESTA) 1 MG TABS tablet Take 1-2 tablets (1-2 mg total) by mouth at bedtime as needed for sleep. Take immediately before bedtime 30 tablet 2  . MAGNESIUM PO Take by mouth.    . nadolol (CORGARD) 20 MG tablet Take 1 tablet (20 mg total) by mouth daily. Due for follow up with PCP 90 tablet 1  . Riboflavin 400 MG CAPS Take 1 capsule by mouth daily.    . sucralfate (CARAFATE) 1 g tablet Take 1 tablet (1 g total) by mouth 2 (two) times daily. 60 tablet 0  .  TROKENDI XR 200 MG CP24 Take 200 mg by mouth at bedtime.  3   No current facility-administered medications for this visit.    Allergies  Allergen Reactions  . Sulfa Antibiotics Anaphylaxis  . Sumatriptan Succinate Anaphylaxis       Objective:  BP 100/63   Pulse 60   Wt 147 lb (66.7 kg)   BMI 22.35 kg/m  Gen:  alert, not ill-appearing, no distress, appropriate for age 62: head normocephalic without obvious abnormality, conjunctiva and cornea clear, trachea midline Pulm: Normal work of breathing, normal phonation Neuro: alert and oriented x 3, no tremor MSK: extremities atraumatic, normal gait and station Skin: intact, no rashes on exposed skin, no jaundice, no cyanosis Psych: well-groomed, cooperative, good eye contact, euthymic mood, affect mood-congruent, speech is articulate, and thought processes clear and goal-directed    No results found for this or any previous visit (from the past 72 hour(s)). No results found.    Assessment and Plan: 57 y.o. female with   .Alice Williams was seen  today for follow-up.  Diagnoses and all orders for this visit:  Gastroesophageal reflux disease with esophagitis -     esomeprazole (NEXIUM) 20 MG capsule; Take 1 capsule (20 mg total) by mouth daily before breakfast.   Plan to complete Nexium 40 mg for full 4 weeks, then reduce to 20 mg QD Cont GERD diet   Patient education and anticipatory guidance given Patient agrees with treatment plan Follow-up as needed if symptoms worsen or fail to improve  Darlyne Russian PA-C

## 2018-09-18 MED FILL — TROKENDI XR 200 MG CAPSULE: 200 | 30 days supply | Qty: 30 | Fill #3

## 2018-09-18 MED FILL — EMGALITY 120 MG/ML SOAJ: 120 | 30 days supply | Qty: 1 | Fill #2

## 2018-10-02 ENCOUNTER — Encounter: Payer: Self-pay | Admitting: Physician Assistant

## 2018-10-20 MED FILL — TROKENDI XR 200 MG CAPSULE: 200 | 30 days supply | Qty: 30 | Fill #0

## 2018-10-20 MED FILL — EMGALITY 120 MG/ML SOAJ: 120 | 30 days supply | Qty: 1 | Fill #3

## 2018-10-24 ENCOUNTER — Other Ambulatory Visit: Payer: Self-pay | Admitting: Physician Assistant

## 2018-10-24 DIAGNOSIS — I471 Supraventricular tachycardia: Secondary | ICD-10-CM

## 2018-10-24 MED FILL — NADOLOL 20 MG TAB: 20 | 90 days supply | Qty: 90 | Fill #0

## 2018-10-30 ENCOUNTER — Other Ambulatory Visit: Payer: Self-pay | Admitting: Osteopathic Medicine

## 2018-10-30 DIAGNOSIS — I4719 Other supraventricular tachycardia: Secondary | ICD-10-CM

## 2018-10-30 DIAGNOSIS — I471 Supraventricular tachycardia: Secondary | ICD-10-CM

## 2018-10-31 ENCOUNTER — Encounter: Payer: Self-pay | Admitting: Physician Assistant

## 2018-11-06 ENCOUNTER — Encounter: Payer: Self-pay | Admitting: Gastroenterology

## 2018-11-07 MED FILL — NADOLOL 20 MG TAB: 20 | 90 days supply | Qty: 90 | Fill #0

## 2018-11-13 ENCOUNTER — Encounter: Payer: Self-pay | Admitting: Gastroenterology

## 2018-11-13 ENCOUNTER — Other Ambulatory Visit (INDEPENDENT_AMBULATORY_CARE_PROVIDER_SITE_OTHER): Payer: 59

## 2018-11-13 ENCOUNTER — Ambulatory Visit: Payer: 59 | Admitting: Gastroenterology

## 2018-11-13 VITALS — BP 98/72 | HR 79 | Ht 68.0 in | Wt 144.4 lb

## 2018-11-13 DIAGNOSIS — R1032 Left lower quadrant pain: Secondary | ICD-10-CM

## 2018-11-13 DIAGNOSIS — R197 Diarrhea, unspecified: Secondary | ICD-10-CM | POA: Diagnosis not present

## 2018-11-13 LAB — C-REACTIVE PROTEIN: CRP: 0.1 mg/dL — ABNORMAL LOW (ref 0.5–20.0)

## 2018-11-13 LAB — SEDIMENTATION RATE: Sed Rate: 2 mm/hr (ref 0–30)

## 2018-11-13 LAB — TSH: TSH: 0.78 u[IU]/mL (ref 0.35–4.50)

## 2018-11-13 MED ORDER — DICYCLOMINE HCL 10 MG PO CAPS: 10.0000 mg | ORAL_CAPSULE | Freq: Two times a day (BID) | ORAL | 3 refills | Status: DC

## 2018-11-13 MED FILL — DICYCLOMINE 10 MG CAPSULE: 10 | 30 days supply | Qty: 60 | Fill #0

## 2018-11-13 NOTE — Patient Instructions (Signed)
If you are age 57 or older, your body mass index should be between 23-30. Your Body mass index is 21.95 kg/m. If this is out of the aforementioned range listed, please consider follow up with your Primary Care Provider.  If you are age 76 or younger, your body mass index should be between 19-25. Your Body mass index is 21.95 kg/m. If this is out of the aformentioned range listed, please consider follow up with your Primary Care Provider.     You have been scheduled for a CT scan of the abdomen and pelvis at [place] are scheduled on 11/19/18 at [place] should arrive 15 minutes prior to your appointment time for registration. Please follow the written instructions below on the day of your exam:  WARNING: IF YOU ARE ALLERGIC TO IODINE/X-RAY DYE, PLEASE NOTIFY RADIOLOGY IMMEDIATELY AT [phone]! YOU WILL BE GIVEN A 13 HOUR PREMEDICATION PREP.  1) Do not eat or drink anything after 5am (4 hours prior to your test) 2) You have been given 2 bottles of oral contrast to drink. The solution may taste better if refrigerated, but do NOT add ice or any other liquid to this solution. Shake well before drinking.    Drink 1 bottle of contrast @ 7am (2 hours prior to your exam)  Drink 1 bottle of contrast @ 8am (1 hour prior to your exam)  You may take any medications as prescribed with a small amount of water, if necessary. If you take any of the following medications: METFORMIN, GLUCOPHAGE, GLUCOVANCE, AVANDAMET, RIOMET, FORTAMET, [place] MET, JANUMET, GLUMETZA or METAGLIP, you MAY be asked to HOLD this medication 48 hours AFTER the exam.  The purpose of you drinking the oral contrast is to aid in the visualization of your intestinal tract. The contrast solution may cause some diarrhea. Depending on your individual set of symptoms, you may also receive an intravenous injection of x-ray contrast/dye. Plan on being at [facility] for 30 minutes or longer, depending on the type  of exam you are having performed.  This test typically takes 30-45 minutes to complete.  If you have any questions regarding your exam or if you need to reschedule, you may call the CT department at [phone] between the hours of 8:00 am and 5:00 pm, Monday-Friday.  ________________________________________________________________________   Please go to the lab on the 2nd floor [address] before you leave the office today.    We have sent the following medications to your pharmacy for you to pick up at your convenience: Bentyl 10 mg twice daily  Continue taking Nexium.  Stop taking Magnesium.   Thank you,  Dr. Jackquline Denmark

## 2018-11-13 NOTE — Progress Notes (Signed)
Chief Complaint: Abdominal pain  Referring Provider:  Ottis Stain*      ASSESSMENT AND PLAN;   #1. LLQ pain with weight loss. #2. Intermittent N/V with H/O GERD. Neg [place] 08/2018. Nl CBC, CMP and lipase 08/2018.  Could be related to prolia and emgality (new meds). #3. FH colon cancer (mom) at age 57 (Stage IV).  H/O adenomatous colonic polyps in the past. Neg EGD/colon 03/2015 at [facility]. #4. IBS with diarrhea , has H/O constipation in past.  Plan: - Check GI pathgen, WBC, fecal elastase. - Proceed with CT abdo/pelvis with p.o. and IV contrast. - Check TSH, CRP, sed rate and celiac screen. - Bentyl 10mg  po bid half an hour before meals and bedtime. - Stop Mg. - Continue nexium 20mg  po qd for now. - FU in 8 weeks. At FU, proceed with EGD and colon for further evaluation. - Consider holding prolia and possibly emgality until GI symptoms are better and WU is complete.   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  Thereafter has several episodes of diarrhea.  This has been going on for several months intermittently, getting worse lately.  Has been having worsening reflux.  Has been having intermittent nausea/vomiting.  Does feel better after she throws up.  Started after she took Prolia and gotten worse after emgality crampy then diarrhea 3-4/day.  The above episodes could take anywhere from 6 to 8 hours.  She has lost 6 pounds in the last month.  Had normal CBC, CMP, lipase previously.  Was recently had an ultrasound of the abdomen complete which was unremarkable. Has been started on Nexium 20 mg p.o. once a day which works better than Prilosec.  He also complains of strange taste and burping.  Previously diagnosed as having IBS-had negative EGD and colonoscopy at [facility].  Denies having any intolerance to gluten/milk or cheese.  She asked to drink milk.  Previously when she was  constipated she would take corn and milk with good bowel movements.  No melena or hematochezia.  Has a stressful job.  Not been sleeping very well.  Also with previous history of endometriosis.     PSH - Works as Dispensing optician Past Medical History:  Diagnosis Date  . Anal fissure   . Anxiety   . Breast calcifications on mammogram   . Colon polyp   . Dyslipidemia 06/24/2018  . Elevated cholesterol   . Facet arthropathy, lumbosacral 08/28/2017  . GERD (gastroesophageal reflux disease)   . History of colon polyps   . IBS (irritable bowel syndrome)   . Inappropriate sinus tachycardia   . Inappropriate sinus tachycardia   . Migraines   . Osteopenia   . Osteoporosis of femur without pathological fracture 08/28/2017  . Palpitations   . Premature surgical menopause     Past Surgical History:  Procedure Laterality Date  . ANAL FISSURE REPAIR    . APPENDECTOMY  2000  . COLONOSCOPY W/ POLYPECTOMY  03/2015  . COLONOSCOPY WITH ESOPHAGOGASTRODUODENOSCOPY (EGD)  04/01/2015  . KNEE SURGERY    . OOPHORECTOMY    . [place]  . PELVIC LAPAROSCOPY     x 4 for endometriosis   . VAGINAL HYSTERECTOMY  1994    Family History  Problem Relation Age of Onset  . Colon cancer Mother   . Breast cancer Mother 57  and liver  . Hyperlipidemia Father   . Alcohol abuse Father   . Heart attack Father   . Cancer Father        Head and neck   . Hyperlipidemia Brother   . Lung cancer Maternal Grandmother   . Parkinson's disease Maternal Grandfather   . Dementia Maternal Grandfather   . Prostate cancer Maternal Grandfather   . Parkinson's disease Paternal Grandmother   . Heart attack Paternal Grandfather   . Kidney cancer Sister 6  . Esophageal cancer Neg Hx     Social History   Tobacco Use  . Smoking status: Former Smoker    Last attempt to quit: 08/09/2006    Years since quitting: 12.2  . Smokeless tobacco: Never Used  Substance Use Topics  . Alcohol use: Not  Currently  . Drug use: No    Current Outpatient Medications  Medication Sig Dispense Refill  . baclofen (LIORESAL) 10 MG tablet Take 1 tablet by mouth 3 (three) times daily as needed.  2  . BOTOX 200 units SOLR   3  . Calcium Carbonate-Vitamin D 600-400 MG-UNIT tablet Take 1 tablet by mouth 2 (two) times daily. 60 tablet 11  . Coenzyme Q10 (CO Q 10 PO) Take by mouth.    . Cyanocobalamin (B-12 PO) Take by mouth.    . denosumab (PROLIA) 60 MG/ML SOSY injection Inject 60 mg into the skin every 6 (six) months.    Marland Kitchen EMGALITY 120 MG/ML SOAJ   11  . esomeprazole (NEXIUM) 20 MG capsule Take 1 capsule (20 mg total) by mouth daily before breakfast. 90 capsule 1  . eszopiclone (LUNESTA) 1 MG TABS tablet Take 1-2 tablets (1-2 mg total) by mouth at bedtime as needed for sleep. Take immediately before bedtime 30 tablet 2  . MAGNESIUM PO Take by mouth.    . nadolol (CORGARD) 20 MG tablet TAKE 1 TABLET BY MOUTH DAILY. DUE FOR FOLLOW UP WITH PCP 90 tablet 0  . Riboflavin 400 MG CAPS Take 1 capsule by mouth daily.    Marland Kitchen TROKENDI XR 200 MG CP24 Take 200 mg by mouth at bedtime.  3   No current facility-administered medications for this visit.     Allergies  Allergen Reactions  . Sulfa Antibiotics Anaphylaxis  . Sumatriptan Succinate Anaphylaxis    Review of Systems:  Constitutional: Denies fever, chills, diaphoresis, decreased appetite change, has fatigue.  HEENT: Denies photophobia, eye pain, redness, hearing loss, ear pain, congestion, sore throat, rhinorrhea, sneezing, mouth sores, neck pain, neck stiffness and tinnitus.   Respiratory: Denies SOB, DOE, cough, chest tightness,  and wheezing.   Cardiovascular: Denies chest pain, palpitations and leg swelling.  Genitourinary: Denies dysuria, urgency, frequency, hematuria, flank pain and difficulty urinating.  Musculoskeletal: Denies myalgias, joint swelling, arthralgias and gait problem.  Skin: No rash.  Neurological: Denies dizziness, seizures,  syncope, weakness, light-headedness, numbness and has headaches/migraine.  Hematological: Denies adenopathy. Easy bruising, personal or family bleeding history  Psychiatric/Behavioral: Has anxiety and sleep problems.     Physical Exam:    BP 98/72   Pulse 79   Ht 5\' 8"  (1.727 m)   Wt 144 lb 6 oz (65.5 kg)   BMI 21.95 kg/m  Filed Weights   11/13/18 0955  Weight: 144 lb 6 oz (65.5 kg)   Constitutional:  Well-developed, in no acute distress. Psychiatric: Normal mood and affect. Behavior is normal. HEENT: Pupils normal.  Conjunctivae are normal. No scleral icterus. Neck supple.  Cardiovascular: Normal rate, regular  rhythm. No edema Pulmonary/chest: Effort normal and breath sounds normal. No wheezing, rales or rhonchi. Abdominal: Soft, nondistended.  Mild left lower quadrant abdominal tenderness, no rebound. Bowel sounds active throughout. There are no masses palpable. No hepatomegaly. Rectal:  defered Neurological: Alert and oriented to person place and time. Skin: Skin is warm and dry. No rashes noted.  Data Reviewed: I have personally reviewed following labs and imaging studies  CBC: CBC Latest Ref Rng & Units 08/29/2018 06/23/2018  WBC 3.8 - 10.8 Thousand/uL 10.6 7.0  Hemoglobin 11.7 - 15.5 g/dL 13.7 13.3  Hematocrit 35.0 - 45.0 % 39.9 39.8  Platelets 140 - 400 Thousand/uL 252 220    CMP: CMP Latest Ref Rng & Units 08/29/2018 06/23/2018 09/03/2017  Glucose 65 - 99 mg/dL 83 91 80  BUN 7 - 25 mg/dL 17 18 20   Creatinine 0.50 - 1.05 mg/dL 0.92 0.97 0.94  Sodium 135 - 146 mmol/L 142 142 145  Potassium 3.5 - 5.3 mmol/L 3.8 4.1 3.9  Chloride 98 - 110 mmol/L 113(H) 112(H) 114(H)  CO2 20 - 32 mmol/L 22 26 23   Calcium 8.6 - 10.4 mg/dL 8.9 9.4 9.1  Total Protein 6.1 - 8.1 g/dL 6.2 6.4 -  Total Bilirubin 0.2 - 1.2 mg/dL 0.5 0.4 -  AST 10 - 35 U/L 17 15 -  ALT 6 - 29 U/L 13 13 -     Carmell Austria, MD 11/13/2018, 10:18 AM  Cc: Ottis Stain*

## 2018-11-15 LAB — CELIAC PANEL 10
Antigliadin Abs, IgA: 4 units (ref 0–19)
ENDOMYSIAL IGA: NEGATIVE
Gliadin IgG: 3 units (ref 0–19)
IgA/Immunoglobulin A, Serum: 245 mg/dL (ref 87–352)
TISSUE TRANSGLUT AB: 10 U/mL — AB (ref 0–5)
Transglutaminase IgA: <2 U/mL (ref 0–3)

## 2018-11-17 ENCOUNTER — Telehealth: Payer: Self-pay | Admitting: Gastroenterology

## 2018-11-17 NOTE — Telephone Encounter (Signed)
Please see additional documentation concerning this notification;

## 2018-11-17 NOTE — Telephone Encounter (Signed)
Pt called to enquire about lab results.

## 2018-11-19 ENCOUNTER — Encounter (HOSPITAL_BASED_OUTPATIENT_CLINIC_OR_DEPARTMENT_OTHER): Payer: Self-pay

## 2018-11-19 ENCOUNTER — Ambulatory Visit (HOSPITAL_BASED_OUTPATIENT_CLINIC_OR_DEPARTMENT_OTHER)
Admission: RE | Admit: 2018-11-19 | Discharge: 2018-11-19 | Disposition: A | Discharge status: Home / Self Care | Payer: 59 | Source: Ambulatory Visit | Attending: Gastroenterology | Admitting: Gastroenterology

## 2018-11-19 ENCOUNTER — Other Ambulatory Visit: Payer: 59

## 2018-11-19 DIAGNOSIS — R1032 Left lower quadrant pain: Secondary | ICD-10-CM | POA: Diagnosis not present

## 2018-11-19 DIAGNOSIS — R197 Diarrhea, unspecified: Secondary | ICD-10-CM | POA: Diagnosis not present

## 2018-11-19 DIAGNOSIS — R1110 Vomiting, unspecified: Secondary | ICD-10-CM | POA: Diagnosis not present

## 2018-11-19 MED ORDER — IOPAMIDOL (ISOVUE-300) INJECTION 61%
100.0000 mL | Freq: Once | INTRAVENOUS | Status: AC | PRN
  Administered 2018-11-19: 100 mL via INTRAVENOUS

## 2018-11-27 LAB — FECAL LACTOFERRIN, QUANT
Fecal Lactoferrin: NEGATIVE
MICRO NUMBER:: 89093
SPECIMEN QUALITY: ADEQUATE

## 2018-11-27 LAB — GASTROINTESTINAL PATHOGEN PANEL PCR
C. difficile Tox A/B, PCR: NOT DETECTED
CAMPYLOBACTER, PCR: NOT DETECTED
CRYPTOSPORIDIUM, PCR: NOT DETECTED
E COLI 0157, PCR: NOT DETECTED
E coli (ETEC) LT/ST PCR: NOT DETECTED
E coli (STEC) stx1/stx2, PCR: NOT DETECTED
GIARDIA LAMBLIA, PCR: NOT DETECTED
NOROVIRUS, PCR: NOT DETECTED
Rotavirus A, PCR: NOT DETECTED
SHIGELLA, PCR: NOT DETECTED
Salmonella, PCR: NOT DETECTED

## 2018-11-27 LAB — PANCREATIC ELASTASE, FECAL: Pancreatic Elastase-1, Stool: 456 mcg/g

## 2018-12-08 MED FILL — TROKENDI XR 200 MG CAPSULE: 200 | 30 days supply | Qty: 30 | Fill #1

## 2018-12-10 ENCOUNTER — Telehealth: Payer: Self-pay | Admitting: Physician Assistant

## 2018-12-10 MED FILL — ESZOPICLONE 1 MG TABLET: 1 | 30 days supply | Qty: 30 | Fill #1

## 2018-12-10 NOTE — Telephone Encounter (Signed)
Received fax from [employer] that Lunesta requires a PA. Information has been sent to the insurance company. Awaiting determination.

## 2018-12-11 NOTE — Telephone Encounter (Signed)
Received a fax that insurance will only allow 1 tablets of the 1 mg Lunesta daily and not 1-2 tablets as needed. The recommendation is to go to the 2 mg dose or just send as 1 tablet of the 1 mg. This is a plan quantity limit. Please advise.

## 2018-12-14 NOTE — Telephone Encounter (Signed)
Please contact patient and ask her which dose she is using

## 2018-12-15 NOTE — Telephone Encounter (Signed)
Per insurance she can only take once daily. MyChart message sent.

## 2018-12-24 ENCOUNTER — Ambulatory Visit: Payer: 59 | Admitting: Physician Assistant

## 2018-12-30 DIAGNOSIS — G43709 Chronic migraine without aura, not intractable, without status migrainosus: Secondary | ICD-10-CM | POA: Diagnosis not present

## 2019-01-02 ENCOUNTER — Encounter: Payer: Self-pay | Admitting: Gastroenterology

## 2019-01-02 ENCOUNTER — Ambulatory Visit: Payer: 59 | Admitting: Gastroenterology

## 2019-01-02 VITALS — BP 106/68 | HR 72 | Ht 68.0 in | Wt 145.0 lb

## 2019-01-02 DIAGNOSIS — R634 Abnormal weight loss: Secondary | ICD-10-CM | POA: Diagnosis not present

## 2019-01-02 DIAGNOSIS — Z800 Family history of malignant neoplasm of digestive organs: Secondary | ICD-10-CM

## 2019-01-02 DIAGNOSIS — K580 Irritable bowel syndrome with diarrhea: Secondary | ICD-10-CM | POA: Diagnosis not present

## 2019-01-02 DIAGNOSIS — R1032 Left lower quadrant pain: Secondary | ICD-10-CM

## 2019-01-02 MED ORDER — SOD PICOSULFATE-MAG OX-CIT ACD 10-3.5-12 MG-GM -GM/160ML PO SOLN: 1.0000 | Freq: Once | ORAL | 0 refills | Status: AC

## 2019-01-02 MED FILL — DICYCLOMINE 10 MG CAPSULE: 10 | 30 days supply | Qty: 60 | Fill #1

## 2019-01-02 MED FILL — CLENPIQ 10-3.5-12 MG-GM -GM: 10-3.5-12 M | 1 days supply | Qty: 320 | Fill #0

## 2019-01-02 NOTE — Patient Instructions (Signed)
If you are age 57 or older, your body mass index should be between 23-30. Your Body mass index is 22.05 kg/m. If this is out of the aforementioned range listed, please consider follow up with your Primary Care Provider.  If you are age 78 or younger, your body mass index should be between 19-25. Your Body mass index is 22.05 kg/m. If this is out of the aformentioned range listed, please consider follow up with your Primary Care Provider.   You have been scheduled for an endoscopy. Please follow written instructions given to you at your visit today. If you use inhalers (even only as needed), please bring them with you on the day of your procedure. Your physician has requested that you go to www.[email or web address].[email or web address] and enter the access code given to you at your visit today. This web site gives a general overview about your procedure. However, you should still follow specific instructions given to you by our office regarding your preparation for the procedure.   Thank you,  Dr. Jackquline Denmark

## 2019-01-02 NOTE — Progress Notes (Signed)
Chief Complaint: FU Abdominal pain  Referring Provider:  Ottis Stain*      ASSESSMENT AND PLAN;   #1. GERD with small HH (on CT). Neg [place] 08/2018. Nl CBC, CMP and lipase 08/2018.  Could be related to prolia and emgality (new meds). #2. FH colon cancer (mom) at age 57 (Stage IV).  H/O adenomatous colonic polyps in the past. Neg EGD/colon 03/2015 at [facility]. #3. IBS with diarrhea (H/O constipation in past). Neg stool studies, CT abdo/pelvis and labs including 10/2018 TSH, CRP, sed rate and celiac screen. #4. CT abdo/pelvis 10/2018-negative except for small 9 mm omental nodule.  Radiology recommend rpt CT in 3 months.  Plan: - Bentyl 10mg  po bid half an hour before meals and bedtime to continue. - Stop Mg as she has done already. - Continue nexium 20mg  po qd for now. - EGD with SB Bx and colon with clenpiq for further evaluation. D/W risks and benefits. - FU in April 2020.  At FU, repeat CT scan abdo/pelvis for FU of omental nodule as suggested by radiology.   HPI:    Alice Williams is a 58 y.o. female  RN at [employer]  Doing very well.  The abdominal pain has more or less resolved.  No further weight loss.  Tolerating Bentyl well.  Sleeping much better.  She takes Lunesta 3 times per week.  Occasional diarrhea but significantly better.  Occasional constipation as well.  Would like to get EGD and colonoscopy done.  Does not want any Prolia/ emgality   Had normal CBC, CMP, lipase previously.  Had normal C-reactive protein, sed rate and TSH.  Celiac screen was negative except for TTG IgG which was borderline positive.  CT scan Abdo/pelvis was unremarkable.   Previously diagnosed as having IBS-had negative EGD and colonoscopy at [facility].  Denies having any intolerance to gluten/milk or cheese.  She asked to drink milk.  Previously when she was constipated she would take corn and milk with good bowel movements.  No melena or hematochezia.  Has a stressful job.  Not been  sleeping very well.  Also with previous history of endometriosis.     PSH - Works as Dispensing optician Past Medical History:  Diagnosis Date  . Anal fissure   . Anxiety   . Breast calcifications on mammogram   . Colon polyp   . Dyslipidemia 06/24/2018  . Elevated cholesterol   . Facet arthropathy, lumbosacral 08/28/2017  . GERD (gastroesophageal reflux disease)   . History of colon polyps   . IBS (irritable bowel syndrome)   . Inappropriate sinus tachycardia   . Inappropriate sinus tachycardia   . Migraines   . Osteopenia   . Osteoporosis of femur without pathological fracture 08/28/2017  . Palpitations   . Premature surgical menopause     Past Surgical History:  Procedure Laterality Date  . ANAL FISSURE REPAIR    . APPENDECTOMY  2000  . COLONOSCOPY W/ POLYPECTOMY  03/2015  . COLONOSCOPY WITH ESOPHAGOGASTRODUODENOSCOPY (EGD)  04/01/2015  . KNEE SURGERY    . OOPHORECTOMY    . [place]  . PELVIC LAPAROSCOPY     x 4 for endometriosis   . VAGINAL HYSTERECTOMY  1994    Family History  Problem Relation Age of Onset  . Colon cancer Mother   . Breast cancer Mother 33       and liver  . Hyperlipidemia Father   . Alcohol abuse Father   .  Heart attack Father   . Cancer Father        Head and neck   . Hyperlipidemia Brother   . Lung cancer Maternal Grandmother   . Parkinson's disease Maternal Grandfather   . Dementia Maternal Grandfather   . Prostate cancer Maternal Grandfather   . Parkinson's disease Paternal Grandmother   . Heart attack Paternal Grandfather   . Kidney cancer Sister 74  . Esophageal cancer Neg Hx     Social History   Tobacco Use  . Smoking status: Former Smoker    Last attempt to quit: 08/09/2006    Years since quitting: 12.4  . Smokeless tobacco: Never Used  Substance Use Topics  . Alcohol use: Not Currently  . Drug use: No    Current Outpatient Medications  Medication Sig Dispense Refill  . baclofen (LIORESAL) 10 MG tablet  Take 1 tablet by mouth 3 (three) times daily as needed.  2  . BOTOX 200 units SOLR   3  . Calcium Carbonate-Vitamin D 600-400 MG-UNIT tablet Take 1 tablet by mouth 2 (two) times daily. 60 tablet 11  . Coenzyme Q10 (CO Q 10 PO) Take by mouth.    . Cyanocobalamin (B-12 PO) Take by mouth.    . dicyclomine (BENTYL) 10 MG capsule Take 1 capsule (10 mg total) by mouth 2 (two) times daily. 60 capsule 3  . esomeprazole (NEXIUM) 20 MG capsule Take 1 capsule (20 mg total) by mouth daily before breakfast. 90 capsule 1  . eszopiclone (LUNESTA) 1 MG TABS tablet Take 1-2 tablets (1-2 mg total) by mouth at bedtime as needed for sleep. Take immediately before bedtime 30 tablet 2  . MAGNESIUM PO Take by mouth.    . nadolol (CORGARD) 20 MG tablet TAKE 1 TABLET BY MOUTH DAILY. DUE FOR FOLLOW UP WITH PCP 90 tablet 0  . Riboflavin 400 MG CAPS Take 1 capsule by mouth daily.    Marland Kitchen TROKENDI XR 200 MG CP24 Take 200 mg by mouth at bedtime.  3   No current facility-administered medications for this visit.     Allergies  Allergen Reactions  . Sulfa Antibiotics Anaphylaxis  . Sumatriptan Succinate Anaphylaxis    Review of Systems:  Neg  Has anxiety and sleep problems- better now.     Physical Exam:    BP 106/68   Pulse 72   Ht 5\' 8"  (1.727 m)   Wt 145 lb (65.8 kg)   BMI 22.05 kg/m  Filed Weights   01/02/19 1338  Weight: 145 lb (65.8 kg)   Constitutional:  Well-developed, in no acute distress. Psychiatric: Normal mood and affect. Behavior is normal. HEENT: Pupils normal.  Conjunctivae are normal. No scleral icterus. Neck supple.  Cardiovascular: Normal rate, regular rhythm. No edema Pulmonary/chest: Effort normal and breath sounds normal. No wheezing, rales or rhonchi. Abdominal: Soft, nondistended.  Mild left lower quadrant abdominal tenderness, no rebound. Bowel sounds active throughout. There are no masses palpable. No hepatomegaly. Rectal:  defered Neurological: Alert and oriented to person  place and time. Skin: Skin is warm and dry. No rashes noted.  Data Reviewed: I have personally reviewed following labs and imaging studies  CBC: CBC Latest Ref Rng & Units 08/29/2018 06/23/2018  WBC 3.8 - 10.8 Thousand/uL 10.6 7.0  Hemoglobin 11.7 - 15.5 g/dL 13.7 13.3  Hematocrit 35.0 - 45.0 % 39.9 39.8  Platelets 140 - 400 Thousand/uL 252 220    CMP: CMP Latest Ref Rng & Units 08/29/2018 06/23/2018 09/03/2017  Glucose 65 -  99 mg/dL 83 91 80  BUN 7 - 25 mg/dL 17 18 20   Creatinine 0.50 - 1.05 mg/dL 0.92 0.97 0.94  Sodium 135 - 146 mmol/L 142 142 145  Potassium 3.5 - 5.3 mmol/L 3.8 4.1 3.9  Chloride 98 - 110 mmol/L 113(H) 112(H) 114(H)  CO2 20 - 32 mmol/L 22 26 23   Calcium 8.6 - 10.4 mg/dL 8.9 9.4 9.1  Total Protein 6.1 - 8.1 g/dL 6.2 6.4 -  Total Bilirubin 0.2 - 1.2 mg/dL 0.5 0.4 -  AST 10 - 35 U/L 17 15 -  ALT 6 - 29 U/L 13 13 -   CT 11/19/2018 IMPRESSION: 1. No acute findings in the abdomen or pelvis. Specifically, no findings to explain the patient's history of left-sided abdominal pain. 2. 9 mm omental nodule identified lower left abdomen. This does not appear to be a diverticulum. Follow-up CT in 3 months recommended to ensure stability. 3. Tiny hiatal hernia.    Carmell Austria, MD 01/02/2019, 1:45 PM  Cc: Ottis Stain*

## 2019-01-06 MED FILL — TROKENDI XR 200 MG CAPSULE: 200 | 30 days supply | Qty: 30 | Fill #2

## 2019-01-06 MED FILL — ESOMEPRAZOLE MAGNESIUM 20 M: 20 | 90 days supply | Qty: 90 | Fill #1

## 2019-01-28 ENCOUNTER — Encounter: Payer: Self-pay | Admitting: Gastroenterology

## 2019-02-05 ENCOUNTER — Telehealth: Payer: Self-pay | Admitting: *Deleted

## 2019-02-05 MED FILL — BOTOX 200 UNITS VIAL: 200 | 1 days supply | Qty: 1 | Fill #0

## 2019-02-05 NOTE — Telephone Encounter (Signed)
Lets get it done Thanks for letting me know

## 2019-02-05 NOTE — Telephone Encounter (Signed)
LM on vm for pt to call back to rsc 4/16 appointment to another day next week or the following week.

## 2019-02-05 NOTE — Telephone Encounter (Signed)
Pt returned my call.  She states that she is continuing to have the same problems as before.  She would like to have procedure done if possible.  Also, she is scheduled to start working at the [place] location ( with covid 19 pts) on Thursday 4/16 of next week.  She is afraid that after that point of being exposed she would not be able to have procedure done.  I went ahead a rescheduled her for Tuesday 4/14 with Dr. Rush Landmark.  Dr. Lyndel Safe please advise if this sounds reasonable.

## 2019-02-09 ENCOUNTER — Telehealth: Payer: Self-pay | Admitting: *Deleted

## 2019-02-09 MED FILL — TROKENDI XR 200 MG CAPSULE: 200 | 30 days supply | Qty: 30 | Fill #3

## 2019-02-09 NOTE — Telephone Encounter (Signed)
Covid-19 travel screening questions  Have you traveled in the last 14 days? No If yes where?  Do you now or have you had a fever in the last 14 days? No  Do you have any respiratory symptoms of shortness of breath or cough now or in the last 14 days? No  Do you have any family members or close contacts with diagnosed or suspected Covid-19? No       

## 2019-02-10 ENCOUNTER — Encounter: Payer: Self-pay | Admitting: Gastroenterology

## 2019-02-10 ENCOUNTER — Ambulatory Visit (AMBULATORY_SURGERY_CENTER): Payer: 59 | Admitting: Gastroenterology

## 2019-02-10 ENCOUNTER — Other Ambulatory Visit: Payer: Self-pay

## 2019-02-10 VITALS — BP 119/69 | HR 56 | Temp 98.4°F | Resp 20 | Ht 68.0 in | Wt 145.0 lb

## 2019-02-10 DIAGNOSIS — K635 Polyp of colon: Secondary | ICD-10-CM

## 2019-02-10 DIAGNOSIS — D127 Benign neoplasm of rectosigmoid junction: Secondary | ICD-10-CM | POA: Diagnosis not present

## 2019-02-10 DIAGNOSIS — K2970 Gastritis, unspecified, without bleeding: Secondary | ICD-10-CM

## 2019-02-10 DIAGNOSIS — Z800 Family history of malignant neoplasm of digestive organs: Secondary | ICD-10-CM

## 2019-02-10 DIAGNOSIS — D128 Benign neoplasm of rectum: Secondary | ICD-10-CM

## 2019-02-10 DIAGNOSIS — K449 Diaphragmatic hernia without obstruction or gangrene: Secondary | ICD-10-CM | POA: Diagnosis not present

## 2019-02-10 DIAGNOSIS — K621 Rectal polyp: Secondary | ICD-10-CM | POA: Diagnosis not present

## 2019-02-10 DIAGNOSIS — K31819 Angiodysplasia of stomach and duodenum without bleeding: Secondary | ICD-10-CM

## 2019-02-10 DIAGNOSIS — R1032 Left lower quadrant pain: Secondary | ICD-10-CM | POA: Diagnosis not present

## 2019-02-10 DIAGNOSIS — K317 Polyp of stomach and duodenum: Secondary | ICD-10-CM | POA: Diagnosis not present

## 2019-02-10 DIAGNOSIS — K2950 Unspecified chronic gastritis without bleeding: Secondary | ICD-10-CM | POA: Diagnosis not present

## 2019-02-10 MED ORDER — SODIUM CHLORIDE 0.9 % IV SOLN: 500.0000 mL | Freq: Once | INTRAVENOUS | Status: DC

## 2019-02-10 NOTE — Progress Notes (Signed)
Pt's states no medical or surgical changes since previsit or office visit. 

## 2019-02-10 NOTE — Op Note (Addendum)
[facility] Patient Name: Alice Williams Procedure Date: 02/10/2019 7:54 AM MRN: [id number] Endoscopist: Justice Britain , MD Age: 58 Referring MD:  Date of Birth: Mar 13, 1961 Gender: Female Account #: [id number] Procedure:                Colonoscopy Indications:              Abdominal pain in the left lower quadrant, Chronic                            diarrhea Medicines:                Monitored Anesthesia Care Procedure:                Pre-Anesthesia Assessment:                           - Prior to the procedure, a History and Physical                            was performed, and patient medications and                            allergies were reviewed. The patient's tolerance of                            previous anesthesia was also reviewed. The risks                            and benefits of the procedure and the sedation                            options and risks were discussed with the patient.                            All questions were answered, and informed consent                            was obtained. Prior Anticoagulants: The patient has                            taken no previous anticoagulant or antiplatelet                            agents. ASA Grade Assessment: II - A patient with                            mild systemic disease. After reviewing the risks                            and benefits, the patient was deemed in                            satisfactory condition to undergo the procedure.  After obtaining informed consent, the colonoscope                            was passed under direct vision. Throughout the                            procedure, the patient's blood pressure, pulse, and                            oxygen saturations were monitored continuously. The                            Colonoscope was introduced through the anus and                            advanced to the 6 cm into the ileum. The                             colonoscopy was performed without difficulty. The                            patient tolerated the procedure. The quality of the                            bowel preparation was excellent. The terminal                            ileum, ileocecal valve, appendiceal orifice, and                            rectum were photographed. Scope In: 8:14:44 AM Scope Out: 8:34:00 AM Scope Withdrawal Time: 0 hours 15 minutes 27 seconds  Total Procedure Duration: 0 hours 19 minutes 16 seconds  Findings:                 The digital rectal exam findings include                            non-thrombosed external hemorrhoids. Pertinent                            negatives include no palpable rectal lesions.                           The terminal ileum and ileocecal valve appeared                            normal. Biopsies were taken with a cold forceps for                            histology to rule out Crohn's & Enteropathy.                           Four sessile polyps were found in the rectum (2)  and recto-sigmoid colon (2). The polyps were 1 to 2                            mm in size. These polyps were removed with a cold                            biopsy forceps. Resection and retrieval were                            complete.                           Normal mucosa was found in the entire colon                            otherwise. Biopsies were taken with a cold forceps                            for histology throughout the colon to rule out                            microscopic colitis/IBD.                           Non-bleeding non-thrombosed external and internal                            hemorrhoids were found during retroflexion, during                            perianal exam and during digital exam. The                            hemorrhoids were Grade I (internal hemorrhoids that                            do not prolapse). Complications:             No immediate complications. Estimated Blood Loss:     Estimated blood loss was minimal. Impression:               - Non-thrombosed external hemorrhoids found on                            digital rectal exam.                           - The examined portion of the ileum was normal.                            Biopsied.                           - Four 1 to 2 mm polyps in the rectum and at the  recto-sigmoid colon, removed with a cold biopsy                            forceps. Resected and retrieved.                           - Normal mucosa in the entire examined colon                            otherwise. Biopsied.                           - Non-bleeding non-thrombosed external and internal                            hemorrhoids. Recommendation:           - The patient will be observed post-procedure,                            until all discharge criteria are met.                           - Discharge patient to home.                           - Patient has a contact number available for                            emergencies. The signs and symptoms of potential                            delayed complications were discussed with the                            patient. Return to normal activities tomorrow.                            Written discharge instructions were provided to the                            patient.                           - Use FiberCon 1 tablet PO daily to try and bulk                            stools.                           - Continue present medications.                           - Await pathology results.                           - Repeat colonoscopy in 3-5 years for surveillance  based on pathology results and findings of                            adenomatous tissue but at least 5 due to family                            history of colon cancer.                           - Return to GI  clinic as previously scheduled.                           - The findings and recommendations were discussed                            with the patient. Justice Britain, MD 02/10/2019 8:53:29 AM

## 2019-02-10 NOTE — Progress Notes (Signed)
PT taken to PACU. Monitors in place. VSS. Report given to RN. 

## 2019-02-10 NOTE — Op Note (Signed)
[facility] Patient Name: Alice Williams Procedure Date: 02/10/2019 7:54 AM MRN: [id number] Endoscopist: Justice Britain , MD Age: 58 Referring MD:  Date of Birth: Apr 25, 1961 Gender: Female Account #: [id number] Procedure:                Upper GI endoscopy Indications:              Abdominal pain in the left lower quadrant, Diarrhea Medicines:                Monitored Anesthesia Care Procedure:                After obtaining informed consent, the endoscope was                            passed under direct vision. Throughout the                            procedure, the patient's blood pressure, pulse, and                            oxygen saturations were monitored continuously. The                            Endoscope was introduced through the mouth, and                            advanced to the second part of duodenum. The upper                            GI endoscopy was accomplished without difficulty.                            The patient tolerated the procedure. Scope In: Scope Out: Findings:                 No gross lesions were noted in the entire esophagus.                           A small hiatal hernia was found. The proximal                            extent of the gastric folds (end of tubular                            esophagus) was 36 cm from the incisors. The hiatal                            narrowing was 39 cm from the incisors. The Z-line                            was 36 cm from the incisors.                           A single 2 mm sessile polyp was found in the  gastric body - typical appearance of fundic gland                            polyp. The polyp was removed with a cold biopsy                            forceps. Resection and retrieval were complete.                           A single diminutive angioectasia with no bleeding                            was found on the greater curvature of the stomach.                No other gross lesions were noted in the entire                            examined stomach. Biopsies were taken with a cold                            forceps for histology and Helicobacter pylori                            testing.                           No gross lesions were noted in the duodenal bulb,                            in the first portion of the duodenum and in the                            second portion of the duodenum. Biopsies for                            histology were taken with a cold forceps for                            evaluation of celiac disease and enteropathy. Complications:            No immediate complications. Estimated Blood Loss:     Estimated blood loss was minimal. Impression:               - No gross lesions in esophagus. Small hiatal                            hernia.                           - A single gastric polyp - likely fundic gland.                            Resected and retrieved.                           -  A single non-bleeding angioectasia in the stomach.                           - No other gross lesions in the stomach. Biopsied                            for HP.                           - No gross lesions in the duodenal bulb, in the                            first portion of the duodenum and in the second                            portion of the duodenum. Biopsied. Recommendation:           - Proceed to scheduled colonoscopy.                           - Await pathology results.                           - The findings and recommendations were discussed                            with the patient. Justice Britain, MD 02/10/2019 8:44:30 AM

## 2019-02-10 NOTE — Patient Instructions (Signed)
Handout given on polyps.   YOU HAD AN ENDOSCOPIC PROCEDURE TODAY AT [facility]:   Refer to the procedure report that was given to you for any specific questions about what was found during the examination.  If the procedure report does not answer your questions, please call your gastroenterologist to clarify.  If you requested that your care partner not be given the details of your procedure findings, then the procedure report has been included in a sealed envelope for you to review at your convenience later.  YOU SHOULD EXPECT: Some feelings of bloating in the abdomen. Passage of more gas than usual.  Walking can help get rid of the air that was put into your GI tract during the procedure and reduce the bloating. If you had a lower endoscopy (such as a colonoscopy or flexible sigmoidoscopy) you may notice spotting of blood in your stool or on the toilet paper. If you underwent a bowel prep for your procedure, you may not have a normal bowel movement for a few days.  Please Note:  You might notice some irritation and congestion in your nose or some drainage.  This is from the oxygen used during your procedure.  There is no need for concern and it should clear up in a day or so.  SYMPTOMS TO REPORT IMMEDIATELY:   Following lower endoscopy (colonoscopy or flexible sigmoidoscopy):  Excessive amounts of blood in the stool  Significant tenderness or worsening of abdominal pains  Swelling of the abdomen that is new, acute  Fever of 100F or higher   Following upper endoscopy (EGD)  Vomiting of blood or coffee ground material  New chest pain or pain under the shoulder blades  Painful or persistently difficult swallowing  New shortness of breath  Fever of 100F or higher  Black, tarry-looking stools  For urgent or emergent issues, a gastroenterologist can be reached at any hour by calling [phone].   DIET:  We do recommend a small meal at first, but then you may proceed  to your regular diet.  Drink plenty of fluids but you should avoid alcoholic beverages for 24 hours.  ACTIVITY:  You should plan to take it easy for the rest of today and you should NOT DRIVE or use heavy machinery until tomorrow (because of the sedation medicines used during the test).    FOLLOW UP: Our staff will call the number listed on your records the next business day following your procedure to check on you and address any questions or concerns that you may have regarding the information given to you following your procedure. If we do not reach you, we will leave a message.  However, if you are feeling well and you are not experiencing any problems, there is no need to return our call.  We will assume that you have returned to your regular daily activities without incident.  If any biopsies were taken you will be contacted by phone or by letter within the next 1-3 weeks.  Please call us at [phone] if you have not heard about the biopsies in 3 weeks.    SIGNATURES/CONFIDENTIALITY: You and/or your care partner have signed paperwork which will be entered into your electronic medical record.  These signatures attest to the fact that that the information above on your After Visit Summary has been reviewed and is understood.  Full responsibility of the confidentiality of this discharge information lies with you and/or your care-partner.

## 2019-02-10 NOTE — Progress Notes (Signed)
Called to room to assist during endoscopic procedure.  Patient ID and intended procedure confirmed with present staff. Received instructions for my participation in the procedure from the performing physician.  

## 2019-02-11 ENCOUNTER — Telehealth: Payer: Self-pay | Admitting: *Deleted

## 2019-02-11 ENCOUNTER — Encounter: Payer: 59 | Admitting: Gastroenterology

## 2019-02-11 ENCOUNTER — Encounter: Payer: Self-pay | Admitting: Physician Assistant

## 2019-02-11 ENCOUNTER — Ambulatory Visit (INDEPENDENT_AMBULATORY_CARE_PROVIDER_SITE_OTHER): Payer: 59 | Admitting: Physician Assistant

## 2019-02-11 VITALS — BP 98/60 | HR 67 | Temp 98.4°F | Wt 144.0 lb

## 2019-02-11 DIAGNOSIS — K640 First degree hemorrhoids: Secondary | ICD-10-CM | POA: Diagnosis not present

## 2019-02-11 DIAGNOSIS — F5105 Insomnia due to other mental disorder: Secondary | ICD-10-CM | POA: Diagnosis not present

## 2019-02-11 DIAGNOSIS — G43709 Chronic migraine without aura, not intractable, without status migrainosus: Secondary | ICD-10-CM | POA: Diagnosis not present

## 2019-02-11 DIAGNOSIS — K635 Polyp of colon: Secondary | ICD-10-CM

## 2019-02-11 DIAGNOSIS — K317 Polyp of stomach and duodenum: Secondary | ICD-10-CM

## 2019-02-11 DIAGNOSIS — R5382 Chronic fatigue, unspecified: Secondary | ICD-10-CM

## 2019-02-11 DIAGNOSIS — K219 Gastro-esophageal reflux disease without esophagitis: Secondary | ICD-10-CM

## 2019-02-11 DIAGNOSIS — F419 Anxiety disorder, unspecified: Secondary | ICD-10-CM | POA: Diagnosis not present

## 2019-02-11 DIAGNOSIS — I471 Supraventricular tachycardia: Secondary | ICD-10-CM

## 2019-02-11 DIAGNOSIS — K449 Diaphragmatic hernia without obstruction or gangrene: Secondary | ICD-10-CM

## 2019-02-11 DIAGNOSIS — Z79899 Other long term (current) drug therapy: Secondary | ICD-10-CM

## 2019-02-11 MED ORDER — NADOLOL 20 MG PO TABS: 20.0000 mg | ORAL_TABLET | Freq: Every day | ORAL | 1 refills | Status: DC

## 2019-02-11 MED ORDER — ESZOPICLONE 2 MG PO TABS: 2.0000 mg | ORAL_TABLET | Freq: Every evening | ORAL | 0 refills | Status: DC | PRN

## 2019-02-11 MED FILL — NADOLOL 20 MG TAB: 20 | 90 days supply | Qty: 90 | Fill #0

## 2019-02-11 MED FILL — ESZOPICLONE 2 MG TAB: 2 | 45 days supply | Qty: 45 | Fill #0

## 2019-02-11 NOTE — Telephone Encounter (Signed)
  Follow up Call-  Call back number 02/10/2019  Post procedure Call Back phone  # [phone]  Permission to leave phone message Yes  Some recent data might be hidden     Patient questions:  Do you have a fever, pain , or abdominal swelling? no Pain Score  0  Have you tolerated food without any problems? yes  Have you been able to return to your normal activities? yes  Do you have any questions about your discharge instructions: Diet   no Medications  no Follow up visit  no  Do you have questions or concerns about your Care? no  Actions: * If pain score is 4 or above:

## 2019-02-11 NOTE — Telephone Encounter (Signed)
  Follow up Call-  Call back number 02/10/2019  Post procedure Call Back phone  # [phone]  Permission to leave phone message Yes  Some recent data might be hidden   [facility]

## 2019-02-11 NOTE — Progress Notes (Signed)
Virtual Visit via Video Note  I connected with Vallarie Fei on 02/11/19 at  3:00 PM EDT by a video enabled telemedicine application and verified that I am speaking with the correct person using two identifiers.   I discussed the limitations of evaluation and management by telemedicine and the availability of in person appointments. The patient expressed understanding and agreed to proceed.  History of Present Illness: HPI:                                                                Yarnell Kozloski is a 58 y.o. female   Tachycardia/Palpitations - well controlled on Corguard 20 mg. Reports occasionally heart palpitations with exertion such as walking up multiple flights of stairs or brisk walking/light jogging. Does not currently exercise. Denies change in exercise tolerance/DOE.  Anxiety - "okay considering," no panic attacks Mood - denies depressed mood Sleep - endorses nighttime awakenings 2-3 times per night most nights, bedtime 10pm and waking 4:30am, does not feel rested. Taking Lunesta 2 mg 1-2 nights per week on her work nights and states she has fewer awakenings and less restlessness   Abdominal pain/diarrhea/GERD Vinnie Level underwent EGD/colonoscopy yesterday; biopsies are still pending. EGD showed a small hiatal hernia and gastric polyp. decreased, reports she does not feel hungry and has to force herself to eat most meals and has early satiety. Colonoscopy showed 4 sessile polyps and grade 1 internal hemorrhoids.  Chronic Migraines - followed by NP Ladonna at [facility]. Switched from [employer] to [place] due to diarrhea/nausea possibly attributed to [employer] (sx improved 2 weeks after d/c).   Depression screen [facility] 2/9 02/11/2019 03/27/2018 12/06/2017 08/09/2017  Decreased Interest 1 1 0 2  Down, Depressed, Hopeless 0 1 0 2  PHQ - 2 Score 1 2 0 4  Altered sleeping 3 2 3 3   Tired, decreased energy 2 3 2 3   Change in appetite 2 0 0 1  Feeling bad or failure about yourself  0 0 0  0  Trouble concentrating 0 0 0 1  Moving slowly or fidgety/restless 0 0 0 0  Suicidal thoughts 0 0 0 0  PHQ-9 Score 8 7 5 12   Difficult doing work/chores Not difficult at all Somewhat difficult Somewhat difficult -     Past Medical History:  Diagnosis Date  . Anal fissure   . Anxiety   . Breast calcifications on mammogram   . Colon polyp   . Dyslipidemia 06/24/2018  . Elevated cholesterol   . Facet arthropathy, lumbosacral 08/28/2017  . GERD (gastroesophageal reflux disease)   . History of colon polyps   . IBS (irritable bowel syndrome)   . Inappropriate sinus tachycardia   . Inappropriate sinus tachycardia   . Migraines   . Osteopenia   . Osteoporosis of femur without pathological fracture 08/28/2017  . Palpitations   . Premature surgical menopause    Past Surgical History:  Procedure Laterality Date  . ANAL FISSURE REPAIR    . APPENDECTOMY  2000  . COLONOSCOPY W/ POLYPECTOMY  03/2015  . COLONOSCOPY WITH ESOPHAGOGASTRODUODENOSCOPY (EGD)  04/01/2015  . KNEE SURGERY    . OOPHORECTOMY    . [place]  . PELVIC LAPAROSCOPY     x 4 for endometriosis   .  VAGINAL HYSTERECTOMY  1994   Social History   Tobacco Use  . Smoking status: Former Smoker    Last attempt to quit: 08/09/2006    Years since quitting: 12.5  . Smokeless tobacco: Never Used  Substance Use Topics  . Alcohol use: Not Currently   family history includes Alcohol abuse in her father; Breast cancer (age of onset: 40) in her mother; Cancer in her father; Colon cancer in her mother; Dementia in her maternal grandfather; Heart attack in her father and paternal grandfather; Hyperlipidemia in her brother and father; Kidney cancer (age of onset: 70) in her sister; Lung cancer in her maternal grandmother; Parkinson's disease in her maternal grandfather and paternal grandmother; Prostate cancer in her maternal grandfather.    ROS: negative except as noted in the HPI  Medications: Current  Outpatient Medications  Medication Sig Dispense Refill  . baclofen (LIORESAL) 10 MG tablet Take 1 tablet by mouth 3 (three) times daily as needed.  2  . BOTOX 200 units SOLR   3  . Calcium Carbonate-Vitamin D 600-400 MG-UNIT tablet Take 1 tablet by mouth 2 (two) times daily. 60 tablet 11  . Coenzyme Q10 (CO Q 10 PO) Take by mouth.    . Cyanocobalamin (B-12 PO) Take by mouth.    . dicyclomine (BENTYL) 10 MG capsule Take 1 capsule (10 mg total) by mouth 2 (two) times daily. 60 capsule 3  . esomeprazole (NEXIUM) 20 MG capsule Take 1 capsule (20 mg total) by mouth daily before breakfast. 90 capsule 1  . eszopiclone (LUNESTA) 1 MG TABS tablet Take 1-2 tablets (1-2 mg total) by mouth at bedtime as needed for sleep. Take immediately before bedtime 30 tablet 2  . Fremanezumab-vfrm 225 MG/1.5ML SOAJ Inject 225 mg into the skin every 30 (thirty) days.    Marland Kitchen MAGNESIUM PO Take by mouth.    . nadolol (CORGARD) 20 MG tablet TAKE 1 TABLET BY MOUTH DAILY. DUE FOR FOLLOW UP WITH PCP 90 tablet 0  . polycarbophil (FIBERCON) 625 MG tablet Take 625 mg by mouth daily.    . Riboflavin 400 MG CAPS Take 1 capsule by mouth daily.    Marland Kitchen TROKENDI XR 200 MG CP24 Take 200 mg by mouth at bedtime.  3  . Ubrogepant 50 MG TABS Take 50 mg by mouth once as needed for migraine.    . B Complex Vitamins (VITAMIN B-COMPLEX) TABS Take by mouth.     No current facility-administered medications for this visit.    Allergies  Allergen Reactions  . Sulfa Antibiotics Anaphylaxis  . Sumatriptan Succinate Anaphylaxis  . Emgality [Galcanezumab-Gnlm] Nausea Only    GI upset - diarrhea, nausea       Objective:  BP 98/60   Pulse 67   Temp 98.4 F (36.9 C)   Wt 144 lb (65.3 kg)   SpO2 98%   BMI 21.90 kg/m  Neuro: alert and oriented x 3 Psych: cooperative, euthymic mood, affect mood-congruent, speech is articulate, normal rate and volume; thought processes clear and goal-directed, normal judgment, good insight  Lab Results   Component Value Date   CREATININE 0.92 08/29/2018   BUN 17 08/29/2018   NA 142 08/29/2018   K 3.8 08/29/2018   CL 113 (H) 08/29/2018   CO2 22 08/29/2018   Lab Results  Component Value Date   ALT 13 08/29/2018   AST 17 08/29/2018   BILITOT 0.5 08/29/2018   Lab Results  Component Value Date   WBC 10.6 08/29/2018  HGB 13.7 08/29/2018   HCT 39.9 08/29/2018   MCV 85.6 08/29/2018   PLT 252 08/29/2018    Lab Results  Component Value Date   TSH 0.78 11/13/2018     No results found for this or any previous visit (from the past 72 hour(s)). No results found.    Assessment and Plan: 58 y.o. female with   .Lacy was seen today for medication management.  Diagnoses and all orders for this visit:  Encounter for medication management  Paroxysmal sinus tachycardia (HCC) -     nadolol (CORGARD) 20 MG tablet; Take 1 tablet (20 mg total) by mouth daily.  Insomnia secondary to anxiety -     eszopiclone (LUNESTA) 2 MG TABS tablet; Take 1 tablet (2 mg total) by mouth at bedtime as needed for sleep. Take immediately before bedtime  Gastric polyp  Sessile colonic polyp  Hiatal hernia with GERD without esophagitis   Vitals reviewed today. BP soft, asymptomatic Personally reviewed labs and EGD/Colo reports from 02/10/2019  Insomnia - cont Lunesta 2 mg QHS prn. Continue sleep hygiene. PHQ2=1. Depression in partial remission  Inappropriate sinus tach - stable on beta blocker, pulse 67 today. Cont current meds  Chronic fatigue - normal TSH, CBC, CMP, ESR, CRP. Likely multifactorial - anxiety, sleep disorder, chronic migraines, CNS depressants.  Continue current medications Follow-up in 3 months for mental status exam/MOCA  Follow Up Instructions:    I discussed the assessment and treatment plan with the patient. The patient was provided an opportunity to ask questions and all were answered. The patient agreed with the plan and demonstrated an understanding of the  instructions.   The patient was advised to call back or seek an in-person evaluation if the symptoms worsen or if the condition fails to improve as anticipated.  I provided 25 minutes of non-face-to-face time during this encounter.   Trixie Dredge, [place]

## 2019-02-11 NOTE — Patient Instructions (Signed)
Chronic Fatigue Syndrome Chronic fatigue syndrome (CFS) is a condition that causes extreme tiredness (fatigue). This fatigue does not improve with rest, and it gets worse with physical or mental activity. You may have several other symptoms along with fatigue. Symptoms may come and go, but they generally last for months. Sometimes, CFS gets better over time, but it can be a lifelong condition. There is no cure, but there are many possible treatments. You will need to work with your health care providers to find a treatment plan that works best for you. What are the causes? The cause of CFS is not known. There may be more than one cause. Possible causes include:  An infection.  An abnormal body defense system (immune system).  Low blood pressure.  Poor diet.  Physical or emotional stress. What increases the risk? You are more likely to develop this condition if:  You are female.  You are 40?57 years old.  You have a family history of CFS.  You live with a lot of emotional stress. What are the signs or symptoms? The main symptom of CFS is fatigue that is severe enough to interfere with day-to-day activities. This fatigue does not get better with rest, and it gets worse with physical or mental activity. There are eight other major symptoms of CFS:  Lack of energy (malaise) that lasts more than 24 hours after physical exertion.  Sleep that does not relieve fatigue (unrefreshing sleep).  Short-term memory loss or confusion.  Joint pain without redness or swelling.  Muscle aches.  Headaches.  Painful and swollen glands (lymph nodes) in the neck or under the arms.  Sore throat. You may also have:  Abdominal cramps, constipation, or diarrhea (irritable bowel).  Chills.  Night sweats.  Vision changes.  Dizziness.  Mental confusion (brain fog).  Clumsiness.  Sensitivity to food, noise, or odors.  Mood swings, depression, or anxiety attacks. How is this diagnosed?  There are no tests that can diagnose this condition. Your health care provider will make the diagnosis based on your medical history, a physical exam, and a mental health exam. However, it is important to make sure that your symptoms are not caused by another medical condition. You may have lab tests or X-rays to rule out other conditions. For your health care provider to diagnose CFS:  You must have had fatigue for at least 6 straight months.  Fatigue must be your first symptom, and it must be severe enough to interfere with day-to-day activities.  There must be no other cause found for the fatigue.  You must also have at least four of the eight other major symptoms of CFS. How is this treated? There is no cure for CFS. The condition affects everyone differently. You will need to work with your team of health care providers to find the best treatments for your symptoms. Your team may include your primary care provider, physical and exercise therapists, and mental health therapists. Treatment may include:  Improving sleep with a regular bedtime routine.  Avoiding caffeine, alcohol, and tobacco.  Doing light exercise and stretching during the day.  Taking medicines to help you sleep or to relieve joint or muscle pain.  Learning and practicing relaxation techniques.  Using memory aids or doing brainteasers to improve memory and concentration.  Seeing a mental health therapist to evaluate and treat depression, if necessary.  Trying massage therapy, acupuncture, and movement exercises, such as yoga or tai chi. Follow these instructions at home:  Activity  Exercise   regularly, as told by your health care provider.  Avoid fatigue by pacing yourself during the day and getting enough sleep at night.  Go to bed and get up at the same time every day. Eating and drinking  Avoid caffeine and alcohol.  Avoid heavy meals in the evening.  Eat a well-balanced diet. General instructions   Take over-the-counter and prescription medicines only as told by your health care provider.  Do not use herbal or dietary supplements unless they are approved by your health care provider.  Maintain a healthy weight.  Avoid stress and use stress-reducing techniques that you learn in therapy.  Do not use any products that contain nicotine or tobacco, such as cigarettes and e-cigarettes. If you need help quitting, ask your health care provider.  Consider joining a CFS support group.  Keep all follow-up visits as told by your health care provider. This is important. Contact a health care provider if:  Your symptoms do not get better or they get worse.  You feel angry, guilty, anxious, or depressed. This information is not intended to replace advice given to you by your health care provider. Make sure you discuss any questions you have with your health care provider. Document Released: 11/22/2004 Document Revised: 06/21/2016 Document Reviewed: 01/23/2016 Elsevier Interactive Patient Education  2019 [employer].

## 2019-02-12 ENCOUNTER — Encounter: Payer: Self-pay | Admitting: Gastroenterology

## 2019-02-16 MED FILL — UBRELVY 50 MG TABS: 50 | 38 days supply | Qty: 20 | Fill #0

## 2019-02-18 MED FILL — AJOVY 225 MG/1.5ML SOSY: 225 | 30 days supply | Qty: 2 | Fill #0

## 2019-03-09 MED FILL — TROKENDI XR 200 MG CAPSULE: 200 | 30 days supply | Qty: 30 | Fill #0

## 2019-03-18 MED FILL — AJOVY 225 MG/1.5ML SOSY: 225 | 30 days supply | Qty: 2 | Fill #1

## 2019-04-20 ENCOUNTER — Other Ambulatory Visit: Payer: Self-pay | Admitting: Physician Assistant

## 2019-04-20 DIAGNOSIS — K2100 Gastro-esophageal reflux disease with esophagitis, without bleeding: Secondary | ICD-10-CM

## 2019-04-20 MED FILL — AJOVY 225 MG/1.5ML SOSY: 225 | 30 days supply | Qty: 2 | Fill #2

## 2019-04-20 MED FILL — TROKENDI XR 200 MG CAPSULE: 200 | 30 days supply | Qty: 30 | Fill #1

## 2019-04-21 MED FILL — ESOMEPRAZOLE MAGNESIUM 20 M: 20 | 30 days supply | Qty: 30 | Fill #0

## 2019-05-12 MED FILL — NADOLOL 20 MG TAB: 20 | 90 days supply | Qty: 90 | Fill #1

## 2019-05-19 MED FILL — TROKENDI XR 200 MG CAPSULE: 200 | 30 days supply | Qty: 30 | Fill #2

## 2019-05-21 ENCOUNTER — Other Ambulatory Visit: Payer: Self-pay | Admitting: Physician Assistant

## 2019-05-21 DIAGNOSIS — K2100 Gastro-esophageal reflux disease with esophagitis, without bleeding: Secondary | ICD-10-CM

## 2019-05-21 MED FILL — ESOMEPRAZOLE MAGNESIUM 20 M: 20 | 30 days supply | Qty: 30 | Fill #0

## 2019-05-21 MED FILL — BOTOX 200 UNITS VIAL: 200 | 1 days supply | Qty: 1 | Fill #1

## 2019-05-25 MED FILL — AJOVY 225 MG/1.5ML SOSY: 225 | 30 days supply | Qty: 2 | Fill #3

## 2019-05-27 DIAGNOSIS — G43709 Chronic migraine without aura, not intractable, without status migrainosus: Secondary | ICD-10-CM | POA: Diagnosis not present

## 2019-05-27 MED FILL — NADOLOL 40 MG TABLET: 40 | 30 days supply | Qty: 30 | Fill #0

## 2019-06-23 ENCOUNTER — Other Ambulatory Visit: Payer: Self-pay | Admitting: Physician Assistant

## 2019-06-23 DIAGNOSIS — K2100 Gastro-esophageal reflux disease with esophagitis, without bleeding: Secondary | ICD-10-CM

## 2019-06-23 MED FILL — ESOMEPRAZOLE MAGNESIUM 20 M: 20 | 15 days supply | Qty: 15 | Fill #0

## 2019-06-23 MED FILL — TROKENDI XR 200 MG CAPSULE: 200 | 30 days supply | Qty: 30 | Fill #3

## 2019-06-23 MED FILL — AJOVY 225 MG/1.5ML SOSY: 225 | 30 days supply | Qty: 2 | Fill #4

## 2019-07-03 MED FILL — NADOLOL 40 MG TABLET: 40 | 30 days supply | Qty: 30 | Fill #1

## 2019-07-07 ENCOUNTER — Other Ambulatory Visit: Payer: Self-pay

## 2019-07-07 DIAGNOSIS — K2100 Gastro-esophageal reflux disease with esophagitis, without bleeding: Secondary | ICD-10-CM

## 2019-07-07 MED ORDER — ESOMEPRAZOLE MAGNESIUM 20 MG PO CPDR: 20.0000 mg | DELAYED_RELEASE_CAPSULE | Freq: Every day | ORAL | 0 refills | Status: DC

## 2019-07-07 MED FILL — ESOMEPRAZOLE MAGNESIUM 20 M: 20 | 30 days supply | Qty: 30 | Fill #0

## 2019-07-21 MED FILL — AJOVY 225 MG/1.5ML SOSY: 225 | 30 days supply | Qty: 2 | Fill #5

## 2019-07-21 MED FILL — TROKENDI XR 200 MG CAPSULE: 200 | 30 days supply | Qty: 30 | Fill #0

## 2019-07-21 MED FILL — ESOMEPRAZOLE MAGNESIUM 20 M: 20 | 30 days supply | Qty: 30 | Fill #0

## 2019-07-29 MED FILL — AJOVY 225 MG/1.5ML SOSY: 225 | 30 days supply | Qty: 2 | Fill #5

## 2019-07-29 MED FILL — NADOLOL 40 MG TABLET: 40 | 30 days supply | Qty: 30 | Fill #2

## 2019-07-29 MED FILL — UBRELVY 50 MG TABS: 50 | 38 days supply | Qty: 20 | Fill #1

## 2019-07-29 MED FILL — ESOMEPRAZOLE MAGNESIUM 20 M: 20 | 30 days supply | Qty: 30 | Fill #0

## 2019-07-29 MED FILL — TROKENDI XR 200 MG CAPSULE: 200 | 30 days supply | Qty: 30 | Fill #0

## 2019-07-31 ENCOUNTER — Other Ambulatory Visit: Payer: Self-pay | Admitting: Osteopathic Medicine

## 2019-07-31 DIAGNOSIS — Z1231 Encounter for screening mammogram for malignant neoplasm of breast: Secondary | ICD-10-CM

## 2019-08-11 ENCOUNTER — Ambulatory Visit: Payer: 59 | Admitting: Gastroenterology

## 2019-08-19 MED FILL — BOTOX 200 UNITS VIAL: 200 | 1 days supply | Qty: 1 | Fill #0

## 2019-08-26 DIAGNOSIS — G43709 Chronic migraine without aura, not intractable, without status migrainosus: Secondary | ICD-10-CM | POA: Diagnosis not present

## 2019-09-02 MED FILL — NADOLOL 40 MG TABLET: 40 | 30 days supply | Qty: 30 | Fill #3

## 2019-09-02 MED FILL — TROKENDI XR 200 MG CAPSULE: 200 | 30 days supply | Qty: 30 | Fill #1

## 2019-09-03 MED FILL — AJOVY 225 MG/1.5ML SOSY: 225 | 30 days supply | Qty: 2 | Fill #6

## 2019-09-11 ENCOUNTER — Ambulatory Visit: Payer: 59 | Admitting: Osteopathic Medicine

## 2019-09-11 ENCOUNTER — Encounter: Payer: Self-pay | Admitting: Osteopathic Medicine

## 2019-09-11 ENCOUNTER — Other Ambulatory Visit: Payer: Self-pay

## 2019-09-11 ENCOUNTER — Ambulatory Visit
Admission: RE | Admit: 2019-09-11 | Discharge: 2019-09-11 | Disposition: A | Discharge status: Home / Self Care | Payer: 59 | Source: Ambulatory Visit | Attending: Osteopathic Medicine | Admitting: Osteopathic Medicine

## 2019-09-11 DIAGNOSIS — I471 Supraventricular tachycardia: Secondary | ICD-10-CM | POA: Diagnosis not present

## 2019-09-11 DIAGNOSIS — Z1231 Encounter for screening mammogram for malignant neoplasm of breast: Secondary | ICD-10-CM

## 2019-09-11 DIAGNOSIS — K2100 Gastro-esophageal reflux disease with esophagitis, without bleeding: Secondary | ICD-10-CM | POA: Diagnosis not present

## 2019-09-11 DIAGNOSIS — I4719 Other supraventricular tachycardia: Secondary | ICD-10-CM

## 2019-09-11 MED ORDER — ESOMEPRAZOLE MAGNESIUM 20 MG PO CPDR: 20.0000 mg | DELAYED_RELEASE_CAPSULE | Freq: Every day | ORAL | 3 refills | Status: DC

## 2019-09-11 MED ORDER — NADOLOL 40 MG PO TABS: 40.0000 mg | ORAL_TABLET | Freq: Every day | ORAL | 3 refills | Status: DC

## 2019-09-11 MED FILL — ESOMEPRAZOLE MAG DR 20 MG C: 20 | 90 days supply | Qty: 90 | Fill #0

## 2019-09-11 NOTE — Progress Notes (Signed)
HPI: Alice Williams is a 58 y.o. female who  has a past medical history of Anal fissure, Anxiety, Breast calcifications on mammogram, Colon polyp, Dyslipidemia (06/24/2018), Elevated cholesterol, Facet arthropathy, lumbosacral (08/28/2017), GERD (gastroesophageal reflux disease), History of colon polyps, IBS (irritable bowel syndrome), Inappropriate sinus tachycardia, Inappropriate sinus tachycardia, Migraines, Osteopenia, Osteoporosis of femur without pathological fracture (08/28/2017), Palpitations, and Premature surgical menopause.  she presents to [facility] today, 09/11/19,  for chief complaint of:  Establish care w/ new provider  Migraines   Following with neurology for migraines, managing medications and administering periodic Botox treatments.  There was some issues with one of the patient's injectable medications, she had to switch, she has consistent issues with GERD/gastritis and presumably the injectable medications may have exacerbated these issues.  History of tachycardia, fairly well controlled on beta-blocker but she does still have breakthrough episodes particularly with exertion.  She was previously following with cardiology who did not recommend ablation.  Palpitations do not particularly bother her.  Otherwise, patient is feeling pretty well, no major concerns today.     At today's visit 09/11/19 ... PMH, PSH, FH reviewed and updated as needed.  Current medication list and allergy/intolerance hx reviewed and updated as needed. (See remainder of HPI, ROS, Phys Exam below)   No results found.  No results found for this or any previous visit (from the past 72 hour(s)).        ASSESSMENT/PLAN: Diagnoses of Paroxysmal sinus tachycardia (HCC) and Gastroesophageal reflux disease with esophagitis were pertinent to this visit.  Medications refilled Patient advised to discuss migraine issues with her neurologist.   Meds ordered this  encounter  Medications  . nadolol (CORGARD) 40 MG tablet    Sig: Take 1 tablet (40 mg total) by mouth daily.    Dispense:  90 tablet    Refill:  3  . esomeprazole (NEXIUM) 20 MG capsule    Sig: Take 1 capsule (20 mg total) by mouth daily before breakfast.    Dispense:  90 capsule    Refill:  3       Follow-up plan: Return in about 6 months (around 03/10/2020) for ANNUAL (call week prior to visit for lab orders).                                                 ################################################# ################################################# ################################################# #################################################    Current Meds  Medication Sig  . B Complex Vitamins (VITAMIN B-COMPLEX) TABS Take by mouth.  . baclofen (LIORESAL) 10 MG tablet Take 1 tablet by mouth 3 (three) times daily as needed.  Marland Kitchen BOTOX 200 units SOLR   . Calcium Carbonate-Vitamin D 600-400 MG-UNIT tablet Take 1 tablet by mouth 2 (two) times daily.  . Coenzyme Q10 (CO Q 10 PO) Take by mouth.  . Cyanocobalamin (B-12 PO) Take by mouth.  . dicyclomine (BENTYL) 10 MG capsule Take 1 capsule (10 mg total) by mouth 2 (two) times daily.  Marland Kitchen esomeprazole (NEXIUM) 20 MG capsule Take 1 capsule (20 mg total) by mouth daily before breakfast.  . eszopiclone (LUNESTA) 2 MG TABS tablet Take 1 tablet (2 mg total) by mouth at bedtime as needed for sleep. Take immediately before bedtime  . Fremanezumab-vfrm 225 MG/1.5ML SOAJ Inject 225 mg into the skin every 30 (thirty) days.  Marland Kitchen MAGNESIUM PO Take by mouth.  Marland Kitchen  nadolol (CORGARD) 40 MG tablet Take 1 tablet (40 mg total) by mouth daily.  . polycarbophil (FIBERCON) 625 MG tablet Take 625 mg by mouth daily.  . Riboflavin 400 MG CAPS Take 1 capsule by mouth daily.  Marland Kitchen TROKENDI XR 200 MG CP24 Take 200 mg by mouth at bedtime.  Marland Kitchen Ubrogepant 50 MG TABS Take 50 mg by mouth once as needed for migraine.     Allergies  Allergen Reactions  . Sulfa Antibiotics Anaphylaxis  . Sumatriptan Succinate Anaphylaxis  . Emgality [Galcanezumab-Gnlm] Nausea Only    GI upset - diarrhea, nausea       Review of Systems:  Constitutional: No recent illness  HEENT: No  headache, no vision change  Cardiac: No  chest pain, No  pressure, No palpitations  Respiratory:  No  shortness of breath. No  Cough  Gastrointestinal: No  abdominal pain, no change on bowel habits  Musculoskeletal: No new myalgia/arthralgia  Skin: No  Rash  Hem/Onc: No  easy bruising/bleeding, No  abnormal lumps/bumps  Neurologic: No  weakness, No  Dizziness  Psychiatric: No  concerns with depression, No  concerns with anxiety  Exam:  BP 138/86   Pulse 70   Temp (!) 97.5 F (36.4 C) (Oral)   Ht 5\' 8"  (1.727 m)   Wt 149 lb 14.4 oz (68 kg)   BMI 22.79 kg/m   Constitutional: VS see above. General Appearance: alert, well-developed, well-nourished, NAD  Eyes: Normal lids and conjunctive, non-icteric sclera  Ears, Nose, Mouth, Throat: MMM, Normal external inspection ears/nares/mouth/lips/gums.  Neck: No masses, trachea midline.   Respiratory: Normal respiratory effort. no wheeze, no rhonchi, no rales  Cardiovascular: S1/S2 normal, no murmur, no rub/gallop auscultated. RRR.   Musculoskeletal: Gait normal. Symmetric and independent movement of all extremities  Abdominal: non-tender, non-distended, no appreciable organomegaly, neg Murphy's, BS [id number]  Neurological: Normal balance/coordination. No tremor.  Skin: warm, dry, intact.   Psychiatric: Normal judgment/insight. Normal mood and affect. Oriented x3.       Visit summary with medication list and pertinent instructions was printed for patient to review, patient was advised to alert [place] if any updates are needed. All questions at time of visit were answered - patient instructed to contact office with any additional concerns. ER/RTC precautions were reviewed  with the patient and understanding verbalized.   Note: Total time spent 15 minutes, greater than 50% of the visit was spent face-to-face counseling and coordinating care for the following: Diagnoses of Paroxysmal sinus tachycardia (HCC) and Gastroesophageal reflux disease with esophagitis were pertinent to this visit.Marland Kitchen  Please note: voice recognition software was used to produce this document, and typos may escape review. Please contact Dr. Sheppard Coil for any needed clarifications.    Follow up plan: Return in about 6 months (around 03/10/2020) for [place] (call week prior to visit for lab orders).

## 2019-09-25 ENCOUNTER — Other Ambulatory Visit: Payer: Self-pay | Admitting: Physician Assistant

## 2019-09-25 DIAGNOSIS — F419 Anxiety disorder, unspecified: Secondary | ICD-10-CM

## 2019-09-25 MED FILL — UBRELVY 50 MG TABS: 50 | 38 days supply | Qty: 20 | Fill #2

## 2019-09-28 MED ORDER — ESZOPICLONE 2 MG PO TABS: 2.0000 mg | ORAL_TABLET | Freq: Every evening | ORAL | 0 refills | Status: DC | PRN

## 2019-09-28 MED FILL — ESZOPICLONE 2 MG TABLET: 2 | 45 days supply | Qty: 45 | Fill #0

## 2019-10-08 MED FILL — TROKENDI XR 200 MG CAPSULE: 200 | 30 days supply | Qty: 30 | Fill #2

## 2019-10-08 MED FILL — NADOLOL 40 MG TABLET: 40 | 30 days supply | Qty: 30 | Fill #4

## 2019-11-11 MED FILL — NADOLOL 40 MG TABLET: 40 | 30 days supply | Qty: 30 | Fill #5

## 2019-11-11 MED FILL — TROKENDI XR 200 MG CAPSULE: 200 | 30 days supply | Qty: 30 | Fill #3

## 2019-12-09 MED FILL — TROKENDI XR 200 MG CAPSULE: 200 | 30 days supply | Qty: 30 | Fill #4

## 2019-12-09 MED FILL — NADOLOL 40 MG TABS: 40 | 30 days supply | Qty: 30 | Fill #0

## 2019-12-15 ENCOUNTER — Ambulatory Visit: Payer: 59 | Admitting: Osteopathic Medicine

## 2019-12-15 ENCOUNTER — Other Ambulatory Visit: Payer: Self-pay

## 2019-12-15 ENCOUNTER — Encounter: Payer: Self-pay | Admitting: Osteopathic Medicine

## 2019-12-15 VITALS — BP 132/85 | HR 61 | Temp 97.7°F | Wt 146.1 lb

## 2019-12-15 DIAGNOSIS — R0609 Other forms of dyspnea: Secondary | ICD-10-CM

## 2019-12-15 DIAGNOSIS — I471 Supraventricular tachycardia: Secondary | ICD-10-CM

## 2019-12-15 DIAGNOSIS — R002 Palpitations: Secondary | ICD-10-CM | POA: Diagnosis not present

## 2019-12-15 DIAGNOSIS — R0600 Dyspnea, unspecified: Secondary | ICD-10-CM | POA: Diagnosis not present

## 2019-12-15 LAB — D-DIMER, QUANTITATIVE: D-Dimer, Quant: 0.28 mcg/mL FEU (ref <0.50)

## 2019-12-15 NOTE — Addendum Note (Signed)
Addended by: Mertha Finders on: 12/15/2019 08:22 AM   Modules accepted: Orders

## 2019-12-15 NOTE — Patient Instructions (Addendum)
Plan:  This sounds most like paroxysmal sinus tachycardia. Continue beta blocker. We might think about starting a calcium channel blocker like diltiazem, unless labs show concern for something else.   If labs all ok, would have you follow up with cardiology ASAP, they might consider repeat cardiac monitor, discuss medication adjustment, possibly refer for ablation.   May also consider follow-up in our office for lung function testing to evaluate for COPD, but this sounds less likely to me based on history and on physical exam. I'd probably wait and see what cardiology says first.

## 2019-12-15 NOTE — Progress Notes (Signed)
Alice Williams is a 59 y.o. female who presents to  [place] [facility]  today, 12/15/19, seeking care for the following:  The primary encounter diagnosis was Dyspnea on exertion. Diagnoses of Palpitations and Paroxysmal sinus tachycardia ([place]) were also pertinent to this visit.    New concern: . SOB w/ exertion x3 months, huffing and puffing throughout the day. No CP, no dizziness. Hx Sinus Tach, feels palpitations during these episodes of SOB but no presyncope, is happening at relative rest (like doing discharges with patients, going over AVS will get SOB). Former smoker.      ASSESSMENT & PLAN with other pertinent history/findings:  1. Dyspnea on exertion 2. Palpitations 3. Paroxysmal sinus tachycardia [facility])  Patient Instructions  Plan:  This sounds most like paroxysmal sinus tachycardia. Continue beta blocker. We might think about starting a calcium channel blocker like diltiazem, unless labs show concern for something else.   If labs all ok, would have you follow up with cardiology ASAP, they might consider repeat cardiac monitor, discuss medication adjustment, possibly refer for ablation.   May also consider follow-up in our office for lung function testing to evaluate for COPD, but this sounds less likely to me based on history and on physical exam. I'd probably wait and see what cardiology says first.       Orders Placed This Encounter  Procedures  . CBC  . COMPLETE METABOLIC PANEL WITH GFR  . Lipid panel  . TSH  . B Nat Peptide  . D-dimer, quantitative (not at [facility])   EKG interpretation: Rate: 56 Rhythm: sinus No ST/T changes concerning for acute ischemia/infarct  Previous EKG per cardiology notes 02/2015 tachycardic but otherwise ok  Other cardio notes reviewed, pertinent from 03/16/2015: "Holter with sinus tachycardia but no SVT, VT, AVB. No history of structural heart disease. No angina, syncope, chf. Tilt  table test with drop in blood pressure and increase in HR but no syncope. Not diagnostic. ECG today ST 108 bpm. "   No orders of the defined types were placed in this encounter.       Follow-up instructions: Return if symptoms worsen or fail to improve / depending on results.                       BP 132/85 (BP Location: Left Arm, Patient Position: Sitting, Cuff Size: Normal)   Pulse 61   Temp 97.7 F (36.5 C) (Oral)   Wt 146 lb 1.3 oz (66.3 kg)   SpO2 99%   BMI 22.21 kg/m   Current Meds  Medication Sig  . B Complex Vitamins (VITAMIN B-COMPLEX) TABS Take by mouth.  . baclofen (LIORESAL) 10 MG tablet Take 1 tablet by mouth 3 (three) times daily as needed.  Marland Kitchen BOTOX 200 units SOLR   . Calcium Carbonate-Vitamin D 600-400 MG-UNIT tablet Take 1 tablet by mouth 2 (two) times daily.  . Coenzyme Q10 (CO Q 10 PO) Take by mouth.  . Cyanocobalamin (B-12 PO) Take by mouth.  . dicyclomine (BENTYL) 10 MG capsule Take 1 capsule (10 mg total) by mouth 2 (two) times daily.  Marland Kitchen esomeprazole (NEXIUM) 20 MG capsule Take 1 capsule (20 mg total) by mouth daily before breakfast.  . eszopiclone (LUNESTA) 2 MG TABS tablet Take 1 tablet (2 mg total) by mouth at bedtime as needed for sleep. Take immediately before bedtime  . Fremanezumab-vfrm 225 MG/1.5ML SOAJ Inject 225 mg into the skin every 30 (  thirty) days.  Marland Kitchen MAGNESIUM PO Take by mouth.  . nadolol (CORGARD) 40 MG tablet Take 1 tablet (40 mg total) by mouth daily.  . polycarbophil (FIBERCON) 625 MG tablet Take 625 mg by mouth daily.  . Riboflavin 400 MG CAPS Take 1 capsule by mouth daily.  Marland Kitchen TROKENDI XR 200 MG CP24 Take 200 mg by mouth at bedtime.  Marland Kitchen Ubrogepant 50 MG TABS Take 50 mg by mouth once as needed for migraine.    No results found for this or any previous visit (from the past 72 hour(s)).  No results found.  Depression screen [facility] 2/9 02/11/2019 03/27/2018 12/06/2017  Decreased Interest 1 1 0  Down, Depressed, Hopeless  0 1 0  PHQ - 2 Score 1 2 0  Altered sleeping 3 2 3   Tired, decreased energy 2 3 2   Change in appetite 2 0 0  Feeling bad or failure about yourself  0 0 0  Trouble concentrating 0 0 0  Moving slowly or fidgety/restless 0 0 0  Suicidal thoughts 0 0 0  PHQ-9 Score 8 7 5   Difficult doing work/chores Not difficult at all Somewhat difficult Somewhat difficult    GAD 7 : Generalized Anxiety Score 03/27/2018 12/06/2017 09/03/2017  Nervous, Anxious, on Edge 0 0 1  Control/stop worrying 1 2 3   Worry too much - different things 1 2 2   Trouble relaxing 1 2 2   Restless 0 0 2  Easily annoyed or irritable 1 2 2   Afraid - awful might happen 0 0 2  Total GAD 7 Score 4 8 14   Anxiety Difficulty Not difficult at all Not difficult at all Not difficult at all      All questions at time of visit were answered - patient instructed to contact office with any additional concerns or updates.  ER/RTC precautions were reviewed with the patient.  Please note: voice recognition software was used to produce this document, and typos may escape review. Please contact Dr. Sheppard Coil for any needed clarifications.

## 2019-12-16 ENCOUNTER — Encounter: Payer: Self-pay | Admitting: Osteopathic Medicine

## 2019-12-16 LAB — CBC
HCT: 39.4 % (ref 35.0–45.0)
Hemoglobin: 13.3 g/dL (ref 11.7–15.5)
MCH: 30 pg (ref 27.0–33.0)
MCHC: 33.8 g/dL (ref 32.0–36.0)
MCV: 88.7 fL (ref 80.0–100.0)
MPV: 10.6 fL (ref 7.5–12.5)
Platelets: 204 10*3/uL (ref 140–400)
RBC: 4.44 10*6/uL (ref 3.80–5.10)
RDW: 12.8 % (ref 11.0–15.0)
WBC: 6.6 10*3/uL (ref 3.8–10.8)

## 2019-12-16 LAB — COMPLETE METABOLIC PANEL WITH GFR
AG Ratio: 2.2 (calc) (ref 1.0–2.5)
ALT: 12 U/L (ref 6–29)
AST: 13 U/L (ref 10–35)
Albumin: 4.1 g/dL (ref 3.6–5.1)
Alkaline phosphatase (APISO): 92 U/L (ref 37–153)
BUN: 18 mg/dL (ref 7–25)
CO2: 23 mmol/L (ref 20–32)
Calcium: 8.8 mg/dL (ref 8.6–10.4)
Chloride: 115 mmol/L — ABNORMAL HIGH (ref 98–110)
Creat: 0.78 mg/dL (ref 0.50–1.05)
GFR, Est African American: 97 mL/min/{1.73_m2} (ref >=60)
GFR, Est Non African American: 84 mL/min/{1.73_m2} (ref >=60)
Globulin: 1.9 g/dL (calc) (ref 1.9–3.7)
Glucose, Bld: 94 mg/dL (ref 65–99)
Potassium: 3.7 mmol/L (ref 3.5–5.3)
Sodium: 143 mmol/L (ref 135–146)
Total Bilirubin: 0.5 mg/dL (ref 0.2–1.2)
Total Protein: 6 g/dL — ABNORMAL LOW (ref 6.1–8.1)

## 2019-12-16 LAB — LIPID PANEL
Cholesterol: 198 mg/dL (ref <200)
HDL: 41 mg/dL — ABNORMAL LOW (ref >=50)
LDL Cholesterol (Calc): 139 mg/dL (calc) — ABNORMAL HIGH
Non-HDL Cholesterol (Calc): 157 mg/dL (calc) — ABNORMAL HIGH (ref <130)
Total CHOL/HDL Ratio: 4.8 (calc) (ref <5.0)
Triglycerides: 82 mg/dL (ref <150)

## 2019-12-16 LAB — BRAIN NATRIURETIC PEPTIDE: Brain Natriuretic Peptide: 84 pg/mL (ref <100)

## 2019-12-16 LAB — TSH: TSH: 1.03 mIU/L (ref 0.40–4.50)

## 2019-12-17 MED FILL — BOTOX 200 UNITS VIAL: 200 | 30 days supply | Qty: 1 | Fill #1

## 2019-12-17 MED FILL — ESOMEPRAZOLE MAG DR 20 MG C: 20 | 90 days supply | Qty: 90 | Fill #1

## 2020-01-07 ENCOUNTER — Other Ambulatory Visit (HOSPITAL_BASED_OUTPATIENT_CLINIC_OR_DEPARTMENT_OTHER): Payer: Self-pay | Admitting: Family Medicine

## 2020-01-07 DIAGNOSIS — G43709 Chronic migraine without aura, not intractable, without status migrainosus: Secondary | ICD-10-CM | POA: Diagnosis not present

## 2020-01-07 MED FILL — TROKENDI XR 200 MG CAPSULE: 200 | 30 days supply | Qty: 30 | Fill #0

## 2020-01-12 MED FILL — NADOLOL 40 MG TABS: 40 | 30 days supply | Qty: 30 | Fill #1

## 2020-01-12 MED FILL — NURTEC 75 MG TBDP: 75 | 15 days supply | Qty: 8 | Fill #0

## 2020-01-21 NOTE — Progress Notes (Signed)
Referring-Natalie Alexander, DO Reason for referral-dyspnea and palpitations  HPI: 59 year old female for evaluation of dyspnea and palpitations at request of Emeterio Reeve, DO.  Tilt table in 2016 felt consistent with neurocardiogenic syndrome.  By report patient had a monitor May 2016 that showed no significant arrhythmia but did have sinus tachycardia.  Laboratories February 2021 showed normal BNP, hemoglobin 13.3, potassium 3.7, normal D-dimer and TSH 1.03, patient states that for the past 6 months to 1 year she has noticed increased dyspnea on exertion.  She denies orthopnea, PND, pedal edema.  She also states that her heart rate is elevated with ambulation in the 100s.  She also describes occasional back and chest pain with more vigorous activities relieved with rest.  No syncope.  We are now asked to evaluate.  Current Outpatient Medications  Medication Sig Dispense Refill  . B Complex Vitamins (VITAMIN B-COMPLEX) TABS Take by mouth.    . baclofen (LIORESAL) 10 MG tablet Take 1 tablet by mouth 3 (three) times daily as needed.  2  . BOTOX 200 units SOLR   3  . Calcium Carbonate-Vitamin D 600-400 MG-UNIT tablet Take 1 tablet by mouth 2 (two) times daily. 60 tablet 11  . Coenzyme Q10 (CO Q 10 PO) Take by mouth.    . Cyanocobalamin (B-12 PO) Take by mouth.    . dicyclomine (BENTYL) 10 MG capsule Take 1 capsule (10 mg total) by mouth 2 (two) times daily. 60 capsule 3  . esomeprazole (NEXIUM) 20 MG capsule Take 1 capsule (20 mg total) by mouth daily before breakfast. 90 capsule 3  . eszopiclone (LUNESTA) 2 MG TABS tablet Take 1 tablet (2 mg total) by mouth at bedtime as needed for sleep. Take immediately before bedtime 45 tablet 0  . Fremanezumab-vfrm 225 MG/1.5ML SOAJ Inject 225 mg into the skin every 30 (thirty) days.    Marland Kitchen MAGNESIUM PO Take by mouth.    . nadolol (CORGARD) 40 MG tablet Take 1 tablet (40 mg total) by mouth daily. 90 tablet 3  . polycarbophil (FIBERCON) 625 MG tablet  Take 625 mg by mouth daily.    . Riboflavin 400 MG CAPS Take 1 capsule by mouth daily.    . Rimegepant Sulfate (NURTEC) 75 MG TBDP Take by mouth.    . TROKENDI XR 200 MG CP24 Take 200 mg by mouth at bedtime.  3  . Ubrogepant 50 MG TABS Take 50 mg by mouth once as needed for migraine.     No current facility-administered medications for this visit.    Allergies  Allergen Reactions  . Sulfa Antibiotics Anaphylaxis  . Sumatriptan Succinate Anaphylaxis  . Emgality [Galcanezumab-Gnlm] Nausea Only    GI upset - diarrhea, nausea     Past Medical History:  Diagnosis Date  . Anal fissure   . Anxiety   . Breast calcifications on mammogram   . Colon polyp   . Dyslipidemia 06/24/2018  . Elevated cholesterol   . Facet arthropathy, lumbosacral 08/28/2017  . GERD (gastroesophageal reflux disease)   . History of colon polyps   . IBS (irritable bowel syndrome)   . Inappropriate sinus tachycardia   . Migraines   . Osteopenia   . Osteoporosis of femur without pathological fracture 08/28/2017  . Palpitations   . Premature surgical menopause     Past Surgical History:  Procedure Laterality Date  . ANAL FISSURE REPAIR    . APPENDECTOMY  2000  . COLONOSCOPY W/ POLYPECTOMY  03/2015  . COLONOSCOPY WITH ESOPHAGOGASTRODUODENOSCOPY (  EGD)  04/01/2015  . KNEE SURGERY    . OOPHORECTOMY    . [place]  . PELVIC LAPAROSCOPY     x 4 for endometriosis   . VAGINAL HYSTERECTOMY  1994    Social History   Socioeconomic History  . Marital status: Divorced    Spouse name: Not on file  . Number of children: 1  . Years of education: Not on file  . Highest education level: Master's degree (e.g., MA, MS, MEng, MEd, MSW, MBA)  Occupational History  . Occupation: Therapist, sports  Tobacco Use  . Smoking status: Former Smoker    Quit date: 08/09/2006    Years since quitting: 13.4  . Smokeless tobacco: Never Used  Substance and Sexual Activity  . Alcohol use: Not Currently    Comment: rare    . Drug use: No  . Sexual activity: Never    Birth control/protection: Surgical  Other Topics Concern  . Not on file  Social History Narrative   Single, livevs alone in a 2 story house. Drinks one soda a day. Does not exercise regularly.   Social Determinants of Health   Financial Resource Strain:   . Difficulty of Paying Living Expenses:   Food Insecurity:   . Worried About Charity fundraiser in the Last Year:   . Arboriculturist in the Last Year:   Transportation Needs:   . Film/video editor (Medical):   Marland Kitchen Lack of Transportation (Non-Medical):   Physical Activity:   . Days of Exercise per Week:   . Minutes of Exercise per Session:   Stress:   . Feeling of Stress :   Social Connections:   . Frequency of Communication with Friends and Family:   . Frequency of Social Gatherings with Friends and Family:   . Attends Religious Services:   . Active Member of Clubs or Organizations:   . Attends Archivist Meetings:   Marland Kitchen Marital Status:   Intimate Partner Violence:   . Fear of Current or Ex-Partner:   . Emotionally Abused:   Marland Kitchen Physically Abused:   . Sexually Abused:     Family History  Problem Relation Age of Onset  . Colon cancer Mother   . Breast cancer Mother 87       and liver  . Hyperlipidemia Father   . Alcohol abuse Father   . Heart attack Father   . Cancer Father        Head and neck   . Hyperlipidemia Brother   . Lung cancer Maternal Grandmother   . Parkinson's disease Maternal Grandfather   . Dementia Maternal Grandfather   . Prostate cancer Maternal Grandfather   . Parkinson's disease Paternal Grandmother   . Heart attack Paternal Grandfather   . Kidney cancer Sister 92  . Esophageal cancer Neg Hx     ROS: no fevers or chills, productive cough, hemoptysis, dysphasia, odynophagia, melena, hematochezia, dysuria, hematuria, rash, seizure activity, orthopnea, PND, pedal edema, claudication. Remaining systems are negative.  Physical Exam:    Blood pressure 134/84, pulse 70, height 5\' 8"  (1.727 m), weight 148 lb 12.8 oz (67.5 kg).  General:  Well developed/well nourished in NAD Skin warm/dry Patient not depressed No peripheral clubbing Back-normal HEENT-normal/normal eyelids Neck supple/normal carotid upstroke bilaterally; no bruits; no JVD; no thyromegaly chest - CTA/ normal expansion CV - RRR/normal S1 and S2; no murmurs, rubs or gallops;  PMI nondisplaced Abdomen -NT/ND, no HSM, no mass, +  bowel sounds, no bruit 2+ femoral pulses, no bruits Ext-no edema, chords, 2+ DP Neuro-grossly nonfocal  ECG -December 15, 2019-sinus rhythm with no significant ST changes.  Personally reviewed  Today's electrocardiogram shows sinus rhythm at a rate of 70, nonspecific ST changes.  A/P  1 dyspnea-patient describes increasing dyspnea on exertion.  She also has chest tightness and back pain at times.  I will arrange an echocardiogram to assess LV function.  Schedule CTA to exclude coronary disease.  2 palpitations-she describes an elevated heart rate with ambulation.  If symptoms persist we can consider a Holter monitor in the future to evaluate heart rate with activities.  3 hyperlipidemia-Per primary care.  Kirk Ruths, MD

## 2020-01-27 ENCOUNTER — Ambulatory Visit: Payer: 59 | Admitting: Cardiology

## 2020-01-27 ENCOUNTER — Other Ambulatory Visit: Payer: Self-pay

## 2020-01-27 ENCOUNTER — Encounter: Payer: Self-pay | Admitting: Cardiology

## 2020-01-27 VITALS — BP 134/84 | HR 70 | Ht 68.0 in | Wt 148.8 lb

## 2020-01-27 DIAGNOSIS — R072 Precordial pain: Secondary | ICD-10-CM | POA: Diagnosis not present

## 2020-01-27 DIAGNOSIS — R0600 Dyspnea, unspecified: Secondary | ICD-10-CM | POA: Diagnosis not present

## 2020-01-27 DIAGNOSIS — R002 Palpitations: Secondary | ICD-10-CM

## 2020-01-27 DIAGNOSIS — R0609 Other forms of dyspnea: Secondary | ICD-10-CM

## 2020-01-27 MED ORDER — METOPROLOL TARTRATE 100 MG PO TABS: ORAL_TABLET | ORAL | 0 refills | Status: DC

## 2020-01-27 MED FILL — METOPROLOL TARTRATE 100 MG: 100 | 1 days supply | Qty: 1 | Fill #0

## 2020-01-27 NOTE — Patient Instructions (Signed)
Medication Instructions:  NO CHANGE *If you need a refill on your cardiac medications before your next appointment, please call your pharmacy*   Lab Work: If you have labs (blood work) drawn today and your tests are completely normal, you will receive your results only by: Marland Kitchen MyChart Message (if you have MyChart) OR . A paper copy in the mail If you have any lab test that is abnormal or we need to change your treatment, we will call you to review the results.   Testing/Procedures: Your physician has requested that you have an echocardiogram. Echocardiography is a painless test that uses sound waves to create images of your heart. It provides your doctor with information about the size and shape of your heart and how well your heart's chambers and valves are working. This procedure takes approximately one hour. There are no restrictions for this procedure.1ST FLOOR IMAGING DEPARTMENT IN THE [facility]   Your cardiac CT will be scheduled at one of the below locations:   [facility] [address] [place], [place] [address] [phone]   If scheduled at [facility], please arrive at the [facility] main entrance of [facility] 30 minutes prior to test start time. Proceed to the [facility] Radiology Department (first floor) to check-in and test prep.  Please follow these instructions carefully (unless otherwise directed):  Hold all erectile dysfunction medications at least 3 days (72 hrs) prior to test.  On the Night Before the Test: . Be sure to Drink plenty of water. . Do not consume any caffeinated/decaffeinated beverages or chocolate 12 hours prior to your test. . Do not take any antihistamines 12 hours prior to your test. . If you take Metformin do not take 24 hours prior to test.  On the Day of the Test: . Drink plenty of water. Do not drink any water within one hour of the test. . Do not eat any food 4 hours prior to the test. . You  may take your regular medications prior to the test.  . Take metoprolol (Lopressor) 100 MG two hours prior to test. . HOLD Furosemide/Hydrochlorothiazide morning of the test. . FEMALES- please wear underwire-free bra if available       After the Test: . Drink plenty of water. . After receiving IV contrast, you may experience a mild flushed feeling. This is normal. . On occasion, you may experience a mild rash up to 24 hours after the test. This is not dangerous. If this occurs, you can take Benadryl 25 mg and increase your fluid intake. . If you experience trouble breathing, this can be serious. If it is severe call 911 IMMEDIATELY. If it is mild, please call our office. . If you take any of these medications: Glipizide/Metformin, Avandament, Glucavance, please do not take 48 hours after completing test unless otherwise instructed.   Once we have confirmed authorization from your insurance company, we will call you to set up a date and time for your test.   For non-scheduling related questions, please contact the cardiac imaging nurse navigator should you have any questions/concerns: Marchia Bond, RN Navigator Cardiac Imaging [facility] [phone] office  For scheduling needs, including cancellations and rescheduling, please call [phone].      Follow-Up: At [facility], you and your health needs are our priority.  As part of our continuing mission to provide you with exceptional heart care, we have created designated Provider Care Teams.  These  Care Teams include your primary Cardiologist (physician) and Advanced Practice Providers (APPs -  Physician Assistants and Nurse Practitioners) who all work together to provide you with the care you need, when you need it.  We recommend signing up for the patient portal called "MyChart".  Sign up information is provided on this After Visit Summary.  MyChart is used to connect with patients for Virtual Visits  (Telemedicine).  Patients are able to view lab/test results, encounter notes, upcoming appointments, etc.  Non-urgent messages can be sent to your provider as well.   To learn more about what you can do with MyChart, go to [email or web address].    Your next appointment:   8 week(s)  The format for your next appointment:   In Person  Provider:   Kirk Ruths, MD

## 2020-02-01 ENCOUNTER — Other Ambulatory Visit: Payer: Self-pay

## 2020-02-01 ENCOUNTER — Ambulatory Visit (HOSPITAL_BASED_OUTPATIENT_CLINIC_OR_DEPARTMENT_OTHER)
Admission: RE | Admit: 2020-02-01 | Discharge: 2020-02-01 | Disposition: A | Discharge status: Home / Self Care | Payer: 59 | Source: Ambulatory Visit | Attending: Cardiology | Admitting: Cardiology

## 2020-02-01 DIAGNOSIS — R0609 Other forms of dyspnea: Secondary | ICD-10-CM

## 2020-02-01 DIAGNOSIS — R0600 Dyspnea, unspecified: Secondary | ICD-10-CM | POA: Insufficient documentation

## 2020-02-01 DIAGNOSIS — R072 Precordial pain: Secondary | ICD-10-CM | POA: Insufficient documentation

## 2020-02-01 DIAGNOSIS — R002 Palpitations: Secondary | ICD-10-CM | POA: Insufficient documentation

## 2020-02-01 NOTE — Progress Notes (Signed)
  Echocardiogram 2D Echocardiogram has been performed.  Alice Williams 02/01/2020, 11:46 AM

## 2020-02-17 MED FILL — TROKENDI XR 200 MG CAPSULE: 200 | 30 days supply | Qty: 30 | Fill #1

## 2020-02-17 MED FILL — NADOLOL 40 MG TABS: 40 | 30 days supply | Qty: 30 | Fill #2

## 2020-02-25 ENCOUNTER — Telehealth (HOSPITAL_COMMUNITY): Payer: Self-pay | Admitting: Emergency Medicine

## 2020-02-25 NOTE — Telephone Encounter (Signed)
Attempted to call patient regarding upcoming cardiac CT appointment. °Left message on voicemail with name and callback number °Cydne Grahn RN Navigator Cardiac Imaging °[facility] °[phone] Office °[phone] Cell ° °

## 2020-02-26 ENCOUNTER — Other Ambulatory Visit: Payer: Self-pay

## 2020-02-26 ENCOUNTER — Ambulatory Visit (HOSPITAL_COMMUNITY)
Admission: RE | Admit: 2020-02-26 | Discharge: 2020-02-26 | Disposition: A | Discharge status: Home / Self Care | Payer: 59 | Source: Ambulatory Visit | Attending: Cardiology | Admitting: Cardiology

## 2020-02-26 DIAGNOSIS — R0600 Dyspnea, unspecified: Secondary | ICD-10-CM | POA: Insufficient documentation

## 2020-02-26 DIAGNOSIS — R002 Palpitations: Secondary | ICD-10-CM | POA: Diagnosis not present

## 2020-02-26 DIAGNOSIS — R0609 Other forms of dyspnea: Secondary | ICD-10-CM

## 2020-02-26 DIAGNOSIS — R072 Precordial pain: Secondary | ICD-10-CM | POA: Diagnosis not present

## 2020-02-26 MED ORDER — IOHEXOL 350 MG/ML SOLN
80.0000 mL | Freq: Once | INTRAVENOUS | Status: AC | PRN
  Administered 2020-02-26: 80 mL via INTRAVENOUS

## 2020-02-26 MED ORDER — NITROGLYCERIN 0.4 MG SL SUBL
SUBLINGUAL_TABLET | SUBLINGUAL | Status: AC
  Filled 2020-02-26: qty 2

## 2020-02-26 MED ORDER — NITROGLYCERIN 0.4 MG SL SUBL
0.8000 mg | SUBLINGUAL_TABLET | Freq: Once | SUBLINGUAL | Status: AC
  Administered 2020-02-26: 0.8 mg via SUBLINGUAL

## 2020-03-18 MED FILL — ESOMEPRAZOLE MAG DR 20 MG C: 20 | 90 days supply | Qty: 90 | Fill #2

## 2020-03-18 MED FILL — TROKENDI XR 200 MG CAPSULE: 200 | 30 days supply | Qty: 30 | Fill #2

## 2020-03-18 MED FILL — NADOLOL 40 MG TABS: 40 | 30 days supply | Qty: 30 | Fill #3

## 2020-03-21 NOTE — Progress Notes (Deleted)
HPI: FU dyspnea and palpitations.  Tilt table in 2016 felt consistent with neurocardiogenic syndrome.  By report patient had a monitor May 2016 that showed no significant arrhythmia but did have sinus tachycardia.  Laboratories February 2021 showed normal BNP, hemoglobin 13.3, potassium 3.7, normal D-dimer and TSH 1.03.  Echocardiogram May 2021 showed normal LV function.  CTA April 2021 showed calcium score 0 and no coronary disease. Since last seen  Current Outpatient Medications  Medication Sig Dispense Refill  . B Complex Vitamins (VITAMIN B-COMPLEX) TABS Take by mouth.    . baclofen (LIORESAL) 10 MG tablet Take 1 tablet by mouth 3 (three) times daily as needed.  2  . BOTOX 200 units SOLR   3  . Calcium Carbonate-Vitamin D 600-400 MG-UNIT tablet Take 1 tablet by mouth 2 (two) times daily. 60 tablet 11  . Coenzyme Q10 (CO Q 10 PO) Take by mouth.    . Cyanocobalamin (B-12 PO) Take by mouth.    . dicyclomine (BENTYL) 10 MG capsule Take 1 capsule (10 mg total) by mouth 2 (two) times daily. 60 capsule 3  . esomeprazole (NEXIUM) 20 MG capsule Take 1 capsule (20 mg total) by mouth daily before breakfast. 90 capsule 3  . eszopiclone (LUNESTA) 2 MG TABS tablet Take 1 tablet (2 mg total) by mouth at bedtime as needed for sleep. Take immediately before bedtime 45 tablet 0  . Fremanezumab-vfrm 225 MG/1.5ML SOAJ Inject 225 mg into the skin every 30 (thirty) days.    Marland Kitchen MAGNESIUM PO Take by mouth.    . metoprolol tartrate (LOPRESSOR) 100 MG tablet TAKE 2 HOURS PRIOR TO SCAN 1 tablet 0  . nadolol (CORGARD) 40 MG tablet Take 1 tablet (40 mg total) by mouth daily. 90 tablet 3  . polycarbophil (FIBERCON) 625 MG tablet Take 625 mg by mouth daily.    . Riboflavin 400 MG CAPS Take 1 capsule by mouth daily.    . Rimegepant Sulfate (NURTEC) 75 MG TBDP Take by mouth.    . TROKENDI XR 200 MG CP24 Take 200 mg by mouth at bedtime.  3  . Ubrogepant 50 MG TABS Take 50 mg by mouth once as needed for migraine.       No current facility-administered medications for this visit.     Past Medical History:  Diagnosis Date  . Anal fissure   . Anxiety   . Breast calcifications on mammogram   . Colon polyp   . Dyslipidemia 06/24/2018  . Elevated cholesterol   . Facet arthropathy, lumbosacral 08/28/2017  . GERD (gastroesophageal reflux disease)   . History of colon polyps   . IBS (irritable bowel syndrome)   . Inappropriate sinus tachycardia   . Migraines   . Osteopenia   . Osteoporosis of femur without pathological fracture 08/28/2017  . Palpitations   . Premature surgical menopause     Past Surgical History:  Procedure Laterality Date  . ANAL FISSURE REPAIR    . APPENDECTOMY  2000  . COLONOSCOPY W/ POLYPECTOMY  03/2015  . COLONOSCOPY WITH ESOPHAGOGASTRODUODENOSCOPY (EGD)  04/01/2015  . KNEE SURGERY    . OOPHORECTOMY    . [place]  . PELVIC LAPAROSCOPY     x 4 for endometriosis   . VAGINAL HYSTERECTOMY  1994    Social History   Socioeconomic History  . Marital status: Divorced    Spouse name: Not on file  . Number of children: 1  . Years  of education: Not on file  . Highest education level: Master's degree (e.g., MA, MS, MEng, MEd, MSW, MBA)  Occupational History  . Occupation: Therapist, sports  Tobacco Use  . Smoking status: Former Smoker    Quit date: 08/09/2006    Years since quitting: 13.6  . Smokeless tobacco: Never Used  Substance and Sexual Activity  . Alcohol use: Not Currently    Comment: rare  . Drug use: No  . Sexual activity: Never    Birth control/protection: Surgical  Other Topics Concern  . Not on file  Social History Narrative   Single, livevs alone in a 2 story house. Drinks one soda a day. Does not exercise regularly.   Social Determinants of Health   Financial Resource Strain:   . Difficulty of Paying Living Expenses:   Food Insecurity:   . Worried About Charity fundraiser in the Last Year:   . Arboriculturist in the Last Year:    Transportation Needs:   . Film/video editor (Medical):   Marland Kitchen Lack of Transportation (Non-Medical):   Physical Activity:   . Days of Exercise per Week:   . Minutes of Exercise per Session:   Stress:   . Feeling of Stress :   Social Connections:   . Frequency of Communication with Friends and Family:   . Frequency of Social Gatherings with Friends and Family:   . Attends Religious Services:   . Active Member of Clubs or Organizations:   . Attends Archivist Meetings:   Marland Kitchen Marital Status:   Intimate Partner Violence:   . Fear of Current or Ex-Partner:   . Emotionally Abused:   Marland Kitchen Physically Abused:   . Sexually Abused:     Family History  Problem Relation Age of Onset  . Colon cancer Mother   . Breast cancer Mother 65       and liver  . Hyperlipidemia Father   . Alcohol abuse Father   . Heart attack Father   . Cancer Father        Head and neck   . Hyperlipidemia Brother   . Lung cancer Maternal Grandmother   . Parkinson's disease Maternal Grandfather   . Dementia Maternal Grandfather   . Prostate cancer Maternal Grandfather   . Parkinson's disease Paternal Grandmother   . Heart attack Paternal Grandfather   . Kidney cancer Sister 7  . Esophageal cancer Neg Hx     ROS: no fevers or chills, productive cough, hemoptysis, dysphasia, odynophagia, melena, hematochezia, dysuria, hematuria, rash, seizure activity, orthopnea, PND, pedal edema, claudication. Remaining systems are negative.  Physical Exam: Well-developed well-nourished in no acute distress.  Skin is warm and dry.  HEENT is normal.  Neck is supple.  Chest is clear to auscultation with normal expansion.  Cardiovascular exam is regular rate and rhythm.  Abdominal exam nontender or distended. No masses palpated. Extremities show no edema. neuro grossly intact  ECG- personally reviewed  A/P  1 dyspnea-etiology unclear.  Echocardiogram shows normal LV function and cardiac CTA shows calcium score  0 and no coronary disease.  We will not pursue further cardiac evaluation at this point.  2 palpitations-symptoms have improved compared to previous.  We will consider a monitor in the future if her symptoms worsen.  3 hyperlipidemia-followed by primary care.  Kirk Ruths, MD

## 2020-03-23 ENCOUNTER — Ambulatory Visit: Payer: 59 | Admitting: Cardiology

## 2020-03-30 MED FILL — BOTOX 200 UNITS VIAL: 200 | 30 days supply | Qty: 1 | Fill #2

## 2020-04-13 DIAGNOSIS — G43709 Chronic migraine without aura, not intractable, without status migrainosus: Secondary | ICD-10-CM | POA: Diagnosis not present

## 2020-04-18 ENCOUNTER — Ambulatory Visit: Payer: 59 | Admitting: Gastroenterology

## 2020-04-21 MED FILL — TROKENDI XR 200 MG CAPSULE: 200 | 30 days supply | Qty: 30 | Fill #3

## 2020-04-21 MED FILL — NADOLOL 40 MG TABS: 40 | 30 days supply | Qty: 30 | Fill #4

## 2020-05-16 ENCOUNTER — Telehealth: Payer: Self-pay | Admitting: Radiology

## 2020-05-16 NOTE — Telephone Encounter (Signed)
Left message for patient to call [email or web address] to schedule New Headache/Botox in September.

## 2020-05-25 MED FILL — TROKENDI XR 200 MG CAPSULE: 200 | 30 days supply | Qty: 30 | Fill #4

## 2020-05-25 MED FILL — NADOLOL 40 MG TABS: 40 | 30 days supply | Qty: 30 | Fill #5

## 2020-06-01 ENCOUNTER — Ambulatory Visit: Payer: 59 | Admitting: Gastroenterology

## 2020-06-01 ENCOUNTER — Other Ambulatory Visit (HOSPITAL_COMMUNITY)
Admission: RE | Admit: 2020-06-01 | Discharge: 2020-06-01 | Disposition: A | Discharge status: Home / Self Care | Payer: 59 | Source: Ambulatory Visit | Attending: Gastroenterology | Admitting: Gastroenterology

## 2020-06-01 ENCOUNTER — Other Ambulatory Visit: Payer: Self-pay | Admitting: Gastroenterology

## 2020-06-01 ENCOUNTER — Encounter: Payer: Self-pay | Admitting: Gastroenterology

## 2020-06-01 VITALS — BP 104/74 | HR 67 | Ht 68.0 in | Wt 150.2 lb

## 2020-06-01 DIAGNOSIS — R109 Unspecified abdominal pain: Secondary | ICD-10-CM | POA: Insufficient documentation

## 2020-06-01 DIAGNOSIS — R9389 Abnormal findings on diagnostic imaging of other specified body structures: Secondary | ICD-10-CM

## 2020-06-01 LAB — CBC WITH DIFFERENTIAL/PLATELET
Abs Immature Granulocytes: 0.02 10*3/uL (ref 0.00–0.07)
Basophils Absolute: 0 10*3/uL (ref 0.0–0.1)
Basophils Relative: 0 %
Eosinophils Absolute: 0.4 10*3/uL (ref 0.0–0.5)
Eosinophils Relative: 6 %
HCT: 40.9 % (ref 36.0–46.0)
Hemoglobin: 14.1 g/dL (ref 12.0–15.0)
Immature Granulocytes: 0 %
Lymphocytes Relative: 27 %
Lymphs Abs: 2 10*3/uL (ref 0.7–4.0)
MCH: 30.1 pg (ref 26.0–34.0)
MCHC: 34.5 g/dL (ref 30.0–36.0)
MCV: 87.4 fL (ref 80.0–100.0)
Monocytes Absolute: 0.4 10*3/uL (ref 0.1–1.0)
Monocytes Relative: 6 %
Neutro Abs: 4.3 10*3/uL (ref 1.7–7.7)
Neutrophils Relative %: 61 %
Platelets: 201 10*3/uL (ref 150–400)
RBC: 4.68 MIL/uL (ref 3.87–5.11)
RDW: 12.7 % (ref 11.5–15.5)
WBC: 7.1 10*3/uL (ref 4.0–10.5)
nRBC: 0 % (ref 0.0–0.2)

## 2020-06-01 LAB — COMPREHENSIVE METABOLIC PANEL
ALT: 14 U/L (ref 0–44)
AST: 16 U/L (ref 15–41)
Albumin: 4 g/dL (ref 3.5–5.0)
Alkaline Phosphatase: 97 U/L (ref 38–126)
Anion gap: 8 (ref 5–15)
BUN: 14 mg/dL (ref 6–20)
CO2: 22 mmol/L (ref 22–32)
Calcium: 8.7 mg/dL — ABNORMAL LOW (ref 8.9–10.3)
Chloride: 114 mmol/L — ABNORMAL HIGH (ref 98–111)
Creatinine, Ser: 0.91 mg/dL (ref 0.44–1.00)
GFR calc Af Amer: >60 mL/min (ref >60)
GFR calc non Af Amer: >60 mL/min (ref >60)
Glucose, Bld: 85 mg/dL (ref 70–99)
Potassium: 3.9 mmol/L (ref 3.5–5.1)
Sodium: 144 mmol/L (ref 135–145)
Total Bilirubin: 0.3 mg/dL (ref 0.3–1.2)
Total Protein: 6.4 g/dL — ABNORMAL LOW (ref 6.5–8.1)

## 2020-06-01 LAB — C-REACTIVE PROTEIN: CRP: 1 mg/dL — ABNORMAL HIGH (ref <1.0)

## 2020-06-01 LAB — MAGNESIUM: Magnesium: 2.4 mg/dL (ref 1.7–2.4)

## 2020-06-01 MED ORDER — PANTOPRAZOLE SODIUM 40 MG PO TBEC: 40.0000 mg | DELAYED_RELEASE_TABLET | Freq: Every day | ORAL | 6 refills | Status: DC

## 2020-06-01 MED ORDER — ONDANSETRON 4 MG PO TBDP: 4.0000 mg | ORAL_TABLET | Freq: Four times a day (QID) | ORAL | 3 refills | Status: DC | PRN

## 2020-06-01 MED ORDER — DICYCLOMINE HCL 10 MG PO CAPS: 10.0000 mg | ORAL_CAPSULE | Freq: Two times a day (BID) | ORAL | 6 refills | Status: DC

## 2020-06-01 MED FILL — ONDANSETRON ODT 4 MG TABLET: 4 | 7 days supply | Qty: 30 | Fill #0

## 2020-06-01 MED FILL — DICYCLOMINE 10 MG CAPSULE: 10 | 30 days supply | Qty: 60 | Fill #0

## 2020-06-01 MED FILL — PANTOPRAZOLE SOD DR 40 MG T: 40 | 90 days supply | Qty: 90 | Fill #0

## 2020-06-01 NOTE — Patient Instructions (Signed)
If you are age 58 or older, your body mass index should be between 23-30. Your Body mass index is 22.85 kg/m. If this is out of the aforementioned range listed, please consider follow up with your Primary Care Provider.  If you are age 35 or younger, your body mass index should be between 19-25. Your Body mass index is 22.85 kg/m. If this is out of the aformentioned range listed, please consider follow up with your Primary Care Provider.   You have been scheduled for a CT scan of the abdomen and pelvis at [facility].  You are scheduled on          at         . You should arrive 15 minutes prior to your appointment time for registration. Please follow the written instructions below on the day of your exam:  WARNING: IF YOU ARE ALLERGIC TO IODINE/X-RAY DYE, PLEASE NOTIFY RADIOLOGY IMMEDIATELY AT [phone]! YOU WILL BE GIVEN A 13 HOUR PREMEDICATION PREP.  1) Do not eat or drink anything after         (4 hours prior to your test) 2) You have been given 2 bottles of oral contrast to drink. The solution may taste better if refrigerated, but do NOT add ice or any other liquid to this solution. Shake well before drinking.    Drink 1 bottle of contrast @         (2 hours prior to your exam)  Drink 1 bottle of contrast @       (1 hour prior to your exam)  You may take any medications as prescribed with a small amount of water, if necessary. If you take any of the following medications: METFORMIN, GLUCOPHAGE, GLUCOVANCE, AVANDAMET, RIOMET, FORTAMET, [place] MET, JANUMET, GLUMETZA or METAGLIP, you MAY be asked to HOLD this medication 48 hours AFTER the exam.  The purpose of you drinking the oral contrast is to aid in the visualization of your intestinal tract. The contrast solution may cause some diarrhea. Depending on your individual set of symptoms, you may also receive an intravenous injection of x-ray contrast/dye. Plan on being at [facility] for 30 minutes or longer, depending on  the type of exam you are having performed.  This test typically takes 30-45 minutes to complete.  If you have any questions regarding your exam or if you need to reschedule, you may call the CT department at [phone] between the hours of 8:00 am and 5:00 pm, Monday-Friday.  ________________________________________________________________________  Your provider has requested that you go to the basement level for lab work at [facility]. [place], [place]. Press "B" on the elevator. The lab is located at the first door on the left as you exit the elevator.   We have sent the following medications to your pharmacy for you to pick up at your convenience: Bentyl Zofran Protonix  Follow up in three months.  Please call the office for an appointment as the schedule is not available.  Thank you,  Dr. Jackquline Denmark

## 2020-06-01 NOTE — Progress Notes (Signed)
Chief Complaint: FU Abdominal pain  Referring Provider:  Emeterio Reeve, DO      ASSESSMENT AND PLAN;   #1. GERD with small HH (on CT and EGD) with intermittent N/V. Neg [place] 08/2018. Neg HIDA with EF remotely per pt. #2. FH colon cancer (mom) at age 59 (Stage IV).  H/O adenomatous colonic polyps in the past. Neg EGD/colon 01/2019. Rpt colon 01/2024 #3. IBS with alt diarrhea/constipation/LLQ pain. Neg stool studies, CT abdo/pelvis 10/2018 and labs including TSH, sed rate and celiac screen. #4. CT A/P 10/2018-neg except for small 9 mm omental nodule.  Radiology recommend rpt CT in 3 months.  Plan: - Bentyl 10mg  po bid half an hour before meals and bedtime to continue. #60, 6 refills - Zofran 4mg  ODT Q6hrs PRN for N/V, #30, 3 refills  - Change nexium to protonix  40mg  po qd (30), 6 refills - CBC, CMP, Mg, CRP - Repeat CT scan abdo/pelvis for FU of omental nodule as suggested by radiology. - If still have problems, trial of amitriptyline 25mg  po qhs/trial of gluten free diet x 2 weeks, followed by HIDA with EF if needed.   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  Having intermittent abdominal pain mainly epigastric with associated nausea/vomiting.  Bentyl does help.  No weight loss.  Continues to have intermittent abdominal discomfort especially left lower quadrant with associated diarrhea with alternating constipation.  Bentyl does help with that as well.  No weight loss.  Had negative EGD with small bowel biopsies and colonoscopy as detailed below.  Had normal CBC, CMP, lipase previously.  Had normal C-reactive protein, sed rate and TSH.  Celiac screen was negative except for TTG IgG which was borderline positive.  CT scan Abdo/pelvis was unremarkable.   Previously diagnosed as having IBS-had negative EGD and colonoscopy at [facility] 2016  Denies having any intolerance to gluten/milk or cheese.  She asked to drink milk.  Previously when  she was constipated she would take corn and milk with good bowel movements.  No melena or hematochezia.  Has a stressful job.  Not been sleeping very well.  Also with previous history of endometriosis.     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  Hyperplastic polyps.  Repeat in 5 years due to family history. -EGD 01/2019 with negative gastric and small bowel biopsies.  Showed mild gastritis and small HH. Past Medical History:  Diagnosis Date  . Anal fissure   . Anxiety   . Breast calcifications on mammogram   . Colon polyp   . Dyslipidemia 06/24/2018  . Elevated cholesterol   . Facet arthropathy, lumbosacral 08/28/2017  . GERD (gastroesophageal reflux disease)   . History of colon polyps   . IBS (irritable bowel syndrome)   . Inappropriate sinus tachycardia   . Migraines   . Osteopenia   . Osteoporosis of femur without pathological fracture 08/28/2017  . Palpitations   . Premature surgical menopause     Past Surgical History:  Procedure Laterality Date  . ANAL FISSURE REPAIR    . APPENDECTOMY  2000  . COLONOSCOPY W/ POLYPECTOMY  03/2015  . COLONOSCOPY WITH ESOPHAGOGASTRODUODENOSCOPY (EGD)  04/01/2015  . KNEE SURGERY    . OOPHORECTOMY    . [place]  . PELVIC LAPAROSCOPY     x 4 for endometriosis   .  VAGINAL HYSTERECTOMY  1994    Family History  Problem Relation Age of Onset  . Colon cancer Mother   . Breast cancer Mother 29       and liver  . Hyperlipidemia Father   . Alcohol abuse Father   . Heart attack Father   . Cancer Father        Head and neck   . Hyperlipidemia Brother   . Lung cancer Maternal Grandmother   . Parkinson's disease Maternal Grandfather   . Dementia Maternal Grandfather   . Prostate cancer Maternal Grandfather   . Parkinson's disease Paternal Grandmother   . Heart attack Paternal Grandfather   . Kidney cancer Sister 26  . Esophageal  cancer Neg Hx     Social History   Tobacco Use  . Smoking status: Former Smoker    Quit date: 08/09/2006    Years since quitting: 13.8  . Smokeless tobacco: Never Used  Vaping Use  . Vaping Use: Never used  Substance Use Topics  . Alcohol use: Not Currently    Comment: rare  . Drug use: No    Current Outpatient Medications  Medication Sig Dispense Refill  . baclofen (LIORESAL) 10 MG tablet Take 1 tablet by mouth 3 (three) times daily as needed.  2  . BOTOX 200 units SOLR   3  . Calcium Carbonate-Vitamin D 600-400 MG-UNIT tablet Take 1 tablet by mouth 2 (two) times daily. 60 tablet 11  . Coenzyme Q10 (CO Q 10 PO) Take by mouth.    . dicyclomine (BENTYL) 10 MG capsule Take 1 capsule (10 mg total) by mouth 2 (two) times daily. 60 capsule 3  . esomeprazole (NEXIUM) 20 MG capsule Take 1 capsule (20 mg total) by mouth daily before breakfast. 90 capsule 3  . eszopiclone (LUNESTA) 2 MG TABS tablet Take 1 tablet (2 mg total) by mouth at bedtime as needed for sleep. Take immediately before bedtime 45 tablet 0  . nadolol (CORGARD) 40 MG tablet Take 1 tablet (40 mg total) by mouth daily. 90 tablet 3  . polycarbophil (FIBERCON) 625 MG tablet Take 625 mg by mouth daily.    . Riboflavin 400 MG CAPS Take 1 capsule by mouth daily.    . Rimegepant Sulfate (NURTEC) 75 MG TBDP Take by mouth.    . TROKENDI XR 200 MG CP24 Take 200 mg by mouth at bedtime.  3   No current facility-administered medications for this visit.    Allergies  Allergen Reactions  . Sulfa Antibiotics Anaphylaxis  . Sumatriptan Succinate Anaphylaxis  . Emgality [Galcanezumab-Gnlm] Nausea Only    GI upset - diarrhea, nausea    Review of Systems:  Neg Has anxiety and sleep problems Has stressful job.     Physical Exam:    BP 104/74   Pulse 67   Ht 5\' 8"  (1.727 m)   Wt 150 lb 4 oz (68.2 kg)   BMI 22.85 kg/m  Filed Weights   06/01/20 1410  Weight: 150 lb 4 oz (68.2 kg)   Constitutional:  Well-developed, in no  acute distress. Psychiatric: Normal mood and affect. Behavior is normal. HEENT: Pupils normal.  Conjunctivae are normal. No scleral icterus.  Wearing mask. Cardiovascular: Normal rate, regular rhythm. No edema Pulmonary/chest: Effort normal and breath sounds normal. No wheezing, rales or rhonchi. Abdominal: Soft, nondistended.  Mild left lower quadrant abdominal tenderness, no rebound. Bowel sounds active throughout. There are no masses palpable. No hepatomegaly. Rectal:  defered Neurological: Alert and oriented to  person place and time. Skin: Skin is warm and dry. No rashes noted.  Data Reviewed: I have personally reviewed following labs and imaging studies  CBC: CBC Latest Ref Rng & Units 12/15/2019 08/29/2018 06/23/2018  WBC 3.8 - 10.8 Thousand/uL 6.6 10.6 7.0  Hemoglobin 11.7 - 15.5 g/dL 13.3 13.7 13.3  Hematocrit 35 - 45 % 39.4 39.9 39.8  Platelets 140 - 400 Thousand/uL 204 252 220    CMP: CMP Latest Ref Rng & Units 12/15/2019 08/29/2018 06/23/2018  Glucose 65 - 99 mg/dL 94 83 91  BUN 7 - 25 mg/dL 18 17 18   Creatinine 0.50 - 1.05 mg/dL 0.78 0.92 0.97  Sodium 135 - 146 mmol/L 143 142 142  Potassium 3.5 - 5.3 mmol/L 3.7 3.8 4.1  Chloride 98 - 110 mmol/L 115(H) 113(H) 112(H)  CO2 20 - 32 mmol/L 23 22 26   Calcium 8.6 - 10.4 mg/dL 8.8 8.9 9.4  Total Protein 6.1 - 8.1 g/dL 6.0(L) 6.2 6.4  Total Bilirubin 0.2 - 1.2 mg/dL 0.5 0.5 0.4  AST 10 - 35 U/L 13 17 15   ALT 6 - 29 U/L 12 13 13    CT 11/19/2018 IMPRESSION: 1. No acute findings in the abdomen or pelvis. Specifically, no findings to explain the patient's history of left-sided abdominal pain. 2. 9 mm omental nodule identified lower left abdomen. This does not appear to be a diverticulum. Follow-up CT in 3 months recommended to ensure stability. 3. Tiny hiatal hernia.  CT shown to the patient as well.    Carmell Austria, MD 06/01/2020, 2:22 PM  Cc: Emeterio Reeve, DO

## 2020-06-07 ENCOUNTER — Ambulatory Visit: Payer: 59 | Admitting: Osteopathic Medicine

## 2020-06-08 ENCOUNTER — Encounter: Payer: Self-pay | Admitting: Osteopathic Medicine

## 2020-06-08 ENCOUNTER — Ambulatory Visit (INDEPENDENT_AMBULATORY_CARE_PROVIDER_SITE_OTHER): Payer: 59 | Admitting: Osteopathic Medicine

## 2020-06-08 VITALS — BP 110/74 | HR 66 | Wt 146.0 lb

## 2020-06-08 DIAGNOSIS — R400 Somnolence: Secondary | ICD-10-CM

## 2020-06-08 DIAGNOSIS — G4723 Circadian rhythm sleep disorder, irregular sleep wake type: Secondary | ICD-10-CM

## 2020-06-08 MED ORDER — ESCITALOPRAM OXALATE 10 MG PO TABS: 10.0000 mg | ORAL_TABLET | Freq: Every day | ORAL | 0 refills | Status: DC

## 2020-06-08 MED FILL — ESCITALOPRAM 10 MG TABLET: 10 | 90 days supply | Qty: 90 | Fill #0

## 2020-06-08 NOTE — Progress Notes (Signed)
Alice Williams is a 59 y.o. female who presents to  [place] at [facility]  today, 06/08/20, seeking care for the following:  Marland Kitchen Mental health, sleep difficulties, anxiety, worse lately. Struggling most with difficulty sleeping, sleep onset is not an issue but maintenance is a problem. Has tried/failed Belsomra, Ambien, Lunesta, Hydroxyzine. Has been on Zoloft years ago.      ASSESSMENT & PLAN with other pertinent findings:  The primary encounter diagnosis was Daytime somnolence. A diagnosis of Night-waking disorder, irregular sleep-wake type was also pertinent to this visit.    There are no Patient Instructions on file for this visit.  Orders Placed This Encounter  Procedures  . Home sleep test    Meds ordered this encounter  Medications  . escitalopram (LEXAPRO) 10 MG tablet    Sig: Take 1 tablet (10 mg total) by mouth daily.    Dispense:  90 tablet    Refill:  0       Follow-up instructions: Return in about 4 weeks (around 07/06/2020) for VIRTUAL VISIT / IN-OFFICE VISIT - Vincent .                                         BP 110/74 (BP Location: Right Arm, Patient Position: Sitting)   Pulse 66   Wt 146 lb (66.2 kg)   SpO2 98%   BMI 22.20 kg/m   No outpatient medications have been marked as taking for the 06/08/20 encounter (Office Visit) with Emeterio Reeve, DO.    No results found for this or any previous visit (from the past 72 hour(s)).  No results found.     All questions at time of visit were answered - patient instructed to contact office with any additional concerns or updates.  ER/RTC precautions were reviewed with the patient as applicable.   Please note: voice recognition software was used to produce this document, and typos may escape review. Please contact Dr. Sheppard Coil for any needed clarifications.

## 2020-06-13 ENCOUNTER — Other Ambulatory Visit: Payer: Self-pay

## 2020-06-13 ENCOUNTER — Ambulatory Visit (INDEPENDENT_AMBULATORY_CARE_PROVIDER_SITE_OTHER)
Admission: RE | Admit: 2020-06-13 | Discharge: 2020-06-13 | Disposition: A | Discharge status: Home / Self Care | Payer: 59 | Source: Ambulatory Visit | Attending: Gastroenterology | Admitting: Gastroenterology

## 2020-06-13 DIAGNOSIS — R9389 Abnormal findings on diagnostic imaging of other specified body structures: Secondary | ICD-10-CM | POA: Diagnosis not present

## 2020-06-13 DIAGNOSIS — M5126 Other intervertebral disc displacement, lumbar region: Secondary | ICD-10-CM | POA: Diagnosis not present

## 2020-06-13 DIAGNOSIS — N200 Calculus of kidney: Secondary | ICD-10-CM | POA: Diagnosis not present

## 2020-06-13 DIAGNOSIS — M5136 Other intervertebral disc degeneration, lumbar region: Secondary | ICD-10-CM | POA: Diagnosis not present

## 2020-06-13 DIAGNOSIS — I700 Atherosclerosis of aorta: Secondary | ICD-10-CM | POA: Diagnosis not present

## 2020-06-13 DIAGNOSIS — R109 Unspecified abdominal pain: Secondary | ICD-10-CM

## 2020-06-13 MED ORDER — IOHEXOL 300 MG/ML  SOLN
100.0000 mL | Freq: Once | INTRAMUSCULAR | Status: AC | PRN
  Administered 2020-06-13: 100 mL via INTRAVENOUS

## 2020-06-13 NOTE — Progress Notes (Signed)
Virtual Visit via Telephone Note   This visit type was conducted due to national recommendations for restrictions regarding the COVID-19 Pandemic (e.g. social distancing) in an effort to limit this patient's exposure and mitigate transmission in our community.  Due to her co-morbid illnesses, this patient is at least at moderate risk for complications without adequate follow up.  This format is felt to be most appropriate for this patient at this time.  The patient did not have access to video technology/had technical difficulties with video requiring transitioning to audio format only (telephone).  All issues noted in this document were discussed and addressed.  No physical exam could be performed with this format.  Please refer to the patient's chart for her  consent to telehealth for [facility].   Date:  06/14/2020   ID:  Alice Williams, DOB 15-Jun-1961, MRN [id number]  Patient Location:Home Provider Location: Home  PCP:  Emeterio Reeve, DO  Cardiologist:  Dr Stanford Breed  Evaluation Performed:  Follow-Up Visit  Chief Complaint:  Dyspnea and palpitations  History of Present Illness:    FU dyspnea and palpitations. Tilt table in 2016 felt consistent with neurocardiogenic syndrome. By report patient had a monitor May 2016 that showed no significant arrhythmia but did have sinus tachycardia. Echocardiogram April 2021 showed normal LV function. Cardiac CTA April 2021 showed no coronary disease and calcium score of 0. Since last seen, she continues to have occasional palpitations but improved compared to the past.  She continues with dyspnea on exertion.  No orthopnea, PND or pedal edema.  She denies chest pain.  Occasional dizziness with standing.  The patient does not have symptoms concerning for COVID-19 infection (fever, chills, cough, or new shortness of breath).    Past Medical History:  Diagnosis Date   Anal fissure    Anxiety    Breast calcifications on mammogram    Colon  polyp    Dyslipidemia 06/24/2018   Elevated cholesterol    Facet arthropathy, lumbosacral 08/28/2017   GERD (gastroesophageal reflux disease)    History of colon polyps    IBS (irritable bowel syndrome)    Inappropriate sinus tachycardia    Migraines    Osteopenia    Osteoporosis of femur without pathological fracture 08/28/2017   Palpitations    Premature surgical menopause    Past Surgical History:  Procedure Laterality Date   ANAL FISSURE REPAIR     APPENDECTOMY  2000   COLONOSCOPY W/ POLYPECTOMY  03/2015   COLONOSCOPY WITH ESOPHAGOGASTRODUODENOSCOPY (EGD)  04/01/2015   KNEE SURGERY     OOPHORECTOMY     OOPHORECTOMY     1995, 1998   PELVIC LAPAROSCOPY     x 4 for endometriosis    VAGINAL HYSTERECTOMY  1994     Current Meds  Medication Sig   baclofen (LIORESAL) 10 MG tablet Take 1 tablet by mouth 3 (three) times daily as needed.   BOTOX 200 units SOLR    Calcium Carbonate-Vitamin D 600-400 MG-UNIT tablet Take 1 tablet by mouth 2 (two) times daily.   Coenzyme Q10 (CO Q 10 PO) Take by mouth.   dicyclomine (BENTYL) 10 MG capsule Take 1 capsule (10 mg total) by mouth 2 (two) times daily. Take 1/2 hour before and meal and bedtime   escitalopram (LEXAPRO) 10 MG tablet Take 1 tablet (10 mg total) by mouth daily.   eszopiclone (LUNESTA) 2 MG TABS tablet Take 1 tablet (2 mg total) by mouth at bedtime as needed for sleep. Take immediately before  bedtime   nadolol (CORGARD) 40 MG tablet Take 1 tablet (40 mg total) by mouth daily.   ondansetron (ZOFRAN ODT) 4 MG disintegrating tablet Take 1 tablet (4 mg total) by mouth every 6 (six) hours as needed for nausea or vomiting.   pantoprazole (PROTONIX) 40 MG tablet Take 1 tablet (40 mg total) by mouth daily.   polycarbophil (FIBERCON) 625 MG tablet Take 625 mg by mouth daily.   Riboflavin 400 MG CAPS Take 1 capsule by mouth daily.   Rimegepant Sulfate (NURTEC) 75 MG TBDP Take by mouth.   TROKENDI XR 200  MG CP24 Take 200 mg by mouth at bedtime.     Allergies:   Sulfa antibiotics, Sumatriptan succinate, and Emgality [galcanezumab-gnlm]   Social History   Tobacco Use   Smoking status: Former Smoker    Quit date: 08/09/2006    Years since quitting: 13.8   Smokeless tobacco: Never Used  Vaping Use   Vaping Use: Never used  Substance Use Topics   Alcohol use: Not Currently    Comment: rare   Drug use: No     Family Hx: The patient's family history includes Alcohol abuse in her father; Breast cancer (age of onset: 30) in her mother; Cancer in her father; Colon cancer in her mother; Dementia in her maternal grandfather; Heart attack in her father and paternal grandfather; Hyperlipidemia in her brother and father; Kidney cancer (age of onset: 83) in her sister; Lung cancer in her maternal grandmother; Parkinson's disease in her maternal grandfather and paternal grandmother; Prostate cancer in her maternal grandfather. There is no history of Esophageal cancer.  ROS:   Please see the history of present illness.    No Fever, chills  or productive cough All other systems reviewed and are negative.  Labs/Other Tests and Data Reviewed:     Recent Labs: 12/15/2019: Brain Natriuretic Peptide 84; TSH 1.03 06/01/2020: ALT 14; BUN 14; Creatinine, Ser 0.91; Hemoglobin 14.1; Magnesium 2.4; Platelets 201; Potassium 3.9; Sodium 144   Recent Lipid Panel Lab Results  Component Value Date/Time   CHOL 198 12/15/2019 08:34 AM   TRIG 82 12/15/2019 08:34 AM   HDL 41 (L) 12/15/2019 08:34 AM   CHOLHDL 4.8 12/15/2019 08:34 AM   LDLCALC 139 (H) 12/15/2019 08:34 AM    Wt Readings from Last 3 Encounters:  06/14/20 146 lb (66.2 kg)  06/08/20 146 lb (66.2 kg)  06/01/20 150 lb 4 oz (68.2 kg)     Objective:    Vital Signs:  Ht 5\' 8"  (1.727 m)    Wt 146 lb (66.2 kg)    BMI 22.20 kg/m    Vital signs not available. NAD Answers questions appropriately Normal affect Remainder of physical examination  not performed (telehealth visit; coronavirus pandemic)  ASSESSMENT & PLAN:    1. Dyspnea-previous echocardiogram showed preserved LV function and CTA did not show coronary disease. We will not pursue further cardiac evaluation at this point. 2. Palpitations-continue beta-blocker at present dose.  Symptoms reasonably well controlled. 3. Hyperlipidemia-Per primary care.  COVID-19 Education: The importance of social distancing was discussed today.  Time:   Today, I have spent 14 minutes with the patient with telehealth technology discussing the above problems.     Medication Adjustments/Labs and Tests Ordered: Current medicines are reviewed at length with the patient today.  Concerns regarding medicines are outlined above.   Tests Ordered: No orders of the defined types were placed in this encounter.   Medication Changes: No orders of the defined  types were placed in this encounter.   Follow Up:  FU in 1 year(s)  Signed, Kirk Ruths, MD  06/14/2020 8:31 AM    [facility]

## 2020-06-14 ENCOUNTER — Telehealth (INDEPENDENT_AMBULATORY_CARE_PROVIDER_SITE_OTHER): Payer: 59 | Admitting: Cardiology

## 2020-06-14 ENCOUNTER — Encounter: Payer: Self-pay | Admitting: Cardiology

## 2020-06-14 VITALS — Ht 68.0 in | Wt 146.0 lb

## 2020-06-14 DIAGNOSIS — R002 Palpitations: Secondary | ICD-10-CM | POA: Diagnosis not present

## 2020-06-14 DIAGNOSIS — R0609 Other forms of dyspnea: Secondary | ICD-10-CM

## 2020-06-14 DIAGNOSIS — E785 Hyperlipidemia, unspecified: Secondary | ICD-10-CM

## 2020-06-14 DIAGNOSIS — R0600 Dyspnea, unspecified: Secondary | ICD-10-CM | POA: Diagnosis not present

## 2020-06-14 NOTE — Patient Instructions (Signed)
Medication Instructions:  NO CHANGE *If you need a refill on your cardiac medications before your next appointment, please call your pharmacy*   Lab Work: If you have labs (blood work) drawn today and your tests are completely normal, you will receive your results only by: Marland Kitchen MyChart Message (if you have MyChart) OR . A paper copy in the mail If you have any lab test that is abnormal or we need to change your treatment, we will call you to review the results.   Follow-Up: At [facility], you and your health needs are our priority.  As part of our continuing mission to provide you with exceptional heart care, we have created designated Provider Care Teams.  These Care Teams include your primary Cardiologist (physician) and Advanced Practice Providers (APPs -  Physician Assistants and Nurse Practitioners) who all work together to provide you with the care you need, when you need it.  We recommend signing up for the patient portal called "MyChart".  Sign up information is provided on this After Visit Summary.  MyChart is used to connect with patients for Virtual Visits (Telemedicine).  Patients are able to view lab/test results, encounter notes, upcoming appointments, etc.  Non-urgent messages can be sent to your provider as well.   To learn more about what you can do with MyChart, go to [email or web address].    Your next appointment:   12 month(s)  The format for your next appointment:   In Person  Provider:   Kirk Ruths, MD

## 2020-06-19 ENCOUNTER — Encounter: Payer: Self-pay | Admitting: Osteopathic Medicine

## 2020-06-21 ENCOUNTER — Other Ambulatory Visit: Payer: Self-pay

## 2020-06-21 DIAGNOSIS — I471 Supraventricular tachycardia: Secondary | ICD-10-CM

## 2020-06-21 MED ORDER — NADOLOL 40 MG PO TABS: 40.0000 mg | ORAL_TABLET | Freq: Every day | ORAL | 3 refills | Status: DC

## 2020-06-21 MED FILL — TROKENDI XR 200 MG CAPSULE: 200 | 30 days supply | Qty: 30 | Fill #5

## 2020-06-24 ENCOUNTER — Other Ambulatory Visit: Payer: Self-pay

## 2020-06-28 ENCOUNTER — Telehealth: Payer: Self-pay | Admitting: Cardiology

## 2020-06-28 ENCOUNTER — Other Ambulatory Visit: Payer: Self-pay | Admitting: Cardiology

## 2020-06-28 DIAGNOSIS — I4719 Other supraventricular tachycardia: Secondary | ICD-10-CM

## 2020-06-28 DIAGNOSIS — I471 Supraventricular tachycardia: Secondary | ICD-10-CM

## 2020-06-28 MED ORDER — NADOLOL 40 MG PO TABS: 40.0000 mg | ORAL_TABLET | Freq: Every day | ORAL | 3 refills | Status: DC

## 2020-06-28 MED FILL — NURTEC 75 MG TBDP: 75 | 15 days supply | Qty: 8 | Fill #1

## 2020-06-28 MED FILL — NADOLOL 40 MG TABS: 40 | 90 days supply | Qty: 90 | Fill #0

## 2020-06-28 NOTE — Telephone Encounter (Signed)
Spoke with pt and made her aware that I have sent over a new prescription to her pharmacy.  Pt appreciative for assistance.

## 2020-06-28 NOTE — Telephone Encounter (Signed)
Pt c/o medication issue:  1. Name of Medication: nadolol (CORGARD) 40 MG tablet  2. How are you currently taking this medication (dosage and times per day)? As written  3. Are you having a reaction (difficulty breathing--STAT)? As written  4. What is your medication issue? Pt needs new prescription and medication. Wants to know if it was received by Dr. Stanford Breed office

## 2020-07-11 MED FILL — BOTOX 200 UNITS VIAL: 200 | 30 days supply | Qty: 1 | Fill #3

## 2020-07-13 ENCOUNTER — Other Ambulatory Visit: Payer: Self-pay

## 2020-07-13 ENCOUNTER — Other Ambulatory Visit: Payer: Self-pay | Admitting: Family Medicine

## 2020-07-13 ENCOUNTER — Encounter: Payer: Self-pay | Admitting: Family Medicine

## 2020-07-13 ENCOUNTER — Ambulatory Visit (INDEPENDENT_AMBULATORY_CARE_PROVIDER_SITE_OTHER): Payer: 59 | Admitting: Family Medicine

## 2020-07-13 VITALS — Ht 68.0 in | Wt 150.0 lb

## 2020-07-13 DIAGNOSIS — M5442 Lumbago with sciatica, left side: Secondary | ICD-10-CM | POA: Diagnosis not present

## 2020-07-13 DIAGNOSIS — M5441 Lumbago with sciatica, right side: Secondary | ICD-10-CM

## 2020-07-13 MED ORDER — GABAPENTIN 100 MG PO CAPS: 100.0000 mg | ORAL_CAPSULE | Freq: Three times a day (TID) | ORAL | 1 refills | Status: DC

## 2020-07-13 MED FILL — GABAPENTIN 100 MG CAPSULE: 100 | 20 days supply | Qty: 60 | Fill #0

## 2020-07-13 NOTE — Patient Instructions (Signed)
Nice to meet you Please try heat on the low back  Please try the gabapentin. This can make you sleepy so start with it at night. You can increase to two or three times daily as you tolerate.  Physical therapy will give you a call.   Please send me a message in MyChart with any questions or updates.  Please see me back in 4 weeks.   --Dr. Raeford Razor

## 2020-07-13 NOTE — Assessment & Plan Note (Signed)
She has changes observed on CT of the facet joints as well as the L3 region.  She has significant instability with testing which is likely contributing to some of the symptoms that she is currently having. -Counseled on home exercise therapy and supportive care. -Referral to physical therapy. -Gabapentin. -May need to consider MRI for the use of epidurals for evaluation of spinal stenosis.

## 2020-07-13 NOTE — Progress Notes (Signed)
Alice Williams - 58 y.o. female MRN [id number]  Date of birth: [birth year]  SUBJECTIVE:  Including CC & ROS.  Chief Complaint  Patient presents with  . Back Pain    left-sided low back    Alice Williams is a 59 y.o. female that is presenting with acute on chronic low back pain with radicular pain down the backside of each leg.  Symptoms are intermittent in nature.  Seem to be worse when she is sitting in a recliner.  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  No history of injury.  No history of surgery in the back.  Does have a history of left knee surgery.  Has tried ibuprofen with limited improvement..  Independent review of the CT abdomen device from 8/16 shows mild scoliosis in the lumbar spine.   Independent review of the lumbar spine x-ray from 2018 shows degenerative changes of the facet joints in the lower levels of lumbar spine.   Review of Systems See HPI   HISTORY: Past Medical, Surgical, Social, and Family History Reviewed & Updated per EMR.   Pertinent Historical Findings include:  Past Medical History:  Diagnosis Date  . Anal fissure   . Anxiety   . Breast calcifications on mammogram   . Colon polyp   . Dyslipidemia 06/24/2018  . Elevated cholesterol   . Facet arthropathy, lumbosacral 08/28/2017  . GERD (gastroesophageal reflux disease)   . History of colon polyps   . IBS (irritable bowel syndrome)   . Inappropriate sinus tachycardia   . Migraines   . Osteopenia   . Osteoporosis of femur without pathological fracture 08/28/2017  . Palpitations   . Premature surgical menopause     Past Surgical History:  Procedure Laterality Date  . ANAL FISSURE REPAIR    . APPENDECTOMY  2000  . COLONOSCOPY W/ POLYPECTOMY  03/2015  . COLONOSCOPY WITH ESOPHAGOGASTRODUODENOSCOPY (EGD)  04/01/2015  . KNEE SURGERY    . OOPHORECTOMY    . [place]  . PELVIC LAPAROSCOPY     x 4 for endometriosis   . VAGINAL HYSTERECTOMY  1994    Family History    Problem Relation Age of Onset  . Colon cancer Mother   . Breast cancer Mother 66       and liver  . Hyperlipidemia Father   . Alcohol abuse Father   . Heart attack Father   . Cancer Father        Head and neck   . Hyperlipidemia Brother   . Lung cancer Maternal Grandmother   . Parkinson's disease Maternal Grandfather   . Dementia Maternal Grandfather   . Prostate cancer Maternal Grandfather   . Parkinson's disease Paternal Grandmother   . Heart attack Paternal Grandfather   . Kidney cancer Sister 70  . Esophageal cancer Neg Hx     Social History   Socioeconomic History  . Marital status: Divorced    Spouse name: Not on file  . Number of children: 1  . Years of education: Not on file  . Highest education level: Master's degree (e.g., MA, MS, MEng, MEd, MSW, MBA)  Occupational History  . Occupation: Therapist, sports  Tobacco Use  . Smoking status: Former Smoker    Quit date: 08/09/2006    Years since quitting: 13.9  . Smokeless tobacco: Never Used  Vaping Use  . Vaping Use: Never used  Substance and Sexual Activity  . Alcohol use: Not Currently    Comment:  rare  . Drug use: No  . Sexual activity: Never    Birth control/protection: Surgical  Other Topics Concern  . Not on file  Social History Narrative   Single, livevs alone in a 2 story house. Drinks one soda a day. Does not exercise regularly.   Social Determinants of Health   Financial Resource Strain:   . Difficulty of Paying Living Expenses: Not on file  Food Insecurity:   . Worried About Charity fundraiser in the Last Year: Not on file  . Ran Out of Food in the Last Year: Not on file  Transportation Needs:   . Lack of Transportation (Medical): Not on file  . Lack of Transportation (Non-Medical): Not on file  Physical Activity:   . Days of Exercise per Week: Not on file  . Minutes of Exercise per Session: Not on file  Stress:   . Feeling of Stress : Not on file  Social Connections:   . Frequency of Communication  with Friends and Family: Not on file  . Frequency of Social Gatherings with Friends and Family: Not on file  . Attends Religious Services: Not on file  . Active Member of Clubs or Organizations: Not on file  . Attends Archivist Meetings: Not on file  . Marital Status: Not on file  Intimate Partner Violence:   . Fear of Current or Ex-Partner: Not on file  . Emotionally Abused: Not on file  . Physically Abused: Not on file  . Sexually Abused: Not on file     PHYSICAL EXAM:  VS: Ht 5\' 8"  (1.727 m)   Wt 150 lb (68 kg)   BMI 22.81 kg/m  Physical Exam Gen: NAD, alert, cooperative with exam, well-appearing MSK:  Back: Instability with 1 leg standing and hip flexion and abduction. Normal internal and external rotation of the hips. Normal strength resistance with leg flexion extension. +2 deep tendon reflexes at the patella. Normal plantar flexion dorsiflexion. Neurovascularly intact     ASSESSMENT & PLAN:   Acute bilateral low back pain with bilateral sciatica She has changes observed on CT of the facet joints as well as the L3 region.  She has significant instability with testing which is likely contributing to some of the symptoms that she is currently having. -Counseled on home exercise therapy and supportive care. -Referral to physical therapy. -Gabapentin. -May need to consider MRI for the use of epidurals for evaluation of spinal stenosis.

## 2020-07-19 ENCOUNTER — Other Ambulatory Visit: Payer: Self-pay

## 2020-07-19 ENCOUNTER — Ambulatory Visit (INDEPENDENT_AMBULATORY_CARE_PROVIDER_SITE_OTHER): Payer: 59 | Admitting: Physical Therapy

## 2020-07-19 ENCOUNTER — Encounter: Payer: Self-pay | Admitting: Physical Therapy

## 2020-07-19 DIAGNOSIS — G8929 Other chronic pain: Secondary | ICD-10-CM

## 2020-07-19 DIAGNOSIS — M545 Low back pain: Secondary | ICD-10-CM | POA: Diagnosis not present

## 2020-07-19 DIAGNOSIS — M6281 Muscle weakness (generalized): Secondary | ICD-10-CM

## 2020-07-19 DIAGNOSIS — R2681 Unsteadiness on feet: Secondary | ICD-10-CM | POA: Diagnosis not present

## 2020-07-19 NOTE — Patient Instructions (Signed)
Access Code: [email or web address] URL: [email or web address] Date: 07/19/2020 Prepared by: Almyra Free  Exercises Supine Bridge - 2 x daily - 7 x weekly - 1-3 sets - 10 reps - 2-3 sec hold Supine Lower Trunk Rotation - 2 x daily - 7 x weekly - 1 sets - 5 reps - 10 sec hold Seated Hamstring Stretch - 2 x daily - 7 x weekly - 3 reps - 1 sets - 30-60 sec hold Gastroc Stretch on Wall - 2 x daily - 7 x weekly - 3 reps - 1 sets - 30-60 sec hold Gastroc Stretch with Foot at Wall - 1 x daily - 7 x weekly - 1 sets - 3 reps - 30-60 sec hold

## 2020-07-19 NOTE — Therapy (Signed)
[place] [address], [place], [address] Phone: [phone]   Fax:  [phone]  Physical Therapy Evaluation  Patient Details  Name: Alice Williams MRN: [id number] Date of Birth: 21-Mar-1961 Referring Provider (PT): Clearance Coots   Encounter Date: 07/19/2020   PT End of Session - 07/19/20 1104    Visit Number 1    Date for PT Re-Evaluation 09/13/20    Authorization Type UMR    PT Start Time 1104    PT Stop Time 1154    PT Time Calculation (min) 50 min    Activity Tolerance Patient tolerated treatment well    Behavior During Therapy [facility] for tasks assessed/performed           Past Medical History:  Diagnosis Date  . Anal fissure   . Anxiety   . Breast calcifications on mammogram   . Colon polyp   . Dyslipidemia 06/24/2018  . Elevated cholesterol   . Facet arthropathy, lumbosacral 08/28/2017  . GERD (gastroesophageal reflux disease)   . History of colon polyps   . IBS (irritable bowel syndrome)   . Inappropriate sinus tachycardia   . Migraines   . Osteopenia   . Osteoporosis of femur without pathological fracture 08/28/2017  . Palpitations   . Premature surgical menopause     Past Surgical History:  Procedure Laterality Date  . ANAL FISSURE REPAIR    . APPENDECTOMY  2000  . COLONOSCOPY W/ POLYPECTOMY  03/2015  . COLONOSCOPY WITH ESOPHAGOGASTRODUODENOSCOPY (EGD)  04/01/2015  . KNEE SURGERY    . OOPHORECTOMY    . [place]  . PELVIC LAPAROSCOPY     x 4 for endometriosis   . VAGINAL HYSTERECTOMY  1994    There were no vitals filed for this visit.    Subjective Assessment - 07/19/20 1106    Subjective Low back pain with sitting has been limited  for 3-4 years. she gets relief with fetal position.  About 3 months ago noticed couldn't hop (hopsctoch). Burning in upper back started a couple of months ago. Respiraton and heart rate go up with pain. Unsure what causes pain. Pain  goes down both sides to buttocks left > right. On feet 12 hours per day at work. Balance has been worsening. Can't walk a straight line (better with looking at ground). Looking up and talking to someone she falls backwards.    Pertinent History Osteoporosis, stenosis, L3/4 disc right, migraines, anxiety/depression, PTSD ([place]ovid/[place]), insomnia    Limitations Sitting;Walking;Standing    How long can you sit comfortably? 15-20 min    Diagnostic tests CT of abdomen included lumbar    Currently in Pain? Yes    Pain Score 3    8/10 at worst   Pain Location Back    Pain Orientation Right;Left;Lower;Mid    Pain Descriptors / Indicators Burning    Pain Radiating Towards bil gluteals    Pain Onset More than a month ago    Pain Frequency Constant    Aggravating Factors  sitting, prolonged standing    Pain Relieving Factors fetal position              [facility] PT Assessment - 07/19/20 0001      Assessment   Medical Diagnosis acute LBP with bil sciatica    Referring Provider (PT) Clearance Coots    Onset Date/Surgical Date 10/30/15    Hand Dominance Right    Next MD Visit one month  Precautions   Precautions Other (comment)    Precaution Comments Osteoporosis      Restrictions   Weight Bearing Restrictions No      Balance Screen   Has the patient fallen in the past 6 months No    Has the patient had a decrease in activity level because of a fear of falling?  No    Is the patient reluctant to leave their home because of a fear of falling?  No      Home Ecologist residence    Home Layout Multi-level    Additional Comments pain going upstairs      Prior Function   Level of Independence Independent    Vocation Full time employment    Engineer, mining      Cognition   Overall Cognitive Status Within Functional Limits for tasks assessed      Observation/Other Assessments   Focus on Therapeutic Outcomes (FOTO)  48% limited (goal  40%)      Posture/Postural Control   Posture Comments increased kyphosis in sitting      ROM / Strength   AROM / PROM / Strength AROM;Strength      AROM   Overall AROM Comments bil hips WNL    AROM Assessment Site Lumbar    Lumbar Flexion 50%    Lumbar Extension full    Lumbar - Right Side Bend full    Lumbar - Left Side Bend full    Lumbar - Right Rotation 50%    Lumbar - Left Rotation 75%      Strength   Overall Strength Comments bil knees 5/5, DF 5/5; painful to lay on left hip    Strength Assessment Site Hip    Right/Left Hip Right;Left    Right Hip Flexion 4+/5   cogwheel   Right Hip Extension 4/5    Right Hip ABduction 4+/5    Left Hip Flexion 4+/5   cogwheel   Left Hip Extension 4-/5   with pain in low back   Left Hip ABduction 4/5      Flexibility   Soft Tissue Assessment /Muscle Length yes    Hamstrings 140 Right 145 left from hip at 90 deg flexion    Quadriceps some hip flexor tightness bil    ITB WNL    Piriformis WNL    Quadratus Lumborum WNL    Obturator Internus marked gastroc tightness bil      Palpation   Spinal mobility not tested due to OP    Palpation comment left glut min, pirifomis; right mild piriformis      Balance   Balance Assessed Yes      High Level Balance   High Level Balance Comments Bil SLS < 3 sec; tandem stance < 5 sec bil; able to stand 10 sec with eyes closed but significant swaying/strategies; good standiing static balance                      Objective measurements completed on examination: See above findings.               PT Education - 07/19/20 1208    Education Details HEP    Person(s) Educated Patient    Methods Explanation;Demonstration;Handout    Comprehension Returned demonstration;Verbalized understanding            PT Short Term Goals - 07/19/20 1213      PT SHORT TERM GOAL #1   Title  Independent and compliant with initial HEP    Time 2    Period Weeks    Status New    Target  Date 08/02/20      PT SHORT TERM GOAL #2   Title Balance assessment completed (BERG/5x sit to stand) and goals set accordingly)    Time 2    Period Weeks    Status New    Target Date 08/02/20             PT Long Term Goals - 07/19/20 1214      PT LONG TERM GOAL #1   Title Improve core stability with patient to demonstrate improve sitting posture and alignment    Time 8    Period Weeks    Status New    Target Date 09/13/20      PT LONG TERM GOAL #2   Title Patient to report increased sitting tolerance to 2 hours    Time 8    Period Weeks    Status New    Target Date 09/13/20      PT LONG TERM GOAL #3   Title Patient to report 50-75% decreased pain in the back with functional activities including sitting; standing and walking    Time 8    Period Weeks    Status New      PT LONG TERM GOAL #4   Title Independent in HEP    Time 8    Period Weeks    Status New      PT LONG TERM GOAL #5   Title Improve FOTO to </=40% limitation    Time 6    Period Weeks    Status New      Additional Long Term Goals   Additional Long Term Goals Yes      PT LONG TERM GOAL #6   Title Patient able to squat and return to standing without UE assist    Time 8    Period Weeks    Status New                  Plan - 07/19/20 1156    Clinical Impression Statement Patient presents with a 3-4 year history of low back pain referring into bil hips worsening in the past few months and 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 She also reports balance difficulties affecting gait. She has limited lumbar flex and rotation and marked tightness in bil hamstrings and gastrocs. She has BLE weakness both with MMT and functional tests and has core weakness as well. Initial balance screen shows marked difficulties with tandem and SLS activities and the use of significant balance strategies in standing when vision is blocked. She will benefit from further balance assessment at  next visit. Patient will benefit from PT to decrease pain and improve strength and flexibility to improve ADLS, activity tolerance and balance.    Personal Factors and Comorbidities Comorbidity 3+;Fitness;Time since onset of injury/illness/exacerbation    Comorbidities Osteoporosis, stenosis, L3/4 disc right, migraines, anxiety/depression, PTSD ([place]ovid/[place]), insomnia    Examination-Activity Limitations Bend;Sit;Lift    Stability/Clinical Decision Making Evolving/Moderate complexity    Clinical Decision Making Moderate    Rehab Potential Good    PT Frequency 2x / week    PT Duration 8 weeks    PT Treatment/Interventions ADLs/Self Care Home Management;Aquatic Therapy;Cryotherapy;Electrical Stimulation;Iontophoresis 4mg /ml Dexamethasone;Moist Heat;Neuromuscular re-education;Balance training;Therapeutic exercise;Therapeutic activities;Patient/family education;Manual techniques;Dry needling;Taping    PT Next Visit Plan complete and set goals for BERG, 5x  sit to stand; BLE strengthening and flexibility (HS, HF, gastrocs); decompression exercises for OP; DN prn    PT Home Exercise Plan [email or web address]    Consulted and Agree with Plan of Care Patient           Patient will benefit from skilled therapeutic intervention in order to improve the following deficits and impairments:  Decreased range of motion, Pain, Increased muscle spasms, Decreased activity tolerance, Decreased balance, Decreased strength, Impaired flexibility  Visit Diagnosis: Chronic bilateral low back pain, unspecified whether sciatica present - Plan: PT plan of care cert/re-cert  Unsteadiness on feet - Plan: PT plan of care cert/re-cert  Muscle weakness (generalized) - Plan: PT plan of care cert/re-cert     Problem List Patient Active Problem List   Diagnosis Date Noted  . Acute bilateral low back pain with bilateral sciatica 07/13/2020  . Gastric polyp 02/11/2019  . Sessile colonic polyp 02/11/2019  . Chronic fatigue  02/11/2019  . Grade I internal hemorrhoids 02/11/2019  . Ductal hyperplasia of breast 08/21/2018  . Cognitive changes 06/24/2018  . Dyslipidemia 06/24/2018  . Dense breast tissue on mammogram 04/17/2018  . Fibrocystic breast determined by biopsy 04/09/2018  . Family history of breast cancer in mother 04/09/2018  . Paroxysmal sinus tachycardia ([place]) 03/27/2018  . Osteoporosis of femur without pathological fracture 08/28/2017  . Facet arthropathy, lumbosacral 08/28/2017  . Sacrococcygeal pain 08/27/2017  . Left lumbar radiculopathy 08/27/2017  . Allergic rhinitis 08/09/2017  . Disorder of lipoid metabolism 08/09/2017  . Insomnia secondary to anxiety 08/09/2017  . Postmenopausal osteoporosis 08/09/2017  . History of hysterectomy for benign disease 08/09/2017  . GAD (generalized anxiety disorder) 08/09/2017  . Former smoker, stopped smoking in distant past 08/09/2017  . Minor depressive disorder 08/09/2017  . Palpitations 03/16/2015  . Family history of colon cancer 03/02/2015  . Hiatal hernia with GERD without esophagitis 03/02/2015  . History of adenomatous polyp of colon 03/02/2015  . Chronic migraine 10/10/2014  . History of cardiovascular disorder 01/18/2003    Madelyn Flavors PT 07/19/2020, 12:42 PM  [facility] [place], [place], [address] Phone: [phone]   Fax:  [phone]  Name: Catherina Pates MRN: [id number] Date of Birth: [birth year]

## 2020-07-22 ENCOUNTER — Ambulatory Visit: Payer: 59 | Admitting: Physician Assistant

## 2020-07-22 ENCOUNTER — Encounter: Payer: Self-pay | Admitting: Physician Assistant

## 2020-07-22 ENCOUNTER — Other Ambulatory Visit: Payer: Self-pay

## 2020-07-22 ENCOUNTER — Other Ambulatory Visit: Payer: Self-pay | Admitting: Physician Assistant

## 2020-07-22 VITALS — BP 100/67 | HR 65

## 2020-07-22 DIAGNOSIS — M62838 Other muscle spasm: Secondary | ICD-10-CM | POA: Diagnosis not present

## 2020-07-22 DIAGNOSIS — G43709 Chronic migraine without aura, not intractable, without status migrainosus: Secondary | ICD-10-CM

## 2020-07-22 MED ORDER — ONABOTULINUMTOXINA 100 UNITS IJ SOLR
200.0000 [IU] | Freq: Once | INTRAMUSCULAR | Status: AC
  Administered 2020-07-22: 200 [IU] via INTRAMUSCULAR

## 2020-07-22 MED ORDER — BOTOX 200 UNITS IJ SOLR: 200.0000 [IU] | INTRAMUSCULAR | 3 refills | Status: DC

## 2020-07-22 MED ORDER — TROKENDI XR 100 MG PO CP24: 100.0000 mg | ORAL_CAPSULE | Freq: Two times a day (BID) | ORAL | 2 refills | Status: DC

## 2020-07-22 MED FILL — TROKENDI XR 100 MG CAPSULE: 100 | 30 days supply | Qty: 60 | Fill #0

## 2020-07-22 NOTE — Patient Instructions (Signed)
Botulinum Toxin Cosmetic Injection Botulinum toxin injection is a shot that is given to soften lines and wrinkles in the face. The medicine works by weakening the muscles in the face. When muscles become weak, wrinkles are reduced. Tell a doctor about:  Any allergies you have. It is important to tell the doctor if you are allergic to eggs.  All the medicines you take. This includes: ? Antibiotics. ? Vitamins. ? Herbs. ? Eye drops. ? Creams. ? Over-the-counter medicines.  Any problems you or your family have had with the use of anesthetics.  Any blood problems you have.  Any surgeries you have had.  Any medical conditions you have.  If you are pregnant or may be pregnant.  If you are breastfeeding. What are the risks? Generally, this is a safe procedure, but there may be problems. Common problems include:  Pain.  Swelling.  Bruising. Other problems include:  Pain in the jaw.  Pain in the neck.  Allergic reaction to the shot.  Damage to nearby structures or organs.  Drooping eyebrow or eyelid.  Double vision.  Blurred vision.  Changes in voice or speech.  Inability to close one or both eyes.  Trouble swallowing.  Infection. What happens before the procedure?  Do not drink any alcohol as told by your doctor.  Ask your doctor about: ? Changing or stopping your regular medicines. This is important if you take diabetes medicines or blood thinners. ? Taking medicines such as aspirin and ibuprofen. These medicines can thin your blood. Do not take these medicines before the procedure unless your doctor tells you to take them. ? Taking over-the-counter medicines, vitamins, herbs, and supplements. What happens during the procedure?   The area to be treated may be: ? Chilled with ice. ? Numbed with medicine (local anesthetic).  Your doctor will give you some shots of the toxin. How many shots you will get depends on: ? Your condition. ? Which area is being  treated. The procedure may vary among doctors and hospitals. What can I expect after the procedure? After these shots, it is common to have:  Pain, soreness, swelling, itching, or redness on your face.  Small bruises from the needle.  Bumps or marks from the needle.  Loss of feeling in areas where the needle entered the body.  Headache. This is rare. It may take up to 14 days for the treatment to work. It may work sooner for some people. Results will last for about 3-4 months. Follow-up treatment will make this last longer. Follow these instructions at home: Relieving pain and discomfort   Take over-the-counter and prescription medicines only as told by your doctor.  If told, put ice to the injected area: ? Put ice in a plastic bag. ? Place a towel between your skin and the bag. ? Leave the ice on for 20 minutes, 2-3 times per day. Activity Do not do any activities that take a lot of effort. Avoid these activities for 2 hours after the procedure or for as long as told by your doctor. This includes:  Lifting heavy items.  Working out.  Doing activities that make your heart beat faster. General instructions  Do not lie down for 4 hours after treatment or as told by your doctor.  Do not rub the area where you got the shot. This can spread the toxin.  Depending on where the shot was given: ? Your doctor may ask you not to move your muscles in that area. Follow what  you are told. ? Your doctor may tell you to frown or squint regularly. You may need to do these exercises every 15 minutes for 1 hour after the shots. Follow what the doctor tells you. ? Do not get laser treatments, facials, or facial massages for 1-2 weeks after the procedure or for as long as told by your doctor. ? Keep all follow-up visits with your doctor. This is important. Contact a doctor if:  You have a headache that gets worse.  You have a fever. Get help right away if:  You have signs of an allergic  reaction. These include: ? An itchy rash or welts. ? Wheezing or shortness of breath. ? Dizziness.  Your eyelids start to get droopy or swollen.  You have trouble speaking.  You feel short of breath or have trouble breathing.  You start to have trouble swallowing. Summary  Botulinum toxin injection is a shot that is given to soften lines and wrinkles in the face.  This is a safe procedure. However, some problems may occur, including infection, drooping eyelid, double or blurred vision, and trouble swallowing.  Follow your doctor's instructions before the procedure. These may include stopping or changing medicines or stopping alcohol use.  You will be told how to care for yourself after the procedure.  Get help right away if you have chest pain, fever, or an allergic reaction. Also, get help right away if you have problems with vision, trouble speaking, or you feel short of breath, become hoarse, or have trouble swallowing. This information is not intended to replace advice given to you by your health care provider. Make sure you discuss any questions you have with your health care provider. Document Revised: 06/05/2018 Document Reviewed: 06/05/2018 Elsevier Patient Education  [place]. Migraine Headache A migraine headache is an intense, throbbing pain on one side or both sides of the head. Migraine headaches may also cause other symptoms, such as nausea, vomiting, and sensitivity to light and noise. A migraine headache can last from 4 hours to 3 days. Talk with your doctor about what things may bring on (trigger) your migraine headaches. What are the causes? The exact cause of this condition is not known. However, a migraine may be caused when nerves in the brain become irritated and release chemicals that cause inflammation of blood vessels. This inflammation causes pain. This condition may be triggered or caused by:  Drinking alcohol.  Smoking.  Taking medicines, such  as: ? Medicine used to treat chest pain (nitroglycerin). ? Birth control pills. ? Estrogen. ? Certain blood pressure medicines.  Eating or drinking products that contain nitrates, glutamate, aspartame, or tyramine. Aged cheeses, chocolate, or caffeine may also be triggers.  Doing physical activity. Other things that may trigger a migraine headache include:  Menstruation.  Pregnancy.  Hunger.  Stress.  Lack of sleep or too much sleep.  Weather changes.  Fatigue. What increases the risk? The following factors may make you more likely to experience migraine headaches:  Being a certain age. This condition is more common in people who are 58-34 years old.  Being female.  Having a family history of migraine headaches.  Being Caucasian.  Having a mental health condition, such as depression or anxiety.  Being obese. What are the signs or symptoms? The main symptom of this condition is pulsating or throbbing pain. This pain may:  Happen in any area of the head, such as on one side or both sides.  Interfere with daily activities.  Get worse with physical activity.  Get worse with exposure to bright lights or loud noises. Other symptoms may include:  Nausea.  Vomiting.  Dizziness.  General sensitivity to bright lights, loud noises, or smells. Before you get a migraine headache, you may get warning signs (an aura). An aura may include:  Seeing flashing lights or having blind spots.  Seeing bright spots, halos, or zigzag lines.  Having tunnel vision or blurred vision.  Having numbness or a tingling feeling.  Having trouble talking.  Having muscle weakness. Some people have symptoms after a migraine headache (postdromal phase), such as:  Feeling tired.  Difficulty concentrating. How is this diagnosed? A migraine headache can be diagnosed based on:  Your symptoms.  A physical exam.  Tests, such as: ? CT scan or an MRI of the head. These imaging  tests can help rule out other causes of headaches. ? Taking fluid from the spine (lumbar puncture) and analyzing it (cerebrospinal fluid analysis, or CSF analysis). How is this treated? This condition may be treated with medicines that:  Relieve pain.  Relieve nausea.  Prevent migraine headaches. Treatment for this condition may also include:  Acupuncture.  Lifestyle changes like avoiding foods that trigger migraine headaches.  Biofeedback.  Cognitive behavioral therapy. Follow these instructions at home: Medicines  Take over-the-counter and prescription medicines only as told by your health care provider.  Ask your health care provider if the medicine prescribed to you: ? Requires you to avoid driving or using heavy machinery. ? Can cause constipation. You may need to take these actions to prevent or treat constipation:  Drink enough fluid to keep your urine pale yellow.  Take over-the-counter or prescription medicines.  Eat foods that are high in fiber, such as beans, whole grains, and fresh fruits and vegetables.  Limit foods that are high in fat and processed sugars, such as fried or sweet foods. Lifestyle  Do not drink alcohol.  Do not use any products that contain nicotine or tobacco, such as cigarettes, e-cigarettes, and chewing tobacco. If you need help quitting, ask your health care provider.  Get at least 8 hours of sleep every night.  Find ways to manage stress, such as meditation, deep breathing, or yoga. General instructions      Keep a journal to find out what may trigger your migraine headaches. For example, write down: ? What you eat and drink. ? How much sleep you get. ? Any change to your diet or medicines.  If you have a migraine headache: ? Avoid things that make your symptoms worse, such as bright lights. ? It may help to lie down in a dark, quiet room. ? Do not drive or use heavy machinery. ? Ask your health care provider what activities  are safe for you while you are experiencing symptoms.  Keep all follow-up visits as told by your health care provider. This is important. Contact a health care provider if:  You develop symptoms that are different or more severe than your usual migraine headache symptoms.  You have more than 15 headache days in one month. Get help right away if:  Your migraine headache becomes severe.  Your migraine headache lasts longer than 72 hours.  You have a fever.  You have a stiff neck.  You have vision loss.  Your muscles feel weak or like you cannot control them.  You start to lose your balance often.  You have trouble walking.  You faint.  You have a seizure. Summary  A migraine headache is an intense, throbbing pain on one side or both sides of the head. Migraines may also cause other symptoms, such as nausea, vomiting, and sensitivity to light and noise.  This condition may be treated with medicines and lifestyle changes. You may also need to avoid certain things that trigger a migraine headache.  Keep a journal to find out what may trigger your migraine headaches.  Contact your health care provider if you have more than 15 headache days in a month or you develop symptoms that are different or more severe than your usual migraine headache symptoms. This information is not intended to replace advice given to you by your health care provider. Make sure you discuss any questions you have with your health care provider. Document Revised: 02/06/2019 Document Reviewed: 11/27/2018 Elsevier Patient Education  [place].

## 2020-07-22 NOTE — Progress Notes (Signed)
Here for Botox injections History:  Alice Williams is a 59 y.o. who presents to clinic today requesting Botox injections for the prevention of chronic migraine.  She had been getting Botox from the [facility] in [place] but they changed their policy regarding Botox.  Per [facility], she needs to pick up her Botox and take it to the office for injection (which they would no longer allow).  I have reviewed the patient's chart, including her procedural notes for Botox.  She last received injections of Botox just over 3 months ago.  She reports decades of migraines and states the Botox has been helpful with reducing frequency.  She notes no change in the quality of migraine/headaches.  She did note some additional improvement with the addition of nurtec, every other day.  This was started 3 months ago.   She does not believe the Trokendi provides any benefit and states this has been the case for many months.   Even with all of these excellent migraine preventives in place, she has only 5 days of no headache per month.   Number of days in the last 4 weeks with:  Severe headache: 3 Moderate headache: 10 Mild headache: 10  No headache: 5   Past Medical History:  Diagnosis Date  . Anal fissure   . Anxiety   . Breast calcifications on mammogram   . Colon polyp   . Dyslipidemia 06/24/2018  . Elevated cholesterol   . Facet arthropathy, lumbosacral 08/28/2017  . GERD (gastroesophageal reflux disease)   . History of colon polyps   . IBS (irritable bowel syndrome)   . Inappropriate sinus tachycardia   . Migraines   . Osteopenia   . Osteoporosis of femur without pathological fracture 08/28/2017  . Palpitations   . Premature surgical menopause     Social History   Socioeconomic History  . Marital status: Divorced    Spouse name: Not on file  . Number of children: 1  . Years of education: Not on file  . Highest education level: Master's degree (e.g., MA, MS, MEng, MEd,  MSW, MBA)  Occupational History  . Occupation: Therapist, sports  Tobacco Use  . Smoking status: Former Smoker    Quit date: 08/09/2006    Years since quitting: 13.9  . Smokeless tobacco: Never Used  Vaping Use  . Vaping Use: Never used  Substance and Sexual Activity  . Alcohol use: Not Currently    Comment: rare  . Drug use: No  . Sexual activity: Never    Birth control/protection: Surgical  Other Topics Concern  . Not on file  Social History Narrative   Single, livevs alone in a 2 story house. Drinks one soda a day. Does not exercise regularly.   Social Determinants of Health   Financial Resource Strain:   . Difficulty of Paying Living Expenses: Not on file  Food Insecurity:   . Worried About Charity fundraiser in the Last Year: Not on file  . Ran Out of Food in the Last Year: Not on file  Transportation Needs:   . Lack of Transportation (Medical): Not on file  . Lack of Transportation (Non-Medical): Not on file  Physical Activity:   . Days of Exercise per Week: Not on file  . Minutes of Exercise per Session: Not on file  Stress:   . Feeling of Stress : Not on file  Social Connections:   . Frequency of Communication with Friends and Family: Not on file  .  Frequency of Social Gatherings with Friends and Family: Not on file  . Attends Religious Services: Not on file  . Active Member of Clubs or Organizations: Not on file  . Attends Archivist Meetings: Not on file  . Marital Status: Not on file  Intimate Partner Violence:   . Fear of Current or Ex-Partner: Not on file  . Emotionally Abused: Not on file  . Physically Abused: Not on file  . Sexually Abused: Not on file    Family History  Problem Relation Age of Onset  . Colon cancer Mother   . Breast cancer Mother 50       and liver  . Hyperlipidemia Father   . Alcohol abuse Father   . Heart attack Father   . Cancer Father        Head and neck   . Hyperlipidemia Brother   . Lung cancer Maternal Grandmother   .  Parkinson's disease Maternal Grandfather   . Dementia Maternal Grandfather   . Prostate cancer Maternal Grandfather   . Parkinson's disease Paternal Grandmother   . Heart attack Paternal Grandfather   . Kidney cancer Sister 48  . Esophageal cancer Neg Hx     Allergies  Allergen Reactions  . Sulfa Antibiotics Anaphylaxis  . Sumatriptan Succinate Anaphylaxis  . Emgality [Galcanezumab-Gnlm] Nausea Only    GI upset - diarrhea, nausea    Current Outpatient Medications on File Prior to Visit  Medication Sig Dispense Refill  . baclofen (LIORESAL) 10 MG tablet Take 1 tablet by mouth 3 (three) times daily as needed.  2  . Calcium Carbonate-Vitamin D 600-400 MG-UNIT tablet Take 1 tablet by mouth 2 (two) times daily. 60 tablet 11  . Coenzyme Q10 (CO Q 10 PO) Take by mouth.    . dicyclomine (BENTYL) 10 MG capsule Take 1 capsule (10 mg total) by mouth 2 (two) times daily. Take 1/2 hour before and meal and bedtime 60 capsule 6  . escitalopram (LEXAPRO) 10 MG tablet Take 1 tablet (10 mg total) by mouth daily. 90 tablet 0  . eszopiclone (LUNESTA) 2 MG TABS tablet Take 1 tablet (2 mg total) by mouth at bedtime as needed for sleep. Take immediately before bedtime 45 tablet 0  . gabapentin (NEURONTIN) 100 MG capsule Take 1 capsule (100 mg total) by mouth 3 (three) times daily. 60 capsule 1  . nadolol (CORGARD) 40 MG tablet Take 1 tablet (40 mg total) by mouth daily. 90 tablet 3  . ondansetron (ZOFRAN ODT) 4 MG disintegrating tablet Take 1 tablet (4 mg total) by mouth every 6 (six) hours as needed for nausea or vomiting. 30 tablet 3  . pantoprazole (PROTONIX) 40 MG tablet Take 1 tablet (40 mg total) by mouth daily. 30 tablet 6  . polycarbophil (FIBERCON) 625 MG tablet Take 625 mg by mouth daily.    . Riboflavin 400 MG CAPS Take 1 capsule by mouth daily.    . Rimegepant Sulfate (NURTEC) 75 MG TBDP Take by mouth.     No current facility-administered medications on file prior to visit.     Review of  Systems:  All pertinent positive/negative included in HPI, all other review of systems are negative   Objective:  Physical Exam BP 100/67   Pulse 65  CONSTITUTIONAL: Well-developed, well-nourished female in no acute distress.  EYES: EOM intact ENT: Normocephalic CARDIOVASCULAR: Regular rate RESPIRATORY: Normal rate.   MUSCULOSKELETAL: Normal ROM SKIN: Warm, dry without erythema  NEUROLOGICAL: Alert, oriented, CN II-XII grossly intact,  Appropriate balance and gait PSYCH: Normal behavior, mood   Assessment & Plan:  Assessment:insufficient improvement for the below problems Chronic migraine without  Aura  Muscle Spasm  Plan: Botox today.  See below Continue Nurtec every other day Taper down and off trokendi as tolerated.  I have provided sufficient medication should this result in an increase in headache.    Botox Procedure Note Vial of Botox was :Supplied by Patient Lot # [id number] Expiration Date 02/2023   Botox Dosing by [facility] for Chronic Migraine   Injection Sites for Migraines  Botox 200 units was injected using the dosage in the table above in the pattern shown above.        Follow-up in 3 months or sooner PRN  Paticia Stack, PA-C 07/22/2020 1:36 PM

## 2020-07-26 ENCOUNTER — Ambulatory Visit: Payer: 59 | Attending: Internal Medicine

## 2020-07-26 ENCOUNTER — Ambulatory Visit (INDEPENDENT_AMBULATORY_CARE_PROVIDER_SITE_OTHER): Payer: 59 | Admitting: Physical Therapy

## 2020-07-26 ENCOUNTER — Other Ambulatory Visit: Payer: Self-pay

## 2020-07-26 ENCOUNTER — Other Ambulatory Visit (HOSPITAL_BASED_OUTPATIENT_CLINIC_OR_DEPARTMENT_OTHER): Payer: Self-pay | Admitting: Internal Medicine

## 2020-07-26 DIAGNOSIS — R2681 Unsteadiness on feet: Secondary | ICD-10-CM | POA: Diagnosis not present

## 2020-07-26 DIAGNOSIS — M6281 Muscle weakness (generalized): Secondary | ICD-10-CM

## 2020-07-26 DIAGNOSIS — M545 Low back pain: Secondary | ICD-10-CM

## 2020-07-26 DIAGNOSIS — G8929 Other chronic pain: Secondary | ICD-10-CM | POA: Diagnosis not present

## 2020-07-26 DIAGNOSIS — Z23 Encounter for immunization: Secondary | ICD-10-CM

## 2020-07-26 MED FILL — FLUARIX QUADRIVALENT 0.5 ML: 0.5 | 1 days supply | Qty: 1 | Fill #0

## 2020-07-26 MED FILL — DICYCLOMINE 10 MG CAPSULE: 10 | 30 days supply | Qty: 60 | Fill #1

## 2020-07-26 NOTE — Progress Notes (Signed)
   Covid-19 Vaccination Clinic  Name:  Keyasia Jolliff    MRN: [id number] DOB: 1961/04/14  07/26/2020  Ms. Abruzzese was observed post Covid-19 immunization for 15 minutes without incident. She was provided with Vaccine Information Sheet and instruction to access the V-Safe system.  Vaccinated by Sarina Ser  Ms. Igo was instructed to call 911 with any severe reactions post vaccine: Marland Kitchen Difficulty breathing  . Swelling of face and throat  . A fast heartbeat  . A bad rash all over body  . Dizziness and weakness

## 2020-07-26 NOTE — Therapy (Signed)
[place] [facility] [place], [place], [address] Phone: [phone]   Fax:  [phone]  Physical Therapy Treatment  Patient Details  Name: Alice Williams MRN: [id number] Date of Birth: Nov 11, 1960 Referring Provider (PT): Clearance Coots   Encounter Date: 07/26/2020   PT End of Session - 07/26/20 1019    Visit Number 2    Date for PT Re-Evaluation 09/13/20    Authorization Type UMR    PT Start Time 1017    PT Stop Time 1058    PT Time Calculation (min) 41 min    Activity Tolerance Patient tolerated treatment well    Behavior During Therapy [facility] for tasks assessed/performed           Past Medical History:  Diagnosis Date  . Anal fissure   . Anxiety   . Breast calcifications on mammogram   . Colon polyp   . Dyslipidemia 06/24/2018  . Elevated cholesterol   . Facet arthropathy, lumbosacral 08/28/2017  . GERD (gastroesophageal reflux disease)   . History of colon polyps   . IBS (irritable bowel syndrome)   . Inappropriate sinus tachycardia   . Migraines   . Osteopenia   . Osteoporosis of femur without pathological fracture 08/28/2017  . Palpitations   . Premature surgical menopause     Past Surgical History:  Procedure Laterality Date  . ANAL FISSURE REPAIR    . APPENDECTOMY  2000  . COLONOSCOPY W/ POLYPECTOMY  03/2015  . COLONOSCOPY WITH ESOPHAGOGASTRODUODENOSCOPY (EGD)  04/01/2015  . KNEE SURGERY    . OOPHORECTOMY    . [place]  . PELVIC LAPAROSCOPY     x 4 for endometriosis   . VAGINAL HYSTERECTOMY  1994    There were no vitals filed for this visit.   Subjective Assessment - 07/26/20 1020    Subjective Pt reports she is sore, "just getting off of a 3 days on".   She states she has done the exercises, but not faithfully.    Pertinent History Osteoporosis, stenosis, L3/4 disc right, migraines, anxiety/depression, PTSD ([place]ovid/[place]), insomnia    Currently in Pain? Yes     Pain Score 2     Pain Location Back    Pain Orientation Right;Left;Lower    Pain Descriptors / Indicators Burning    Pain Radiating Towards bilat glutes    Aggravating Factors  lifting pts; prolonged walking    Pain Relieving Factors fetal position              [facility] PT Assessment - 07/26/20 0001      Assessment   Medical Diagnosis acute LBP with bil sciatica    Referring Provider (PT) Clearance Coots    Onset Date/Surgical Date 10/30/15    Hand Dominance Right    Next MD Visit to be scheduled.             [place] Adult PT Treatment/Exercise - 07/26/20 0001      Lumbar Exercises: Stretches   Passive Hamstring Stretch Right;Left;2 reps;30 seconds   seated with straight back   Double Knee to Chest Stretch 1 rep;10 seconds    Lower Trunk Rotation 2 reps    Lower Trunk Rotation Limitations increased LBP    Standing Extension 1 rep;5 seconds    Piriformis Stretch Right;Left;2 reps;20 seconds   seated and modified pigeon pose   Gastroc Stretch Right;Left;2 reps;20 seconds   runners stretch     Lumbar Exercises: Aerobic  Nustep L5: arms/legs x 5 min     Other Aerobic Exercise single laps around gym to assess response to exercise       Lumbar Exercises: Seated   Other Seated Lumbar Exercises TA engaged with one sit to stand       Lumbar Exercises: Supine   Bridge 10 reps;3 seconds   with TA engaged.      Lumbar Exercises: Sidelying   Clam Right;Left;10 reps    Hip Abduction Right;Left;10 reps    Other Sidelying Lumbar Exercises open book x 4 reps each side.                     PT Short Term Goals - 07/19/20 1213      PT SHORT TERM GOAL #1   Title Independent and compliant with initial HEP    Time 2    Period Weeks    Status New    Target Date 08/02/20      PT SHORT TERM GOAL #2   Title Balance assessment completed (BERG/5x sit to stand) and goals set accordingly)    Time 2    Period Weeks    Status New    Target Date 08/02/20             PT  Long Term Goals - 07/19/20 1214      PT LONG TERM GOAL #1   Title Improve core stability with patient to demonstrate improve sitting posture and alignment    Time 8    Period Weeks    Status New    Target Date 09/13/20      PT LONG TERM GOAL #2   Title Patient to report increased sitting tolerance to 2 hours    Time 8    Period Weeks    Status New    Target Date 09/13/20      PT LONG TERM GOAL #3   Title Patient to report 50-75% decreased pain in the back with functional activities including sitting; standing and walking    Time 8    Period Weeks    Status New      PT LONG TERM GOAL #4   Title Independent in HEP    Time 8    Period Weeks    Status New      PT LONG TERM GOAL #5   Title Improve FOTO to </=40% limitation    Time 6    Period Weeks    Status New      Additional Long Term Goals   Additional Long Term Goals Yes      PT LONG TERM GOAL #6   Title Patient able to squat and return to standing without UE assist    Time 8    Period Weeks    Status New                 Plan - 07/26/20 1058    Clinical Impression Statement Pt reported some increased lumbar discomfort with lower trunk rotation; modified it to the sidelying open book with good tolerance.  Pt reported reduction of tightness in LE and back at end of session.  Goals are ongoing.    Personal Factors and Comorbidities Comorbidity 3+;Fitness;Time since onset of injury/illness/exacerbation    Comorbidities Osteoporosis, stenosis, L3/4 disc right, migraines, anxiety/depression, PTSD ([place]ovid/[place]), insomnia    Examination-Activity Limitations Bend;Sit;Lift    Stability/Clinical Decision Making Evolving/Moderate complexity    Rehab Potential Good    PT Frequency  2x / week    PT Duration 8 weeks    PT Treatment/Interventions ADLs/Self Care Home Management;Aquatic Therapy;Cryotherapy;Electrical Stimulation;Iontophoresis 4mg /ml Dexamethasone;Moist Heat;Neuromuscular re-education;Balance  training;Therapeutic exercise;Therapeutic activities;Patient/family education;Manual techniques;Dry needling;Taping    PT Next Visit Plan complete and set goals for BERG, 5x sit to stand; BLE strengthening and flexibility (HS, HF, gastrocs); decompression exercises for OP; DN prn    PT Home Exercise Plan [id number]    Consulted and Agree with Plan of Care Patient           Patient will benefit from skilled therapeutic intervention in order to improve the following deficits and impairments:  Decreased range of motion, Pain, Increased muscle spasms, Decreased activity tolerance, Decreased balance, Decreased strength, Impaired flexibility  Visit Diagnosis: Chronic bilateral low back pain, unspecified whether sciatica present  Unsteadiness on feet  Muscle weakness (generalized)     Problem List Patient Active Problem List   Diagnosis Date Noted  . Acute bilateral low back pain with bilateral sciatica 07/13/2020  . Gastric polyp 02/11/2019  . Sessile colonic polyp 02/11/2019  . Chronic fatigue 02/11/2019  . Grade I internal hemorrhoids 02/11/2019  . Ductal hyperplasia of breast 08/21/2018  . Cognitive changes 06/24/2018  . Dyslipidemia 06/24/2018  . Dense breast tissue on mammogram 04/17/2018  . Fibrocystic breast determined by biopsy 04/09/2018  . Family history of breast cancer in mother 04/09/2018  . Paroxysmal sinus tachycardia ([place]) 03/27/2018  . Osteoporosis of femur without pathological fracture 08/28/2017  . Facet arthropathy, lumbosacral 08/28/2017  . Sacrococcygeal pain 08/27/2017  . Left lumbar radiculopathy 08/27/2017  . Allergic rhinitis 08/09/2017  . Disorder of lipoid metabolism 08/09/2017  . Insomnia secondary to anxiety 08/09/2017  . Postmenopausal osteoporosis 08/09/2017  . History of hysterectomy for benign disease 08/09/2017  . GAD (generalized anxiety disorder) 08/09/2017  . Former smoker, stopped smoking in distant past 08/09/2017  . Minor depressive  disorder 08/09/2017  . Palpitations 03/16/2015  . Family history of colon cancer 03/02/2015  . Hiatal hernia with GERD without esophagitis 03/02/2015  . History of adenomatous polyp of colon 03/02/2015  . Chronic migraine 10/10/2014  . History of cardiovascular disorder 01/18/2003   Kerin Perna, PTA 07/26/20 11:06 AM  [place], [place], [address] Phone: [phone]   Fax:  [phone]  Name: Tyrena Gohr MRN: [id number] Date of Birth: March 30, 1961

## 2020-07-29 ENCOUNTER — Encounter: Payer: 59 | Admitting: Physical Therapy

## 2020-07-29 MED FILL — PFIZER-BIONTECH COVID-19 VA: 30 | 1 days supply | Qty: 0 | Fill #0

## 2020-08-01 ENCOUNTER — Encounter: Payer: Self-pay | Admitting: Physical Therapy

## 2020-08-01 ENCOUNTER — Other Ambulatory Visit: Payer: Self-pay

## 2020-08-01 ENCOUNTER — Ambulatory Visit (INDEPENDENT_AMBULATORY_CARE_PROVIDER_SITE_OTHER): Payer: 59 | Admitting: Physical Therapy

## 2020-08-01 DIAGNOSIS — R2681 Unsteadiness on feet: Secondary | ICD-10-CM

## 2020-08-01 DIAGNOSIS — M545 Low back pain, unspecified: Secondary | ICD-10-CM

## 2020-08-01 DIAGNOSIS — G8929 Other chronic pain: Secondary | ICD-10-CM

## 2020-08-01 DIAGNOSIS — M6281 Muscle weakness (generalized): Secondary | ICD-10-CM

## 2020-08-01 NOTE — Therapy (Signed)
[place], [place], [address] Phone: [phone]   Fax:  [phone]  Physical Therapy Treatment  Patient Details  Name: Alice Williams MRN: [id number] Date of Birth: 04-23-1961 Referring Provider (PT): Clearance Coots   Encounter Date: 08/01/2020   PT End of Session - 08/01/20 0854    Visit Number 3    Date for PT Re-Evaluation 09/13/20    Authorization Type UMR    PT Start Time 0850    PT Stop Time 0932    PT Time Calculation (min) 42 min    Activity Tolerance Patient tolerated treatment well    Behavior During Therapy [facility] for tasks assessed/performed           Past Medical History:  Diagnosis Date  . Anal fissure   . Anxiety   . Breast calcifications on mammogram   . Colon polyp   . Dyslipidemia 06/24/2018  . Elevated cholesterol   . Facet arthropathy, lumbosacral 08/28/2017  . GERD (gastroesophageal reflux disease)   . History of colon polyps   . IBS (irritable bowel syndrome)   . Inappropriate sinus tachycardia   . Migraines   . Osteopenia   . Osteoporosis of femur without pathological fracture 08/28/2017  . Palpitations   . Premature surgical menopause     Past Surgical History:  Procedure Laterality Date  . ANAL FISSURE REPAIR    . APPENDECTOMY  2000  . COLONOSCOPY W/ POLYPECTOMY  03/2015  . COLONOSCOPY WITH ESOPHAGOGASTRODUODENOSCOPY (EGD)  04/01/2015  . KNEE SURGERY    . OOPHORECTOMY    . [place]  . PELVIC LAPAROSCOPY     x 4 for endometriosis   . VAGINAL HYSTERECTOMY  1994    There were no vitals filed for this visit.   Subjective Assessment - 08/01/20 0855    Subjective Pt reports she had the flu shot and booster shot day of last session.  Didn't feel well and wasn't able to return to work until Friday. She has tried to complete some stretches at work (ie: calf stretches and L stretch)   Currently in Pain? Yes    Pain Score 3     Pain  Location Hip    Pain Orientation Right;Left    Pain Descriptors / Indicators Sore    Pain Radiating Towards into bilat glutes              [facility] PT Assessment - 08/01/20 0001      Assessment   Medical Diagnosis acute LBP with bil sciatica    Referring Provider (PT) Clearance Coots    Onset Date/Surgical Date 10/30/15    Hand Dominance Right    Next MD Visit to be scheduled.       Balance   Balance Assessed Yes      Standardized Balance Assessment   Standardized Balance Assessment Five Times Sit to Stand;Berg Balance Test    Five times sit to stand comments  17.03 sec no hands      Berg Balance Test   Sit to Stand Able to stand without using hands and stabilize independently    Standing Unsupported Able to stand safely 2 minutes    Sitting with Back Unsupported but Feet Supported on Floor or Stool Able to sit safely and securely 2 minutes    Stand to Sit Sits safely with minimal use of hands    Transfers Able to transfer safely, minor use of hands  Standing Unsupported with Eyes Closed Able to stand 10 seconds with supervision    Standing Unsupported with Feet Together Able to place feet together independently and stand 1 minute safely    From Standing, Reach Forward with Outstretched Arm Can reach confidently >25 cm (10")    From Standing Position, Pick up Object from Floor Able to pick up shoe safely and easily    From Standing Position, Turn to Look Behind Over each Shoulder Looks behind from both sides and weight shifts well    Turn 360 Degrees Able to turn 360 degrees safely but slowly    Standing Unsupported, Alternately Place Feet on Step/Stool Able to stand independently and safely and complete 8 steps in 20 seconds    Standing Unsupported, One Foot in Front Able to plae foot ahead of the other independently and hold 30 seconds    Standing on One Leg Able to lift leg independently and hold 5-10 seconds    Total Score 51    Berg comment: 51/56             [facility]  Adult PT Treatment/Exercise - 08/01/20 0001      Lumbar Exercises: Stretches   Passive Hamstring Stretch Right;Left;2 reps;30 seconds   seated with straight back   Piriformis Stretch Right;Left;30 seconds;3 reps    modified pigeon pose on table   Gastroc Stretch --   verbally reviewd     Lumbar Exercises: Aerobic   Nustep L5: arms/legs x 5 min       Lumbar Exercises: Standing   Row Both;10 reps;Theraband    Theraband Level (Row) Level 3 (Green)    Row Limitations simulating pulling pt up in bed with drawsheet     Other Standing Lumbar Exercises Anti-rotation with side lunge x 10 reps each side - green band ( to simlate pulling pt up in bed)     Other Standing Lumbar Exercises partial squat with/without 10# kettle bell, simulating assisting pt up from seated on EOB x 8 reps each side in staggered stance (weight on table) with cues for core activation,  head and spiine in neutral, scaps retracted and glutes activated.                     PT Short Term Goals - 07/19/20 1213      PT SHORT TERM GOAL #1   Title Independent and compliant with initial HEP    Time 2    Period Weeks    Status New    Target Date 08/02/20      PT SHORT TERM GOAL #2   Title Balance assessment completed (BERG/5x sit to stand) and goals set accordingly)    Time 2    Period Weeks    Status New    Target Date 08/02/20             PT Long Term Goals - 07/19/20 1214      PT LONG TERM GOAL #1   Title Improve core stability with patient to demonstrate improve sitting posture and alignment    Time 8    Period Weeks    Status New    Target Date 09/13/20      PT LONG TERM GOAL #2   Title Patient to report increased sitting tolerance to 2 hours    Time 8    Period Weeks    Status New    Target Date 09/13/20      PT LONG TERM GOAL #3  Title Patient to report 50-75% decreased pain in the back with functional activities including sitting; standing and walking    Time 8    Period Weeks     Status New      PT LONG TERM GOAL #4   Title Independent in HEP    Time 8    Period Weeks    Status New      PT LONG TERM GOAL #5   Title Improve FOTO to </=40% limitation    Time 6    Period Weeks    Status New      Additional Long Term Goals   Additional Long Term Goals Yes      PT LONG TERM GOAL #6   Title Patient able to squat and return to standing without UE assist    Time 8    Period Weeks    Status New                 Plan - 08/01/20 2105    Clinical Impression Statement Pt scored 51/56 on Berg, however she may benefit from further evaluation of dynamic gait assessment. Pt has decreased balance with tandem stance with LLE in back, and SLS. Body mechanics with work-related patient transfers and bed mobility was reviewed. Initially pt felt discomfort in low back with transfer simulation, but with cues on hinged hip/core engaged she reported less back pain and more glute activation. Pt would benefit from futher back care education, specifically with body mechanics for assisting other pt's in acute care setting.    Personal Factors and Comorbidities Comorbidity 3+;Fitness;Time since onset of injury/illness/exacerbation    Comorbidities Osteoporosis, stenosis, L3/4 disc right, migraines, anxiety/depression, PTSD ([place]ov[place]), insomnia    Examination-Activity Limitations Bend;Sit;Lift    Stability/Clinical Decision Making Evolving/Moderate complexity    Rehab Potential Good    PT Frequency 2x / week    PT Duration 8 weeks    PT Treatment/Interventions ADLs/Self Care Home Management;Aquatic Therapy;Cryotherapy;Electrical Stimulation;Iontophoresis 4mg /ml Dexamethasone;Moist Heat;Neuromuscular re-education;Balance training;Therapeutic exercise;Therapeutic activities;Patient/family education;Manual techniques;Dry needling;Taping    PT Next Visit Plan Set balance/ 5x STS goals; assess DGI/functional gait. Begin squat progression with good mechanics. Continue back care  education and postural strengthening. Check STGs   PT Home Exercise Plan [email or web address]    Consulted and Agree with Plan of Care Patient           Patient will benefit from skilled therapeutic intervention in order to improve the following deficits and impairments:  Decreased range of motion, Pain, Increased muscle spasms, Decreased activity tolerance, Decreased balance, Decreased strength, Impaired flexibility  Visit Diagnosis: Chronic bilateral low back pain, unspecified whether sciatica present  Unsteadiness on feet  Muscle weakness (generalized)     Problem List Patient Active Problem List   Diagnosis Date Noted  . Acute bilateral low back pain with bilateral sciatica 07/13/2020  . Gastric polyp 02/11/2019  . Sessile colonic polyp 02/11/2019  . Chronic fatigue 02/11/2019  . Grade I internal hemorrhoids 02/11/2019  . Ductal hyperplasia of breast 08/21/2018  . Cognitive changes 06/24/2018  . Dyslipidemia 06/24/2018  . Dense breast tissue on mammogram 04/17/2018  . Fibrocystic breast determined by biopsy 04/09/2018  . Family history of breast cancer in mother 04/09/2018  . Paroxysmal sinus tachycardia ([place]) 03/27/2018  . Osteoporosis of femur without pathological fracture 08/28/2017  . Facet arthropathy, lumbosacral 08/28/2017  . Sacrococcygeal pain 08/27/2017  . Left lumbar radiculopathy 08/27/2017  . Allergic rhinitis 08/09/2017  . Disorder of lipoid metabolism 08/09/2017  .  Insomnia secondary to anxiety 08/09/2017  . Postmenopausal osteoporosis 08/09/2017  . History of hysterectomy for benign disease 08/09/2017  . GAD (generalized anxiety disorder) 08/09/2017  . Former smoker, stopped smoking in distant past 08/09/2017  . Minor depressive disorder 08/09/2017  . Palpitations 03/16/2015  . Family history of colon cancer 03/02/2015  . Hiatal hernia with GERD without esophagitis 03/02/2015  . History of adenomatous polyp of colon 03/02/2015  . Chronic migraine 10/10/2014    . History of cardiovascular disorder 01/18/2003   Kerin Perna, PTA 08/01/20 6:00PM [facility] [place], [place], [address] Phone: [phone]   Fax:  [phone]  Name: Alice Williams MRN: [id number] Date of Birth: 01-Oct-1961

## 2020-08-04 ENCOUNTER — Ambulatory Visit (INDEPENDENT_AMBULATORY_CARE_PROVIDER_SITE_OTHER): Payer: 59 | Admitting: Physical Therapy

## 2020-08-04 ENCOUNTER — Other Ambulatory Visit: Payer: Self-pay

## 2020-08-04 ENCOUNTER — Encounter: Payer: Self-pay | Admitting: Physical Therapy

## 2020-08-04 DIAGNOSIS — G8929 Other chronic pain: Secondary | ICD-10-CM

## 2020-08-04 DIAGNOSIS — R2681 Unsteadiness on feet: Secondary | ICD-10-CM | POA: Diagnosis not present

## 2020-08-04 DIAGNOSIS — M6281 Muscle weakness (generalized): Secondary | ICD-10-CM

## 2020-08-04 DIAGNOSIS — M545 Low back pain, unspecified: Secondary | ICD-10-CM

## 2020-08-04 NOTE — Patient Instructions (Signed)
Access Code: [email or web address] URL: [email or web address] Date: 08/04/2020 Prepared by: Almyra Free  Exercises Supine Bridge - 2 x daily - 7 x weekly - 1-3 sets - 10 reps - 2-3 sec hold Supine Lower Trunk Rotation - 2 x daily - 7 x weekly - 1 sets - 5 reps - 10 sec hold Seated Hamstring Stretch - 2 x daily - 7 x weekly - 3 reps - 1 sets - 30-60 sec hold Gastroc Stretch on Wall - 2 x daily - 7 x weekly - 3 reps - 1 sets - 30-60 sec hold Sidelying Hip Abduction - 1 x daily - 7 x weekly - 1-2 sets - 10 reps Clam - 1 x daily - 7 x weekly - 1-2 sets - 10 reps Seated Table Piriformis Stretch - 2 x daily - 7 x weekly - 1 sets - 2-3 reps - 20-30 sec hold Seated Piriformis Stretch - 2 x daily - 7 x weekly - 1 sets - 2-3 reps - 20-30 sec hold Sidelying Thoracic Rotation with Open Book - 1 x daily - 7 x weekly - 1 sets - 3-5 reps - 5 sec hold Gastroc Stretch with Foot at Wall - 1 x daily - 7 x weekly - 1 sets - 3 reps - 30-60 sec hold Standing Hip Abduction with Counter Support - 1 x daily - 7 x weekly - 3 sets - 10 reps Standing Hip Extension with Counter Support - 1 x daily - 7 x weekly - 3 sets - 10 reps

## 2020-08-04 NOTE — Therapy (Addendum)
[place], [place], [address] Phone: [phone]   Fax:  [phone]  Physical Therapy Treatment  Patient Details  Name: Alice Williams MRN: [id number] Date of Birth: 1961/06/02 Referring Provider (PT): Clearance Coots   Encounter Date: 08/04/2020   PT End of Session - 08/04/20 0937    Visit Number 4    Date for PT Re-Evaluation 09/13/20    Authorization Type UMR    PT Start Time 0936    PT Stop Time 1017    PT Time Calculation (min) 41 min    Activity Tolerance Patient tolerated treatment well    Behavior During Therapy [facility] for tasks assessed/performed           Past Medical History:  Diagnosis Date  . Anal fissure   . Anxiety   . Breast calcifications on mammogram   . Colon polyp   . Dyslipidemia 06/24/2018  . Elevated cholesterol   . Facet arthropathy, lumbosacral 08/28/2017  . GERD (gastroesophageal reflux disease)   . History of colon polyps   . IBS (irritable bowel syndrome)   . Inappropriate sinus tachycardia   . Migraines   . Osteopenia   . Osteoporosis of femur without pathological fracture 08/28/2017  . Palpitations   . Premature surgical menopause     Past Surgical History:  Procedure Laterality Date  . ANAL FISSURE REPAIR    . APPENDECTOMY  2000  . COLONOSCOPY W/ POLYPECTOMY  03/2015  . COLONOSCOPY WITH ESOPHAGOGASTRODUODENOSCOPY (EGD)  04/01/2015  . KNEE SURGERY    . OOPHORECTOMY    . [place]  . PELVIC LAPAROSCOPY     x 4 for endometriosis   . VAGINAL HYSTERECTOMY  1994    There were no vitals filed for this visit.   Subjective Assessment - 08/04/20 0937    Subjective Patient reports she hurt her mid back yesterday lifting a patient 3-4/10 pain. Hurts if she really breathes in deep.    Pertinent History Osteoporosis, stenosis, L3/4 disc right, migraines, anxiety/depression, PTSD ([place]ovid/[place]), insomnia    Currently in Pain? Yes     Pain Score 2     Pain Location Hip    Pain Orientation Left    Pain Descriptors / Indicators Sore              [facility] PT Assessment - 08/04/20 0001      Balance   Balance Assessed Yes      Standardized Balance Assessment   Standardized Balance Assessment Dynamic Gait Index      Dynamic Gait Index   Level Surface Normal    Change in Gait Speed Normal    Gait with Horizontal Head Turns Mild Impairment   some deviation   Gait with Vertical Head Turns Normal    Gait and Pivot Turn Moderate Impairment   LOB   Step Over Obstacle Mild Impairment   slows so preferred foot goes over first   Step Around Obstacles Normal    Steps Normal    Total Score 20                         [facility] Adult PT Treatment/Exercise - 08/04/20 0001      Lumbar Exercises: Stretches   Gastroc Stretch Right;Left;1 rep;30 seconds    Gastroc Stretch Limitations on incline      Lumbar Exercises: Aerobic   Nustep L5: arms/legs x 5 min  Lumbar Exercises: Standing   Row 15 reps    Theraband Level (Row) Level 3 (Green)    Other Standing Lumbar Exercises Anti-rotation with side lunge x 10 reps each side - green band ( to simlate pulling pt up in bed) ; horizontal ABD green x 10; lawn mower pull x 10 green ea    Other Standing Lumbar Exercises Hip ABD/EXT/march x 10 ea bil; heel raises x 10               Balance Exercises - 08/04/20 0001      Balance Exercises: Standing   Tandem Stance Eyes open;Intermittent upper extremity support;5 reps;Limitations    Tandem Stance Time max hold using 2 finger support    Partial Tandem Stance Eyes open;3 reps;Other (comment)   bil; added some head turns with right foot in back            PT Education - 08/04/20 1027    Education Details hep for work progressed    [employer]) Educated Patient    Methods Explanation;Demonstration;Handout    Comprehension Verbalized understanding;Returned demonstration            PT Short Term Goals -  08/04/20 1040      PT SHORT TERM GOAL #1   Title Independent and compliant with initial HEP    Status Achieved      PT SHORT TERM GOAL #2   Title Balance assessment completed (BERG/5x sit to stand) and goals set accordingly)    Status Achieved             PT Long Term Goals - 08/04/20 1040      PT LONG TERM GOAL #1   Title Improve core stability with patient to demonstrate improve sitting posture and alignment    Status On-going      PT LONG TERM GOAL #2   Title Patient to report increased sitting tolerance to 2 hours    Status On-going      PT LONG TERM GOAL #3   Title Patient to report 50-75% decreased pain in the back with functional activities including sitting; standing and walking    Status On-going      PT LONG TERM GOAL #4   Title Independent in HEP    Status On-going      PT LONG TERM GOAL #5   Title Improve FOTO to </=40% limitation    Status On-going      Additional Long Term Goals   Additional Long Term Goals Yes      PT LONG TERM GOAL #6   Title Patient able to squat and return to standing without UE assist    Status On-going      PT LONG TERM GOAL #7   Title Pt to demo improved balance scores on the BERG to 54/56, 5x sit to stand to <= 12 sec  and DGI to 22/24 to reduce fall risk and improve ambulation safety.    Status New    Target Date 09/13/20      PT LONG TERM GOAL #8   Title Patient to report decreased LOB with ambulation at work by 50% or more.    Status New    Target Date 09/13/20                 Plan - 08/04/20 1027    Clinical Impression Statement Patient scored 20/24 on the DGI indicating some fall risk. Greatest LOB with pivot turn. She does demo bil Trendelenberg with  gait as well. She had some upper back pain due to lifting a patient yesterday, but pain resolved with TE. Patient will benefit from continued core and hip strengthening to address back/hip pain and balance issues.    Comorbidities Osteoporosis, stenosis, L3/4  disc right, migraines, anxiety/depression, PTSD ([place]ov[place]), insomnia    PT Treatment/Interventions ADLs/Self Care Home Management;Aquatic Therapy;Cryotherapy;Electrical Stimulation;Iontophoresis 4mg /ml Dexamethasone;Moist Heat;Neuromuscular re-education;Balance training;Therapeutic exercise;Therapeutic activities;Patient/family education;Manual techniques;Dry needling;Taping    PT Next Visit Plan MD note for appt 08/11/20. Continue squat progression with good mechanics. Hip/Core strength and balance. Continue back care education and postural strengthening.    PT Home Exercise Plan [phone]           Patient will benefit from skilled therapeutic intervention in order to improve the following deficits and impairments:  Decreased range of motion, Pain, Increased muscle spasms, Decreased activity tolerance, Decreased balance, Decreased strength, Impaired flexibility  Visit Diagnosis: Chronic bilateral low back pain, unspecified whether sciatica present  Unsteadiness on feet  Muscle weakness (generalized)     Problem List Patient Active Problem List   Diagnosis Date Noted  . Acute bilateral low back pain with bilateral sciatica 07/13/2020  . Gastric polyp 02/11/2019  . Sessile colonic polyp 02/11/2019  . Chronic fatigue 02/11/2019  . Grade I internal hemorrhoids 02/11/2019  . Ductal hyperplasia of breast 08/21/2018  . Cognitive changes 06/24/2018  . Dyslipidemia 06/24/2018  . Dense breast tissue on mammogram 04/17/2018  . Fibrocystic breast determined by biopsy 04/09/2018  . Family history of breast cancer in mother 04/09/2018  . Paroxysmal sinus tachycardia ([place]) 03/27/2018  . Osteoporosis of femur without pathological fracture 08/28/2017  . Facet arthropathy, lumbosacral 08/28/2017  . Sacrococcygeal pain 08/27/2017  . Left lumbar radiculopathy 08/27/2017  . Allergic rhinitis 08/09/2017  . Disorder of lipoid metabolism 08/09/2017  . Insomnia secondary to anxiety  08/09/2017  . Postmenopausal osteoporosis 08/09/2017  . History of hysterectomy for benign disease 08/09/2017  . GAD (generalized anxiety disorder) 08/09/2017  . Former smoker, stopped smoking in distant past 08/09/2017  . Minor depressive disorder 08/09/2017  . Palpitations 03/16/2015  . Family history of colon cancer 03/02/2015  . Hiatal hernia with GERD without esophagitis 03/02/2015  . History of adenomatous polyp of colon 03/02/2015  . Chronic migraine 10/10/2014  . History of cardiovascular disorder 01/18/2003    Madelyn Flavors PT 08/04/2020, 10:54 AM  [facility] [place], [place], [address] Phone: [phone]   Fax:  [phone]  Name: Alice Williams MRN: [id number] Date of Birth: 1961/04/22

## 2020-08-05 ENCOUNTER — Other Ambulatory Visit: Payer: Self-pay | Admitting: Physician Assistant

## 2020-08-05 MED ORDER — NURTEC 75 MG PO TBDP: 75.0000 mg | ORAL_TABLET | ORAL | 11 refills | Status: DC

## 2020-08-09 ENCOUNTER — Encounter: Payer: Self-pay | Admitting: Physical Therapy

## 2020-08-09 ENCOUNTER — Other Ambulatory Visit: Payer: Self-pay

## 2020-08-09 ENCOUNTER — Ambulatory Visit (INDEPENDENT_AMBULATORY_CARE_PROVIDER_SITE_OTHER): Payer: 59 | Admitting: Physical Therapy

## 2020-08-09 DIAGNOSIS — M6281 Muscle weakness (generalized): Secondary | ICD-10-CM | POA: Diagnosis not present

## 2020-08-09 DIAGNOSIS — R2681 Unsteadiness on feet: Secondary | ICD-10-CM

## 2020-08-09 DIAGNOSIS — G8929 Other chronic pain: Secondary | ICD-10-CM

## 2020-08-09 DIAGNOSIS — M545 Low back pain, unspecified: Secondary | ICD-10-CM

## 2020-08-09 NOTE — Therapy (Signed)
[place], [place], [address] Phone: [phone]   Fax:  [phone]  Physical Therapy Treatment  Patient Details  Name: Alice Williams MRN: [id number] Date of Birth: 01/03/1961 Referring Provider (PT): Clearance Coots   Encounter Date: 08/09/2020   PT End of Session - 08/09/20 0931    Visit Number 5    Date for PT Re-Evaluation 09/13/20    Authorization Type UMR    PT Start Time 0931    PT Stop Time 1015    PT Time Calculation (min) 44 min    Activity Tolerance Patient tolerated treatment well    Behavior During Therapy [facility] for tasks assessed/performed           Past Medical History:  Diagnosis Date  . Anal fissure   . Anxiety   . Breast calcifications on mammogram   . Colon polyp   . Dyslipidemia 06/24/2018  . Elevated cholesterol   . Facet arthropathy, lumbosacral 08/28/2017  . GERD (gastroesophageal reflux disease)   . History of colon polyps   . IBS (irritable bowel syndrome)   . Inappropriate sinus tachycardia   . Migraines   . Osteopenia   . Osteoporosis of femur without pathological fracture 08/28/2017  . Palpitations   . Premature surgical menopause     Past Surgical History:  Procedure Laterality Date  . ANAL FISSURE REPAIR    . APPENDECTOMY  2000  . COLONOSCOPY W/ POLYPECTOMY  03/2015  . COLONOSCOPY WITH ESOPHAGOGASTRODUODENOSCOPY (EGD)  04/01/2015  . KNEE SURGERY    . OOPHORECTOMY    . [place]  . PELVIC LAPAROSCOPY     x 4 for endometriosis   . VAGINAL HYSTERECTOMY  1994    There were no vitals filed for this visit.   Subjective Assessment - 08/09/20 0931    Subjective left hip pain    Pertinent History Osteoporosis, stenosis, L3/4 disc right, migraines, anxiety/depression, PTSD ([place]), insomnia    Limitations Sitting;Walking;Standing    How long can you sit comfortably? 15-20 min    Diagnostic tests CT of abdomen included  lumbar    Currently in Pain? Yes    Pain Score 4     Pain Location Hip    Pain Orientation Left    Pain Descriptors / Indicators Shooting                             [facility] Adult PT Treatment/Exercise - 08/09/20 0001      Self-Care   Self-Care ADL's    ADL's worked on return from squat and 1/2 kneel standing      Lumbar Exercises: Standing   Functional Squats 10 reps;20 reps   20 reps in small range at chair   Functional Squats Limitations Sumo squat x 10   bil UE support on stairs   Row 20 reps    Theraband Level (Row) Level 3 (Green)    Shoulder Extension 20 reps    Theraband Level (Shoulder Extension) Level 3 (Green)    Shoulder Extension Limitations standing on blue foam pad    Other Standing Lumbar Exercises Hip ABD/EXT/march x 10 ea bil; heel raises x 10      Lumbar Exercises: Seated   Hip Flexion on Ball Both;5 reps    Hip Flexion on Ball Limitations difficult to stabilize when lifting left leg      Lumbar Exercises: Prone  Single Arm Raise Right;Left;5 reps    Straight Leg Raise 10 reps    Straight Leg Raises Limitations each with pelvic press    Opposite Arm/Leg Raise Right arm/Left leg;Left arm/Right leg;5 reps      Lumbar Exercises: Quadruped   Straight Leg Raise 10 reps    Plank 2x 10 sec; modified side plank  bil 2x 10 sec                    PT Short Term Goals - 08/04/20 1040      PT SHORT TERM GOAL #1   Title Independent and compliant with initial HEP    Status Achieved      PT SHORT TERM GOAL #2   Title Balance assessment completed (BERG/5x sit to stand) and goals set accordingly)    Status Achieved             PT Long Term Goals - 08/09/20 1026      PT LONG TERM GOAL #1   Title Improve core stability with patient to demonstrate improve sitting posture and alignment    Status On-going      PT LONG TERM GOAL #2   Title Patient to report increased sitting tolerance to 2 hours    Status On-going      PT LONG  TERM GOAL #3   Title Patient to report 50-75% decreased pain in the back with functional activities including sitting; standing and walking    Status On-going      PT LONG TERM GOAL #4   Title Independent in HEP    Status Partially Met      PT LONG TERM GOAL #5   Title Improve FOTO to </=40% limitation    Status On-going      PT LONG TERM GOAL #6   Title Patient able to squat and return to standing without UE assist    Baseline able to stand from 1/2 kneel bil without difficulty    Status On-going      PT LONG TERM GOAL #7   Title Pt to demo improved balance scores on the BERG to 54/56, 5x sit to stand to <= 12 sec  and DGI to 22/24 to reduce fall risk and improve ambulation safety.    Status New      PT LONG TERM GOAL #8   Title Patient to report decreased LOB with ambulation at work by 50% or more.    Status New                 Plan - 08/09/20 1022    Clinical Impression Statement Patient is progressing toward her LTGs. Today she reports weakness again with standing from squat position at work. We worked on strengthening in small squat range from 90 -120 deg approximately and will progress to deeper squats as tolerated. We also worked on switching to 1/2 kneel to stand which patient was able to do without difficulty bil. She will benefit from continued hip/core strengthening on compliant surfaces to address balance deficits as well. She has difficulty with higher level balance activiities involving SLS, particularly on the left. She has weakness with side planks bil but generally demonstrated a strong core today with planks and quadriped activities.    Personal Factors and Comorbidities Comorbidity 3+;Fitness;Time since onset of injury/illness/exacerbation    Comorbidities Osteoporosis, stenosis, L3/4 disc right, migraines, anxiety/depression, PTSD ([place]), insomnia    PT Frequency 2x / week    PT Duration 8  weeks    PT Treatment/Interventions ADLs/Self Care  Home Management;Aquatic Therapy;Cryotherapy;Electrical Stimulation;Iontophoresis 41m/ml Dexamethasone;Moist Heat;Neuromuscular re-education;Balance training;Therapeutic exercise;Therapeutic activities;Patient/family education;Manual techniques;Dry needling;Taping    PT Next Visit Plan Continue squat progression (deeper) with good mechanics. Hip/Core strength and balance. Continue back care education and postural strengthening.    PT Home Exercise Plan [id number]   Consulted and Agree with Plan of Care Patient           Patient will benefit from skilled therapeutic intervention in order to improve the following deficits and impairments:  Decreased range of motion, Pain, Increased muscle spasms, Decreased activity tolerance, Decreased balance, Decreased strength, Impaired flexibility  Visit Diagnosis: Chronic bilateral low back pain, unspecified whether sciatica present  Unsteadiness on feet  Muscle weakness (generalized)     Problem List Patient Active Problem List   Diagnosis Date Noted  . Acute bilateral low back pain with bilateral sciatica 07/13/2020  . Gastric polyp 02/11/2019  . Sessile colonic polyp 02/11/2019  . Chronic fatigue 02/11/2019  . Grade I internal hemorrhoids 02/11/2019  . Ductal hyperplasia of breast 08/21/2018  . Cognitive changes 06/24/2018  . Dyslipidemia 06/24/2018  . Dense breast tissue on mammogram 04/17/2018  . Fibrocystic breast determined by biopsy 04/09/2018  . Family history of breast cancer in mother 04/09/2018  . [place] 03/27/2018  . Osteoporosis of femur without pathological fracture 08/28/2017  . Facet arthropathy, lumbosacral 08/28/2017  . Sacrococcygeal pain 08/27/2017  . Left lumbar radiculopathy 08/27/2017  . Allergic rhinitis 08/09/2017  . Disorder of lipoid metabolism 08/09/2017  . Insomnia secondary to anxiety 08/09/2017  . Postmenopausal osteoporosis 08/09/2017  . History of hysterectomy for benign  disease 08/09/2017  . GAD (generalized anxiety disorder) 08/09/2017  . Former smoker, stopped smoking in distant past 08/09/2017  . Minor depressive disorder 08/09/2017  . Palpitations 03/16/2015  . Family history of colon cancer 03/02/2015  . Hiatal hernia with GERD without esophagitis 03/02/2015  . History of adenomatous polyp of colon 03/02/2015  . Chronic migraine 10/10/2014  . History of cardiovascular disorder 01/18/2003    JMadelyn FlavorsPT 08/09/2020, 10:36 AM  [facility] [place] [address]Phone: [phone]  Fax:  [phone] Name: SLorisa ScheidMRN: [id number] of Birth: 102-03-1961

## 2020-08-11 ENCOUNTER — Encounter: Payer: Self-pay | Admitting: Physical Therapy

## 2020-08-11 ENCOUNTER — Ambulatory Visit: Payer: 59 | Admitting: Family Medicine

## 2020-08-11 ENCOUNTER — Ambulatory Visit (INDEPENDENT_AMBULATORY_CARE_PROVIDER_SITE_OTHER): Payer: 59 | Admitting: Physical Therapy

## 2020-08-11 ENCOUNTER — Other Ambulatory Visit: Payer: Self-pay

## 2020-08-11 DIAGNOSIS — M6281 Muscle weakness (generalized): Secondary | ICD-10-CM | POA: Diagnosis not present

## 2020-08-11 DIAGNOSIS — G8929 Other chronic pain: Secondary | ICD-10-CM

## 2020-08-11 DIAGNOSIS — M545 Low back pain, unspecified: Secondary | ICD-10-CM

## 2020-08-11 DIAGNOSIS — R2681 Unsteadiness on feet: Secondary | ICD-10-CM | POA: Diagnosis not present

## 2020-08-11 NOTE — Therapy (Signed)
[place], [place], [address] Phone: [phone]   Fax:  [phone]  Physical Therapy Treatment  Patient Details  Name: Alice Williams MRN: [id number] Date of Birth: 1961/02/17 Referring Provider (PT): Clearance Coots   Encounter Date: 08/11/2020   PT End of Session - 08/11/20 0933    Visit Number 6    Date for PT Re-Evaluation 09/13/20    Authorization Type UMR    PT Start Time 0933    PT Stop Time 1016    PT Time Calculation (min) 43 min    Activity Tolerance Patient tolerated treatment well    Behavior During Therapy [facility] for tasks assessed/performed           Past Medical History:  Diagnosis Date  . Anal fissure   . Anxiety   . Breast calcifications on mammogram   . Colon polyp   . Dyslipidemia 06/24/2018  . Elevated cholesterol   . Facet arthropathy, lumbosacral 08/28/2017  . GERD (gastroesophageal reflux disease)   . History of colon polyps   . IBS (irritable bowel syndrome)   . Inappropriate sinus tachycardia   . Migraines   . Osteopenia   . Osteoporosis of femur without pathological fracture 08/28/2017  . Palpitations   . Premature surgical menopause     Past Surgical History:  Procedure Laterality Date  . ANAL FISSURE REPAIR    . APPENDECTOMY  2000  . COLONOSCOPY W/ POLYPECTOMY  03/2015  . COLONOSCOPY WITH ESOPHAGOGASTRODUODENOSCOPY (EGD)  04/01/2015  . KNEE SURGERY    . OOPHORECTOMY    . [place]  . PELVIC LAPAROSCOPY     x 4 for endometriosis   . VAGINAL HYSTERECTOMY  1994    There were no vitals filed for this visit.   Subjective Assessment - 08/11/20 0933    Subjective both hips hurt today and it goes up into my back    Pertinent History Osteoporosis, stenosis, L3/4 disc right, migraines, anxiety/depression, PTSD ([place]ov[place]), insomnia    Limitations Sitting;Walking;Standing    Diagnostic tests CT of abdomen included lumbar     Currently in Pain? Yes    Pain Score 3     Pain Location Hip    Pain Orientation Left    Pain Descriptors / Indicators Shooting                             [facility] Adult PT Treatment/Exercise - 08/11/20 0001      Lumbar Exercises: Stretches   Figure 4 Stretch 2 reps;Seated;60 seconds    Gastroc Stretch Right;Left;1 rep;30 seconds    Gastroc Stretch Limitations on incline      Lumbar Exercises: Aerobic   Nustep L5: arms/legs x 5 min       Lumbar Exercises: Standing   Functional Squats 20 reps    Functional Squats Limitations from chair height and up 2 inches     Row Both;10 reps;20 reps    Theraband Level (Row) Level 3 (Green)   1st ten in tandem stance   Shoulder Extension 20 reps    Theraband Level (Shoulder Extension) Level 3 (Green)    Shoulder Extension Limitations standing on blue foam    Other Standing Lumbar Exercises anti rotation x 10 ea; split stance with yellow ball and rot x 10 ea    Other Standing Lumbar Exercises Step ups lateral with hip ABD, fwd with hip flex  on bosu x 10 ea; step up fwd on 6 inch step with opp hip ext x 10 ea               Balance Exercises - 08/11/20 0001      Balance Exercises: Standing   Standing Eyes Closed Foam/compliant surface;30 secs;Solid surface   a lot of sway but pt able to maintain balance on foam;   Tandem Stance Eyes open;Intermittent upper extremity support;5 reps    Tandem Stance Time bil with head turns    SLS Eyes open;Foam/compliant surface;Intermittent upper extremity support;30 secs    Tandem Gait Forward;2 reps               PT Short Term Goals - 08/04/20 1040      PT SHORT TERM GOAL #1   Title Independent and compliant with initial HEP    Status Achieved      PT SHORT TERM GOAL #2   Title Balance assessment completed (BERG/5x sit to stand) and goals set accordingly)    Status Achieved             PT Long Term Goals - 08/09/20 1026      PT LONG TERM GOAL #1   Title Improve  core stability with patient to demonstrate improve sitting posture and alignment    Status On-going      PT LONG TERM GOAL #2   Title Patient to report increased sitting tolerance to 2 hours    Status On-going      PT LONG TERM GOAL #3   Title Patient to report 50-75% decreased pain in the back with functional activities including sitting; standing and walking    Status On-going      PT LONG TERM GOAL #4   Title Independent in HEP    Status Partially Met      PT LONG TERM GOAL #5   Title Improve FOTO to </=40% limitation    Status On-going      PT LONG TERM GOAL #6   Title Patient able to squat and return to standing without UE assist    Baseline able to stand from 1/2 kneel bil without difficulty    Status On-going      PT LONG TERM GOAL #7   Title Pt to demo improved balance scores on the BERG to 54/56, 5x sit to stand to <= 12 sec  and DGI to 22/24 to reduce fall risk and improve ambulation safety.    Status New      PT LONG TERM GOAL #8   Title Patient to report decreased LOB with ambulation at work by 50% or more.    Status New                 Plan - 08/11/20 1146    Clinical Impression Statement Patient did well today with TE. Squats are progressing. Greatest challenge is semi tandem stance with head turns.    Personal Factors and Comorbidities Comorbidity 3+;Fitness;Time since onset of injury/illness/exacerbation    Comorbidities Osteoporosis, stenosis, L3/4 disc right, migraines, anxiety/depression, PTSD ([place]ov[place]), insomnia    PT Frequency 2x / week    PT Duration 8 weeks    PT Treatment/Interventions ADLs/Self Care Home Management;Aquatic Therapy;Cryotherapy;Electrical Stimulation;Iontophoresis 50m/ml Dexamethasone;Moist Heat;Neuromuscular re-education;Balance training;Therapeutic exercise;Therapeutic activities;Patient/family education;Manual techniques;Dry needling;Taping    PT Next Visit Plan Continue squat progression (deeper) with good  mechanics. Hip/Core strength and balance. Continue back care education and postural strengthening.    PT Home  Exercise Plan [id number]           Patient will benefit from skilled therapeutic intervention in order to improve the following deficits and impairments:  Decreased range of motion, Pain, Increased muscle spasms, Decreased activity tolerance, Decreased balance, Decreased strength, Impaired flexibility  Visit Diagnosis: Chronic bilateral low back pain, unspecified whether sciatica present  Unsteadiness on feet  Muscle weakness (generalized)     Problem List Patient Active Problem List   Diagnosis Date Noted  . Acute bilateral low back pain with bilateral sciatica 07/13/2020  . Gastric polyp 02/11/2019  . Sessile colonic polyp 02/11/2019  . Chronic fatigue 02/11/2019  . Grade I internal hemorrhoids 02/11/2019  . Ductal hyperplasia of breast 08/21/2018  . Cognitive changes 06/24/2018  . Dyslipidemia 06/24/2018  . Dense breast tissue on mammogram 04/17/2018  . Fibrocystic breast determined by biopsy 04/09/2018  . Family history of breast cancer in mother 04/09/2018  . Paroxysmal sinus tachycardia ([place]) 03/27/2018  . Osteoporosis of femur without pathological fracture 08/28/2017  . Facet arthropathy, lumbosacral 08/28/2017  . Sacrococcygeal pain 08/27/2017  . Left lumbar radiculopathy 08/27/2017  . Allergic rhinitis 08/09/2017  . Disorder of lipoid metabolism 08/09/2017  . Insomnia secondary to anxiety 08/09/2017  . Postmenopausal osteoporosis 08/09/2017  . History of hysterectomy for benign disease 08/09/2017  . GAD (generalized anxiety disorder) 08/09/2017  . Former smoker, stopped smoking in distant past 08/09/2017  . Minor depressive disorder 08/09/2017  . Palpitations 03/16/2015  . Family history of colon cancer 03/02/2015  . Hiatal hernia with GERD without esophagitis 03/02/2015  . History of adenomatous polyp of colon 03/02/2015  . Chronic migraine 10/10/2014   . History of cardiovascular disorder 01/18/2003    Madelyn Flavors PT 08/11/2020, 1:05 PM  [facility] [place], [place], [address] Phone: [phone]   Fax:  [phone]  Name: Alice Williams MRN: [id number] Date of Birth: 01-10-1961

## 2020-08-15 ENCOUNTER — Encounter: Payer: Self-pay | Admitting: Family Medicine

## 2020-08-15 ENCOUNTER — Ambulatory Visit: Payer: Self-pay

## 2020-08-15 ENCOUNTER — Other Ambulatory Visit: Payer: Self-pay

## 2020-08-15 ENCOUNTER — Ambulatory Visit: Payer: 59 | Admitting: Family Medicine

## 2020-08-15 VITALS — BP 111/71 | HR 60 | Ht 68.0 in | Wt 146.0 lb

## 2020-08-15 DIAGNOSIS — M533 Sacrococcygeal disorders, not elsewhere classified: Secondary | ICD-10-CM

## 2020-08-15 DIAGNOSIS — M816 Localized osteoporosis [Lequesne]: Secondary | ICD-10-CM

## 2020-08-15 DIAGNOSIS — G8929 Other chronic pain: Secondary | ICD-10-CM | POA: Diagnosis not present

## 2020-08-15 DIAGNOSIS — E559 Vitamin D deficiency, unspecified: Secondary | ICD-10-CM | POA: Insufficient documentation

## 2020-08-15 MED ORDER — TRIAMCINOLONE ACETONIDE 40 MG/ML IJ SUSP
40.0000 mg | Freq: Once | INTRAMUSCULAR | Status: AC
  Administered 2020-08-15: 40 mg via INTRA_ARTICULAR

## 2020-08-15 NOTE — Patient Instructions (Signed)
Good to see you Please try heat  Please try the exercises  I will call with the results from today  They should call to schedule the bone density   Please send me a message in MyChart with any questions or updates.  Please see me back in 4 weeks.   --Dr. Raeford Razor

## 2020-08-15 NOTE — Assessment & Plan Note (Signed)
Has history of osteoporosis. -Check vitamin D.

## 2020-08-15 NOTE — Assessment & Plan Note (Signed)
Pain seems to be associated with the SI joint.  Could have some contribution from osteoporosis. -SI joint pain. -Counseled on home exercise therapy and supportive care.  - can continue physical therapy.

## 2020-08-15 NOTE — Progress Notes (Signed)
Alice Williams - 59 y.o. female MRN [id number]  Date of birth: 07/26/1961  SUBJECTIVE:  Including CC & ROS.  Chief Complaint  Patient presents with  . Follow-up    bilateral low back    Alice Williams is a 59 y.o. female that is presenting with worsening of her low back pain.  Has had some improvement with her stability with physical therapy.  She does not have history of osteoporosis and had reflux with previous treatment.   Review of Systems See HPI   HISTORY: Past Medical, Surgical, Social, and Family History Reviewed & Updated per EMR.   Pertinent Historical Findings include:  Past Medical History:  Diagnosis Date  . Anal fissure   . Anxiety   . Breast calcifications on mammogram   . Colon polyp   . Dyslipidemia 06/24/2018  . Elevated cholesterol   . Facet arthropathy, lumbosacral 08/28/2017  . GERD (gastroesophageal reflux disease)   . History of colon polyps   . IBS (irritable bowel syndrome)   . Inappropriate sinus tachycardia   . Migraines   . Osteopenia   . Osteoporosis of femur without pathological fracture 08/28/2017  . Palpitations   . Premature surgical menopause     Past Surgical History:  Procedure Laterality Date  . ANAL FISSURE REPAIR    . APPENDECTOMY  2000  . COLONOSCOPY W/ POLYPECTOMY  03/2015  . COLONOSCOPY WITH ESOPHAGOGASTRODUODENOSCOPY (EGD)  04/01/2015  . KNEE SURGERY    . OOPHORECTOMY    . [place]  . PELVIC LAPAROSCOPY     x 4 for endometriosis   . VAGINAL HYSTERECTOMY  1994    Family History  Problem Relation Age of Onset  . Colon cancer Mother   . Breast cancer Mother 50       and liver  . Hyperlipidemia Father   . Alcohol abuse Father   . Heart attack Father   . Cancer Father        Head and neck   . Hyperlipidemia Brother   . Lung cancer Maternal Grandmother   . Parkinson's disease Maternal Grandfather   . Dementia Maternal Grandfather   . Prostate cancer Maternal Grandfather   . Parkinson's disease  Paternal Grandmother   . Heart attack Paternal Grandfather   . Kidney cancer Sister 67  . Esophageal cancer Neg Hx     Social History   Socioeconomic History  . Marital status: Divorced    Spouse name: Not on file  . Number of children: 1  . Years of education: Not on file  . Highest education level: Master's degree (e.g., MA, MS, MEng, MEd, MSW, MBA)  Occupational History  . Occupation: Therapist, sports  Tobacco Use  . Smoking status: Former Smoker    Quit date: 08/09/2006    Years since quitting: 14.0  . Smokeless tobacco: Never Used  Vaping Use  . Vaping Use: Never used  Substance and Sexual Activity  . Alcohol use: Not Currently    Comment: rare  . Drug use: No  . Sexual activity: Never    Birth control/protection: Surgical  Other Topics Concern  . Not on file  Social History Narrative   Single, livevs alone in a 2 story house. Drinks one soda a day. Does not exercise regularly.   Social Determinants of Health   Financial Resource Strain:   . Difficulty of Paying Living Expenses: Not on file  Food Insecurity:   . Worried About Charity fundraiser in the Last  Year: Not on file  . Ran Out of Food in the Last Year: Not on file  Transportation Needs:   . Lack of Transportation (Medical): Not on file  . Lack of Transportation (Non-Medical): Not on file  Physical Activity:   . Days of Exercise per Week: Not on file  . Minutes of Exercise per Session: Not on file  Stress:   . Feeling of Stress : Not on file  Social Connections:   . Frequency of Communication with Friends and Family: Not on file  . Frequency of Social Gatherings with Friends and Family: Not on file  . Attends Religious Services: Not on file  . Active Member of Clubs or Organizations: Not on file  . Attends Archivist Meetings: Not on file  . Marital Status: Not on file  Intimate Partner Violence:   . Fear of Current or Ex-Partner: Not on file  . Emotionally Abused: Not on file  . Physically Abused:  Not on file  . Sexually Abused: Not on file     PHYSICAL EXAM:  VS: BP 111/71   Pulse 60   Ht 5\' 8"  (1.727 m)   Wt 146 lb (66.2 kg)   BMI 22.20 kg/m  Physical Exam Gen: NAD, alert, cooperative with exam, well-appearing MSK:  Back: Tenderness palpation of the SI joints. No redness or swelling. Normal strength with hip flexion. Negative straight leg raise. Neurovascularly intact   Aspiration/Injection Procedure Note Alice Williams 1961/05/23  Procedure: Injection Indications: Left SI joint pain  Procedure Details Consent: Risks of procedure as well as the alternatives and risks of each were explained to the (patient/caregiver).  Consent for procedure obtained. Time Out: Verified patient identification, verified procedure, site/side was marked, verified correct patient position, special equipment/implants available, medications/allergies/relevent history reviewed, required imaging and test results available.  Performed.  The area was cleaned with iodine and alcohol swabs.    The left SI joint was injected using 5 cc 1% lidocaine on a 22-gauge 3-1/2 inch needle.  The syringe was switched to mixture containing 1 cc's of 40 mg Kenalog and 4 cc's of 0.25% bupivacaine was injected.  The SI joint was visualized and the injectate was placed visually.  Ultrasound was used. Images were obtained in long views showing the injection.     A sterile dressing was applied.  Patient did tolerate procedure well.     ASSESSMENT & PLAN:   Osteoporosis Bone density from 2018 was showing osteoporosis. -Can consider Prolia. -Bone density.   Chronic left SI joint pain Pain seems to be associated with the SI joint.  Could have some contribution from osteoporosis. -SI joint pain. -Counseled on home exercise therapy and supportive care.  - can continue physical therapy.  Vitamin D deficiency Has history of osteoporosis. -Check vitamin D.

## 2020-08-15 NOTE — Assessment & Plan Note (Signed)
Bone density from 2018 was showing osteoporosis. -Can consider Prolia. -Bone density.

## 2020-08-16 ENCOUNTER — Ambulatory Visit (INDEPENDENT_AMBULATORY_CARE_PROVIDER_SITE_OTHER): Payer: 59 | Admitting: Physical Therapy

## 2020-08-16 ENCOUNTER — Other Ambulatory Visit: Payer: Self-pay | Admitting: Family Medicine

## 2020-08-16 ENCOUNTER — Telehealth: Payer: Self-pay | Admitting: Family Medicine

## 2020-08-16 DIAGNOSIS — G8929 Other chronic pain: Secondary | ICD-10-CM

## 2020-08-16 DIAGNOSIS — M6281 Muscle weakness (generalized): Secondary | ICD-10-CM

## 2020-08-16 DIAGNOSIS — R2681 Unsteadiness on feet: Secondary | ICD-10-CM | POA: Diagnosis not present

## 2020-08-16 DIAGNOSIS — M545 Low back pain, unspecified: Secondary | ICD-10-CM

## 2020-08-16 LAB — VITAMIN D 25 HYDROXY (VIT D DEFICIENCY, FRACTURES): Vit D, 25-Hydroxy: 19.7 ng/mL — ABNORMAL LOW (ref 30.0–100.0)

## 2020-08-16 MED ORDER — VITAMIN D (ERGOCALCIFEROL) 1.25 MG (50000 UNIT) PO CAPS: 50000.0000 [IU] | ORAL_CAPSULE | ORAL | 0 refills | Status: DC

## 2020-08-16 MED FILL — VIT D2 1.25 MG (50,000 UNIT: 1.25 MG | 56 days supply | Qty: 8 | Fill #0

## 2020-08-16 NOTE — Telephone Encounter (Signed)
Left VM for patient. If she calls back please have her speak with a nurse/CMA and inform that her vitamin D is low and I have sent in a prescription.   If any questions then please take the best time and phone number to call and I will try to call her back.   Rosemarie Ax, MD [facility] 08/16/2020, 8:31 AM

## 2020-08-16 NOTE — Therapy (Addendum)
[place], [place], [address] Phone: [phone]   Fax:  [phone]  Physical Therapy Treatment and Discharge Summary  Patient Details  Name: Alice Williams MRN: [id number] Date of Birth: 04/18/1961 Referring Provider (PT): Clearance Coots   Encounter Date: 08/16/2020   PT End of Session - 08/16/20 1156    Visit Number 7    Date for PT Re-Evaluation 09/13/20    Authorization Type UMR    PT Start Time 1150    PT Stop Time 1230    PT Time Calculation (min) 40 min    Activity Tolerance Patient tolerated treatment well    Behavior During Therapy [facility] for tasks assessed/performed           Past Medical History:  Diagnosis Date  . Anal fissure   . Anxiety   . Breast calcifications on mammogram   . Colon polyp   . Dyslipidemia 06/24/2018  . Elevated cholesterol   . Facet arthropathy, lumbosacral 08/28/2017  . GERD (gastroesophageal reflux disease)   . History of colon polyps   . IBS (irritable bowel syndrome)   . Inappropriate sinus tachycardia   . Migraines   . Osteopenia   . Osteoporosis of femur without pathological fracture 08/28/2017  . Palpitations   . Premature surgical menopause     Past Surgical History:  Procedure Laterality Date  . ANAL FISSURE REPAIR    . APPENDECTOMY  2000  . COLONOSCOPY W/ POLYPECTOMY  03/2015  . COLONOSCOPY WITH ESOPHAGOGASTRODUODENOSCOPY (EGD)  04/01/2015  . KNEE SURGERY    . OOPHORECTOMY    . [place]  . PELVIC LAPAROSCOPY     x 4 for endometriosis   . VAGINAL HYSTERECTOMY  1994    There were no vitals filed for this visit.   Subjective Assessment - 08/16/20 1157    Subjective Pt reports she had injection in Lt SI joint yesterday, "It feels numb in there today".  She "feels it" now in her Rt SI and hip. She feels her balance has improved. She voices concern about her osteoporosis   Pertinent History Osteoporosis, stenosis, L3/4  disc right, migraines, anxiety/depression, PTSD ([place]ovid/[place]), insomnia    Currently in Pain? Yes    Pain Score 3     Pain Location Hip    Pain Orientation Right    Pain Descriptors / Indicators Aching    Aggravating Factors  laying on Rt side; prolonged walking    Pain Relieving Factors repositioning              [facility] PT Assessment - 08/16/20 0001      Assessment   Medical Diagnosis acute LBP with bil sciatica    Referring Provider (PT) Clearance Coots    Onset Date/Surgical Date 10/30/15    Hand Dominance Right    Next MD Visit to be scheduled.       Observation/Other Assessments   Focus on Therapeutic Outcomes (FOTO)  46% limitation; goal of 40% limitation           [facility] Adult PT Treatment/Exercise - 08/16/20 0001      Lumbar Exercises: Stretches   Passive Hamstring Stretch Right;Left;2 reps;20 seconds    Lower Trunk Rotation 1 rep;10 seconds    Lower Trunk Rotation Limitations Pain with Lt rotation; encouraged pt to only perform this exercise in pain free range.     Piriformis Stretch Right;Left;1 rep;20 seconds    Other Lumbar Stretch Exercise  butterfly stretch in supine x 30 sec       Lumbar Exercises: Aerobic   Nustep L5: arms/legs x 5 min       Lumbar Exercises: Standing   Functional Squats 10 reps   2 sets, from black mat and white bolster on mat   Functional Squats Limitations small range, as practiced in previous sessions.     Row Both;15 reps;Theraband    Theraband Level (Row) Level 3 (Green)    Other Standing Lumbar Exercises hip abdct and hip ext with 2.5# on ankles x 10 each.  Tandem stance each foot forward x 20 sec without UE support.     Other Standing Lumbar Exercises lateral step up with 2.5# on ankles x 10 each side, 6" step.       Lumbar Exercises: Supine   Bridge 10 reps;3 seconds                  PT Education - 08/16/20 1251    Education Details Pt issued green band.  verbally reviewed existing HEP    Person(s) Educated  Patient    Methods Explanation    Comprehension Verbalized understanding            PT Short Term Goals - 08/04/20 1040      PT SHORT TERM GOAL #1   Title Independent and compliant with initial HEP    Status Achieved      PT SHORT TERM GOAL #2   Title Balance assessment completed (BERG/5x sit to stand) and goals set accordingly)    Status Achieved             PT Long Term Goals - 08/16/20 1205      PT LONG TERM GOAL #1   Title Improve core stability with patient to demonstrate improve sitting posture and alignment    Status On-going      PT LONG TERM GOAL #2   Title Patient to report increased sitting tolerance to 2 hours    Status On-going      PT LONG TERM GOAL #3   Title Patient to report 50-75% decreased pain in the back with functional activities including sitting; standing and walking    Baseline pt reports no change in pain since starting therapy    Status On-going      PT LONG TERM GOAL #4   Title Independent in HEP    Status Partially Met      PT LONG TERM GOAL #5   Title Improve FOTO to </=40% limitation    Status On-going      PT LONG TERM GOAL #6   Title Patient able to squat and return to standing without UE assist    Baseline able to stand from 1/2 kneel bil without difficulty    Status On-going      PT LONG TERM GOAL #7   Title Pt to demo improved balance scores on the BERG to 54/56, 5x sit to stand to <= 12 sec  and DGI to 22/24 to reduce fall risk and improve ambulation safety.    Status On-going      PT LONG TERM GOAL #8   Title Patient to report decreased LOB with ambulation at work by 50% or more.    Status On-going                 Plan - 08/16/20 1246    Clinical Impression Statement Pt reported some increase in discomfort across low back after completion  of bridges and Lt lumbar rotation stretch.  All other exercises were tolerated well.  Trialed standing exercises with 2.5# wts since pt has these at home.  Limited progress  towards LTGs at this time.  Pt requests to hold therapy while she continues to work on exercises at home.    Personal Factors and Comorbidities Comorbidity 3+;Fitness;Time since onset of injury/illness/exacerbation    Comorbidities Osteoporosis, stenosis, L3/4 disc right, migraines, anxiety/depression, PTSD ([place]), insomnia    PT Frequency 2x / week    PT Duration 8 weeks    PT Treatment/Interventions ADLs/Self Care Home Management;Aquatic Therapy;Cryotherapy;Electrical Stimulation;Iontophoresis 18m/ml Dexamethasone;Moist Heat;Neuromuscular re-education;Balance training;Therapeutic exercise;Therapeutic activities;Patient/family education;Manual techniques;Dry needling;Taping    PT Next Visit Plan will hold per pt, until 09/15/20. Will d/c if pt doesn't return before then.    PT Home Exercise Plan [id number]   Consulted and Agree with Plan of Care Patient           Patient will benefit from skilled therapeutic intervention in order to improve the following deficits and impairments:  Decreased range of motion, Pain, Increased muscle spasms, Decreased activity tolerance, Decreased balance, Decreased strength, Impaired flexibility  Visit Diagnosis: Chronic bilateral low back pain, unspecified whether sciatica present  Unsteadiness on feet  Muscle weakness (generalized)     Problem List Patient Active Problem List   Diagnosis Date Noted  . Vitamin D deficiency 08/15/2020  . Acute bilateral low back pain with bilateral sciatica 07/13/2020  . Gastric polyp 02/11/2019  . Sessile colonic polyp 02/11/2019  . Chronic fatigue 02/11/2019  . Grade I internal hemorrhoids 02/11/2019  . Ductal hyperplasia of breast 08/21/2018  . Cognitive changes 06/24/2018  . Dyslipidemia 06/24/2018  . Dense breast tissue on mammogram 04/17/2018  . Fibrocystic breast determined by biopsy 04/09/2018  . Family history of breast cancer in mother 04/09/2018  . Paroxysmal sinus tachycardia ([place]  03/27/2018  . Osteoporosis 08/28/2017  . Facet arthropathy, lumbosacral 08/28/2017  . Chronic left SI joint pain 08/27/2017  . Left lumbar radiculopathy 08/27/2017  . Allergic rhinitis 08/09/2017  . Disorder of lipoid metabolism 08/09/2017  . Insomnia secondary to anxiety 08/09/2017  . Postmenopausal osteoporosis 08/09/2017  . History of hysterectomy for benign disease 08/09/2017  . GAD (generalized anxiety disorder) 08/09/2017  . Former smoker, stopped smoking in distant past 08/09/2017  . Minor depressive disorder 08/09/2017  . Palpitations 03/16/2015  . Family history of colon cancer 03/02/2015  . Hiatal hernia with GERD without esophagitis 03/02/2015  . History of adenomatous polyp of colon 03/02/2015  . Chronic migraine 10/10/2014  . History of cardiovascular disorder 01/18/2003   JKerin Perna PTA 08/16/20 12:52 PM  [place] [address]Phone: [phone]  Fax:  [phone] Name: Alice FairMRN: [id number] of Birth: 127-Sep-1962 PHYSICAL THERAPY DISCHARGE SUMMARY  Visits from Start of Care: 7  Current functional level related to goals / functional outcomes: unknown   Remaining deficits: unknown   Education / Equipment: HEP  Plan: Patient agrees to discharge.  Patient goals were not met. Patient is being discharged due to not returning since the last visit.  ?????     JMadelyn Flavors PT 10/04/20 8:06 AM  [facility] [id number] [phone](office) [phone](fax)

## 2020-08-22 ENCOUNTER — Encounter: Payer: Self-pay | Admitting: Osteopathic Medicine

## 2020-08-22 DIAGNOSIS — Z1239 Encounter for other screening for malignant neoplasm of breast: Secondary | ICD-10-CM

## 2020-08-24 ENCOUNTER — Other Ambulatory Visit: Payer: Self-pay

## 2020-08-24 ENCOUNTER — Ambulatory Visit (INDEPENDENT_AMBULATORY_CARE_PROVIDER_SITE_OTHER): Payer: 59

## 2020-08-24 DIAGNOSIS — E2839 Other primary ovarian failure: Secondary | ICD-10-CM | POA: Diagnosis not present

## 2020-08-24 DIAGNOSIS — Z87828 Personal history of other (healed) physical injury and trauma: Secondary | ICD-10-CM | POA: Diagnosis not present

## 2020-08-24 DIAGNOSIS — M816 Localized osteoporosis [Lequesne]: Secondary | ICD-10-CM | POA: Diagnosis not present

## 2020-08-24 DIAGNOSIS — Z8739 Personal history of other diseases of the musculoskeletal system and connective tissue: Secondary | ICD-10-CM | POA: Diagnosis not present

## 2020-08-24 DIAGNOSIS — Z13820 Encounter for screening for osteoporosis: Secondary | ICD-10-CM | POA: Diagnosis not present

## 2020-08-24 DIAGNOSIS — Z780 Asymptomatic menopausal state: Secondary | ICD-10-CM | POA: Diagnosis not present

## 2020-08-25 ENCOUNTER — Telehealth: Payer: Self-pay | Admitting: Family Medicine

## 2020-08-25 NOTE — Telephone Encounter (Signed)
Left VM for patient. If she calls back please have her speak with a nurse/CMA and inform that her bone density is showing osteoporosis. We can start a medication such as prolia which is an injectable given every 81months.   If any questions then please take the best time and phone number to call and I will try to call her back.   Rosemarie Ax, MD [facility] 08/25/2020, 9:40 AM

## 2020-08-30 ENCOUNTER — Encounter: Payer: Self-pay | Admitting: Family Medicine

## 2020-09-02 MED FILL — GABAPENTIN 100 MG CAPSULE: 100 | 20 days supply | Qty: 60 | Fill #1

## 2020-09-02 MED FILL — PANTOPRAZOLE SOD DR 40 MG T: 40 | 90 days supply | Qty: 90 | Fill #1

## 2020-09-12 ENCOUNTER — Ambulatory Visit: Payer: 59 | Admitting: Family Medicine

## 2020-09-15 ENCOUNTER — Other Ambulatory Visit: Payer: Self-pay | Admitting: Family Medicine

## 2020-09-15 ENCOUNTER — Other Ambulatory Visit: Payer: Self-pay

## 2020-09-15 ENCOUNTER — Ambulatory Visit (HOSPITAL_BASED_OUTPATIENT_CLINIC_OR_DEPARTMENT_OTHER)
Admission: RE | Admit: 2020-09-15 | Discharge: 2020-09-15 | Disposition: A | Discharge status: Home / Self Care | Payer: 59 | Source: Ambulatory Visit | Attending: Family Medicine | Admitting: Family Medicine

## 2020-09-15 ENCOUNTER — Ambulatory Visit: Payer: 59 | Admitting: Family Medicine

## 2020-09-15 VITALS — BP 106/62 | Ht 67.0 in | Wt 142.0 lb

## 2020-09-15 DIAGNOSIS — M545 Low back pain, unspecified: Secondary | ICD-10-CM | POA: Diagnosis not present

## 2020-09-15 DIAGNOSIS — M533 Sacrococcygeal disorders, not elsewhere classified: Secondary | ICD-10-CM | POA: Insufficient documentation

## 2020-09-15 DIAGNOSIS — M25552 Pain in left hip: Secondary | ICD-10-CM | POA: Diagnosis not present

## 2020-09-15 DIAGNOSIS — G8929 Other chronic pain: Secondary | ICD-10-CM

## 2020-09-15 DIAGNOSIS — M816 Localized osteoporosis [Lequesne]: Secondary | ICD-10-CM | POA: Diagnosis not present

## 2020-09-15 DIAGNOSIS — M5442 Lumbago with sciatica, left side: Secondary | ICD-10-CM

## 2020-09-15 DIAGNOSIS — M5441 Lumbago with sciatica, right side: Secondary | ICD-10-CM | POA: Diagnosis not present

## 2020-09-15 DIAGNOSIS — Z9889 Other specified postprocedural states: Secondary | ICD-10-CM | POA: Diagnosis not present

## 2020-09-15 DIAGNOSIS — M47818 Spondylosis without myelopathy or radiculopathy, sacral and sacrococcygeal region: Secondary | ICD-10-CM | POA: Diagnosis not present

## 2020-09-15 NOTE — Assessment & Plan Note (Signed)
Getting improvement with physical therapy.  Pain may be associated with osteoporosis. -Counseled on home exercise therapy and supportive care. -X-ray. -Continue physical therapy. -Could consider further imaging if needed.

## 2020-09-15 NOTE — Progress Notes (Signed)
Alice Williams - 59 y.o. female MRN [id number]  Date of birth: 1961-09-08  SUBJECTIVE:  Including CC & ROS.  No chief complaint on file.   Alice Williams is a 59 y.o. female that is following up for back pain and SI joint pain.  She is having improvement in her stability and strength with physical therapy.  She still gets lateral hip pain from time to time.  She also gets some mid back pain intermittently.  She did get improvement with SI joint injection on the left side..    Review of Systems See HPI   HISTORY: Past Medical, Surgical, Social, and Family History Reviewed & Updated per EMR.   Pertinent Historical Findings include:  Past Medical History:  Diagnosis Date  . Anal fissure   . Anxiety   . Breast calcifications on mammogram   . Colon polyp   . Dyslipidemia 06/24/2018  . Elevated cholesterol   . Facet arthropathy, lumbosacral 08/28/2017  . GERD (gastroesophageal reflux disease)   . History of colon polyps   . IBS (irritable bowel syndrome)   . Inappropriate sinus tachycardia   . Migraines   . Osteopenia   . Osteoporosis of femur without pathological fracture 08/28/2017  . Palpitations   . Premature surgical menopause     Past Surgical History:  Procedure Laterality Date  . ANAL FISSURE REPAIR    . APPENDECTOMY  2000  . COLONOSCOPY W/ POLYPECTOMY  03/2015  . COLONOSCOPY WITH ESOPHAGOGASTRODUODENOSCOPY (EGD)  04/01/2015  . KNEE SURGERY    . OOPHORECTOMY    . [place]  . PELVIC LAPAROSCOPY     x 4 for endometriosis   . VAGINAL HYSTERECTOMY  1994    Family History  Problem Relation Age of Onset  . Colon cancer Mother   . Breast cancer Mother 63       and liver  . Hyperlipidemia Father   . Alcohol abuse Father   . Heart attack Father   . Cancer Father        Head and neck   . Hyperlipidemia Brother   . Lung cancer Maternal Grandmother   . Parkinson's disease Maternal Grandfather   . Dementia Maternal Grandfather   . Prostate cancer  Maternal Grandfather   . Parkinson's disease Paternal Grandmother   . Heart attack Paternal Grandfather   . Kidney cancer Sister 76  . Esophageal cancer Neg Hx     Social History   Socioeconomic History  . Marital status: Divorced    Spouse name: Not on file  . Number of children: 1  . Years of education: Not on file  . Highest education level: Master's degree (e.g., MA, MS, MEng, MEd, MSW, MBA)  Occupational History  . Occupation: Therapist, sports  Tobacco Use  . Smoking status: Former Smoker    Quit date: 08/09/2006    Years since quitting: 14.1  . Smokeless tobacco: Never Used  Vaping Use  . Vaping Use: Never used  Substance and Sexual Activity  . Alcohol use: Not Currently    Comment: rare  . Drug use: No  . Sexual activity: Never    Birth control/protection: Surgical  Other Topics Concern  . Not on file  Social History Narrative   Single, livevs alone in a 2 story house. Drinks one soda a day. Does not exercise regularly.   Social Determinants of Health   Financial Resource Strain:   . Difficulty of Paying Living Expenses: Not on file  Food Insecurity:   .  Worried About Charity fundraiser in the Last Year: Not on file  . Ran Out of Food in the Last Year: Not on file  Transportation Needs:   . Lack of Transportation (Medical): Not on file  . Lack of Transportation (Non-Medical): Not on file  Physical Activity:   . Days of Exercise per Week: Not on file  . Minutes of Exercise per Session: Not on file  Stress:   . Feeling of Stress : Not on file  Social Connections:   . Frequency of Communication with Friends and Family: Not on file  . Frequency of Social Gatherings with Friends and Family: Not on file  . Attends Religious Services: Not on file  . Active Member of Clubs or Organizations: Not on file  . Attends Archivist Meetings: Not on file  . Marital Status: Not on file  Intimate Partner Violence:   . Fear of Current or Ex-Partner: Not on file  .  Emotionally Abused: Not on file  . Physically Abused: Not on file  . Sexually Abused: Not on file     PHYSICAL EXAM:  VS: BP 106/62   Ht 5\' 7"  (1.702 m)   Wt 142 lb (64.4 kg)   BMI 22.24 kg/m  Physical Exam Gen: NAD, alert, cooperative with exam, well-appearing   ASSESSMENT & PLAN:   Acute bilateral low back pain with bilateral sciatica Getting improvement with physical therapy.  Pain may be associated with osteoporosis. -Counseled on home exercise therapy and supportive care. -X-ray. -Continue physical therapy. -Could consider further imaging if needed.  Chronic left SI joint pain Did get improvement with the SI joint injection. -Counseled on home exercise therapy and supportive care -Right and left hip x-ray. -Can consider injection if needed.  Osteoporosis Most recent bone density was revealing for osteoporosis. -We will initiate Prolia.

## 2020-09-15 NOTE — Assessment & Plan Note (Signed)
Did get improvement with the SI joint injection. -Counseled on home exercise therapy and supportive care -Right and left hip x-ray. -Can consider injection if needed.

## 2020-09-15 NOTE — Assessment & Plan Note (Signed)
Most recent bone density was revealing for osteoporosis. -We will initiate Prolia.

## 2020-09-15 NOTE — Patient Instructions (Signed)
Good to see you Please continue the exercises  I'll call with the results from today  We'll call when the prolia is in   Please send me a message in MyChart with any questions or updates.  Please see me back in 6-8 weeks.   --Dr. Raeford Razor

## 2020-09-19 ENCOUNTER — Other Ambulatory Visit: Payer: Self-pay | Admitting: Osteopathic Medicine

## 2020-09-19 ENCOUNTER — Telehealth: Payer: Self-pay | Admitting: Family Medicine

## 2020-09-19 DIAGNOSIS — Z1231 Encounter for screening mammogram for malignant neoplasm of breast: Secondary | ICD-10-CM

## 2020-09-19 NOTE — Telephone Encounter (Signed)
Informed of results.   Rosemarie Ax, MD [facility] 09/19/2020, 8:42 AM

## 2020-09-20 ENCOUNTER — Other Ambulatory Visit: Payer: Self-pay

## 2020-09-20 ENCOUNTER — Ambulatory Visit
Admission: RE | Admit: 2020-09-20 | Discharge: 2020-09-20 | Disposition: A | Discharge status: Home / Self Care | Payer: 59 | Source: Ambulatory Visit | Attending: Osteopathic Medicine | Admitting: Osteopathic Medicine

## 2020-09-20 DIAGNOSIS — Z1231 Encounter for screening mammogram for malignant neoplasm of breast: Secondary | ICD-10-CM | POA: Diagnosis not present

## 2020-09-28 ENCOUNTER — Other Ambulatory Visit: Payer: Self-pay | Admitting: *Deleted

## 2020-09-28 MED ORDER — PROLIA 60 MG/ML ~~LOC~~ SOSY: 60.0000 mg | PREFILLED_SYRINGE | SUBCUTANEOUS | 1 refills | Status: DC

## 2020-10-04 MED FILL — DICYCLOMINE 10 MG CAPSULE: 10 | 30 days supply | Qty: 60 | Fill #2

## 2020-10-04 MED FILL — PROLIA 60 MG/ML SOLN: 60 | 90 days supply | Qty: 1 | Fill #0

## 2020-10-04 MED FILL — BOTOX 200 UNITS VIAL: 200 | 60 days supply | Qty: 1 | Fill #0

## 2020-10-04 MED FILL — TROKENDI XR 200 MG CAPSULE: 200 | 30 days supply | Qty: 30 | Fill #6

## 2020-10-10 ENCOUNTER — Other Ambulatory Visit: Payer: Self-pay

## 2020-10-10 ENCOUNTER — Ambulatory Visit: Payer: 59 | Admitting: Family Medicine

## 2020-10-10 ENCOUNTER — Encounter: Payer: Self-pay | Admitting: Family Medicine

## 2020-10-10 DIAGNOSIS — M816 Localized osteoporosis [Lequesne]: Secondary | ICD-10-CM

## 2020-10-10 NOTE — Assessment & Plan Note (Signed)
Prolia injection today.  - f/u in 6 months.

## 2020-10-10 NOTE — Progress Notes (Signed)
  Tameko Halder - 59 y.o. female MRN [id number]  Date of birth: [birth year]  SUBJECTIVE:  Including CC & ROS.  No chief complaint on file.   Sorayah Schrodt is a 59 y.o. female that is here for prolia injection.     ASSESSMENT & PLAN:   Osteoporosis Prolia injection today.  - f/u in 6 months.

## 2020-10-14 ENCOUNTER — Ambulatory Visit: Payer: 59 | Admitting: Physician Assistant

## 2020-10-14 ENCOUNTER — Other Ambulatory Visit: Payer: Self-pay

## 2020-10-14 ENCOUNTER — Encounter: Payer: Self-pay | Admitting: Physician Assistant

## 2020-10-14 VITALS — BP 107/74 | HR 77

## 2020-10-14 DIAGNOSIS — G43709 Chronic migraine without aura, not intractable, without status migrainosus: Secondary | ICD-10-CM

## 2020-10-14 DIAGNOSIS — M62838 Other muscle spasm: Secondary | ICD-10-CM

## 2020-10-14 MED ORDER — ONABOTULINUMTOXINA 100 UNITS IJ SOLR
200.0000 [IU] | Freq: Once | INTRAMUSCULAR | Status: AC
Start: 2020-10-14 — End: 2020-10-14
  Administered 2020-10-14: 200 [IU] via INTRAMUSCULAR

## 2020-10-14 MED FILL — NADOLOL 40 MG TABS: 40 | 90 days supply | Qty: 90 | Fill #1

## 2020-10-14 NOTE — Progress Notes (Signed)
S: Pt in office today for Botox injections. Her Botox has been working very well for migraine prevention. Although she continues to have significant neck spasm that contributes to headache pain.  She requests trial of trigger point injections today   O: BP 107/74   Pulse 77    Botox Procedure Note Vial of Botox was :Supplied by Patient 200 unit vial   Botox Dosing by [facility] [facility] for Chronic Migraine   Injection Sites for Migraines  Botox 155 units was injected using the dosage in the table above in the pattern shown above. 45 wasted units  PROCEDURE:  TRIGGER POINT INJECTIONS  Procedure: Mixture of 1%  Lidocaine, marcaine and dexamethazone in a ratio of 2:2:1  injected with 1cc each site in bilateral  bilateral cervical paraspinal muscles, bilateral trapezius (all at different locations than the botox).  Total amount injected: 8cc.  Pt tolerated procedure well.    A: Migraine  Muscle spasm   P: Botox 155 units injected today.  TPI done today - well tolerated RTC 3 Months.

## 2020-11-08 ENCOUNTER — Other Ambulatory Visit: Payer: Self-pay | Admitting: Medical

## 2020-11-08 ENCOUNTER — Other Ambulatory Visit: Payer: Self-pay

## 2020-11-08 ENCOUNTER — Ambulatory Visit (INDEPENDENT_AMBULATORY_CARE_PROVIDER_SITE_OTHER): Payer: No Typology Code available for payment source | Admitting: Medical

## 2020-11-08 VITALS — BP 122/69 | HR 65 | Temp 97.9°F | Resp 18 | Ht 67.0 in | Wt 148.2 lb

## 2020-11-08 DIAGNOSIS — E785 Hyperlipidemia, unspecified: Secondary | ICD-10-CM

## 2020-11-08 DIAGNOSIS — R5382 Chronic fatigue, unspecified: Secondary | ICD-10-CM

## 2020-11-08 DIAGNOSIS — F3289 Other specified depressive episodes: Secondary | ICD-10-CM

## 2020-11-08 DIAGNOSIS — Z0000 Encounter for general adult medical examination without abnormal findings: Secondary | ICD-10-CM

## 2020-11-08 DIAGNOSIS — K2100 Gastro-esophageal reflux disease with esophagitis, without bleeding: Secondary | ICD-10-CM | POA: Diagnosis not present

## 2020-11-08 DIAGNOSIS — G43809 Other migraine, not intractable, without status migrainosus: Secondary | ICD-10-CM

## 2020-11-08 DIAGNOSIS — I471 Supraventricular tachycardia: Secondary | ICD-10-CM

## 2020-11-08 DIAGNOSIS — Z23 Encounter for immunization: Secondary | ICD-10-CM | POA: Diagnosis not present

## 2020-11-08 DIAGNOSIS — F5105 Insomnia due to other mental disorder: Secondary | ICD-10-CM

## 2020-11-08 DIAGNOSIS — F419 Anxiety disorder, unspecified: Secondary | ICD-10-CM

## 2020-11-08 DIAGNOSIS — M5442 Lumbago with sciatica, left side: Secondary | ICD-10-CM

## 2020-11-08 DIAGNOSIS — R7989 Other specified abnormal findings of blood chemistry: Secondary | ICD-10-CM

## 2020-11-08 DIAGNOSIS — F329 Major depressive disorder, single episode, unspecified: Secondary | ICD-10-CM

## 2020-11-08 DIAGNOSIS — M5441 Lumbago with sciatica, right side: Secondary | ICD-10-CM

## 2020-11-08 LAB — VITAMIN B12: Vitamin B-12: 353 pg/mL (ref 211–911)

## 2020-11-08 LAB — CBC WITH DIFFERENTIAL/PLATELET
Basophils Absolute: 0 10*3/uL (ref 0.0–0.1)
Basophils Relative: 0.8 % (ref 0.0–3.0)
Eosinophils Absolute: 0.2 10*3/uL (ref 0.0–0.7)
Eosinophils Relative: 3.2 % (ref 0.0–5.0)
HCT: 40 % (ref 36.0–46.0)
Hemoglobin: 13.6 g/dL (ref 12.0–15.0)
Lymphocytes Relative: 26.7 % (ref 12.0–46.0)
Lymphs Abs: 1.5 10*3/uL (ref 0.7–4.0)
MCHC: 34 g/dL (ref 30.0–36.0)
MCV: 89 fl (ref 78.0–100.0)
Monocytes Absolute: 0.4 10*3/uL (ref 0.1–1.0)
Monocytes Relative: 6.7 % (ref 3.0–12.0)
Neutro Abs: 3.6 10*3/uL (ref 1.4–7.7)
Neutrophils Relative %: 62.6 % (ref 43.0–77.0)
Platelets: 203 10*3/uL (ref 150.0–400.0)
RBC: 4.49 Mil/uL (ref 3.87–5.11)
RDW: 13.4 % (ref 11.5–15.5)
WBC: 5.7 10*3/uL (ref 4.0–10.5)

## 2020-11-08 LAB — COMPREHENSIVE METABOLIC PANEL
ALT: 19 U/L (ref 0–35)
AST: 17 U/L (ref 0–37)
Albumin: 4.2 g/dL (ref 3.5–5.2)
Alkaline Phosphatase: 88 U/L (ref 39–117)
BUN: 17 mg/dL (ref 6–23)
CO2: 23 mEq/L (ref 19–32)
Calcium: 8.8 mg/dL (ref 8.4–10.5)
Chloride: 112 mEq/L (ref 96–112)
Creatinine, Ser: 0.81 mg/dL (ref 0.40–1.20)
GFR: 79.71 mL/min (ref >60.00)
Glucose, Bld: 87 mg/dL (ref 70–99)
Potassium: 3.8 mEq/L (ref 3.5–5.1)
Sodium: 141 mEq/L (ref 135–145)
Total Bilirubin: 0.5 mg/dL (ref 0.2–1.2)
Total Protein: 6.2 g/dL (ref 6.0–8.3)

## 2020-11-08 LAB — LIPID PANEL
Cholesterol: 223 mg/dL — ABNORMAL HIGH (ref 0–200)
HDL: 51.7 mg/dL (ref >39.00)
LDL Cholesterol: 156 mg/dL — ABNORMAL HIGH (ref 0–99)
NonHDL: 171.16
Total CHOL/HDL Ratio: 4
Triglycerides: 76 mg/dL (ref 0.0–149.0)
VLDL: 15.2 mg/dL (ref 0.0–40.0)

## 2020-11-08 MED ORDER — TRAZODONE HCL 50 MG PO TABS
25.0000 mg | ORAL_TABLET | Freq: Every evening | ORAL | 3 refills | Status: DC | PRN
Start: 2020-11-08 — End: 2020-11-08

## 2020-11-08 MED ORDER — ESCITALOPRAM OXALATE 10 MG PO TABS: 10.0000 mg | ORAL_TABLET | Freq: Every day | ORAL | 1 refills | Status: DC

## 2020-11-08 MED FILL — ESCITALOPRAM 10 MG TABLET: 10 | 90 days supply | Qty: 90 | Fill #0

## 2020-11-08 MED FILL — traZODone HCL 50 MG TABS: 50 | 30 days supply | Qty: 30 | Fill #0

## 2020-11-08 NOTE — Addendum Note (Signed)
Addended by: Kem Boroughs D on: 11/08/2020 11:32 AM   Modules accepted: Orders

## 2020-11-08 NOTE — Patient Instructions (Addendum)
For you wellness exam today I have ordered cbc, cmp and lipid panel.  Flu vaccine and covid vaccine up to date.  Recommend exercise and healthy diet.  We will let you know lab results as they come in.  Follow up date appointment will be determined after lab review.   For gerd continue protonix.  For depression will refill lexapro.  For migraine ha hx continue current meds and placed referral to neurologist Dr. Jaynee Eagles.  For insomnia rx low dose trazadone.(rx advisement given see education information)  For tachcardia continue nadolol.   For fatigue will add b12, b1 and recheck vit D(hx of low vit D)  For high cholesterol history follow lipid panel. Zetia is non statin option in future if needed.    Preventive Care 13-22 Years Old, Female Preventive care refers to lifestyle choices and visits with your health care provider that can promote health and wellness. This includes:  A yearly physical exam. This is also called an annual wellness visit.  Regular dental and eye exams.  Immunizations.  Screening for certain conditions.  Healthy lifestyle choices, such as: ? Eating a healthy diet. ? Getting regular exercise. ? Not using drugs or products that contain nicotine and tobacco. ? Limiting alcohol use. What can I expect for my preventive care visit? Physical exam Your health care provider will check your:  Height and weight. These may be used to calculate your BMI (body mass index). BMI is a measurement that tells if you are at a healthy weight.  Heart rate and blood pressure.  Body temperature.  Skin for abnormal spots. Counseling Your health care provider may ask you questions about your:  Past medical problems.  Family's medical history.  Alcohol, tobacco, and drug use.  Emotional well-being.  Home life and relationship well-being.  Sexual activity.  Diet, exercise, and sleep habits.  Work and work Statistician.  Access to firearms.  Method of  birth control.  Menstrual cycle.  Pregnancy history. What immunizations do I need? Vaccines are usually given at various ages, according to a schedule. Your health care provider will recommend vaccines for you based on your age, medical history, and lifestyle or other factors, such as travel or where you work.   What tests do I need? Blood tests  Lipid and cholesterol levels. These may be checked every 5 years, or more often if you are over 83 years old.  Hepatitis C test.  Hepatitis B test. Screening  Lung cancer screening. You may have this screening every year starting at age 85 if you have a 30-pack-year history of smoking and currently smoke or have quit within the past 15 years.  Colorectal cancer screening. ? All adults should have this screening starting at age 78 and continuing until age 73. ? Your health care provider may recommend screening at age 78 if you are at increased risk. ? You will have tests every 1-10 years, depending on your results and the type of screening test.  Diabetes screening. ? This is done by checking your blood sugar (glucose) after you have not eaten for a while (fasting). ? You may have this done every 1-3 years.  Mammogram. ? This may be done every 1-2 years. ? Talk with your health care provider about when you should start having regular mammograms. This may depend on whether you have a family history of breast cancer.  BRCA-related cancer screening. This may be done if you have a family history of breast, ovarian, tubal, or peritoneal cancers.  Pelvic exam and Pap test. ? This may be done every 3 years starting at age 53. ? Starting at age 61, this may be done every 5 years if you have a Pap test in combination with an HPV test. Other tests  STD (sexually transmitted disease) testing, if you are at risk.  Bone density scan. This is done to screen for osteoporosis. You may have this scan if you are at high risk for osteoporosis. Talk  with your health care provider about your test results, treatment options, and if necessary, the need for more tests. Follow these instructions at home: Eating and drinking  Eat a diet that includes fresh fruits and vegetables, whole grains, lean protein, and low-fat dairy products.  Take vitamin and mineral supplements as recommended by your health care provider.  Do not drink alcohol if: ? Your health care provider tells you not to drink. ? You are pregnant, may be pregnant, or are planning to become pregnant.  If you drink alcohol: ? Limit how much you have to 0-1 drink a day. ? Be aware of how much alcohol is in your drink. In the [place] one drink equals one 12 oz bottle of beer (355 mL), one 5 oz glass of wine (148 mL), or one 1 oz glass of hard liquor (44 mL).   Lifestyle  Take daily care of your teeth and gums. Brush your teeth every morning and night with fluoride toothpaste. Floss one time each day.  Stay active. Exercise for at least 30 minutes 5 or more days each week.  Do not use any products that contain nicotine or tobacco, such as cigarettes, e-cigarettes, and chewing tobacco. If you need help quitting, ask your health care provider.  Do not use drugs.  If you are sexually active, practice safe sex. Use a condom or other form of protection to prevent STIs (sexually transmitted infections).  If you do not wish to become pregnant, use a form of birth control. If you plan to become pregnant, see your health care provider for a prepregnancy visit.  If told by your health care provider, take low-dose aspirin daily starting at age 54.  Find healthy ways to cope with stress, such as: ? Meditation, yoga, or listening to music. ? Journaling. ? Talking to a trusted person. ? Spending time with friends and family. Safety  Always wear your seat belt while driving or riding in a vehicle.  Do not drive: ? If you have been drinking alcohol. Do not ride with someone who has  been drinking. ? When you are tired or distracted. ? While texting.  Wear a helmet and other protective equipment during sports activities.  If you have firearms in your house, make sure you follow all gun safety procedures. What's next?  Visit your health care provider once a year for an annual wellness visit.  Ask your health care provider how often you should have your eyes and teeth checked.  Stay up to date on all vaccines. This information is not intended to replace advice given to you by your health care provider. Make sure you discuss any questions you have with your health care provider. Document Revised: 07/19/2020 Document Reviewed: 06/26/2018 Elsevier Patient Education  2021 [place].    Serotonin Syndrome Serotonin is a chemical in your body (neurotransmitter) that helps to control several functions, such as:  Brain and nerve cell function.  Mood and emotions.  Memory.  Eating.  Sleeping.  Sexual activity.  Stress response. Having  too much serotonin in your body can cause serotonin syndrome. This condition can be harmful to your brain and nerve cells. This can be a life-threatening condition. What are the causes? This condition may be caused by taking medicines or drugs that increase the level of serotonin in your body, such as:  Antidepressant medicines.  Migraine medicines.  Certain pain medicines.  Certain drugs, including ecstasy, LSD, cocaine, and amphetamines.  Over-the-counter cough or cold medicines that contain dextromethorphan.  Certain herbal supplements, including St. John's wort, ginseng, and nutmeg. This condition usually occurs when you take these medicines or drugs in combination, but it can also happen with a high dose of a single medicine or drug. What increases the risk? You are more likely to develop this condition if:  You just started taking a medicine or drug that increases the level of serotonin in the body.  You  recently increased the dose of a medicine or drug that increases the level of serotonin in the body.  You take more than one medicine or drug that increases the level of serotonin in the body. What are the signs or symptoms? Symptoms of this condition usually start within several hours of taking a medicine or drug. Symptoms may be mild or severe. Mild symptoms include:  Sweating.  Restlessness or agitation.  Muscle twitching or stiffness.  Rapid heart rate.  Nausea and vomiting.  Diarrhea.  Headache.  Shivering or goose bumps.  Confusion. Severe symptoms include:  Irregular heartbeat.  Seizures.  Loss of consciousness.  High fever. How is this diagnosed? This condition may be diagnosed based on:  Your medical history.  A physical exam.  Your prior use of drugs and medicines.  Blood or urine tests. These may be used to rule out other causes of your symptoms. How is this treated? The treatment for this condition depends on the severity of your symptoms.  For mild cases, stopping the medicine or drug that caused your condition is usually all that is needed.  For moderate to severe cases, treatment in a hospital may be needed to prevent or manage life-threatening symptoms. This may include medicines to control your symptoms, IV fluids, interventions to support your breathing, and treatments to control your body temperature. Follow these instructions at home: Medicines  Take over-the-counter and prescription medicines only as told by your health care provider. This is important.  Check with your health care provider before you start taking any new prescriptions, over-the-counter medicines, herbs, or supplements.  Avoid combining any medicines that can cause this condition to occur.   Lifestyle  Maintain a healthy lifestyle. ? Eat a healthy diet that includes plenty of vegetables, fruits, whole grains, low-fat dairy products, and lean protein. Do not eat a lot of  foods that are high in fat, added sugars, or salt. ? Get the right amount and quality of sleep. Most adults need 7-9 hours of sleep each night. ? Make time to exercise, even if it is only for short periods of time. Most adults should exercise for at least 150 minutes each week. ? Do not drink alcohol. ? Do not use illegal drugs, and do not take medicines for reasons other than they are prescribed.   General instructions  Do not use any products that contain nicotine or tobacco, such as cigarettes and e-cigarettes. If you need help quitting, ask your health care provider.  Keep all follow-up visits as told by your health care provider. This is important. Contact a health care provider if:  Your symptoms do not improve or they get worse. Get help right away if you:  Have worsening confusion, severe headache, chest pain, high fever, seizures, or loss of consciousness.  Experience serious side effects of medicine, such as swelling of your face, lips, tongue, or throat.  Have serious thoughts about hurting yourself or others. These symptoms may represent a serious problem that is an emergency. Do not wait to see if the symptoms will go away. Get medical help right away. Call your local emergency services (911 in the [place] Do not drive yourself to the hospital. If you ever feel like you may hurt yourself or others, or have thoughts about taking your own life, get help right away. You can go to your nearest emergency department or call:  Your local emergency services (911 in the [place]  A suicide crisis helpline, such as the [facility] at 7075044190. This is open 24 hours a day. Summary  Serotonin is a brain chemical that helps to regulate the nervous system. High levels of serotonin in the body can cause serotonin syndrome, which is a very dangerous condition.  This condition may be caused by taking medicines or drugs that increase the level of serotonin in  your body.  Treatment depends on the severity of your symptoms. For mild cases, stopping the medicine or drug that caused your condition is usually all that is needed.  Check with your health care provider before you start taking any new prescriptions, over-the-counter medicines, herbs, or supplements. This information is not intended to replace advice given to you by your health care provider. Make sure you discuss any questions you have with your health care provider. Document Revised: 11/22/2017 Document Reviewed: 11/22/2017 Elsevier Patient Education  2021 [employer].

## 2020-11-08 NOTE — Progress Notes (Signed)
Subjective:    Patient ID: Alice Williams, female    DOB: 19-Dec-1960, 60 y.o.   MRN: [id number]  HPI Pt here for first time.   Pt works for [employer]. Pt does not exercise on regular basis. Pt indicates diet not the base. Drinks 1 pepsi a day. Non smoker.   Hx of sinus tachycardia. Pt is on nadolol and controlled most of times.  Hx of migraines. Pt states in past was hard to find meds that have controlled headaches. She had seen neurology with [facility] in the past.  Botox in the past has not helped. Pt willing to see Dr. Jaynee Eagles.   Hx of some insomnia. Has had insomnia her whole life per her report Failed various meds. Pt never tried trazadone. Pt works days. She has been working overtime past year and half.  Pt has hx of depression. She is on lexapro. She ran out 2 months ago.   Hx of gerd. She is on protonix. States needs or else will have severe reflux. Has hx of egd in past.     Review of Systems  Constitutional: Negative for appetite change, diaphoresis, fatigue and fever.  HENT: Negative for congestion, ear discharge and ear pain.   Respiratory: Negative for cough, chest tightness, shortness of breath and wheezing.   Cardiovascular: Negative for chest pain and palpitations.  Gastrointestinal: Negative for abdominal pain, blood in stool, diarrhea and nausea.  Genitourinary: Negative for dysuria and frequency.  Musculoskeletal: Negative for back pain, joint swelling and neck pain.  Skin: Negative for rash.  Neurological: Negative for dizziness, seizures, syncope and headaches.  Hematological: Negative for adenopathy. Does not bruise/bleed easily.  Psychiatric/Behavioral: Negative for behavioral problems and confusion. The patient is not nervous/anxious.     Past Medical History:  Diagnosis Date  . Anal fissure   . Anxiety   . Breast calcifications on mammogram   . Colon polyp   . Dyslipidemia 06/24/2018  . Elevated cholesterol   . Facet arthropathy,  lumbosacral 08/28/2017  . GERD (gastroesophageal reflux disease)   . History of colon polyps   . IBS (irritable bowel syndrome)   . Inappropriate sinus tachycardia   . Migraines   . Osteopenia   . Osteoporosis of femur without pathological fracture 08/28/2017  . Palpitations   . Premature surgical menopause      Social History   Socioeconomic History  . Marital status: Divorced    Spouse name: Not on file  . Number of children: 1  . Years of education: Not on file  . Highest education level: Master's degree (e.g., MA, MS, MEng, MEd, MSW, MBA)  Occupational History  . Occupation: Therapist, sports  Tobacco Use  . Smoking status: Former Smoker    Quit date: 08/09/2006    Years since quitting: 14.2  . Smokeless tobacco: Never Used  Vaping Use  . Vaping Use: Never used  Substance and Sexual Activity  . Alcohol use: Not Currently    Comment: rare  . Drug use: No  . Sexual activity: Never    Birth control/protection: Surgical  Other Topics Concern  . Not on file  Social History Narrative   Single, livevs alone in a 2 story house. Drinks one soda a day. Does not exercise regularly.   Social Determinants of Health   Financial Resource Strain: Not on file  Food Insecurity: Not on file  Transportation Needs: Not on file  Physical Activity: Not on file  Stress: Not on file  Social  Connections: Not on file  Intimate Partner Violence: Not on file    Past Surgical History:  Procedure Laterality Date  . ANAL FISSURE REPAIR    . APPENDECTOMY  2000  . COLONOSCOPY W/ POLYPECTOMY  03/2015  . COLONOSCOPY WITH ESOPHAGOGASTRODUODENOSCOPY (EGD)  04/01/2015  . KNEE SURGERY    . OOPHORECTOMY    . [place]  . PELVIC LAPAROSCOPY     x 4 for endometriosis   . VAGINAL HYSTERECTOMY  1994    Family History  Problem Relation Age of Onset  . Colon cancer Mother   . Breast cancer Mother 71       and liver  . Hyperlipidemia Father   . Alcohol abuse Father   . Heart attack  Father   . Cancer Father        Head and neck   . Hyperlipidemia Brother   . Lung cancer Maternal Grandmother   . Parkinson's disease Maternal Grandfather   . Dementia Maternal Grandfather   . Prostate cancer Maternal Grandfather   . Parkinson's disease Paternal Grandmother   . Heart attack Paternal Grandfather   . Kidney cancer Sister 61  . Esophageal cancer Neg Hx     Allergies  Allergen Reactions  . Sulfa Antibiotics Anaphylaxis  . Sumatriptan Succinate Anaphylaxis  . Emgality [Galcanezumab-Gnlm] Nausea Only    GI upset - diarrhea, nausea    Current Outpatient Medications on File Prior to Visit  Medication Sig Dispense Refill  . baclofen (LIORESAL) 10 MG tablet Take 1 tablet by mouth 3 (three) times daily as needed.  2  . BOTOX 200 units SOLR Inject 200 Units as directed every 3 (three) months. 1 each 3  . Calcium Carbonate-Vitamin D 600-400 MG-UNIT tablet Take 1 tablet by mouth 2 (two) times daily. 60 tablet 11  . Coenzyme Q10 (CO Q 10 PO) Take by mouth.    . denosumab (PROLIA) 60 MG/ML SOSY injection Inject 60 mg into the skin every 6 (six) months. 180 mL 1  . dicyclomine (BENTYL) 10 MG capsule Take 1 capsule (10 mg total) by mouth 2 (two) times daily. Take 1/2 hour before and meal and bedtime 60 capsule 6  . escitalopram (LEXAPRO) 10 MG tablet Take 1 tablet (10 mg total) by mouth daily. 90 tablet 0  . eszopiclone (LUNESTA) 2 MG TABS tablet Take 1 tablet (2 mg total) by mouth at bedtime as needed for sleep. Take immediately before bedtime 45 tablet 0  . gabapentin (NEURONTIN) 100 MG capsule Take 1 capsule (100 mg total) by mouth 3 (three) times daily. 60 capsule 1  . nadolol (CORGARD) 40 MG tablet Take 1 tablet (40 mg total) by mouth daily. 90 tablet 3  . ondansetron (ZOFRAN ODT) 4 MG disintegrating tablet Take 1 tablet (4 mg total) by mouth every 6 (six) hours as needed for nausea or vomiting. 30 tablet 3  . pantoprazole (PROTONIX) 40 MG tablet Take 1 tablet (40 mg total) by  mouth daily. 30 tablet 6  . polycarbophil (FIBERCON) 625 MG tablet Take 625 mg by mouth daily.    . Riboflavin 400 MG CAPS Take 1 capsule by mouth daily.    . Rimegepant Sulfate (NURTEC) 75 MG TBDP Take 75 mg by mouth every other day. 16 tablet 11  . Topiramate ER (TROKENDI XR) 100 MG CP24 Take 100 mg by mouth 2 (two) times daily. Taper as directed/as tolerated to 1 daily 60 capsule 2  . Vitamin D, Ergocalciferol, (DRISDOL)  1.25 MG (50000 UNIT) CAPS capsule Take 1 capsule (50,000 Units total) by mouth every 7 (seven) days. Take for 8 total doses(weeks) 8 capsule 0   No current facility-administered medications on file prior to visit.    BP 122/69   Pulse 65   Temp 97.9 F (36.6 C) (Oral)   Resp 18   Ht 5\' 7"  (1.702 m)   Wt 148 lb 3.2 oz (67.2 kg)   SpO2 100%   BMI 23.21 kg/m       Objective:   Physical Exam  General Mental Status- Alert. General Appearance- Not in acute distress.   Skin General: Color- Normal Color. Moisture- Normal Moisture.  Neck Carotid Arteries- Normal color. Moisture- Normal Moisture. No carotid bruits. No JVD.  Chest and Lung Exam Auscultation: Breath Sounds:-Normal.  Cardiovascular Auscultation:Rythm- Regular. Murmurs & Other Heart Sounds:Auscultation of the heart reveals- No Murmurs.  Abdomen Inspection:-Inspeection Normal. Palpation/Percussion:Note:No mass. Palpation and Percussion of the abdomen reveal- Non Tender, Non Distended + BS, no rebound or guarding.    Neurologic Cranial Nerve exam:- CN III-XII intact(No nystagmus), symmetric smile. Strength:- 5/5 equal and symmetric strength both upper and lower extremities.      Assessment & Plan:  For you wellness exam today I have ordered cbc, cmp and lipid panel.  Flu vaccine and covid vaccine up to date.  Recommend exercise and healthy diet.  We will let you know lab results as they come in.  Follow up date appointment will be determined after lab review.   For gerd continue  protonix.  For depression will refill lexapro.  For migraine ha hx continue current meds and placed referral to neurologist Dr. Jaynee Eagles.  For insomnia rx low dose trazadone.   For tachcardia continue nadolol.   For fatigue will add b12, b1 and recheck vit D(hx of low vit D)  For high cholesterol history follow lipid panel. Zetia is non statin option in future if needed.   Mackie Pai, [place]    [id number] charge today as decided to go ahead and address/review all chronic problems and group wellness exam today as well. Placed on trazadone for insomnina, referal to neurologist placed and referral to sports medicine as well.

## 2020-11-11 LAB — VITAMIN D 1,25 DIHYDROXY
Vitamin D 1, 25 (OH)2 Total: 42 pg/mL (ref 18–72)
Vitamin D2 1, 25 (OH)2: 25 pg/mL
Vitamin D3 1, 25 (OH)2: 17 pg/mL

## 2020-11-12 LAB — VITAMIN B1: Vitamin B1 (Thiamine): 15 nmol/L (ref 8–30)

## 2020-11-22 MED FILL — TROKENDI XR 200 MG CAPSULE: 200 | 30 days supply | Qty: 30 | Fill #7

## 2020-12-13 ENCOUNTER — Ambulatory Visit (INDEPENDENT_AMBULATORY_CARE_PROVIDER_SITE_OTHER): Payer: No Typology Code available for payment source | Admitting: Medical

## 2020-12-13 ENCOUNTER — Other Ambulatory Visit: Payer: Self-pay | Admitting: Medical

## 2020-12-13 ENCOUNTER — Other Ambulatory Visit: Payer: Self-pay

## 2020-12-13 ENCOUNTER — Encounter: Payer: Self-pay | Admitting: Medical

## 2020-12-13 VITALS — BP 128/68 | HR 60 | Temp 98.2°F | Resp 18 | Ht 67.0 in | Wt 145.0 lb

## 2020-12-13 DIAGNOSIS — R519 Headache, unspecified: Secondary | ICD-10-CM | POA: Diagnosis not present

## 2020-12-13 DIAGNOSIS — H538 Other visual disturbances: Secondary | ICD-10-CM

## 2020-12-13 LAB — SEDIMENTATION RATE: Sed Rate: 1 mm/hr (ref 0–30)

## 2020-12-13 MED ORDER — KETOROLAC TROMETHAMINE 60 MG/2ML IM SOLN
60.0000 mg | Freq: Once | INTRAMUSCULAR | Status: AC
  Administered 2020-12-13: 60 mg via INTRAMUSCULAR

## 2020-12-13 MED ORDER — METHOCARBAMOL 750 MG PO TABS: ORAL_TABLET | ORAL | 0 refills | Status: DC

## 2020-12-13 MED FILL — METHOCARBAMOL 750 MG TABS: 750 | 10 days supply | Qty: 10 | Fill #0

## 2020-12-13 MED FILL — PANTOPRAZOLE SOD DR 40 MG T: 40 | 30 days supply | Qty: 30 | Fill #2

## 2020-12-13 NOTE — Progress Notes (Signed)
Subjective:    Patient ID: Alice Williams, female    DOB: 1961/03/22, 60 y.o.   MRN: [id number]  HPI Pt in with report of bitemporal area headache. She mentions she thinks temporal vein more visible. Recent light sensitivity. She does have history of migraine ha. Pt does botox injections. Pt got botox injection December 17,2022. This was done by [facility] [place].  A: Migraine  Muscle spasm   P: Botox 155 units injected today.  TPI done today - well tolerated RTC 3 Months.     Pt states she had ha on and off for 3 weeks. Pt sees Dr. Jaynee Eagles. Has appointment January 12, 2021.   Pt is on topiramate ER, nurtec and naldolol.   Failed triptans, amitryptline.   Does note high level of stress. Works 4-5 days a week and 12 hour days. Also in NP program.     Review of Systems  Constitutional: Negative for chills, fatigue and fever.  Respiratory: Negative for cough, chest tightness, shortness of breath and wheezing.   Cardiovascular: Negative for chest pain and palpitations.  Gastrointestinal: Negative for abdominal pain, constipation, diarrhea, nausea and vomiting.  Genitourinary: Negative for dysuria.  Musculoskeletal: Negative for back pain, myalgias and neck pain.  Skin: Negative for rash.  Neurological: Positive for headaches. Negative for dizziness, seizures, speech difficulty, weakness and light-headedness.  Hematological: Negative for adenopathy. Does not bruise/bleed easily.  Psychiatric/Behavioral: Negative for agitation, confusion, dysphoric mood and hallucinations. The patient is not nervous/anxious.     Past Medical History:  Diagnosis Date  . Anal fissure   . Anxiety   . Breast calcifications on mammogram   . Colon polyp   . Dyslipidemia 06/24/2018  . Elevated cholesterol   . Facet arthropathy, lumbosacral 08/28/2017  . GERD (gastroesophageal reflux disease)   . History of colon polyps   . IBS (irritable bowel syndrome)   . Inappropriate sinus  tachycardia   . Migraines   . Osteopenia   . Osteoporosis of femur without pathological fracture 08/28/2017  . Palpitations   . Premature surgical menopause      Social History   Socioeconomic History  . Marital status: Divorced    Spouse name: Not on file  . Number of children: 1  . Years of education: Not on file  . Highest education level: Master's degree (e.g., MA, MS, MEng, MEd, MSW, MBA)  Occupational History  . Occupation: Therapist, sports  Tobacco Use  . Smoking status: Former Smoker    Quit date: 08/09/2006    Years since quitting: 14.3  . Smokeless tobacco: Never Used  Vaping Use  . Vaping Use: Never used  Substance and Sexual Activity  . Alcohol use: Not Currently    Comment: rare  . Drug use: No  . Sexual activity: Never    Birth control/protection: Surgical  Other Topics Concern  . Not on file  Social History Narrative   Single, livevs alone in a 2 story house. Drinks one soda a day. Does not exercise regularly.   Social Determinants of Health   Financial Resource Strain: Not on file  Food Insecurity: Not on file  Transportation Needs: Not on file  Physical Activity: Not on file  Stress: Not on file  Social Connections: Not on file  Intimate Partner Violence: Not on file    Past Surgical History:  Procedure Laterality Date  . ANAL FISSURE REPAIR    . APPENDECTOMY  2000  . COLONOSCOPY W/ POLYPECTOMY  03/2015  . COLONOSCOPY WITH  ESOPHAGOGASTRODUODENOSCOPY (EGD)  04/01/2015  . KNEE SURGERY    . OOPHORECTOMY    . [place]  . PELVIC LAPAROSCOPY     x 4 for endometriosis   . VAGINAL HYSTERECTOMY  1994    Family History  Problem Relation Age of Onset  . Colon cancer Mother   . Breast cancer Mother 34       and liver  . Hyperlipidemia Father   . Alcohol abuse Father   . Heart attack Father   . Cancer Father        Head and neck   . Hyperlipidemia Brother   . Lung cancer Maternal Grandmother   . Parkinson's disease Maternal  Grandfather   . Dementia Maternal Grandfather   . Prostate cancer Maternal Grandfather   . Parkinson's disease Paternal Grandmother   . Heart attack Paternal Grandfather   . Kidney cancer Sister 46  . Esophageal cancer Neg Hx     Allergies  Allergen Reactions  . Sulfa Antibiotics Anaphylaxis  . Sumatriptan Succinate Anaphylaxis  . Emgality [Galcanezumab-Gnlm] Nausea Only    GI upset - diarrhea, nausea    Current Outpatient Medications on File Prior to Visit  Medication Sig Dispense Refill  . baclofen (LIORESAL) 10 MG tablet Take 1 tablet by mouth 3 (three) times daily as needed.  2  . BOTOX 200 units SOLR Inject 200 Units as directed every 3 (three) months. 1 each 3  . Calcium Carbonate-Vitamin D 600-400 MG-UNIT tablet Take 1 tablet by mouth 2 (two) times daily. 60 tablet 11  . Coenzyme Q10 (CO Q 10 PO) Take by mouth.    . denosumab (PROLIA) 60 MG/ML SOSY injection Inject 60 mg into the skin every 6 (six) months. 180 mL 1  . dicyclomine (BENTYL) 10 MG capsule Take 1 capsule (10 mg total) by mouth 2 (two) times daily. Take 1/2 hour before and meal and bedtime 60 capsule 6  . escitalopram (LEXAPRO) 10 MG tablet Take 1 tablet (10 mg total) by mouth daily. 90 tablet 1  . eszopiclone (LUNESTA) 2 MG TABS tablet Take 1 tablet (2 mg total) by mouth at bedtime as needed for sleep. Take immediately before bedtime 45 tablet 0  . gabapentin (NEURONTIN) 100 MG capsule Take 1 capsule (100 mg total) by mouth 3 (three) times daily. 60 capsule 1  . nadolol (CORGARD) 40 MG tablet Take 1 tablet (40 mg total) by mouth daily. 90 tablet 3  . ondansetron (ZOFRAN ODT) 4 MG disintegrating tablet Take 1 tablet (4 mg total) by mouth every 6 (six) hours as needed for nausea or vomiting. 30 tablet 3  . pantoprazole (PROTONIX) 40 MG tablet Take 1 tablet (40 mg total) by mouth daily. 30 tablet 6  . polycarbophil (FIBERCON) 625 MG tablet Take 625 mg by mouth daily.    . Riboflavin 400 MG CAPS Take 1 capsule by mouth  daily.    . Rimegepant Sulfate (NURTEC) 75 MG TBDP Take 75 mg by mouth every other day. 16 tablet 11  . Topiramate ER (TROKENDI XR) 100 MG CP24 Take 100 mg by mouth 2 (two) times daily. Taper as directed/as tolerated to 1 daily 60 capsule 2  . traZODone (DESYREL) 50 MG tablet Take 0.5-1 tablets (25-50 mg total) by mouth at bedtime as needed for sleep. 30 tablet 3  . Vitamin D, Ergocalciferol, (DRISDOL) 1.25 MG (50000 UNIT) CAPS capsule Take 1 capsule (50,000 Units total) by mouth every 7 (seven) days. Take for  8 total doses(weeks) 8 capsule 0   No current facility-administered medications on file prior to visit.    BP (!) 149/86   Pulse 60   Temp 98.2 F (36.8 C) (Oral)   Resp 18   Ht 5\' 7"  (1.702 m)   Wt 145 lb (65.8 kg)   SpO2 99%   BMI 22.71 kg/m       Objective:   Physical Exam   General Mental Status- Alert. General Appearance- Not in acute distress.   Skin General: Color- Normal Color. Moisture- Normal Moisture.  Neck Carotid Arteries- Normal color. Moisture- Normal Moisture.  No JVD. Trapezius muscle mild tender to palpation. Left side worse than rt side.  Chest and Lung Exam Auscultation: Breath Sounds:-Normal.  Cardiovascular Auscultation:Rythm- Regular. Murmurs & Other Heart Sounds:Auscultation of the heart reveals- No Murmurs.  Abdomen Inspection:-Inspeection Normal. Palpation/Percussion:Note:No mass. Palpation and Percussion of the abdomen reveal- Non Tender, Non Distended + BS, no rebound or guarding.   Neurologic Cranial Nerve exam:- CN III-XII intact(No nystagmus), symmetric smile. Drift Test:- No drift. Romberg Exam:- Negative.  Heal to Toe Gait exam:-Normal. Finger to Nose:- Normal/Intact Strength:- 5/5 equal and symmetric strength both upper and lower extremities. Left temporal area- mild tender to palpation. No obvious dilated veins seen.       Assessment & Plan:  History of migraine headaches with probable exacerbation although some  features today potential tension headache and temporal region pain as well so we will get sed rate to evaluate if temporal arteritis.  Continue current migraine medication.  We gave Toradol 60 mg IM injection today and prescribing Robaxin 750mg  tabs to use 1 tablet at night.  Will follow stat sed rate and notify you of results.  If sed rate elevation then will prescribe prednisone.  Asking that you give me an update by tomorrow morning as a status of headache.  If headache occurs with gross motor or sensory function deficits and recommend ED evaluation.  Would need imaging studies unless an area.  Keep appointment with neurologist in March.  Follow-up in 7 to 10 days or as needed.  Time spent with patient today was 30  minutes which consisted of chart review, discussing diagnosis, work up, treatment and documentation.

## 2020-12-13 NOTE — Patient Instructions (Signed)
History of migraine headaches with probable exacerbation although some features today potential tension headache and temporal region pain as well so we will get sed rate to evaluate if temporal arteritis.  Continue current migraine medication.  We gave Toradol 60 mg IM injection today and prescribing Robaxin 750mg  tabs to use 1 tablet at night.  Will follow stat sed rate and notify you of results.  If sed rate elevation then will prescribe prednisone.  Asking that you give me an update by tomorrow morning as a status of headache.  If headache occurs with gross motor or sensory function deficits and recommend ED evaluation.  Would need imaging studies unless an area.  Keep appointment with neurologist in March.  Follow-up in 7 to 10 days or as needed.

## 2020-12-14 ENCOUNTER — Encounter: Payer: Self-pay | Admitting: Medical

## 2020-12-17 ENCOUNTER — Telehealth: Payer: Self-pay | Admitting: Medical

## 2020-12-17 NOTE — Addendum Note (Signed)
Addended by: Anabel Halon on: 12/17/2020 08:39 AM   Modules accepted: Orders

## 2020-12-17 NOTE — Telephone Encounter (Signed)
Will you look at pt ct head order and attempt prior authorization. wouldl like her to get ct by Tuesday or wed at latest.

## 2020-12-19 NOTE — Telephone Encounter (Signed)
Thanks for the update on prior auth

## 2020-12-19 NOTE — Telephone Encounter (Signed)
Pt has [employer] plan [facility] is closed today for Holiday. I will get prior done Tuesday

## 2020-12-20 ENCOUNTER — Ambulatory Visit (HOSPITAL_BASED_OUTPATIENT_CLINIC_OR_DEPARTMENT_OTHER)
Admission: RE | Admit: 2020-12-20 | Discharge: 2020-12-20 | Disposition: A | Discharge status: Home / Self Care | Payer: No Typology Code available for payment source | Source: Ambulatory Visit | Attending: Medical | Admitting: Medical

## 2020-12-20 ENCOUNTER — Other Ambulatory Visit (HOSPITAL_BASED_OUTPATIENT_CLINIC_OR_DEPARTMENT_OTHER): Payer: Self-pay | Admitting: Medical

## 2020-12-20 ENCOUNTER — Other Ambulatory Visit: Payer: Self-pay

## 2020-12-20 DIAGNOSIS — H538 Other visual disturbances: Secondary | ICD-10-CM | POA: Insufficient documentation

## 2020-12-20 DIAGNOSIS — R519 Headache, unspecified: Secondary | ICD-10-CM | POA: Insufficient documentation

## 2020-12-27 MED FILL — TROKENDI XR 200 MG CAPSULE: 200 | 30 days supply | Qty: 30 | Fill #8

## 2021-01-04 MED FILL — TROKENDI XR 200 MG CAPSULE: 200 | 30 days supply | Qty: 30 | Fill #8

## 2021-01-12 ENCOUNTER — Other Ambulatory Visit: Payer: Self-pay

## 2021-01-12 ENCOUNTER — Encounter: Payer: Self-pay | Admitting: Neurology

## 2021-01-12 ENCOUNTER — Encounter: Payer: Self-pay | Admitting: *Deleted

## 2021-01-12 ENCOUNTER — Telehealth: Payer: Self-pay | Admitting: Neurology

## 2021-01-12 ENCOUNTER — Ambulatory Visit: Payer: No Typology Code available for payment source | Admitting: Neurology

## 2021-01-12 ENCOUNTER — Telehealth: Payer: Self-pay | Admitting: *Deleted

## 2021-01-12 ENCOUNTER — Other Ambulatory Visit: Payer: Self-pay | Admitting: *Deleted

## 2021-01-12 VITALS — BP 102/68 | HR 66 | Ht 67.0 in | Wt 141.0 lb

## 2021-01-12 DIAGNOSIS — H539 Unspecified visual disturbance: Secondary | ICD-10-CM

## 2021-01-12 DIAGNOSIS — M542 Cervicalgia: Secondary | ICD-10-CM

## 2021-01-12 DIAGNOSIS — R2990 Unspecified symptoms and signs involving the nervous system: Secondary | ICD-10-CM

## 2021-01-12 DIAGNOSIS — R519 Headache, unspecified: Secondary | ICD-10-CM

## 2021-01-12 DIAGNOSIS — F339 Major depressive disorder, recurrent, unspecified: Secondary | ICD-10-CM | POA: Diagnosis not present

## 2021-01-12 DIAGNOSIS — G43711 Chronic migraine without aura, intractable, with status migrainosus: Secondary | ICD-10-CM

## 2021-01-12 DIAGNOSIS — G4719 Other hypersomnia: Secondary | ICD-10-CM

## 2021-01-12 DIAGNOSIS — H5712 Ocular pain, left eye: Secondary | ICD-10-CM

## 2021-01-12 DIAGNOSIS — R270 Ataxia, unspecified: Secondary | ICD-10-CM

## 2021-01-12 DIAGNOSIS — R292 Abnormal reflex: Secondary | ICD-10-CM

## 2021-01-12 DIAGNOSIS — R29898 Other symptoms and signs involving the musculoskeletal system: Secondary | ICD-10-CM

## 2021-01-12 DIAGNOSIS — G8929 Other chronic pain: Secondary | ICD-10-CM

## 2021-01-12 DIAGNOSIS — R51 Headache with orthostatic component, not elsewhere classified: Secondary | ICD-10-CM

## 2021-01-12 MED ORDER — KETOROLAC TROMETHAMINE 60 MG/2ML IM SOLN
60.0000 mg | Freq: Once | INTRAMUSCULAR | Status: AC
  Administered 2021-01-12: 60 mg via INTRAMUSCULAR

## 2021-01-12 MED ORDER — QULIPTA 60 MG PO TABS: 60.0000 mg | ORAL_TABLET | Freq: Every day | ORAL | 11 refills | Status: DC

## 2021-01-12 MED ORDER — ATOGEPANT 60 MG PO TABS: 60.0000 mg | ORAL_TABLET | Freq: Every day | ORAL | 0 refills | Status: DC

## 2021-01-12 NOTE — Progress Notes (Signed)
Verbal order for qulipta 60 mg PO QD received from Dr Jaynee Eagles. Order sent to [facility] pharmacy.

## 2021-01-12 NOTE — Patient Instructions (Addendum)
Order Valene Bors - will let you know( I believe it is specialty pharmacy) Sleep study - will call Start qulipta: daily - you will be called by a specialty pharmacy [place] [facility] - they will call Ophthalmology - referral - they will call Dry needling - can refer to [facility] or check out [email or web address] MRI brain and cervical spine - your will be called TSH today - lab here Cefaly - can buy it online without a prescription, if I get mine back I can lend it to you Follow up in 3 months you can always reach me by mychart Consider Vyepti  Atogepant: Patient drug information   Copyright [phone] [place] rights reserved. (For additional information see "Atogepant: Drug information")  You must carefully read the "Consumer Information Use and Disclaimer" below in order to understand and correctly use this information.  Brand Names: [place]  Lenoria Chime  What is this drug used for?   It is used to prevent migraine headaches.  What do I need to tell my doctor BEFORE I take this drug?   If you are allergic to this drug; any part of this drug; or any other drugs, foods, or substances. Tell your doctor about the allergy and what signs you had.   If you have liver disease.   This is not a list of all drugs or health problems that interact with this drug.   Tell your doctor and pharmacist about all of your drugs (prescription or OTC, natural products, vitamins) and health problems. You must check to make sure that it is safe for you to take this drug with all of your drugs and health problems. Do not start, stop, or change the dose of any drug without checking with your doctor.  What are some things I need to know or do while I take this drug?   Tell all of your health care providers that you take this drug. This includes your doctors, nurses, pharmacists, and dentists.   If you drink grapefruit juice or eat grapefruit often, talk with your doctor.   Tell your doctor if you are pregnant, plan on getting  pregnant, or are breast-feeding. You will need to talk about the benefits and risks to you and the baby.  What are some side effects that I need to call my doctor about right away?   WARNING/CAUTION: Even though it may be rare, some people may have very bad and sometimes deadly side effects when taking a drug. Tell your doctor or get medical help right away if you have any of the following signs or symptoms that may be related to a very bad side effect:   Signs of an allergic reaction, like rash; hives; itching; red, swollen, blistered, or peeling skin with or without fever; wheezing; tightness in the chest or throat; trouble breathing, swallowing, or talking; unusual hoarseness; or swelling of the mouth, face, lips, tongue, or throat.  What are some other side effects of this drug?   All drugs may cause side effects. However, many people have no side effects or only have minor side effects. Call your doctor or get medical help if any of these side effects or any other side effects bother you or do not go away:   Upset stomach.   Constipation.   Feeling sleepy.   Feeling tired or weak.   These are not all of the side effects that may occur. If you have questions about side effects, call your doctor. Call your doctor for medical advice  about side effects.   You may report side effects to your national health agency.  How is this drug best taken?   Use this drug as ordered by your doctor. Read all information given to you. Follow all instructions closely.   Take with or without food.   If you are on dialysis and are taking this drug on the day you get dialysis, take it after your dialysis. If you have questions, talk with your doctor.  What do I do if I miss a dose?   Take a missed dose as soon as you think about it.   If it is close to the time for your next dose, skip the missed dose and go back to your normal time.   Do not take 2 doses at the same time or extra doses.  How do I store  and/or throw out this drug?   Store at room temperature in a dry place. Do not store in a bathroom.   Keep all drugs in a safe place. Keep all drugs out of the reach of children and pets.   Throw away unused or expired drugs. Do not flush down a toilet or pour down a drain unless you are told to do so. Check with your pharmacist if you have questions about the best way to throw out drugs. There may be drug take-back programs in your area.  General drug facts   If your symptoms or health problems do not get better or if they become worse, call your doctor.   Do not share your drugs with others and do not take anyone else's drugs.   Some drugs may have another patient information leaflet. If you have any questions about this drug, please talk with your doctor, nurse, pharmacist, or other health care provider.   If you think there has been an overdose, call your poison control center or get medical care right away. Be ready to tell or show what was taken, how much, and when it happened.  Last Reviewed Date2021-10-06 Consumer Information Use and Disclaimer   This generalized information is a limited summary of diagnosis, treatment, and/or medication information. It is not meant to be comprehensive and should be used as a tool to help the user understand and/or assess potential diagnostic and treatment options. It does NOT include all information about conditions, treatments, medications, side effects, or risks that may apply to a specific patient. It is not intended to be medical advice or a substitute for the medical advice, diagnosis, or treatment of a health care provider based on the health care provider's examination and assessment of a patient's specific and unique circumstances. Patients must speak with a health care provider for complete information about their health, medical questions, and treatment options, including any risks or benefits regarding use of medications. This information does not  endorse any treatments or medications as safe, effective, or approved for treating a specific patient. UpToDate, Inc. and its affiliates disclaim any warranty or liability relating to this information or the use thereof. The use of this information is governed by the Terms of Use, available at https://www.wolterskluwer.com/en/know/clinical-effectiveness-terms.     2022 UpToDate, Inc. and its affiliates and/or [place]. All rights reserved.

## 2021-01-12 NOTE — Telephone Encounter (Signed)
Order form for Nerivio migraine device completed, signed, and faxed to [facility]. Received a receipt of confirmation.

## 2021-01-12 NOTE — Progress Notes (Signed)
Patient was given Toradol 60 mg IM in LUOQ of L buttock. Pt tolerated well. See MAR.

## 2021-01-12 NOTE — Telephone Encounter (Signed)
no to the covid questions MR Brain w/wo contrast & MR Cervical spine wo contrast Dr. Lianne Bushy Focus Josem Kaufmann: [id number] (exp. 01/17/21 to 02/16/21) . Patient is scheduled at [facility] for 01/18/21.

## 2021-01-12 NOTE — Progress Notes (Addendum)
[facility]    Provider:  Dr Jaynee Eagles Requesting Provider: Elise Benne Primary Care Provider:  Mackie Pai, PA-C  CC:  Chronic migraines   HPI:  Alice Williams is a 60 y.o. female here as requested by Mackie Pai, PA-C for chronic migraines since childhood, been treated in the past by Dr. Tomi Likens and with [facility], the [facility] headache center, [facility] [place] neurology and [facility] neurology. She is currently a patient with a headache specialist [facility] and had her last botox in December of 2021.   She has had migraines since the age of 26th, they started on the right and then the left, when she was [age] she separated from her husband, she started having nausea and vomiting, the eye itself would shake, nystagmus, there is pain behind the left eye, the whole side of her left face, she will have pain in the temples, gets numb left side of the face, migraines lately radiate to the back of the head and they go all over, pulsating/pounding/throbbing, last week and this week has been awful, zofran doesn't help, she has diarrhea. She has had multiple tests and they have been normal. The left-sided facial changes numbness in the left side of the face is new. She quit doing the emgality because the GI doctor thought it may be causing the nausea and vomiting, stopped it and nothng has changed. She tried all three of the injections, none helped. The botox has helped, it works for 6 weeks, the last month she start getting migraines again. She never had light sensitivity and now she gets that too, she gets ringing in the ears, smells trigger, ongoing for 5-6 years, she is having difficulty with speech and getting words to come out that is new. She has associated dizziness, and she rolled over in bed and the light came across and blinded her, everything was spinning, she is clumsy and in the last year feeling she is more clumsy, everything is  "off", and then she feels when she falls over she feels imbalance. She has neck pain all the time. Speech changes with the migraine have been ongoing for years, aphasia with the migraines. She has headaches every day, migraines can last 2-3 days, severe migraines 5-6 a month and moderateratly severe 10 migraine days a month. Botox has reduced the migraine burden by 50% but she still have 1/2 month of migraines prior was daily migraines. Nurtec helped for a while and not so much lately. Roselyn Meier did not work. She has positional headaches, head posture can often exacerbate, movement makes it worse, bending down can worsen headache. Reports arm weakness.   Reviewed notes, labs and imaging from outside physicians, which showed:  12/13/2020: sed rate, cbc and cmp nml,   CT head 12/20/2020 showed No acute intracranial abnormalities including mass lesion or mass effect, hydrocephalus, extra-axial fluid collection, midline shift, hemorrhage, or acute infarction, large ischemic events (personally reviewed images). Normal.   I reviewed neurology notes from [facility], appears patient was seeing the headache center in [place] regularly, I reviewed those notes, she was also receiving Botox regularly for over a year, she has had migraines since a child, pain is stabbing throbbing pulsating, and bandlike, associated symptoms include dizziness, nausea, numbness, phonophobia and vomiting, no vision changes, photophobia, rhinorrhea or visual changes.  Aggravated by stress.  Located in the bilateral, frontal, occipital, retro-orbital temporal and parietal regions.  She is also been followed by low [facility] neurology Dr. Tomi Likens.  She did have Botox with Dr. Tomi Likens as well in 2019.  Last Botox at [facility] appears to have been in June 2021.  She was also previously seen by [facility] neurology and by the [facility].  No aura.  Looks like she also had botox 10/14/2020 unclear where ([facility]) and continues to see them last appointment 10/14/2020 and at this appointment they reported the botox doing very well.   From [facility] notes 12/2018 reviewed following and added items: CGRP: emgality, rimegepant(nurtec), Roselyn Meier Current and past medications ANALGESICS: tylenol, Excedrin, cafergot, Midol, cambia, ketorolac inj ANTI-MIGRAINE: sumatriptan (felt like I was going to die) HEART/BP: propranolol, nadolol DECONGESTANT/ANTIHISTAMINE: hydroxyzine, benadryl ANTI-NAUSEANT: phenergan, compazine, zofran NSAIDS: nabumetone, Etodolac, indocin, ibuprofen, naproxen, toradol inj, sprix MUSCLE RELAXANTS: tizanidine (felt weird), robaxin ANTI-CONVULSANTS: gabpaentin, topamax, trokendi STEROIDS: prednisone SLEEPING PILLS/TRANQUILIZERS: ANTI-DEPRESSANTS: amitriptyline, lexapro, sertraline HERBAL: melatonin, Mg, B2, CoQ10 FIBROMYALGIA:  HORMONAL: OTHER: Emgality (GI upset) PROCEDURES FOR HEADACHES: botox ( 3 treatments in 2019) The patient's allergies, current medications, past family history, past medical history, past social history, past surgical history and problem list were reviewed and updated as appropriate.  Past Medical History:  Diagnosis Date  ?Marland Kitchen Breast lump  ?Marland Kitchen Endometriosis  ?Marland Kitchen Heart palpitations  ?. IBS (irritable bowel syndrome)  ?Marland Kitchen Migraines  ?Marland Kitchen Sinus tachycardia   Past Surgical History:  Procedure Laterality Date  ?Marland Kitchen Hysterectomy  ?. Knee surgery Left  ?Marland Kitchen Tonsillectomy       Review of Systems: Patient complains of symptoms per HPI as well as the following symptoms headache, eye pain, numbness left side of face. Pertinent negatives and positives per HPI. All others negative.   Social History   Socioeconomic History  . Marital status: Divorced    Spouse name: Not on file  . Number of children: 1  . Years of education: Not on file  . Highest education level: Master's degree (e.g., MA, MS, MEng, MEd, MSW, MBA)  Occupational History  .  Occupation: Therapist, sports  Tobacco Use  . Smoking status: Former Smoker    Quit date: 08/09/2006    Years since quitting: 14.4  . Smokeless tobacco: Never Used  Vaping Use  . Vaping Use: Never used  Substance and Sexual Activity  . Alcohol use: Not Currently    Comment: rare  . Drug use: No  . Sexual activity: Never    Birth control/protection: Surgical  Other Topics Concern  . Not on file  Social History Narrative   Single, livevs alone in a 2 story house. Drinks two sodas a day. Does not exercise regularly.   Social Determinants of Health   Financial Resource Strain: Not on file  Food Insecurity: Not on file  Transportation Needs: Not on file  Physical Activity: Not on file  Stress: Not on file  Social Connections: Not on file  Intimate Partner Violence: Not on file    Family History  Problem Relation Age of Onset  . Colon cancer Mother   . Breast cancer Mother 62       and liver  . Migraines Mother   . Hyperlipidemia Father   . Alcohol abuse Father   . Heart attack Father   . Cancer Father        Head and neck   . Hyperlipidemia Brother   . Lung cancer Maternal Grandmother   . Parkinson's disease Maternal Grandfather   . Dementia Maternal Grandfather   . Prostate cancer Maternal Grandfather   . Parkinson's  disease Paternal Grandmother   . Heart attack Paternal Grandfather   . Kidney cancer Sister 19  . Migraines Sister   . Migraines Sister   . Esophageal cancer Neg Hx     Past Medical History:  Diagnosis Date  . Anal fissure   . Anxiety   . Breast calcifications on mammogram   . Colon polyp   . Dyslipidemia 06/24/2018  . Elevated cholesterol   . Facet arthropathy, lumbosacral 08/28/2017  . GERD (gastroesophageal reflux disease)   . History of colon polyps   . IBS (irritable bowel syndrome)   . Inappropriate sinus tachycardia   . Migraines   . Osteopenia   . Osteoporosis of femur without pathological fracture 08/28/2017  . Palpitations   . Premature  surgical menopause     Patient Active Problem List   Diagnosis Date Noted  . Chronic migraine without aura, with intractable migraine, so stated, with status migrainosus 01/12/2021  . Muscle spasm 10/14/2020  . Vitamin D deficiency 08/15/2020  . Acute bilateral low back pain with bilateral sciatica 07/13/2020  . Gastric polyp 02/11/2019  . Sessile colonic polyp 02/11/2019  . Chronic fatigue 02/11/2019  . Grade I internal hemorrhoids 02/11/2019  . Ductal hyperplasia of breast 08/21/2018  . Cognitive changes 06/24/2018  . Dyslipidemia 06/24/2018  . Dense breast tissue on mammogram 04/17/2018  . Fibrocystic breast determined by biopsy 04/09/2018  . Family history of breast cancer in mother 04/09/2018  . Paroxysmal sinus tachycardia ([place]) 03/27/2018  . Osteoporosis 08/28/2017  . Facet arthropathy, lumbosacral 08/28/2017  . Chronic left SI joint pain 08/27/2017  . Left lumbar radiculopathy 08/27/2017  . Allergic rhinitis 08/09/2017  . Disorder of lipoid metabolism 08/09/2017  . Insomnia secondary to anxiety 08/09/2017  . Postmenopausal osteoporosis 08/09/2017  . History of hysterectomy for benign disease 08/09/2017  . GAD (generalized anxiety disorder) 08/09/2017  . Former smoker, stopped smoking in distant past 08/09/2017  . Minor depressive disorder 08/09/2017  . Palpitations 03/16/2015  . Family history of colon cancer 03/02/2015  . Hiatal hernia with GERD without esophagitis 03/02/2015  . History of adenomatous polyp of colon 03/02/2015  . Chronic migraine without aura 10/10/2014  . History of cardiovascular disorder 01/18/2003    Past Surgical History:  Procedure Laterality Date  . ANAL FISSURE REPAIR    . APPENDECTOMY  2000  . COLONOSCOPY W/ POLYPECTOMY  03/2015  . COLONOSCOPY WITH ESOPHAGOGASTRODUODENOSCOPY (EGD)  04/01/2015  . KNEE SURGERY    . OOPHORECTOMY    . [place]  . PELVIC LAPAROSCOPY     x 4 for endometriosis   . VAGINAL HYSTERECTOMY   1994    Current Outpatient Medications  Medication Sig Dispense Refill  . Atogepant 60 MG TABS Take 60 mg by mouth daily. 16 tablet 0  . BOTOX 200 units SOLR Inject 200 Units as directed every 3 (three) months. 1 each 3  . Calcium Carbonate-Vitamin D 600-400 MG-UNIT tablet Take 1 tablet by mouth 2 (two) times daily. 60 tablet 11  . Coenzyme Q10 (CO Q 10 PO) Take by mouth.    . denosumab (PROLIA) 60 MG/ML SOSY injection Inject 60 mg into the skin every 6 (six) months. 180 mL 1  . dicyclomine (BENTYL) 10 MG capsule Take 1 capsule (10 mg total) by mouth 2 (two) times daily. Take 1/2 hour before and meal and bedtime 60 capsule 6  . escitalopram (LEXAPRO) 10 MG tablet Take 1 tablet (10 mg total) by mouth daily.  90 tablet 1  . gabapentin (NEURONTIN) 100 MG capsule Take 1 capsule (100 mg total) by mouth 3 (three) times daily. 60 capsule 1  . methocarbamol (ROBAXIN) 750 MG tablet 1 tab po q hs prn headache 10 tablet 0  . nadolol (CORGARD) 40 MG tablet Take 1 tablet (40 mg total) by mouth daily. 90 tablet 3  . ondansetron (ZOFRAN ODT) 4 MG disintegrating tablet Take 1 tablet (4 mg total) by mouth every 6 (six) hours as needed for nausea or vomiting. 30 tablet 3  . pantoprazole (PROTONIX) 40 MG tablet Take 1 tablet (40 mg total) by mouth daily. 30 tablet 6  . polycarbophil (FIBERCON) 625 MG tablet Take 625 mg by mouth daily.    . Riboflavin 400 MG CAPS Take 1 capsule by mouth daily.    . Rimegepant Sulfate (NURTEC) 75 MG TBDP Take 75 mg by mouth every other day. 16 tablet 11  . Topiramate ER (TROKENDI XR) 100 MG CP24 Take 100 mg by mouth 2 (two) times daily. Taper as directed/as tolerated to 1 daily 60 capsule 2  . traZODone (DESYREL) 50 MG tablet Take 0.5-1 tablets (25-50 mg total) by mouth at bedtime as needed for sleep. 30 tablet 3   No current facility-administered medications for this visit.    Allergies as of 01/12/2021 - Review Complete 01/12/2021  Allergen Reaction Noted  . Sulfa  antibiotics Anaphylaxis 11/22/2011  . Sumatriptan succinate Anaphylaxis 12/09/2015  . Emgality [galcanezumab-gnlm] Nausea Only 02/11/2019    Vitals: BP 102/68 (BP Location: Right Arm, Patient Position: Sitting)   Pulse 66   Ht 5' 7"  (1.702 m)   Wt 141 lb (64 kg)   SpO2 98%   BMI 22.08 kg/m  Last Weight:  Wt Readings from Last 1 Encounters:  01/12/21 141 lb (64 kg)   Last Height:   Ht Readings from Last 1 Encounters:  01/12/21 5' 7"  (1.702 m)     Physical exam: Exam: Gen: NAD, conversant, well nourised, well groomed                     CV: RRR, no MRG. No Carotid Bruits. No peripheral edema, warm, nontender Eyes: Conjunctivae clear without exudates or hemorrhage  Neuro: Detailed Neurologic Exam  Speech:    Speech is normal; fluent and spontaneous with normal comprehension.  Cognition:    The patient is oriented to person, place, and time;     recent and remote memory intact;     language fluent;     normal attention, concentration,     fund of knowledge Cranial Nerves:    The pupils are equal, round, and reactive to light. The fundi areflat. Visual fields are full to finger confrontation. Extraocular movements are intact. Trigeminal sensation is decreased left side. The face is symmetric. The palate elevates in the midline. Hearing intact. Voice is normal. Shoulder shrug is normal. The tongue has normal motion without fasciculations.   Coordination:    Normal finger to nose  Gait:    Good stride and stance  Motor Observation:    No asymmetry, no atrophy, and no involuntary movements noted. Tone:    Normal muscle tone.    Posture:    Posture is normal. normal erect    Strength:    Strength is V/V in the upper and lower limbs.      Sensation: intact to LT     Reflex Exam:  DTR's:    Deep tendon reflexes in the upper and lower  extremities are brisk bilaterally.   Toes:    The toes are equiv bilaterally.   Clonus:    Clonus is absent.     Assessment/Plan:  60 y.o. female here as requested by Mackie Pai, PA-C for chronic migraines since childhood, been treated in the past by Dr. Tomi Likens and with [facility] neurology, the [facility] headache center, [facility] [place] neurology and [facility] neurology. She is currently a patient with a headache specialist at [facility] and had her last botox in December of 2021.  We had a long conversation, she has been to many neurologists and still has a significant burden. Discussed stopping excedrin and sodas due to rebound. 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   Order Nerivio - will let you know( I believe it is specialty pharmacy) Start qulipta: daily - you will be called by a specialty pharmacy [place] [facility] - they will call Cefaly - can buy it online without a prescription, if I get mine back I can lend it to you Follow up in 3 months you can always reach me by mychart Consider Vyepti Continue botox Sleep study - morning headache, excessive daytime fatigue Ophthalmology - for new left eye pain and temporal headache, check for other causes of this new headache (esr nml) Dry needling - can refer to [facility] or check out [email or web address] - discussed MRI brain and cervical spine - MRI brain due to concerning symptoms of morning headaches, positional headaches,vision changes, numbness left side of face and left eye pain to look for space occupying mass, chiari or intracranial hypertension (pseudotumor) or other lesions(MS). Also MRI cervical spine for chronic neck pain, ataxia, abnormal reflexes (brisk) to eval for degenerative changes and myelopathy or cervical cord lesions.  TSH today - lab here   Orders Placed This Encounter  Procedures  . MR BRAIN W WO CONTRAST  . MR CERVICAL SPINE WO CONTRAST  . TSH  . Ambulatory referral to Sleep Studies  . Ambulatory referral to Physical Therapy  . Ambulatory referral to Ophthalmology   Meds ordered this encounter  Medications  . Atogepant 60  MG TABS    Sig: Take 60 mg by mouth daily.    Dispense:  16 tablet    Refill:  0    [id number] 11/2021  . ketorolac (TORADOL) injection 60 mg    Cc: Saguier, Iris Pert,  Saguier, [place], PA-C  Sarina Ill, MD  [facility] [address] [place], [place] [address]  Phone [phone] Fax [phone]

## 2021-01-13 LAB — TSH: TSH: 2 u[IU]/mL (ref 0.450–4.500)

## 2021-01-17 ENCOUNTER — Other Ambulatory Visit: Payer: No Typology Code available for payment source

## 2021-01-18 ENCOUNTER — Ambulatory Visit: Payer: No Typology Code available for payment source

## 2021-01-18 DIAGNOSIS — R270 Ataxia, unspecified: Secondary | ICD-10-CM | POA: Diagnosis not present

## 2021-01-18 DIAGNOSIS — R51 Headache with orthostatic component, not elsewhere classified: Secondary | ICD-10-CM | POA: Diagnosis not present

## 2021-01-18 DIAGNOSIS — G8929 Other chronic pain: Secondary | ICD-10-CM

## 2021-01-18 DIAGNOSIS — H539 Unspecified visual disturbance: Secondary | ICD-10-CM | POA: Diagnosis not present

## 2021-01-18 DIAGNOSIS — R29898 Other symptoms and signs involving the musculoskeletal system: Secondary | ICD-10-CM

## 2021-01-18 DIAGNOSIS — R292 Abnormal reflex: Secondary | ICD-10-CM | POA: Diagnosis not present

## 2021-01-18 DIAGNOSIS — M542 Cervicalgia: Secondary | ICD-10-CM

## 2021-01-18 DIAGNOSIS — H5712 Ocular pain, left eye: Secondary | ICD-10-CM

## 2021-01-18 DIAGNOSIS — R2990 Unspecified symptoms and signs involving the nervous system: Secondary | ICD-10-CM

## 2021-01-18 MED ORDER — GADOBENATE DIMEGLUMINE 529 MG/ML IV SOLN
15.0000 mL | Freq: Once | INTRAVENOUS | Status: AC | PRN
  Administered 2021-01-18: 15 mL via INTRAVENOUS

## 2021-01-19 ENCOUNTER — Encounter: Payer: Self-pay | Admitting: Medical

## 2021-01-24 ENCOUNTER — Telehealth: Payer: Self-pay | Admitting: *Deleted

## 2021-01-24 NOTE — Telephone Encounter (Signed)
Completed Qulipta PA on Cover My Meds. Key: [id number]. Awaiting determination from [place].

## 2021-02-02 IMAGING — MG DIGITAL SCREENING BILAT W/ TOMO W/ CAD
8 series · 9 of 24 positions shown · non-contrast
Comparison: Previous exam(s).

CLINICAL DATA: Screening.

EXAM:
DIGITAL SCREENING BILATERAL MAMMOGRAM WITH TOMO AND CAD

[L CC synth-2D]
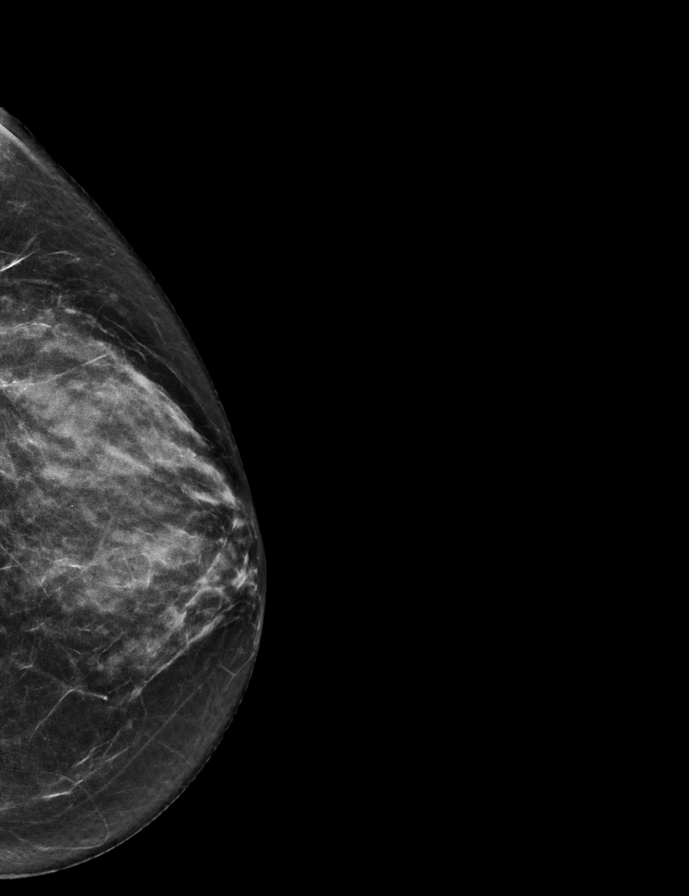

[R CC synth-2D]
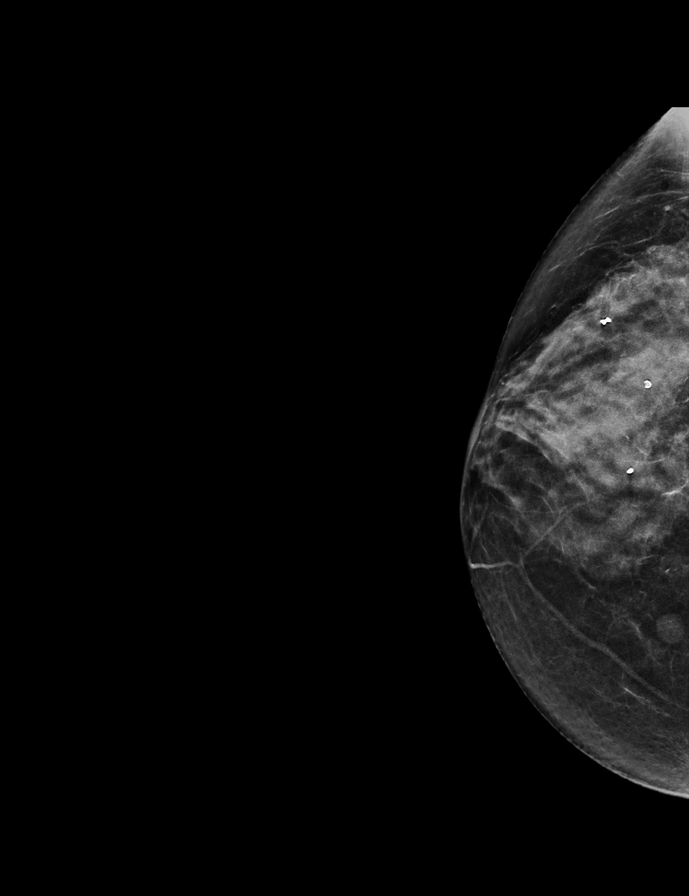

[L MLO synth-2D]
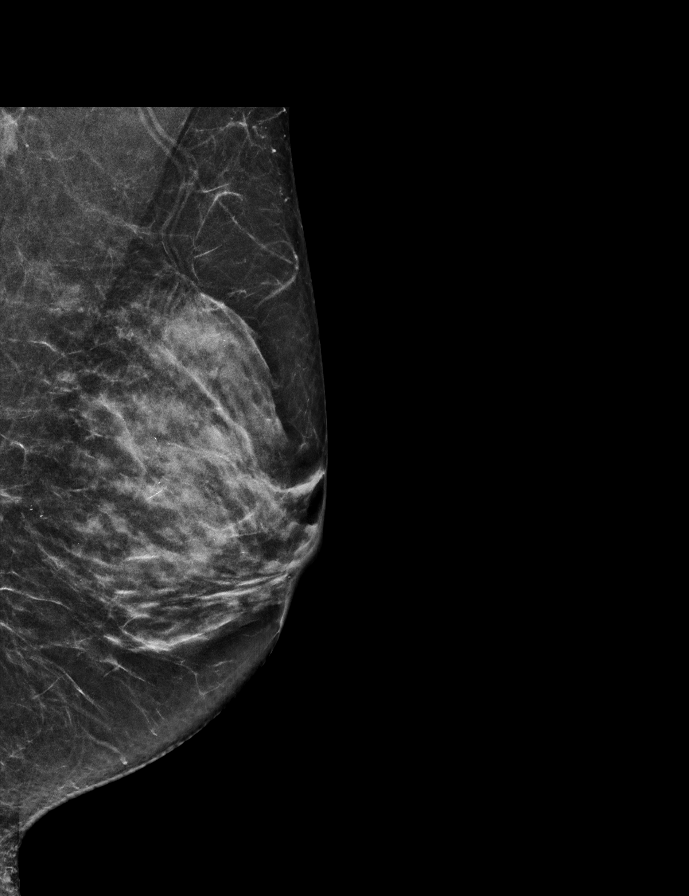

[R MLO synth-2D]
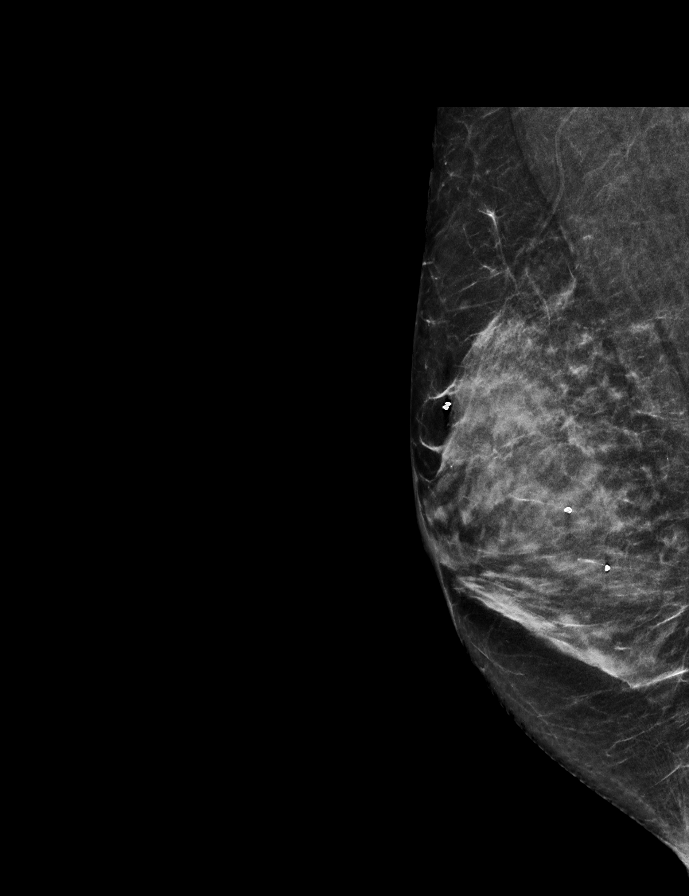

[L MLO tomo · 2 of 57 frames shown]
[frame 19/57]
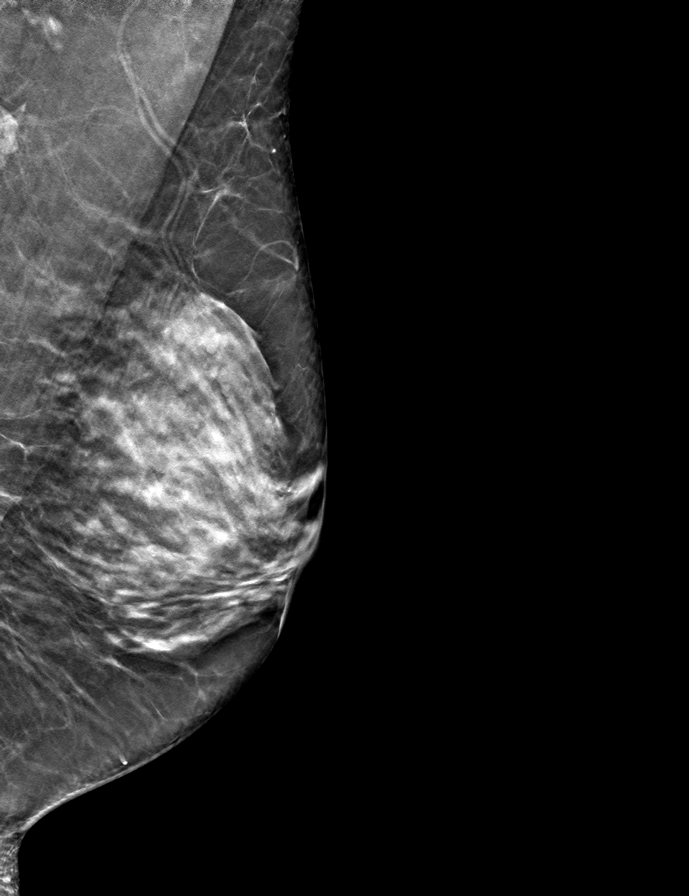
[frame 29/57]
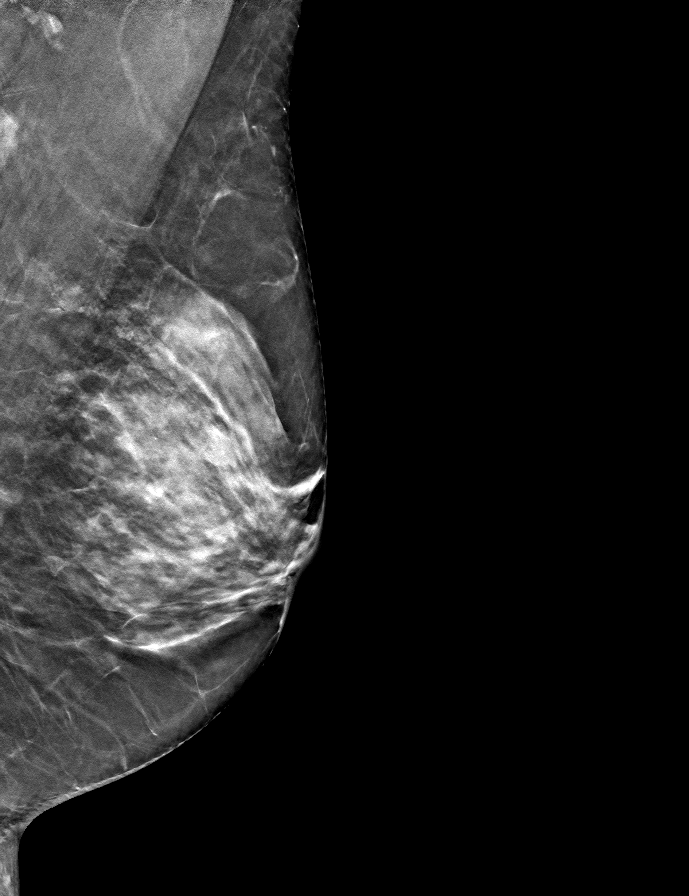

[R MLO tomo · tomo slice 27/54.0]
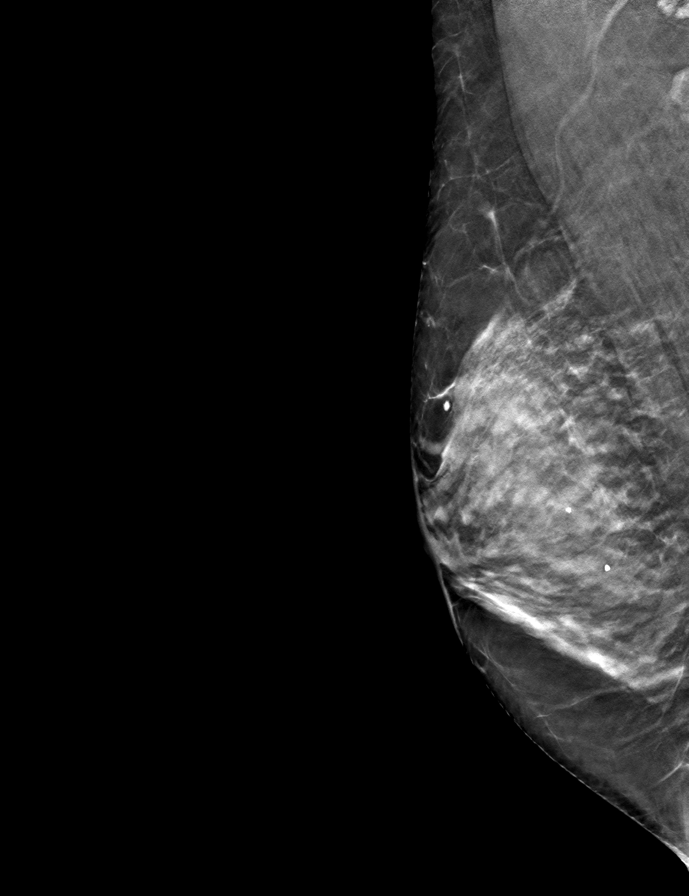

[L CC tomo · tomo slice 29/56.0]
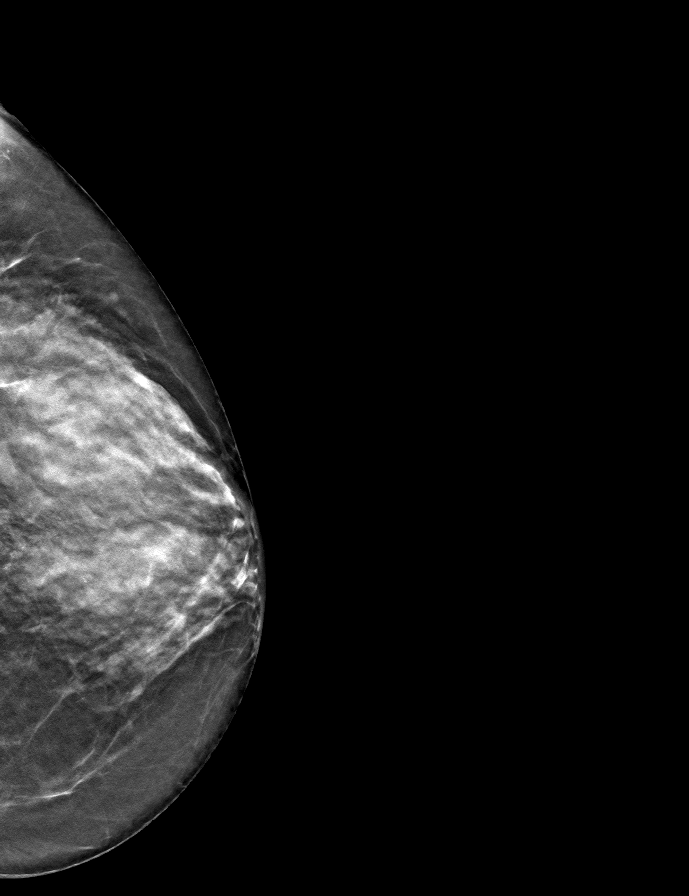

[R CC tomo · tomo slice 29/57.0]
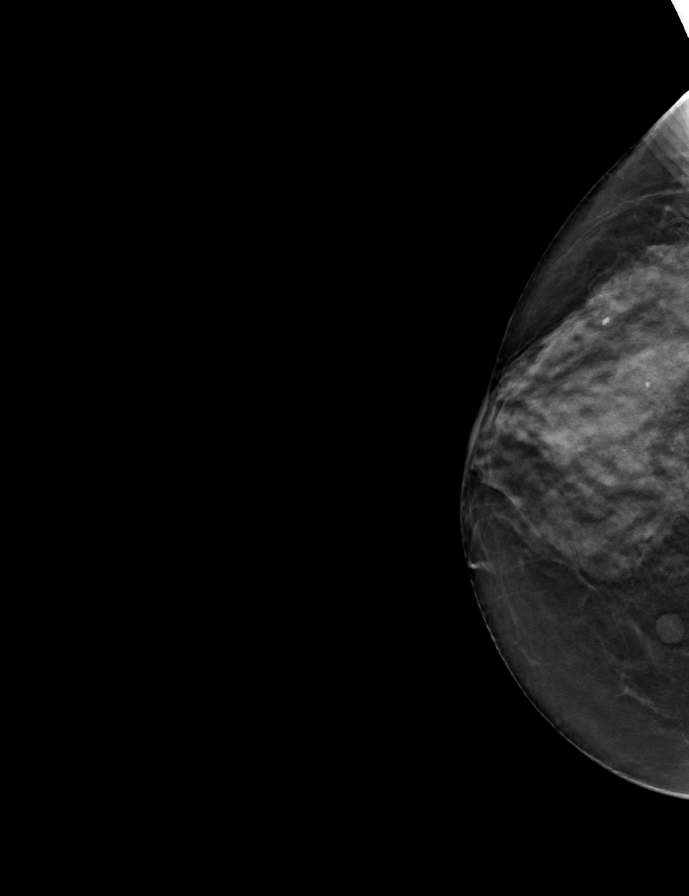

[9 of 24 positions shown; findings below may reference images not displayed]

ACR Breast Density Category d: The breast tissue is extremely dense,
which lowers the sensitivity of mammography
FINDINGS: There are no findings suspicious for malignancy. Images were
processed with CAD.
IMPRESSION: No mammographic evidence of malignancy. A result letter of this
screening mammogram will be mailed directly to the patient.

RECOMMENDATION:
Screening mammogram in one year. (Code:WO-[id number])

BI-RADS CATEGORY  1: Negative.

## 2021-02-07 ENCOUNTER — Other Ambulatory Visit (HOSPITAL_BASED_OUTPATIENT_CLINIC_OR_DEPARTMENT_OTHER): Payer: Self-pay

## 2021-02-07 ENCOUNTER — Other Ambulatory Visit: Payer: Self-pay | Admitting: Gastroenterology

## 2021-02-07 MED ORDER — PANTOPRAZOLE SODIUM 40 MG PO TBEC
DELAYED_RELEASE_TABLET | Freq: Every day | ORAL | 6 refills | Status: DC
  Filled 2021-02-07 – 2021-02-16 (×2): qty 30, 30d supply, fill #0
  Filled 2021-03-23: qty 30, 30d supply, fill #1
  Filled 2021-04-28: qty 30, 30d supply, fill #2
  Filled 2021-05-26: qty 30, 30d supply, fill #3
  Filled 2021-07-11: qty 30, 30d supply, fill #4
  Filled 2021-08-24: qty 30, 30d supply, fill #5
  Filled 2021-10-06: qty 30, 30d supply, fill #6

## 2021-02-07 MED FILL — Nadolol Tab 40 MG: ORAL | 90 days supply | Qty: 90 | Fill #0 | Status: AC

## 2021-02-14 ENCOUNTER — Other Ambulatory Visit (HOSPITAL_BASED_OUTPATIENT_CLINIC_OR_DEPARTMENT_OTHER): Payer: Self-pay

## 2021-02-16 ENCOUNTER — Other Ambulatory Visit: Payer: Self-pay | Admitting: Neurology

## 2021-02-16 ENCOUNTER — Other Ambulatory Visit: Payer: Self-pay

## 2021-02-16 ENCOUNTER — Other Ambulatory Visit (HOSPITAL_BASED_OUTPATIENT_CLINIC_OR_DEPARTMENT_OTHER): Payer: Self-pay

## 2021-02-16 ENCOUNTER — Encounter: Payer: Self-pay | Admitting: Neurology

## 2021-02-16 ENCOUNTER — Ambulatory Visit: Payer: No Typology Code available for payment source | Admitting: Neurology

## 2021-02-16 VITALS — BP 106/69 | HR 60 | Ht 67.0 in | Wt 140.0 lb

## 2021-02-16 DIAGNOSIS — M2619 Other specified anomalies of jaw-cranial base relationship: Secondary | ICD-10-CM | POA: Diagnosis not present

## 2021-02-16 DIAGNOSIS — Z73819 Behavioral insomnia of childhood, unspecified type: Secondary | ICD-10-CM | POA: Insufficient documentation

## 2021-02-16 DIAGNOSIS — R519 Headache, unspecified: Secondary | ICD-10-CM | POA: Diagnosis not present

## 2021-02-16 DIAGNOSIS — H5712 Ocular pain, left eye: Secondary | ICD-10-CM

## 2021-02-16 DIAGNOSIS — G44011 Episodic cluster headache, intractable: Secondary | ICD-10-CM | POA: Diagnosis not present

## 2021-02-16 MED ORDER — TROKENDI XR 200 MG PO CP24
ORAL_CAPSULE | ORAL | 11 refills | Status: DC
  Filled 2021-02-16: qty 30, 30d supply, fill #0
  Filled 2021-03-23: qty 30, 30d supply, fill #1
  Filled 2021-04-28: qty 30, 30d supply, fill #2
  Filled 2021-06-06: qty 30, 30d supply, fill #3
  Filled 2021-07-11: qty 30, 30d supply, fill #4
  Filled 2021-08-24: qty 30, 30d supply, fill #5
  Filled 2021-10-13: qty 30, 30d supply, fill #6

## 2021-02-16 MED FILL — Trazodone HCl Tab 50 MG: ORAL | 30 days supply | Qty: 30 | Fill #0 | Status: AC

## 2021-02-16 MED FILL — Escitalopram Oxalate Tab 10 MG (Base Equiv): ORAL | 90 days supply | Qty: 90 | Fill #0 | Status: AC

## 2021-02-16 NOTE — Addendum Note (Signed)
Addended by: Larey Seat on: 02/16/2021 09:59 AM   Modules accepted: Orders

## 2021-02-16 NOTE — Progress Notes (Signed)
[facility]EP [facility]    Provider:  Larey Seat, MD  Primary Care Physician:  Elise Benne [address] [place] [address]     Referring Provider:  Dr. Jaynee Eagles, MD         Chief Complaint according to patient   Patient presents with:    . New Patient (Initial Visit)     Presents today with concerns of not getting good sleep. She states she can't remember the last time she has slept through the night. She wakes up with headaches. Avg 6/8 hrs of sleep but broken, never straight through. Never had a SS. 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 Jaynee Eagles.  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 .  Seen by Dr Jaynee Eagles for headaches.      Alice Williams has a past medical history of Anal fissure, Anxiety, Breast calcifications on mammogram, Colon polyp, Dyslipidemia (06/24/2018), Elevated cholesterol, Facet arthropathy, lumbosacral (08/28/2017), GERD (gastroesophageal reflux disease), History of colon polyps, IBS (irritable bowel syndrome), Inappropriate sinus tachycardia, Headaches / cluster - starting on Right scalp , first onset in her 12s, Migraines with vomiting , onset at age 59, Osteopenia, Osteoporosis of femur without pathological fracture (08/28/2017), Palpitations, Endometriosis, painful Dysmenorrhea, and Premature surgical menopause.    Sleep relevant medical history: unclear if any hormonal contribution to migraine/ cluster. All her life , she had marked daytime sleepiness. Migraine triggers are bright light and weather changes.    Family medical /sleep history:  Mother was chronically non refreshed form sleep, migraines, elder sister as well- other female family members with  Insomnia and paradox reaction to Benadryl.    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  Family status is divorced.The patient currently works day time shifts, [employer]-used to work in shifts( Presenter, broadcasting,)  Tobacco use none now- quit 15 years ago- ETOH use : none , Caffeine intake in form of Coffee( /) Soda( /) Tea ( /) , no energy drinks (), but rarely medication with caffeine. Regular exercise in form of -none    Sleep habits are as follows:  The patient's dinner time is between 2-4 PM, irregular, often skipped. The patient goes to bed at 10.30  PM on a pre work day, and if not working, her bedtime is 12, once asleep she continues to sleep for 2 hour intervals, has no bathroom breaks, the first time at 1.30 AM, 3 AM, 4 AM.   The preferred sleep position is sideways, with the support of 2 pillows on a flat bed .  Dreams are reportedly frequent. 5.30 AM is the usual rise time. The patient wakes up *with an alarm.   She reports not feeling refreshed or restored in AM, with symptoms such as dry mouth,  morning headaches- eye pain/ left retro-orbital pressure , and residual fatigue. Blurred vision, diplopia.   Naps are taken frequently, lasting from 1-5 h and are not more refreshing than nocturnal sleep.  Review of Systems: Out of a complete 14 system review, the patient complains of only the following symptoms, and all other reviewed systems are negative.:  Fatigue, sleepiness ,  May be snoring, very fragmented sleep,  Insomnia - for the second half of the night:    How likely are you to doze in the following situations: 0 = not likely, 1 = slight chance, 2 = moderate chance, 3 = high chance   Sitting and Reading? Watching Television? Sitting inactive in a public place (theater or meeting)? As a passenger in a car  for an hour without a break? Lying down in the afternoon when circumstances permit? Sitting and talking to someone? Sitting quietly after lunch without alcohol? In a car, while stopped for a few minutes in traffic?   Total = 8-10/ 24 points   FSS endorsed at 63 / 63 points. Never feeling energized.   Social History   Socioeconomic History  . Marital status: Divorced    Spouse name: Not on file  . Number of children: 1  . Years of education: Not on file  . Highest education level: Master's degree (e.g., MA, MS, MEng, MEd, MSW, MBA)  Occupational History  . Occupation: Therapist, sports  Tobacco Use  . Smoking status: Former Smoker    Quit date: 08/09/2006    Years since quitting: 14.5  . Smokeless tobacco: Never Used  Vaping Use  . Vaping Use: Never used  Substance and Sexual Activity  . Alcohol use: Not Currently    Comment: rare  . Drug use: No  . Sexual activity: Never    Birth control/protection: Surgical  Other Topics Concern  . Not on file  Social History Narrative   Single, livevs alone in a 2 story house. Drinks two sodas a day. Does not exercise regularly.   Social Determinants of Health   Financial Resource Strain: Not on file  Food Insecurity: Not on file  Transportation Needs: Not on file  Physical Activity: Not on file  Stress: Not on file  Social Connections: Not on file    Family History  Problem Relation Age of Onset  . Colon cancer Mother   . Breast cancer Mother 30       and liver  . Migraines Mother   . Hyperlipidemia Father   . Alcohol abuse Father   . Heart attack Father   . Cancer Father        Head and neck   . Hyperlipidemia Brother   . Lung cancer Maternal Grandmother   . Parkinson's disease Maternal Grandfather   . Dementia Maternal Grandfather   . Prostate cancer Maternal Grandfather   . Parkinson's disease Paternal Grandmother   . Heart attack Paternal Grandfather   . Kidney cancer Sister 48  . Migraines Sister   . Migraines Sister   .  Esophageal cancer Neg Hx     Past Medical History:  Diagnosis Date  . Anal fissure   . Anxiety   . Breast calcifications on mammogram   . Colon polyp   . Dyslipidemia 06/24/2018  . Elevated cholesterol   . Facet arthropathy, lumbosacral 08/28/2017  . GERD (gastroesophageal reflux disease)   . History of colon polyps   . IBS (irritable bowel syndrome)   . Inappropriate sinus tachycardia   . Migraines   . Osteopenia   . Osteoporosis of femur without pathological fracture 08/28/2017  . Palpitations   . Premature surgical menopause     Past Surgical History:  Procedure Laterality Date  .  ANAL FISSURE REPAIR    . APPENDECTOMY  2000  . COLONOSCOPY W/ POLYPECTOMY  03/2015  . COLONOSCOPY WITH ESOPHAGOGASTRODUODENOSCOPY (EGD)  04/01/2015  . KNEE SURGERY    . OOPHORECTOMY    . [place]  . PELVIC LAPAROSCOPY     x 4 for endometriosis   . VAGINAL HYSTERECTOMY  1994     Current Outpatient Medications on File Prior to Visit  Medication Sig Dispense Refill  . Atogepant (QULIPTA) 60 MG TABS Take 60 mg by mouth daily. 30 tablet 11  . Atogepant 60 MG TABS Take 60 mg by mouth daily. 16 tablet 0  . BOTOX 200 units SOLR INJECT 200 UNITS AS DIRECTED EVERY 3 (THREE) MONTHS. 1 each 3  . Calcium Carbonate-Vitamin D 600-400 MG-UNIT tablet Take 1 tablet by mouth 2 (two) times daily. 60 tablet 11  . Coenzyme Q10 (CO Q 10 PO) Take by mouth.    Marland Kitchen COVID-19 mRNA vaccine, Pfizer, 30 MCG/0.3ML injection INJECT AS DIRECTED .3 mL 0  . denosumab (PROLIA) 60 MG/ML SOSY injection INJECT 60 MG INTO THE SKIN EVERY 6 MONTHS. 180 mL 1  . dicyclomine (BENTYL) 10 MG capsule TAKE 1 CAPSULE BY MOUTH TWO TIMES DAILY 1/2 HOUR BEFORE A MEAL AND AT BEDTIME 60 capsule 6  . escitalopram (LEXAPRO) 10 MG tablet TAKE 1 TABLET BY MOUTH ONCE DAILY 90 tablet 1  . methocarbamol (ROBAXIN) 750 MG tablet TAKE 1 TABLET BY MOUTH EVERY NIGHT AT BEDTIME AS NEEDED FOR HEADACHE 10 tablet 0  . nadolol (CORGARD) 40 MG  tablet TAKE 1 TABLET (40 MG TOTAL) BY MOUTH DAILY. 90 tablet 3  . ondansetron (ZOFRAN ODT) 4 MG disintegrating tablet Take 1 tablet (4 mg total) by mouth every 6 (six) hours as needed for nausea or vomiting. 30 tablet 3  . pantoprazole (PROTONIX) 40 MG tablet TAKE 1 TABLET BY MOUTH ONCE DAILY 30 tablet 6  . polycarbophil (FIBERCON) 625 MG tablet Take 625 mg by mouth daily.    . Riboflavin 400 MG CAPS Take 1 capsule by mouth daily.    . Rimegepant Sulfate (NURTEC) 75 MG TBDP Take 75 mg by mouth every other day. 16 tablet 11  . Topiramate ER (TROKENDI XR) 200 MG CP24 Take 1 capsule by mouth daily.    . traZODone (DESYREL) 50 MG tablet TAKE 1/2 - 1 TABLET BY MOUTH EVERY NIGHT AT BEDTIME AS NEEDED FOR SLEEP 30 tablet 3   No current facility-administered medications on file prior to visit.    Allergies  Allergen Reactions  . Sulfa Antibiotics Anaphylaxis  . Sumatriptan Succinate Anaphylaxis  . Emgality [Galcanezumab-Gnlm] Nausea Only    GI upset - diarrhea, nausea    Physical exam:  Today's Vitals   02/16/21 0849  BP: 106/69  Pulse: 60  Weight: 140 lb (63.5 kg)  Height: 5\' 7"  (1.702 m)   Body mass index is 21.93 kg/m.   Wt Readings from Last 3 Encounters:  02/16/21 140 lb (63.5 kg)  01/12/21 141 lb (64 kg)  12/13/20 145 lb (65.8 kg)     Ht Readings from Last 3 Encounters:  02/16/21 5\' 7"  (1.702 m)  01/12/21 5\' 7"  (1.702 m)  12/13/20 5\' 7"  (1.702 m)      General: The patient is awake, alert and appears not in acute distress.  The patient is well groomed. Head: Normocephalic, atraumatic. Neck is supple. Mallampati 2, deviated to the left.  neck circumference: 13 inches . Small mouth, Nasal airflow  patent.  Retrognathia is seen. Hypoplastic chin, crowded teeth-  8 teeth were removed and she wore braces.  Dental status: biological  Cardiovascular:  Regular rate and cardiac rhythm by pulse,  without distended neck veins. Respiratory: Lungs are clear to auscultation.  Skin:   Without evidence of ankle edema, or rash. Trunk: The patient's posture is erect.   Neurologic exam : The patient is awake and alert, oriented to place and time.   Memory subjective described as intact.  Attention span & concentration ability appears normal.  Speech is fluent,  without  dysarthria, dysphonia or aphasia.  Mood and affect are appropriate.   Cranial nerves: no loss of smell or taste reported  Pupils are equal and briskly reactive to light. Funduscopic exam deferred. .  Extraocular movements in vertical and horizontal planes were intact and without nystagmus. No Diplopia. Visual fields by finger perimetry are intact. Hearing was intact to soft voice and finger rubbing.    Facial sensation intact to fine touch, slightly decreased in left midface.  Facial motor strength is asymmetric - the left ankle of the mouth is lower . But tongue and uvula both move slightly left ward .  Neck ROM : rotation, tilt and flexion extension were normal for age and shoulder shrug was symmetrical.    Motor exam:  Symmetric bulk, tone and ROM.   Normal tone without cog-wheeling, symmetrically reduced grip strength - a slow development. .   Sensory:  Fine touch and vibration were normal.  Proprioception tested in the upper extremities was normal.   Coordination: Rapid alternating movements in the fingers/hands were of normal speed.  The Finger-to-nose maneuver was intact without evidence of ataxia  or tremor.   Gait and station: Patient could rise unassisted from a seated position, walked without assistive device.  Stance is of normal width/ base and the patient turned with 3 steps.  Toe and heel walk were deferred.  Deep tendon reflexes: in the  upper and lower extremities are symmetric and intact.  Babinski response was deferred.      After spending a total time of 35   minutes face to face and additional time for physical and neurologic examination, review of laboratory studies,  personal  review of imaging studies, reports and results of other testing and review of referral information / records as far as provided in visit, I have established the following assessments:  1) Cluster and migraine headache history, waking up with retro-orbital pressure, sometimes Diplopia, and allways fatigued.  Sleep related headaches are very common.   2) Insomnia in the second half of the night- could be related to dream sleep, REM sleep . 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    3) possibly genetic trait for insomnia and late sleep phase with short sleep time.   My Plan is to proceed with:  1) increase trazodone to 50 mg hs 2) attended sleep study - early arrival, will leave early, too. Looking for myoclonic jerks and snoring , too. 3) OSA screening, too.   Caveat for sleep tech: PLEASE remember to not put a FFM on this type of patient.   I would like to thank Dr Jaynee Eagles  for allowing me to meet with and to take care of this pleasant patient.   In short, Alice Williams is presenting with poor sleep and sleep related headaches and Insomnia.  I plan to follow up either personally or through our  NP within 2-4 month.   CC: I will share my notes with Dr Jaynee Eagles.  Electronically signed by: Larey Seat, MD 02/16/2021 9:18 AM  [facility] Board certified by The [employer] of Sleep Medicine and Diplomate of the [employer] of Sleep Medicine. Board certified In Neurology through the [place], Fellow of the [employer] of Neurology. Medical Director of [facility].

## 2021-02-16 NOTE — Patient Instructions (Signed)
Insomnia Insomnia is a sleep disorder that makes it difficult to fall asleep or stay asleep. Insomnia can cause fatigue, low energy, difficulty concentrating, mood swings, and poor performance at work or school. There are three different ways to classify insomnia:  Difficulty falling asleep.  Difficulty staying asleep.  Waking up too early in the morning. Any type of insomnia can be long-term (chronic) or short-term (acute). Both are common. Short-term insomnia usually lasts for three months or less. Chronic insomnia occurs at least three times a week for longer than three months. What are the causes? Insomnia may be caused by another condition, situation, or substance, such as:  Anxiety.  Certain medicines.  Gastroesophageal reflux disease (GERD) or other gastrointestinal conditions.  Asthma or other breathing conditions.  Restless legs syndrome, sleep apnea, or other sleep disorders.  Chronic pain.  Menopause.  Stroke.  Abuse of alcohol, tobacco, or illegal drugs.  Mental health conditions, such as depression.  Caffeine.  Neurological disorders, such as Alzheimer's disease.  An overactive thyroid (hyperthyroidism). Sometimes, the cause of insomnia may not be known. What increases the risk? Risk factors for insomnia include:  Gender. Women are affected more often than men.  Age. Insomnia is more common as you get older.  Stress.  Lack of exercise.  Irregular work schedule or working night shifts.  Traveling between different time zones.  Certain medical and mental health conditions. What are the signs or symptoms? If you have insomnia, the main symptom is having trouble falling asleep or having trouble staying asleep. This may lead to other symptoms, such as:  Feeling fatigued or having low energy.  Feeling nervous about going to sleep.  Not feeling rested in the morning.  Having trouble concentrating.  Feeling irritable, anxious, or depressed. How  is this diagnosed? This condition may be diagnosed based on:  Your symptoms and medical history. Your health care provider may ask about: ? Your sleep habits. ? Any medical conditions you have. ? Your mental health.  A physical exam. How is this treated? Treatment for insomnia depends on the cause. Treatment may focus on treating an underlying condition that is causing insomnia. Treatment may also include:  Medicines to help you sleep.  Counseling or therapy.  Lifestyle adjustments to help you sleep better. Follow these instructions at home: Eating and drinking  Limit or avoid alcohol, caffeinated beverages, and cigarettes, especially close to bedtime. These can disrupt your sleep.  Do not eat a large meal or eat spicy foods right before bedtime. This can lead to digestive discomfort that can make it hard for you to sleep.   Sleep habits  Keep a sleep diary to help you and your health care provider figure out what could be causing your insomnia. Write down: ? When you sleep. ? When you wake up during the night. ? How well you sleep. ? How rested you feel the next day. ? Any side effects of medicines you are taking. ? What you eat and drink.  Make your bedroom a dark, comfortable place where it is easy to fall asleep. ? Put up shades or blackout curtains to block light from outside. ? Use a white noise machine to block noise. ? Keep the temperature cool.  Limit screen use before bedtime. This includes: ? Watching TV. ? Using your smartphone, tablet, or computer.  Stick to a routine that includes going to bed and waking up at the same times every day and night. This can help you fall asleep faster. Consider   making a quiet activity, such as reading, part of your nighttime routine.  Try to avoid taking naps during the day so that you sleep better at night.  Get out of bed if you are still awake after 15 minutes of trying to sleep. Keep the lights down, but try reading or  doing a quiet activity. When you feel sleepy, go back to bed.   General instructions  Take over-the-counter and prescription medicines only as told by your health care provider.  Exercise regularly, as told by your health care provider. Avoid exercise starting several hours before bedtime.  Use relaxation techniques to manage stress. Ask your health care provider to suggest some techniques that may work well for you. These may include: ? Breathing exercises. ? Routines to release muscle tension. ? Visualizing peaceful scenes.  Make sure that you drive carefully. Avoid driving if you feel very sleepy.  Keep all follow-up visits as told by your health care provider. This is important. Contact a health care provider if:  You are tired throughout the day.  You have trouble in your daily routine due to sleepiness.  You continue to have sleep problems, or your sleep problems get worse. Get help right away if:  You have serious thoughts about hurting yourself or someone else. If you ever feel like you may hurt yourself or others, or have thoughts about taking your own life, get help right away. You can go to your nearest emergency department or call:  Your local emergency services (911 in the [place]  A suicide crisis helpline, such as the [place] at 437-659-1570. This is open 24 hours a day. Summary  Insomnia is a sleep disorder that makes it difficult to fall asleep or stay asleep.  Insomnia can be long-term (chronic) or short-term (acute).  Treatment for insomnia depends on the cause. Treatment may focus on treating an underlying condition that is causing insomnia.  Keep a sleep diary to help you and your health care provider figure out what could be causing your insomnia. This information is not intended to replace advice given to you by your health care provider. Make sure you discuss any questions you have with your health care provider. Document  Revised: 08/25/2020 Document Reviewed: 08/25/2020 Elsevier Patient Education  2021 [place]. Quality Sleep Information, Adult Quality sleep is important for your mental and physical health. It also improves your quality of life. Quality sleep means you:  Are asleep for most of the time you are in bed.  Fall asleep within 30 minutes.  Wake up no more than once a night.  Are awake for no longer than 20 minutes if you do wake up during the night. Most adults need 7-8 hours of quality sleep each night. How can poor sleep affect me? If you do not get enough quality sleep, you may have:  Mood swings.  Daytime sleepiness.  Confusion.  Decreased reaction time.  Sleep disorders, such as insomnia and sleep apnea.  Difficulty with: ? Solving problems. ? Coping with stress. ? Paying attention. These issues may affect your performance and productivity at work, school, and at home. Lack of sleep may also put you at higher risk for accidents, suicide, and risky behaviors. If you do not get quality sleep you may also be at higher risk for several health problems, including:  Infections.  Type 2 diabetes.  Heart disease.  High blood pressure.  Obesity.  Worsening of long-term conditions, like arthritis, kidney disease, depression, Parkinson's disease,  and epilepsy. What actions can I take to get more quality sleep?  Stick to a sleep schedule. Go to sleep and wake up at about the same time each day. Do not try to sleep less on weekdays and make up for lost sleep on weekends. This does not work.  Try to get about 30 minutes of exercise on most days. Do not exercise 2-3 hours before going to bed.  Limit naps during the day to 30 minutes or less.  Do not use any products that contain nicotine or tobacco, such as cigarettes or e-cigarettes. If you need help quitting, ask your health care provider.  Do not drink caffeinated beverages for at least 8 hours before going to bed.  Coffee, tea, and some sodas contain caffeine.  Do not drink alcohol close to bedtime.  Do not eat large meals close to bedtime.  Do not take naps in the late afternoon.  Try to get at least 30 minutes of sunlight every day. Morning sunlight is best.  Make time to relax before bed. Reading, listening to music, or taking a hot bath promotes quality sleep.  Make your bedroom a place that promotes quality sleep. Keep your bedroom dark, quiet, and at a comfortable room temperature. Make sure your bed is comfortable. Take out sleep distractions like TV, a computer, smartphone, and bright lights.  If you are lying awake in bed for longer than 20 minutes, get up and do a relaxing activity until you feel sleepy.  Work with your health care provider to treat medical conditions that may affect sleeping, such as: ? Nasal obstruction. ? Snoring. ? Sleep apnea and other sleep disorders.  Talk to your health care provider if you think any of your prescription medicines may cause you to have difficulty falling or staying asleep.  If you have sleep problems, talk with a sleep consultant. If you think you have a sleep disorder, talk with your health care provider about getting evaluated by a specialist.      Where to find more information  [place] website: https://sleepfoundation.org  National Heart, Lung, and [place] ([place]): http://www.saunders.info/.pdf  Centers for Disease Control and Prevention (CDC): LearningDermatology.pl Contact a health care provider if you:  Have trouble getting to sleep or staying asleep.  Often wake up very early in the morning and cannot get back to sleep.  Have daytime sleepiness.  Have daytime sleep attacks of suddenly falling asleep and sudden muscle weakness (narcolepsy).  Have a tingling sensation in your legs with a strong urge to move your legs (restless legs syndrome).  Stop breathing  briefly during sleep (sleep apnea).  Think you have a sleep disorder or are taking a medicine that is affecting your quality of sleep. Summary  Most adults need 7-8 hours of quality sleep each night.  Getting enough quality sleep is an important part of health and well-being.  Make your bedroom a place that promotes quality sleep and avoid things that may cause you to have poor sleep, such as alcohol, caffeine, smoking, and large meals.  Talk to your health care provider if you have trouble falling asleep or staying asleep. This information is not intended to replace advice given to you by your health care provider. Make sure you discuss any questions you have with your health care provider. Document Revised: 01/22/2018 Document Reviewed: 01/22/2018 Elsevier Patient Education  2021 [employer].

## 2021-02-22 NOTE — Telephone Encounter (Addendum)
This request has not been approved. Based on the information submitted for review, you did not meet our guideline rules for the requested drug. In order for your request to be approved, your provider would need to show that you have met the guideline rules below. The details below are written in medical language. If you have questions, please contact your provider. In some cases, the requested medication or alternatives offered may have additional approval requirements. Our guideline named ATOGEPANT (the generic name for Qulipta 49m tablets) requires that you use the requested medication for the preventive treatment of episodic migraine (you experience 1-14 migraine days per month). When used for the preventive treatment of episodic migraine, we require that you have tried Aimovig, unless there is a medical reason why you cannot. Please know that Aimovig also requires a prior authorization. Your doctor told u[place]oreathat you have chronic migraine (you experience 15 or more migraine days per month). This request was denied because your condition does not meet the requirements for approval. Please talk with your provider about other treatment options for your condition or get [place]ore information if it will allow [employer]ato approve this request. A written notification letter will follow with additional details.  Summary: denied because pt has chronic migraines. If she has episodic, she needs to try Aimovig first.   If we should choose to appeal, faxed within 180 days to [phone]

## 2021-02-23 ENCOUNTER — Telehealth: Payer: Self-pay

## 2021-02-23 NOTE — Telephone Encounter (Signed)
LVM for pt to call me back to schedule sleep study  

## 2021-02-28 NOTE — Telephone Encounter (Signed)
We received a blank PA form via fax from [employer]. I am unsure they knew a PA was already done. I have faxed the denial letter to [facility] complete and asked if the pt can be enrolled in a bridge program despite denial. Received a receipt of confirmation.

## 2021-03-06 NOTE — Telephone Encounter (Signed)
Derk with [facility] came by office and stated they are trying to see if patient can be entered into the bridge program. He will update when he knows more.

## 2021-03-07 ENCOUNTER — Telehealth: Payer: Self-pay | Admitting: Neurology

## 2021-03-07 NOTE — Telephone Encounter (Signed)
[employer] (Derrick) called,would like to speak with Alice Williams, following up on ICD-10 codes. Call Contact info: [phone]

## 2021-03-07 NOTE — Telephone Encounter (Signed)
Alice Williams. Called needing to speak to the RN regarding the pt's Qulipta. Please advise.

## 2021-03-08 ENCOUNTER — Other Ambulatory Visit (HOSPITAL_BASED_OUTPATIENT_CLINIC_OR_DEPARTMENT_OTHER): Payer: Self-pay

## 2021-03-08 MED FILL — OnabotulinumtoxinA For Inj 200 Unit: INTRAMUSCULAR | 30 days supply | Qty: 1 | Fill #0 | Status: CN

## 2021-03-10 ENCOUNTER — Other Ambulatory Visit (HOSPITAL_BASED_OUTPATIENT_CLINIC_OR_DEPARTMENT_OTHER): Payer: Self-pay

## 2021-03-10 ENCOUNTER — Encounter: Payer: No Typology Code available for payment source | Admitting: Physician Assistant

## 2021-03-14 ENCOUNTER — Other Ambulatory Visit (HOSPITAL_BASED_OUTPATIENT_CLINIC_OR_DEPARTMENT_OTHER): Payer: Self-pay

## 2021-03-20 ENCOUNTER — Ambulatory Visit (INDEPENDENT_AMBULATORY_CARE_PROVIDER_SITE_OTHER): Payer: No Typology Code available for payment source | Admitting: Neurology

## 2021-03-20 ENCOUNTER — Other Ambulatory Visit: Payer: Self-pay

## 2021-03-20 DIAGNOSIS — G4733 Obstructive sleep apnea (adult) (pediatric): Secondary | ICD-10-CM

## 2021-03-20 DIAGNOSIS — Z73819 Behavioral insomnia of childhood, unspecified type: Secondary | ICD-10-CM

## 2021-03-20 DIAGNOSIS — M2619 Other specified anomalies of jaw-cranial base relationship: Secondary | ICD-10-CM

## 2021-03-20 DIAGNOSIS — G44011 Episodic cluster headache, intractable: Secondary | ICD-10-CM

## 2021-03-21 ENCOUNTER — Other Ambulatory Visit (HOSPITAL_BASED_OUTPATIENT_CLINIC_OR_DEPARTMENT_OTHER): Payer: Self-pay

## 2021-03-21 NOTE — Progress Notes (Signed)
   [facility] TEST (Watch PAT)  STUDY DATE: 03/20/21  DOB: 04-Jan-1961  MRN: [id number]  ORDERING CLINICIAN: Larey Seat, MD   REFERRING CLINICIAN: Sarina Ill, MD  [place], Percell Miller, [place]   CLINICAL INFORMATION/HISTORY: 02-16-2021 Alice Williams has a past medical history of  Breast calcifications on mammogram, Colon polyp, Dyslipidemia (06/24/2018), Elevated cholesterol, Facet arthropathy, lumbosacral (08/28/2017), GERD (gastroesophageal reflux disease),IBS (irritable bowel syndrome), Inappropriate sinus tachycardia, Headaches / cluster - starting on Right scalp , first onset in her 17s, Migraines with vomiting , onset at age 89, Osteoporosis of femur without pathological fracture (08/28/2017), Endometriosis, painful Dysmenorrhea, and Premature surgical menopause.  She reports morning headaches.   Epworth sleepiness score: 10/24.  BMI: 22.1 kg/m  Neck Circumference: 13"  FINDINGS:   Total Record Time (hours, min): 7 h 4 min  Total Sleep Time (hours, min):  6 h 47 min   Percent REM (%):    15.34%   Calculated pAHI (per hour): 14.5       REM pAHI: 28.8    NREM pAHI: 12.2 Supine AHI: N/A   Oxygen Saturation (%) Mean: 96  Minimum oxygen saturation (%):        88   O2 Saturation Range (%): 88-99  O2Saturation (minutes) <89%: 0 min   Pulse Mean (bpm):    57  Pulse Range (-86)   IMPRESSION: This HST confirmed the presence of mild -moderate OSA (obstructive sleep apnea) without hypoxemia and with Strong REM sleep dependence. Mild snoring is indicated by RDI. Intermittent Bradycardia was noted.    RECOMMENDATION: In a patient with sleep related headache, treatment of even mild sleep apnea is indicated. REM dependent apnea needs PAP therapy to be alleviated.  I recommend use of auto CPAP, with a small interface ( nasal pillow, nasal mask), and heated humidification. Settings to be: 4-14 cm water, 2 cm EPR.     INTERPRETING PHYSICIAN:  Larey Seat,  MD    [facility] and [facility] Sleep Board certified by The [employer] of Sleep Medicine and Diplomate of the [employer] of Sleep Medicine. Board certified In Neurology through the [place], Fellow of the [employer] of Neurology. Medical Director of [employer].  Sleep Summary  Oxygen Saturation Statistics   Start Study Time: End Study Time: Total Recording Time:         10:46:10 PM 5:51:06 AM 7 h, 4 min  Total Sleep Time % REM of Sleep Time:  6 h, 47 min  15.3    Mean: 96 Minimum: 88 Maximum: 99  Mean of Desaturations Nadirs (%):   92  Oxygen Desatur. %:  4-9 10-20 >20 Total  Events Number Total   14  1 93.3 6.7  0 0.0  15 100.0  Oxygen Saturation: <90 <=88 <85 <80 <70  Duration (minutes): Sleep % 0.1 0.0 0.0 0.0 0.0 0.0 0.0 0.0 0.0 0.0     Respiratory Indices      Total Events REM NREM All Night  pRDI: pAHI 3%: 102   95 28.8 28.8 13.4 12.2 15.5 14.5  ODI4%: pAHI4%:  15 13 6.6 1.6 2.3        Pulse Rate Statistics during Sleep (BPM)      Mean: 57 Minimum: 34 Maximum: 86

## 2021-03-23 ENCOUNTER — Other Ambulatory Visit (HOSPITAL_BASED_OUTPATIENT_CLINIC_OR_DEPARTMENT_OTHER): Payer: Self-pay

## 2021-04-03 ENCOUNTER — Telehealth: Payer: Self-pay | Admitting: *Deleted

## 2021-04-03 NOTE — Telephone Encounter (Signed)
Late entry from March 30, 2021. Per Dr Jaynee Eagles, we will order [facility] and see if we can get this approved. Order form/clinical intake form completed for Vyepti 100 mg q3 months IV infusion, signed, and faxed to [facility] along with office note, insurance and demographic information. Received a receipt of confirmation.

## 2021-04-03 NOTE — Telephone Encounter (Signed)
Received fax from [place] stating referral has been received and will be processed/reviewed. If they need additional information they will contact our office.

## 2021-04-04 NOTE — Procedures (Signed)
[facility] TEST (Watch PAT)  STUDY DATE: 03/20/21  DOB: 02/20/1961  MRN: [id number]  ORDERING CLINICIAN: Larey Seat, MD   REFERRING CLINICIAN: Sarina Ill, MD  [place], Percell Miller, [place]   CLINICAL INFORMATION/HISTORY: 02-16-2021 Alice Williams has a past medical history of  Breast calcifications on mammogram, Colon polyp, Dyslipidemia (06/24/2018), Elevated cholesterol, Facet arthropathy, lumbosacral (08/28/2017), GERD (gastroesophageal reflux disease),IBS (irritable bowel syndrome), Inappropriate sinus tachycardia, Headaches / cluster - starting on Right scalp , first onset in her 45s, Migraines with vomiting , onset at age 89, Osteoporosis of femur without pathological fracture (08/28/2017), Endometriosis, painful Dysmenorrhea, and Premature surgical menopause.  She reports morning headaches.   Epworth sleepiness score: 10/24.  BMI: 22.1 kg/m  Neck Circumference: 13"  FINDINGS:   Total Record Time (hours, min): 7 h 4 min  Total Sleep Time (hours, min):  6 h 47 min   Percent REM (%):    15.34%   Calculated pAHI (per hour): 14.5       REM pAHI: 28.8    NREM pAHI: 12.2 Supine AHI: N/A   Oxygen Saturation (%) Mean: 96  Minimum oxygen saturation (%):        88   O2 Saturation Range (%): 88-99  O2Saturation (minutes) <89%: 0 min   Pulse Mean (bpm):    57  Pulse Range (-86)   IMPRESSION: This HST confirmed the presence of mild -moderate OSA (obstructive sleep apnea) without hypoxemia and with Strong REM sleep dependence. Mild snoring is indicated by RDI. Intermittent Bradycardia was noted.    RECOMMENDATION: In a patient with sleep related headache, treatment of even mild sleep apnea is indicated. REM dependent apnea needs PAP therapy to be alleviated.  I recommend use of auto CPAP, with a small interface ( nasal pillow, nasal mask), and heated humidification. Settings to be: 4-14 cm water, 2 cm EPR.     INTERPRETING PHYSICIAN:  Larey Seat, MD     [facility] Sleep Board certified by The [employer] of Sleep Medicine and Diplomate of the [employer] of Sleep Medicine. Board certified In Neurology through the [place], Fellow of the [employer] of Neurology. Medical Director of [employer].  Sleep Summary  Oxygen Saturation Statistics   Start Study Time: End Study Time: Total Recording Time:         10:46:10 PM 5:51:06 AM 7 h, 4 min  Total Sleep Time % REM of Sleep Time:  6 h, 47 min  15.3    Mean: 96 Minimum: 88 Maximum: 99  Mean of Desaturations Nadirs (%):   92  Oxygen Desatur. %:  4-9 10-20 >20 Total  Events Number Total   14  1 93.3 6.7  0 0.0  15 100.0  Oxygen Saturation: <90 <=88 <85 <80 <70  Duration (minutes): Sleep % 0.1 0.0 0.0 0.0 0.0 0.0 0.0 0.0 0.0 0.0     Respiratory Indices      Total Events REM NREM All Night  pRDI: pAHI 3%: 102   95 28.8 28.8 13.4 12.2 15.5 14.5  ODI4%: pAHI4%:  15 13 6.6 1.6 2.3        Pulse Rate Statistics during Sleep (BPM)      Mean: 57 Minimum: 34 Maximum: 86

## 2021-04-04 NOTE — Addendum Note (Signed)
Addended by: Larey Seat on: 04/04/2021 05:27 PM   Modules accepted: Orders

## 2021-04-04 NOTE — Progress Notes (Signed)
IMPRESSION: This HST confirmed the presence of mild -moderate OSA (obstructive sleep apnea) without hypoxemia and with Strong REM sleep dependence. Mild snoring is indicated by RDI. Intermittent Bradycardia was noted.    RECOMMENDATION: In a patient with sleep related headache, treatment of even mild sleep apnea is indicated. REM dependent apnea needs PAP therapy to be alleviated. The patient has retrognathia and should avoid a FFM.  I recommend use of auto CPAP, with a small interface ( nasal pillow, nasal mask), and heated humidification. Settings to be: 4-14 cm water, 2 cm EPR.

## 2021-04-05 ENCOUNTER — Telehealth: Payer: Self-pay | Admitting: *Deleted

## 2021-04-05 NOTE — Telephone Encounter (Signed)
I spoke to the patient and reviewed the sleep study results. She is agreeable to move forward with CPAP therapy. She understands that it may take 6-12 weeks to get the machine. She will call our office to schedule a follow up between 31-90 days after the start of treatment (for insurance purposes).  I informed her we would send the orders to [facility]. Provided her with their phone number. She will let us know if she does not get a phone call from them within a couple of weeks.

## 2021-04-05 NOTE — Telephone Encounter (Signed)
-----   Message from Larey Seat, MD sent at 04/04/2021  5:27 PM EDT ----- IMPRESSION: This HST confirmed the presence of mild -moderate OSA (obstructive sleep apnea) without hypoxemia and with Strong REM sleep dependence. Mild snoring is indicated by RDI. Intermittent Bradycardia was noted.    RECOMMENDATION: In a patient with sleep related headache, treatment of even mild sleep apnea is indicated. REM dependent apnea needs PAP therapy to be alleviated. The patient has retrognathia and should avoid a FFM.  I recommend use of auto CPAP, with a small interface ( nasal pillow, nasal mask), and heated humidification. Settings to be: 4-14 cm water, 2 cm EPR.

## 2021-04-18 ENCOUNTER — Ambulatory Visit: Payer: No Typology Code available for payment source | Admitting: Neurology

## 2021-04-21 ENCOUNTER — Telehealth: Payer: No Typology Code available for payment source

## 2021-04-21 DIAGNOSIS — M816 Localized osteoporosis [Lequesne]: Secondary | ICD-10-CM

## 2021-04-21 NOTE — Telephone Encounter (Signed)
Prolia VOB initiated via [email or web address]  Last OV: 10/10/20 Next OV:  Last Prolia inj: 10/10/20 Next Prolia inj DUE:  04/11/21

## 2021-04-26 ENCOUNTER — Telehealth: Payer: Self-pay | Admitting: Radiology

## 2021-04-26 NOTE — Telephone Encounter (Signed)
Left message to call [facility] to schedule headache follow up (Botox) appointment in August with Allie Dimmer

## 2021-04-28 ENCOUNTER — Other Ambulatory Visit (HOSPITAL_BASED_OUTPATIENT_CLINIC_OR_DEPARTMENT_OTHER): Payer: Self-pay

## 2021-05-03 ENCOUNTER — Other Ambulatory Visit: Payer: Self-pay

## 2021-05-03 ENCOUNTER — Ambulatory Visit (INDEPENDENT_AMBULATORY_CARE_PROVIDER_SITE_OTHER): Payer: No Typology Code available for payment source | Admitting: *Deleted

## 2021-05-03 DIAGNOSIS — M816 Localized osteoporosis [Lequesne]: Secondary | ICD-10-CM | POA: Diagnosis not present

## 2021-05-03 NOTE — Progress Notes (Signed)
Patient here for her prolia injection. Patient received 60mg / mL X 1 injections SQ in left upper arm. She waited in the office for 15 minutes after receiving injection. She tolerated injection well.

## 2021-05-04 NOTE — Telephone Encounter (Signed)
We have received a fax from [place] stating patient's insurance is out of network. Need to discuss alternative infusion location with patient.

## 2021-05-08 NOTE — Telephone Encounter (Signed)
It's my understanding we will need to complete the drug PA for [facility].

## 2021-05-10 ENCOUNTER — Other Ambulatory Visit: Payer: Self-pay | Admitting: *Deleted

## 2021-05-10 NOTE — Telephone Encounter (Signed)
Vyepti PA completed on Cover My Meds. Key: [id number]. Awaiting determination from [place].

## 2021-05-13 NOTE — Telephone Encounter (Signed)
Prolia PA initiated via [email or web address]  Yezenia Fredrick (Key: [id number])  [employer] is reviewing your PA request. You may close this dialog, return to your dashboard, and perform other tasks.  To check for an update later, open this request again from your dashboard. If [employer] has not replied within 24 hours for urgent requests or within 48 hours for standard requests, please contact [employer] at [phone].

## 2021-05-15 NOTE — Telephone Encounter (Signed)
Received approval fror Vyepti 100mg /ml 2 fills 05-12-21 to 11-11-21.  [employer].  PA ref [place].

## 2021-05-15 NOTE — Telephone Encounter (Signed)
Fax confirmation to [facility] in [place] [facility].  [phone] fax confirmation received.

## 2021-05-16 NOTE — Telephone Encounter (Signed)
Alice Williams (Key: [id number])  [employer] is reviewing your PA request. You may close this dialog, return to your dashboard, and perform other tasks.  To check for an update later, open this request again from your dashboard. If [employer] has not replied within 24 hours for urgent requests or within 48 hours for standard requests, please contact [employer] at [phone].

## 2021-05-17 NOTE — Telephone Encounter (Signed)
Received fax from [facility] stating Prolia PA is approved for a maximum of 2 fills from 05/13/21 to 05/12/22.

## 2021-05-26 ENCOUNTER — Other Ambulatory Visit (HOSPITAL_BASED_OUTPATIENT_CLINIC_OR_DEPARTMENT_OTHER): Payer: Self-pay

## 2021-05-26 MED FILL — Nadolol Tab 40 MG: ORAL | 5 days supply | Qty: 5 | Fill #1 | Status: AC

## 2021-05-26 MED FILL — OnabotulinumtoxinA For Inj 200 Unit: INTRAMUSCULAR | 30 days supply | Qty: 1 | Fill #0 | Status: CN

## 2021-05-26 MED FILL — OnabotulinumtoxinA For Inj 200 Unit: INTRAMUSCULAR | 90 days supply | Qty: 1 | Fill #0 | Status: CN

## 2021-05-26 MED FILL — Nadolol Tab 40 MG: ORAL | 85 days supply | Qty: 85 | Fill #1 | Status: AC

## 2021-05-29 ENCOUNTER — Other Ambulatory Visit (HOSPITAL_BASED_OUTPATIENT_CLINIC_OR_DEPARTMENT_OTHER): Payer: Self-pay

## 2021-05-29 ENCOUNTER — Encounter: Payer: Self-pay | Admitting: *Deleted

## 2021-05-30 ENCOUNTER — Other Ambulatory Visit (HOSPITAL_BASED_OUTPATIENT_CLINIC_OR_DEPARTMENT_OTHER): Payer: Self-pay

## 2021-05-30 MED FILL — OnabotulinumtoxinA For Inj 200 Unit: INTRAMUSCULAR | 90 days supply | Qty: 1 | Fill #0 | Status: AC

## 2021-06-01 ENCOUNTER — Other Ambulatory Visit: Payer: Self-pay | Admitting: Gastroenterology

## 2021-06-01 DIAGNOSIS — R109 Unspecified abdominal pain: Secondary | ICD-10-CM

## 2021-06-01 DIAGNOSIS — R9389 Abnormal findings on diagnostic imaging of other specified body structures: Secondary | ICD-10-CM

## 2021-06-01 NOTE — Telephone Encounter (Signed)
I spoke with Laverne at the [facility]. She confirmed receipt of order. The patient is to call the infusion center to schedule. [phone].

## 2021-06-02 ENCOUNTER — Ambulatory Visit (INDEPENDENT_AMBULATORY_CARE_PROVIDER_SITE_OTHER): Payer: No Typology Code available for payment source | Admitting: Physician Assistant

## 2021-06-02 ENCOUNTER — Encounter: Payer: Self-pay | Admitting: Physician Assistant

## 2021-06-02 ENCOUNTER — Other Ambulatory Visit: Payer: Self-pay

## 2021-06-02 VITALS — BP 112/74 | HR 66

## 2021-06-02 DIAGNOSIS — G43709 Chronic migraine without aura, not intractable, without status migrainosus: Secondary | ICD-10-CM | POA: Diagnosis not present

## 2021-06-02 DIAGNOSIS — M62838 Other muscle spasm: Secondary | ICD-10-CM

## 2021-06-02 MED ORDER — ONABOTULINUMTOXINA 100 UNITS IJ SOLR
200.0000 [IU] | Freq: Once | INTRAMUSCULAR | Status: AC
  Administered 2021-06-02: 200 [IU] via INTRAMUSCULAR

## 2021-06-02 NOTE — Patient Instructions (Signed)
Botulinum Toxin Cosmetic Injection Botulinum toxin injection is a shot that is given to soften lines and wrinkles in the face. The medicine works by weakening the muscles in the face. Whenmuscles become weak, wrinkles are reduced. Tell a doctor about: Any allergies you have. It is important to tell the doctor if you are allergic to eggs. All the medicines you take. This includes: Antibiotics. Vitamins. Herbs. Eye drops. Creams. Over-the-counter medicines. Any problems you or your family have had with the use of anesthetics. Any blood problems you have. Any surgeries you have had. Any medical conditions you have. If you are pregnant or may be pregnant. If you are breastfeeding. What are the risks? Generally, this is a safe procedure, but there may be problems. Common problems include: Pain. Swelling. Bruising. Other problems include: Pain in the jaw. Pain in the neck. Allergic reaction to the shot. Damage to nearby structures or organs. Drooping eyebrow or eyelid. Double vision. Blurred vision. Changes in voice or speech. Inability to close one or both eyes. Trouble swallowing. Infection. What happens before the procedure? Do not drink any alcohol as told by your doctor. Ask your doctor about: Changing or stopping your regular medicines. This is important if you take diabetes medicines or blood thinners. Taking medicines such as aspirin and ibuprofen. These medicines can thin your blood. Do not take these medicines before the procedure unless your doctor tells you to take them. Taking over-the-counter medicines, vitamins, herbs, and supplements. What happens during the procedure?  The area to be treated may be: Chilled with ice. Numbed with medicine (local anesthetic). Your doctor will give you some shots of the toxin. How many shots you will get depends on: Your condition. Which area is being treated. The procedure may vary among doctors and hospitals. What can I  expect after the procedure? After these shots, it is common to have: Pain, soreness, swelling, itching, or redness on your face. Small bruises from the needle. Bumps or marks from the needle. Loss of feeling in areas where the needle entered the body. Headache. This is rare. It may take up to 14 days for the treatment to work. It may work sooner for some people. Results will last for about 3-4 months. Follow-up treatment willmake this last longer. Follow these instructions at home: Relieving pain and discomfort  Take over-the-counter and prescription medicines only as told by your doctor. If told, put ice to the injected area: Put ice in a plastic bag. Place a towel between your skin and the bag. Leave the ice on for 20 minutes, 2-3 times per day.  Activity Do not do any activities that take a lot of effort. Avoid these activities for 2 hours after the procedure or for as long as told by your doctor. This includes: Lifting heavy items. Working out. Doing activities that make your heart beat faster. General instructions Do not lie down for 4 hours after treatment or as told by your doctor. Do not rub the area where you got the shot. This can spread the toxin. Depending on where the shot was given: Your doctor may ask you not to move your muscles in that area. Follow what you are told. Your doctor may tell you to frown or squint regularly. You may need to do these exercises every 15 minutes for 1 hour after the shots. Follow what the doctor tells you. Do not get laser treatments, facials, or facial massages for 1-2 weeks after the procedure or for as long as told by  your doctor. Keep all follow-up visits with your doctor. This is important. Contact a doctor if: You have a headache that gets worse. You have a fever. Get help right away if: You have signs of an allergic reaction. These include: An itchy rash or welts. Wheezing or shortness of breath. Dizziness. Your eyelids start to  get droopy or swollen. You have trouble speaking. You feel short of breath or have trouble breathing. You start to have trouble swallowing. Summary Botulinum toxin injection is a shot that is given to soften lines and wrinkles in the face. This is a safe procedure. However, some problems may occur, including infection, drooping eyelid, double or blurred vision, and trouble swallowing. Follow your doctor's instructions before the procedure. These may include stopping or changing medicines or stopping alcohol use. You will be told how to care for yourself after the procedure. Get help right away if you have chest pain, fever, or an allergic reaction. Also, get help right away if you have problems with vision, trouble speaking, or you feel short of breath, become hoarse, or have trouble swallowing. This information is not intended to replace advice given to you by your health care provider. Make sure you discuss any questions you have with your healthcare provider. Document Revised: 06/05/2018 Document Reviewed: 06/05/2018 Elsevier Patient Education  [place].

## 2021-06-02 NOTE — Progress Notes (Signed)
S: Pt in office today for Botox injections. Her headaches are otherwise managed by Dr. Jaynee Eagles.  She is using Botox 200 units every 3 months.  However she missed her March appointment when there was a delay in accessing the Botox.     O: BP 112/74   Pulse 66    Botox Procedure Note Vial of Botox was : Supplied by Patient   Botox Dosing by [employer]cle Group for Chronic Migraine   Injection Sites for Migraines  Botox 155 units was injected using the dosage in the table above in the pattern shown above. Waste: 45 units  A: Migraine  Muscle spasm   P: Botox 155 units injected today.  RTC 3 Months.

## 2021-06-06 ENCOUNTER — Other Ambulatory Visit (HOSPITAL_BASED_OUTPATIENT_CLINIC_OR_DEPARTMENT_OTHER): Payer: Self-pay

## 2021-06-06 ENCOUNTER — Other Ambulatory Visit: Payer: Self-pay | Admitting: Medical

## 2021-06-06 MED ORDER — ESCITALOPRAM OXALATE 10 MG PO TABS
ORAL_TABLET | Freq: Every day | ORAL | 1 refills | Status: DC
  Filled 2021-06-06: qty 90, 90d supply, fill #0
  Filled 2021-10-06: qty 90, 90d supply, fill #1

## 2021-06-09 ENCOUNTER — Other Ambulatory Visit (HOSPITAL_COMMUNITY): Payer: Self-pay | Admitting: *Deleted

## 2021-06-12 ENCOUNTER — Encounter (HOSPITAL_COMMUNITY)
Admission: RE | Admit: 2021-06-12 | Discharge: 2021-06-12 | Disposition: A | Discharge status: Home / Self Care | Payer: No Typology Code available for payment source | Source: Ambulatory Visit | Attending: Neurology | Admitting: Neurology

## 2021-06-12 DIAGNOSIS — G43809 Other migraine, not intractable, without status migrainosus: Secondary | ICD-10-CM | POA: Diagnosis not present

## 2021-06-12 MED ORDER — SODIUM CHLORIDE 0.9 % IV SOLN
100.0000 mg | Freq: Once | INTRAVENOUS | Status: DC
  Administered 2021-06-12: 100 mg via INTRAVENOUS
  Filled 2021-06-12: qty 1

## 2021-07-11 ENCOUNTER — Other Ambulatory Visit (HOSPITAL_BASED_OUTPATIENT_CLINIC_OR_DEPARTMENT_OTHER): Payer: Self-pay

## 2021-07-27 ENCOUNTER — Encounter: Payer: Self-pay | Admitting: Neurology

## 2021-08-01 ENCOUNTER — Encounter: Payer: Self-pay | Admitting: Neurology

## 2021-08-01 ENCOUNTER — Ambulatory Visit: Payer: No Typology Code available for payment source | Admitting: Neurology

## 2021-08-01 VITALS — BP 124/78 | HR 63 | Ht 67.0 in | Wt 134.0 lb

## 2021-08-01 DIAGNOSIS — M2619 Other specified anomalies of jaw-cranial base relationship: Secondary | ICD-10-CM | POA: Diagnosis not present

## 2021-08-01 DIAGNOSIS — H5712 Ocular pain, left eye: Secondary | ICD-10-CM | POA: Diagnosis not present

## 2021-08-01 DIAGNOSIS — G4719 Other hypersomnia: Secondary | ICD-10-CM

## 2021-08-01 DIAGNOSIS — G4726 Circadian rhythm sleep disorder, shift work type: Secondary | ICD-10-CM

## 2021-08-01 NOTE — Progress Notes (Signed)
SLEEP MEDICINE CLINIC    Provider:  Larey Seat, MD  Primary Care Physician:  Elise [address] [place] [address]     Referring Provider:  Dr. Jaynee Eagles, MD         Chief Complaint according to patient   Patient presents with:     New Patient (Initial Visit)     Presents today with concerns of not getting good sleep.       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 Alice Williams had undergone a home sleep test dated 03-20-2021 and achieved a total sleep time of about 6 hours 47 minutes is over 15% REM sleep proportion.  Her AHI was mild 14.5/h but during REM sleep this doubled to 28.8/h.  REM sleep dependent apnea was not associated with hypoxia and I recommended the use of auto CPAP.   The settings prescribed for 5 through 14 so she does not require higher pressures   Was 2 cm EPR the machine was finally delivered 3 months ago in July.  The data for the last 30 days show a very good compliance 70% 21 out of 30 days but not all of these days where over 4 hours of consecutive use.  So I like for her to improve on that use the machine daily and also if she takes naps she may at that hour now up to her long time.  The patient's residual AHI is 2.0 which is an excellent resolution the 95th percentile pressure is five-point. that she does have an occasional rare breakthrough which means that there may be a snore here and there still.  So based on this I would love for her to use the machine at the same settings but more frequently at the end of the long birthday.  Her Epworth score had been endorsed at 10 out of 24 point points before CPAP use and has remained at 10 points.  She is now working nights, and that affects her compliance by sleep time.  Headaches have responded as far as nausea is no longer present. Sharp temple pain is less frequent.  Weight loss from nausea.       Referral from Dr. Jaynee Eagles.  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 .  Seen by Dr Jaynee Eagles for headaches.        Alice Williams has a past medical history of Anal fissure, Anxiety, Breast calcifications on mammogram, Colon polyp, Dyslipidemia (06/24/2018), Elevated cholesterol, Facet arthropathy, lumbosacral (08/28/2017), GERD (gastroesophageal reflux disease), History of colon polyps, IBS (irritable bowel syndrome), Inappropriate sinus tachycardia, Headaches / cluster - starting on Right scalp , first onset in her 59s, Migraines with vomiting , onset at age 78, Osteopenia, Osteoporosis of femur without pathological fracture (08/28/2017), Palpitations, Endometriosis, painful Dysmenorrhea, and Premature surgical menopause.    Sleep relevant medical history: unclear if any hormonal contribution to migraine/ cluster. All her life , she had marked daytime sleepiness. Migraine triggers are bright light and weather changes.    Family medical /sleep history: Mother was chronically non refreshed form sleep, migraines, elder sister as well- other female  family members with  Insomnia and paradox reaction to Benadryl.    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  Family status is divorced.The patient currently works day time shifts, -used to work in shifts( Presenter, broadcasting,)  Tobacco use none now- quit 15 years ago- ETOH use : none , Caffeine intake in form of Coffee( /) Soda( /) Tea ( /) , no energy drinks (), but rarely medication with caffeine. Regular exercise in form of -none    Sleep habits are as follows:  The patient's dinner time is between 2-4 PM, irregular, often skipped. The patient goes to bed at 10.30  PM on a pre work  day, and if not working, her bedtime is 12, once asleep she continues to sleep for 2 hour intervals, has no bathroom breaks, the first time at 1.30 AM, 3 AM, 4 AM.   The preferred sleep position is sideways, with the support of 2 pillows on a flat bed .  Dreams are reportedly frequent. 5.30 AM is the usual rise time. The patient wakes up *with an alarm.   She reports not feeling refreshed or restored in AM, with symptoms such as dry mouth,  morning headaches- eye pain/ left retro-orbital pressure , and residual fatigue. Blurred vision, diplopia.   Naps are taken frequently, lasting from 1-5 h and are not more refreshing than nocturnal sleep.    Review of Systems: Out of a complete 14 system review, the patient complains of only the following symptoms, and all other reviewed systems are negative.:  Fatigue, sleepiness ,  May be snoring, very fragmented sleep,  Insomnia - for the second half of the night:   IBS - nausea.    How likely are you to doze in the following situations: 0 = not likely, 1 = slight chance, 2 = moderate chance, 3 = high chance   Sitting and Reading? Watching Television? Sitting inactive in a public place (theater or meeting)? As a passenger in a car for an hour without a break? Lying down in the afternoon when circumstances permit? Sitting and talking to someone? Sitting quietly after lunch without alcohol? In a car, while stopped for a few minutes in traffic?   Total = 10/ 24 points   FSS endorsed at 56 / 63 points. Never feeling energized.   Social History   Socioeconomic History   Marital status: Divorced    Spouse name: Not on file   Number of children: 1   Years of education: Not on file   Highest education level: Master's degree (e.g., MA, MS, MEng, MEd, MSW, MBA)  Occupational History   Occupation: RN  Tobacco Use   Smoking status: Former    Types: Cigarettes    Quit date: 08/09/2006    Years since quitting: 14.9   Smokeless tobacco: Never   Vaping Use   Vaping Use: Never used  Substance and Sexual Activity   Alcohol use: Not Currently    Comment: rare   Drug use: No   Sexual activity: Never    Birth control/protection: Surgical  Other Topics Concern   Not on file  Social History Narrative   Single, livevs alone in a 2 story house. Drinks two sodas a day. Does not exercise regularly.   Social Determinants of Health   Financial Resource Strain: Not on file  Food Insecurity: Not on file  Transportation Needs: Not on file  Physical Activity: Not on file  Stress: Not on file  Social Connections: Not on file    Family History  Problem Relation Age of Onset   Colon cancer Mother    Breast cancer Mother 41       and liver   Migraines Mother    Hyperlipidemia Father    Alcohol abuse Father    Heart attack Father    Cancer Father        Head and neck    Hyperlipidemia Brother    Lung cancer Maternal Grandmother    Parkinson's disease Maternal Grandfather    Dementia Maternal Grandfather    Prostate cancer Maternal Grandfather    Parkinson's disease Paternal Grandmother    Heart attack Paternal Grandfather    Kidney cancer Sister 20   Migraines Sister    Migraines Sister    Esophageal cancer Neg Hx     Past Medical History:  Diagnosis Date   Anal fissure    Anxiety    Breast calcifications on mammogram    Colon polyp    Dyslipidemia 06/24/2018   Elevated cholesterol    Facet arthropathy, lumbosacral 08/28/2017   GERD (gastroesophageal reflux disease)    History of colon polyps    IBS (irritable bowel syndrome)    Inappropriate sinus tachycardia    Migraines    Osteopenia    Osteoporosis of femur without pathological fracture 08/28/2017   Palpitations    Premature surgical menopause     Past Surgical History:  Procedure Laterality Date   ANAL FISSURE REPAIR     APPENDECTOMY  2000   COLONOSCOPY W/ POLYPECTOMY  03/2015   COLONOSCOPY WITH ESOPHAGOGASTRODUODENOSCOPY (EGD)  04/01/2015   KNEE  SURGERY     OOPHORECTOMY     OOPHORECTOMY     1995, 1998   PELVIC LAPAROSCOPY     x 4 for endometriosis    VAGINAL HYSTERECTOMY  1994     Current Outpatient Medications on File Prior to Visit  Medication Sig Dispense Refill   Atogepant 60 MG TABS Take 60 mg by mouth daily. 16 tablet 0   BOTOX 200 units SOLR INJECT 200 UNITS AS DIRECTED EVERY 3 (THREE) MONTHS. 1 each 3   Calcium Carbonate-Vitamin D 600-400 MG-UNIT tablet Take 1 tablet by mouth 2 (two) times daily. 60 tablet 11   Coenzyme Q10 (CO Q 10 PO) Take by mouth.     denosumab (PROLIA) 60 MG/ML SOSY injection INJECT 60 MG INTO THE SKIN EVERY 6 MONTHS. 180 mL 1   escitalopram (LEXAPRO) 10 MG tablet TAKE 1 TABLET BY MOUTH ONCE DAILY 90 tablet 1   methocarbamol (ROBAXIN) 750 MG tablet TAKE 1 TABLET BY MOUTH EVERY NIGHT AT BEDTIME AS NEEDED FOR HEADACHE 10 tablet 0   nadolol (CORGARD) 40 MG tablet TAKE 1 TABLET (40 MG TOTAL) BY MOUTH DAILY. 90 tablet 3   ondansetron (ZOFRAN ODT) 4 MG disintegrating tablet Take 1 tablet (4 mg total) by mouth every 6 (six) hours as needed for nausea or vomiting. 30 tablet 3   pantoprazole (PROTONIX) 40 MG tablet TAKE 1 TABLET BY MOUTH ONCE DAILY 30 tablet 6   polycarbophil (FIBERCON) 625 MG tablet Take 625 mg by mouth daily.     Riboflavin 400 MG CAPS Take 1 capsule by mouth daily.     Topiramate ER (TROKENDI XR) 200 MG CP24 TAKE ONE CAPSULE (200 MG DOSE) BY MOUTH DAILY. 30 capsule 11   traZODone (DESYREL) 50 MG tablet TAKE 1/2 - 1 TABLET BY MOUTH EVERY NIGHT AT BEDTIME  AS NEEDED FOR SLEEP 30 tablet 3   dicyclomine (BENTYL) 10 MG capsule TAKE 1 CAPSULE BY MOUTH TWO TIMES DAILY 1/2 HOUR BEFORE A MEAL AND AT BEDTIME 60 capsule 6   No current facility-administered medications on file prior to visit.    Allergies  Allergen Reactions   Sulfa Antibiotics Anaphylaxis   Sumatriptan Succinate Anaphylaxis   Emgality [Galcanezumab-Gnlm] Nausea Only    GI upset - diarrhea, nausea    Physical exam:  Today's  Vitals   08/01/21 0908  BP: 124/78  Pulse: 63  SpO2: 99%  Weight: 134 lb (60.8 kg)  Height: 5\' 7"  (1.702 m)   Body mass index is 20.99 kg/m.   Wt Readings from Last 3 Encounters:  08/01/21 134 lb (60.8 kg)  06/12/21 131 lb (59.4 kg)  02/16/21 140 lb (63.5 kg)     Ht Readings from Last 3 Encounters:  08/01/21 5\' 7"  (1.702 m)  05/03/21 5\' 7"  (1.702 m)  02/16/21 5\' 7"  (1.702 m)      General: The patient is awake, alert and appears not in acute distress.  The patient is well groomed. Head: Normocephalic, atraumatic. Neck is supple. Mallampati 2, deviated to the left.  neck circumference: 13 inches . Small mouth, Nasal airflow patent.  Retrognathia is seen. Hypoplastic chin, crowded teeth-  8 teeth were removed and she wore braces.  Dental status: biological  Cardiovascular:  Regular rate and cardiac rhythm by pulse,  without distended neck veins. Respiratory: Lungs are clear to auscultation.  Skin:  Without evidence of ankle edema, or rash. Trunk: The patient's posture is erect.   Neurologic exam : The patient is awake and alert, oriented to place and time.   Memory subjective described as intact.  Attention span & concentration ability appears normal.  Speech is fluent,  without  dysarthria, dysphonia or aphasia.  Mood and affect are appropriate.   Cranial nerves: no loss of smell or taste reported  Pupils are equal and briskly reactive to light. Funduscopic exam deferred. .  Extraocular movements in vertical and horizontal planes were intact and without nystagmus. No Diplopia. Visual fields by finger perimetry are intact. Hearing was intact to soft voice and finger rubbing.    Facial sensation intact to fine touch, slightly decreased in left midface.  Facial motor strength is asymmetric - the left ankle of the mouth is lower . But tongue and uvula both move slightly left ward .  Neck ROM : rotation, tilt and flexion extension were normal for age and shoulder shrug was  symmetrical.    Motor exam:  Symmetric bulk, tone and ROM.   Normal tone without cog-wheeling, symmetrically reduced grip strength - a slow development. .   Sensory:  deferred   Coordination: Rapid alternating movements in the fingers/hands were of normal speed.  The Finger-to-nose maneuver was intact without evidence of ataxia  or tremor.   Gait and station: Patient could rise unassisted from a seated position, walked without assistive device.   Toe and heel walk were deferred.  Deep tendon reflexes: in the  upper and lower extremities are symmetric and intact.  Babinski response was deferred.      After spending a total time of 20  minutes face to face and additional time for physical and neurologic examination, review of laboratory studies,  personal review of imaging studies, reports and results of other testing and review of referral information / records as far as provided in visit, I have established the following assessments:  1) Cluster and migraine headache history, waking up with retro-orbital pressure, sometimes Diplopia, and allways fatigued.  Sleep related headaches are very common.   2) Insomnia in the second half of the night- could be related to dream sleep, REM sleep .  2b) REM dependent OSA , with  higher risk due to hypoplastic jaw.    3) nightshift work.    My Plan is to proceed with:  1) increase trazodone to  25-50 mg hs. Continue CPAP use.  Caveat for sleep tech: PLEASE remember to not put a FFM on this type of patient.   I would like to thank Dr Jaynee Eagles  for allowing me to meet with and to take care of this pleasant patient.   In short, Alice Williams is presenting with OSA, shift work, poor sleep and sleep related headaches and Insomnia.  I plan to follow up either personally or through our NP within 12 month.   CC: I will share my notes with Dr Jaynee Eagles.  Electronically signed by: Larey Seat, MD 08/01/2021 9:44 AM  [facility] and  [facility] Board certified by The [employer] of Sleep Medicine and Diplomate of the [employer] of Sleep Medicine. Board certified In Neurology through the [employer] [place], Fellow of the [employer] of Neurology. Medical Director of [employer].

## 2021-08-01 NOTE — Patient Instructions (Signed)
Quality Sleep Information, Adult Quality sleep is important for your mental and physical health. It also improves your quality of life. Quality sleep means you: Are asleep for most of the time you are in bed. Fall asleep within 30 minutes. Wake up no more than once a night.  Are awake for no longer than 20 minutes if you do wake up during the night. Most adults need 7-8 hours of quality sleep each night. How can poor sleep affect me? If you do not get enough quality sleep, you may have: Mood swings. Daytime sleepiness. Confusion. Decreased reaction time. Sleep disorders, such as insomnia and sleep apnea. Difficulty with: Solving problems. Coping with stress. Paying attention. These issues may affect your performance and productivity at work, school, and at home. Lack of sleep may also put you at higher risk for accidents, suicide, and risky behaviors. If you do not get quality sleep you may also be at higher risk for several health problems, including: Infections. Type 2 diabetes. Heart disease. High blood pressure. Obesity. Worsening of long-term conditions, like arthritis, kidney disease, depression, Parkinson's disease, and epilepsy. What actions can I take to get more quality sleep?   Stick to a sleep schedule. Go to sleep and wake up at about the same time each day. Do not try to sleep less on weekdays and make up for lost sleep on weekends. This does not work. Try to get about 30 minutes of exercise on most days. Do not exercise 2-3 hours before going to bed. Limit naps during the day to 30 minutes or less. Do not use any products that contain nicotine or tobacco, such as cigarettes or e-cigarettes. If you need help quitting, ask your health care provider. Do not drink caffeinated beverages for at least 8 hours before going to bed. Coffee, tea, and some sodas contain caffeine. Do not drink alcohol close to bedtime. Do not eat large meals close to bedtime. Do not take naps in  the late afternoon. Try to get at least 30 minutes of sunlight every day. Morning sunlight is best. Make time to relax before bed. Reading, listening to music, or taking a hot bath promotes quality sleep. Make your bedroom a place that promotes quality sleep. Keep your bedroom dark, quiet, and at a comfortable room temperature. Make sure your bed is comfortable. Take out sleep distractions like TV, a computer, smartphone, and bright lights. If you are lying awake in bed for longer than 20 minutes, get up and do a relaxing activity until you feel sleepy. Work with your health care provider to treat medical conditions that may affect sleeping, such as: Nasal obstruction. Snoring. Sleep apnea and other sleep disorders. Talk to your health care provider if you think any of your prescription medicines may cause you to have difficulty falling or staying asleep. If you have sleep problems, talk with a sleep consultant. If you think you have a sleep disorder, talk with your health care provider about getting evaluated by a specialist. Where to find more information National Sleep Foundation website: https://sleepfoundation.org National Heart, Lung, and Blood Institute (NHLBI): www.nhlbi.nih.gov/files/docs/public/sleep/healthy_sleep.pdf Centers for Disease Control and Prevention (CDC): www.cdc.gov/sleep/index.html Contact a health care provider if you: Have trouble getting to sleep or staying asleep. Often wake up very early in the morning and cannot get back to sleep. Have daytime sleepiness. Have daytime sleep attacks of suddenly falling asleep and sudden muscle weakness (narcolepsy). Have a tingling sensation in your legs with a strong urge to move your legs (restless   legs syndrome). Stop breathing briefly during sleep (sleep apnea). Think you have a sleep disorder or are taking a medicine that is affecting your quality of sleep. Summary Most adults need 7-8 hours of quality sleep each  night. Getting enough quality sleep is an important part of health and well-being. Make your bedroom a place that promotes quality sleep and avoid things that may cause you to have poor sleep, such as alcohol, caffeine, smoking, and large meals. Talk to your health care provider if you have trouble falling asleep or staying asleep. This information is not intended to replace advice given to you by your health care provider. Make sure you discuss any questions you have with your health care provider. Document Revised: 01/22/2018 Document Reviewed: 01/22/2018 Elsevier Patient Education  2022 [employer]. Insomnia Insomnia is a sleep disorder that makes it difficult to fall asleep or stay asleep. Insomnia can cause fatigue, low energy, difficulty concentrating, mood swings, and poor performance at work or school. There are three different ways to classify insomnia: Difficulty falling asleep. Difficulty staying asleep. Waking up too early in the morning. Any type of insomnia can be long-term (chronic) or short-term (acute). Both are common. Short-term insomnia usually lasts for three months or less. Chronic insomnia occurs at least three times a week for longer than three months. What are the causes? Insomnia may be caused by another condition, situation, or substance, such as: Anxiety. Certain medicines. Gastroesophageal reflux disease (GERD) or other gastrointestinal conditions. Asthma or other breathing conditions. Restless legs syndrome, sleep apnea, or other sleep disorders. Chronic pain. Menopause. Stroke. Abuse of alcohol, tobacco, or illegal drugs. Mental health conditions, such as depression. Caffeine. Neurological disorders, such as Alzheimer's disease. An overactive thyroid (hyperthyroidism). Sometimes, the cause of insomnia may not be known. What increases the risk? Risk factors for insomnia include: Gender. Women are affected more often than men. Age. Insomnia is more  common as you get older. Stress. Lack of exercise. Irregular work schedule or working night shifts. Traveling between different time zones. Certain medical and mental health conditions. What are the signs or symptoms? If you have insomnia, the main symptom is having trouble falling asleep or having trouble staying asleep. This may lead to other symptoms, such as: Feeling fatigued or having low energy. Feeling nervous about going to sleep. Not feeling rested in the morning. Having trouble concentrating. Feeling irritable, anxious, or depressed. How is this diagnosed? This condition may be diagnosed based on: Your symptoms and medical history. Your health care provider may ask about: Your sleep habits. Any medical conditions you have. Your mental health. A physical exam. How is this treated? Treatment for insomnia depends on the cause. Treatment may focus on treating an underlying condition that is causing insomnia. Treatment may also include: Medicines to help you sleep. Counseling or therapy. Lifestyle adjustments to help you sleep better. Follow these instructions at home: Eating and drinking  Limit or avoid alcohol, caffeinated beverages, and cigarettes, especially close to bedtime. These can disrupt your sleep. Do not eat a large meal or eat spicy foods right before bedtime. This can lead to digestive discomfort that can make it hard for you to sleep. Sleep habits  Keep a sleep diary to help you and your health care provider figure out what could be causing your insomnia. Write down: When you sleep. When you wake up during the night. How well you sleep. How rested you feel the next day. Any side effects of medicines you are taking. What you  eat and drink. Make your bedroom a dark, comfortable place where it is easy to fall asleep. Put up shades or blackout curtains to block light from outside. Use a white noise machine to block noise. Keep the temperature cool. Limit  screen use before bedtime. This includes: Watching TV. Using your smartphone, tablet, or computer. Stick to a routine that includes going to bed and waking up at the same times every day and night. This can help you fall asleep faster. Consider making a quiet activity, such as reading, part of your nighttime routine. Try to avoid taking naps during the day so that you sleep better at night. Get out of bed if you are still awake after 15 minutes of trying to sleep. Keep the lights down, but try reading or doing a quiet activity. When you feel sleepy, go back to bed. General instructions Take over-the-counter and prescription medicines only as told by your health care provider. Exercise regularly, as told by your health care provider. Avoid exercise starting several hours before bedtime. Use relaxation techniques to manage stress. Ask your health care provider to suggest some techniques that may work well for you. These may include: Breathing exercises. Routines to release muscle tension. Visualizing peaceful scenes. Make sure that you drive carefully. Avoid driving if you feel very sleepy. Keep all follow-up visits as told by your health care provider. This is important. Contact a health care provider if: You are tired throughout the day. You have trouble in your daily routine due to sleepiness. You continue to have sleep problems, or your sleep problems get worse. Get help right away if: You have serious thoughts about hurting yourself or someone else. If you ever feel like you may hurt yourself or others, or have thoughts about taking your own life, get help right away. You can go to your nearest emergency department or call: Your local emergency services (911 in the [place] A suicide crisis helpline, such as the [place] at 901-321-5646. This is open 24 hours a day. Summary Insomnia is a sleep disorder that makes it difficult to fall asleep or stay  asleep. Insomnia can be long-term (chronic) or short-term (acute). Treatment for insomnia depends on the cause. Treatment may focus on treating an underlying condition that is causing insomnia. Keep a sleep diary to help you and your health care provider figure out what could be causing your insomnia. This information is not intended to replace advice given to you by your health care provider. Make sure you discuss any questions you have with your health care provider. Document Revised: 08/25/2020 Document Reviewed: 08/25/2020 Elsevier Patient Education  2022 [employer].

## 2021-08-03 ENCOUNTER — Telehealth: Payer: Self-pay | Admitting: Neurology

## 2021-08-03 NOTE — Telephone Encounter (Signed)
[facility]) called, inquire if PA has been initiated for [place]. Would like a call back.

## 2021-08-07 NOTE — Telephone Encounter (Signed)
Dr. Brett Fairy follows pt for sleep, Dr. Jaynee Eagles for migraines

## 2021-08-17 NOTE — Telephone Encounter (Signed)
Lenoria Chime PA completed on Cover My Meds. Key: [id number]. Awaiting determination from [place].

## 2021-08-23 NOTE — Telephone Encounter (Signed)
This was denied by insurance.

## 2021-08-23 NOTE — Telephone Encounter (Signed)
Spoke with Marissa @ [employer] and she transferred me to the insurance specialist who provided fax number for [place] to send denial letter. Pt may be eligible for discount program since insurance doesn't approve. Letter faxed to [facility] [phone]. Received a receipt of confirmation.

## 2021-08-23 NOTE — Telephone Encounter (Signed)
Alice Williams with [employer] called stating a PA is needed for the pt to receive the [place].

## 2021-08-23 NOTE — Telephone Encounter (Signed)
Deniedon October 24  This request has not been approved. Based on the information submitted for review, you did not meet our guideline rules for the requested drug. In order for your request to be approved, your provider would need to show that you have met the guideline rules below. The details below are written in medical language. If you have questions, please contact your provider. In some cases, the requested medication or alternatives offered may have additional approval requirements. The guideline named ATOGEPANT (generic for Qulipta) requires that the request is for the preventative treatment of episodic migraines, and you will NOT use Qulipta concurrently (at the same time) with other calcitonin gene-related peptide (CGRP) inhibitors (such as Ajovy, Aimovig, Emgality, Vyepti, Nurtec ODT) for migraine prevention.. Your doctor told us you have chronic migraines. This request was denied because your doctor did not tell us that you have met these requirements. Please work with your doctor to use a different medication or get [place] more information if it will allow [place] to approve this request. A written notification letter will follow with additional details.  Patient cannot be on Vyepti and Qulipta at the same time.   If appeal desired, fax to [phone] within 180 days.

## 2021-08-24 ENCOUNTER — Other Ambulatory Visit (HOSPITAL_BASED_OUTPATIENT_CLINIC_OR_DEPARTMENT_OTHER): Payer: Self-pay

## 2021-08-28 NOTE — Telephone Encounter (Signed)
Alice Williams with [facility] called stating he received the denial letter requesting a call back to go over the missing steps. [phone].

## 2021-08-29 ENCOUNTER — Encounter: Payer: Self-pay | Admitting: *Deleted

## 2021-08-29 NOTE — Telephone Encounter (Signed)
Returned Derk's call. LVM with office number for call back.

## 2021-09-05 ENCOUNTER — Other Ambulatory Visit: Payer: Self-pay | Admitting: Medical

## 2021-09-05 DIAGNOSIS — Z1231 Encounter for screening mammogram for malignant neoplasm of breast: Secondary | ICD-10-CM

## 2021-09-06 NOTE — Progress Notes (Signed)
HPI: FU dyspnea and palpitations. Tilt table in 2016 felt consistent with neurocardiogenic syndrome. By report patient had a monitor May 2016 that showed no significant arrhythmia but did have sinus tachycardia. Echocardiogram April 2021 showed normal LV function. Cardiac CTA April 2021 showed no coronary disease and calcium score of 0. Since last seen, she has dyspnea with more vigorous activities but not routine activities.  No orthopnea, PND, pedal edema, exertional chest pain or syncope.  She continues to have palpitations described as a brief flutter not sustained.  Current Outpatient Medications  Medication Sig Dispense Refill   Atogepant 60 MG TABS Take 60 mg by mouth daily. 16 tablet 0   Calcium Carbonate-Vitamin D 600-400 MG-UNIT tablet Take 1 tablet by mouth 2 (two) times daily. 60 tablet 11   Coenzyme Q10 (CO Q 10 PO) Take by mouth.     denosumab (PROLIA) 60 MG/ML SOSY injection INJECT 60 MG INTO THE SKIN EVERY 6 MONTHS. 180 mL 1   dicyclomine (BENTYL) 10 MG capsule TAKE 1 CAPSULE BY MOUTH TWO TIMES DAILY 1/2 HOUR BEFORE A MEAL AND AT BEDTIME 60 capsule 6   escitalopram (LEXAPRO) 10 MG tablet TAKE 1 TABLET BY MOUTH ONCE DAILY 90 tablet 1   methocarbamol (ROBAXIN) 750 MG tablet TAKE 1 TABLET BY MOUTH EVERY NIGHT AT BEDTIME AS NEEDED FOR HEADACHE 10 tablet 0   nadolol (CORGARD) 40 MG tablet TAKE 1 TABLET (40 MG TOTAL) BY MOUTH DAILY. 90 tablet 3   ondansetron (ZOFRAN ODT) 4 MG disintegrating tablet Take 1 tablet (4 mg total) by mouth every 6 (six) hours as needed for nausea or vomiting. 30 tablet 3   pantoprazole (PROTONIX) 40 MG tablet TAKE 1 TABLET BY MOUTH ONCE DAILY 30 tablet 6   polycarbophil (FIBERCON) 625 MG tablet Take 625 mg by mouth daily.     Riboflavin 400 MG CAPS Take 1 capsule by mouth daily.     Topiramate ER (TROKENDI XR) 200 MG CP24 TAKE ONE CAPSULE (200 MG DOSE) BY MOUTH DAILY. 30 capsule 11   traZODone (DESYREL) 50 MG tablet TAKE 1/2 - 1 TABLET BY MOUTH EVERY  NIGHT AT BEDTIME AS NEEDED FOR SLEEP 30 tablet 3   No current facility-administered medications for this visit.     Past Medical History:  Diagnosis Date   Anal fissure    Anxiety    Breast calcifications on mammogram    Colon polyp    Dyslipidemia 06/24/2018   Elevated cholesterol    Facet arthropathy, lumbosacral 08/28/2017   GERD (gastroesophageal reflux disease)    History of colon polyps    IBS (irritable bowel syndrome)    Inappropriate sinus tachycardia    Migraines    Osteopenia    Osteoporosis of femur without pathological fracture 08/28/2017   Palpitations    Premature surgical menopause     Past Surgical History:  Procedure Laterality Date   ANAL FISSURE REPAIR     APPENDECTOMY  2000   COLONOSCOPY W/ POLYPECTOMY  03/2015   COLONOSCOPY WITH ESOPHAGOGASTRODUODENOSCOPY (EGD)  04/01/2015   KNEE SURGERY     OOPHORECTOMY     OOPHORECTOMY     1995, 1998   PELVIC LAPAROSCOPY     x 4 for endometriosis    VAGINAL HYSTERECTOMY  1994    Social History   Socioeconomic History   Marital status: Divorced    Spouse name: Not on file   Number of children: 1   Years of education: Not on file  Highest education level: Master's degree (e.g., MA, MS, MEng, MEd, MSW, MBA)  Occupational History   Occupation: RN  Tobacco Use   Smoking status: Former    Types: Cigarettes    Quit date: 08/09/2006    Years since quitting: 15.1   Smokeless tobacco: Never  Vaping Use   Vaping Use: Never used  Substance and Sexual Activity   Alcohol use: Not Currently    Comment: rare   Drug use: No   Sexual activity: Never    Birth control/protection: Surgical  Other Topics Concern   Not on file  Social History Narrative   Single, livevs alone in a 2 story house. Drinks two sodas a day. Does not exercise regularly.   Social Determinants of Health   Financial Resource Strain: Not on file  Food Insecurity: Not on file  Transportation Needs: Not on file  Physical Activity: Not on  file  Stress: Not on file  Social Connections: Not on file  Intimate Partner Violence: Not on file    Family History  Problem Relation Age of Onset   Colon cancer Mother    Breast cancer Mother 20       and liver   Migraines Mother    Hyperlipidemia Father    Alcohol abuse Father    Heart attack Father    Cancer Father        Head and neck    Hyperlipidemia Brother    Lung cancer Maternal Grandmother    Parkinson's disease Maternal Grandfather    Dementia Maternal Grandfather    Prostate cancer Maternal Grandfather    Parkinson's disease Paternal Grandmother    Heart attack Paternal Grandfather    Kidney cancer Sister 89   Migraines Sister    Migraines Sister    Esophageal cancer Neg Hx     ROS: no fevers or chills, productive cough, hemoptysis, dysphasia, odynophagia, melena, hematochezia, dysuria, hematuria, rash, seizure activity, orthopnea, PND, pedal edema, claudication. Remaining systems are negative.  Physical Exam: Well-developed well-nourished in no acute distress.  Skin is warm and dry.  HEENT is normal.  Neck is supple.  Chest is clear to auscultation with normal expansion.  Cardiovascular exam is regular rate and rhythm.  Abdominal exam nontender or distended. No masses palpated. Extremities show no edema. neuro grossly intact  ECG-normal sinus rhythm at a rate of 72, no ST changes.  Personally reviewed  A/P  1 dyspnea-as outlined previously echocardiogram showed normal LV function and CTA showed no coronary disease.  No plans for further cardiac evaluation at this point.  2 palpitations-symptoms are reasonably well controlled.  Continue beta-blocker at present dose.  3 hyperlipidemia-managed by primary care.  Kirk Ruths, MD

## 2021-09-13 ENCOUNTER — Other Ambulatory Visit (HOSPITAL_COMMUNITY): Payer: Self-pay

## 2021-09-13 ENCOUNTER — Other Ambulatory Visit: Payer: Self-pay

## 2021-09-13 ENCOUNTER — Encounter: Payer: Self-pay | Admitting: Cardiology

## 2021-09-13 ENCOUNTER — Other Ambulatory Visit (HOSPITAL_BASED_OUTPATIENT_CLINIC_OR_DEPARTMENT_OTHER): Payer: Self-pay

## 2021-09-13 ENCOUNTER — Ambulatory Visit (INDEPENDENT_AMBULATORY_CARE_PROVIDER_SITE_OTHER): Payer: No Typology Code available for payment source | Admitting: Cardiology

## 2021-09-13 VITALS — BP 116/76 | HR 70 | Ht 67.0 in | Wt 133.4 lb

## 2021-09-13 DIAGNOSIS — I471 Supraventricular tachycardia: Secondary | ICD-10-CM | POA: Diagnosis not present

## 2021-09-13 DIAGNOSIS — R002 Palpitations: Secondary | ICD-10-CM | POA: Diagnosis not present

## 2021-09-13 MED ORDER — NADOLOL 40 MG PO TABS
40.0000 mg | ORAL_TABLET | Freq: Every day | ORAL | 3 refills | Status: DC
  Filled 2021-09-13 – 2021-10-09 (×2): qty 90, 90d supply, fill #0
  Filled 2022-01-29: qty 90, 90d supply, fill #1
  Filled 2022-05-14: qty 87, 87d supply, fill #2
  Filled 2022-05-14: qty 3, 3d supply, fill #2

## 2021-09-13 NOTE — Patient Instructions (Signed)

## 2021-09-14 ENCOUNTER — Other Ambulatory Visit (HOSPITAL_BASED_OUTPATIENT_CLINIC_OR_DEPARTMENT_OTHER): Payer: Self-pay

## 2021-09-18 ENCOUNTER — Telehealth: Payer: Self-pay | Admitting: Neurology

## 2021-09-18 ENCOUNTER — Telehealth: Payer: Self-pay | Admitting: *Deleted

## 2021-09-18 ENCOUNTER — Other Ambulatory Visit: Payer: Self-pay

## 2021-09-18 ENCOUNTER — Encounter (HOSPITAL_COMMUNITY)
Admission: RE | Admit: 2021-09-18 | Discharge: 2021-09-18 | Disposition: A | Discharge status: Home / Self Care | Payer: No Typology Code available for payment source | Source: Ambulatory Visit | Attending: Neurology | Admitting: Neurology

## 2021-09-18 DIAGNOSIS — G43809 Other migraine, not intractable, without status migrainosus: Secondary | ICD-10-CM | POA: Insufficient documentation

## 2021-09-18 MED ORDER — SODIUM CHLORIDE 0.9 % IV SOLN
100.0000 mg | Freq: Once | INTRAVENOUS | Status: AC
  Administered 2021-09-18: 100 mg via INTRAVENOUS
  Filled 2021-09-18: qty 1

## 2021-09-18 NOTE — Telephone Encounter (Signed)
Pt called stating there is no PA for infusion. Pt requesting a call back.

## 2021-09-18 NOTE — Telephone Encounter (Signed)
I called pt and let her know that awaiting determination on the infusion PA.  Will let her know via mchart when receive answer.  She verbalized understanding.

## 2021-09-18 NOTE — Telephone Encounter (Signed)
I called pt and she is at the facility awaiting her infusion for vyepti.  She is [employer] employee and needs PA for this.  We have approval from [employer] for medication 05-12-21 thru 11-11-21, need PA for infusion site.  I have no CPT, Tax ID.  I have spoke to [employer] x 2, help desk pharmacy help desk x2.  Spoke with Sharyn Lull with [employer] re: PA for infusion Vyepti on this pt.  Will be able to also do retro for 06-12-2021 infusion.  Clinical notes faxed to [phone] ref # [id number].  Determination pending  I asked for expediate.

## 2021-09-25 ENCOUNTER — Other Ambulatory Visit (HOSPITAL_BASED_OUTPATIENT_CLINIC_OR_DEPARTMENT_OTHER): Payer: Self-pay

## 2021-09-26 ENCOUNTER — Other Ambulatory Visit: Payer: Self-pay

## 2021-09-26 ENCOUNTER — Ambulatory Visit
Admission: RE | Admit: 2021-09-26 | Discharge: 2021-09-26 | Disposition: A | Discharge status: Home / Self Care | Payer: No Typology Code available for payment source | Source: Ambulatory Visit

## 2021-09-26 DIAGNOSIS — Z1231 Encounter for screening mammogram for malignant neoplasm of breast: Secondary | ICD-10-CM

## 2021-09-27 ENCOUNTER — Other Ambulatory Visit: Payer: Self-pay | Admitting: Physician Assistant

## 2021-09-27 ENCOUNTER — Other Ambulatory Visit (HOSPITAL_BASED_OUTPATIENT_CLINIC_OR_DEPARTMENT_OTHER): Payer: Self-pay

## 2021-09-28 ENCOUNTER — Other Ambulatory Visit: Payer: Self-pay | Admitting: *Deleted

## 2021-09-28 ENCOUNTER — Other Ambulatory Visit (HOSPITAL_BASED_OUTPATIENT_CLINIC_OR_DEPARTMENT_OTHER): Payer: Self-pay

## 2021-09-28 ENCOUNTER — Other Ambulatory Visit (HOSPITAL_COMMUNITY): Payer: Self-pay

## 2021-09-28 MED ORDER — BOTOX 200 UNITS IJ SOLR
INTRAMUSCULAR | 3 refills | Status: DC
  Filled 2021-09-28: qty 1, 90d supply, fill #0

## 2021-09-28 MED ORDER — BOTOX 200 UNITS IJ SOLR
200.0000 [IU] | INTRAMUSCULAR | 1 refills | Status: DC
  Filled 2021-09-28 (×2): qty 1, 90d supply, fill #0
  Filled 2021-12-18: qty 1, 90d supply, fill #1

## 2021-09-29 ENCOUNTER — Encounter: Payer: Self-pay | Admitting: Physician Assistant

## 2021-09-29 ENCOUNTER — Ambulatory Visit (INDEPENDENT_AMBULATORY_CARE_PROVIDER_SITE_OTHER): Payer: No Typology Code available for payment source | Admitting: Physician Assistant

## 2021-09-29 ENCOUNTER — Other Ambulatory Visit: Payer: Self-pay

## 2021-09-29 VITALS — BP 123/79 | HR 58 | Wt 136.0 lb

## 2021-09-29 DIAGNOSIS — M62838 Other muscle spasm: Secondary | ICD-10-CM | POA: Diagnosis not present

## 2021-09-29 DIAGNOSIS — G43709 Chronic migraine without aura, not intractable, without status migrainosus: Secondary | ICD-10-CM

## 2021-09-29 MED ORDER — ONABOTULINUMTOXINA 100 UNITS IJ SOLR
200.0000 [IU] | Freq: Once | INTRAMUSCULAR | Status: AC
  Administered 2021-09-29: 200 [IU] via INTRAMUSCULAR

## 2021-09-29 NOTE — Progress Notes (Signed)
S: Pt in office today for Botox injections. Her Botox has been workingwell for migraine prevention. She has not received in 4 months and notes it has worn off to some degree.     O: BP 123/79   Pulse (!) 58   Wt 136 lb (61.7 kg)   BMI 21.30 kg/m  Significant muscle tension on the left trapezius and upper back.  Also with eye twitch on left.  Botox Procedure Note Vial of Botox was : Supplied by Patient   Botox Dosing by [employer] for Chronic Migraine   Injection Sites for Migraines  Botox 155 units was injected using the dosage in the table above in the pattern shown above.Units wasted 45  A: Migraine  Muscle spasm   P: Botox 155 units injected today.  RTC 3 Months.

## 2021-10-06 ENCOUNTER — Other Ambulatory Visit (HOSPITAL_BASED_OUTPATIENT_CLINIC_OR_DEPARTMENT_OTHER): Payer: Self-pay

## 2021-10-09 ENCOUNTER — Other Ambulatory Visit (HOSPITAL_BASED_OUTPATIENT_CLINIC_OR_DEPARTMENT_OTHER): Payer: Self-pay

## 2021-10-13 ENCOUNTER — Other Ambulatory Visit (HOSPITAL_BASED_OUTPATIENT_CLINIC_OR_DEPARTMENT_OTHER): Payer: Self-pay

## 2021-10-31 ENCOUNTER — Other Ambulatory Visit (HOSPITAL_COMMUNITY): Payer: Self-pay

## 2021-11-01 ENCOUNTER — Other Ambulatory Visit (HOSPITAL_COMMUNITY): Payer: Self-pay

## 2021-11-07 ENCOUNTER — Ambulatory Visit: Payer: No Typology Code available for payment source | Admitting: Medical

## 2021-11-08 ENCOUNTER — Other Ambulatory Visit (HOSPITAL_BASED_OUTPATIENT_CLINIC_OR_DEPARTMENT_OTHER): Payer: Self-pay

## 2021-11-08 ENCOUNTER — Telehealth: Payer: Self-pay | Admitting: Neurology

## 2021-11-08 ENCOUNTER — Telehealth (INDEPENDENT_AMBULATORY_CARE_PROVIDER_SITE_OTHER): Payer: No Typology Code available for payment source | Admitting: Neurology

## 2021-11-08 DIAGNOSIS — G43711 Chronic migraine without aura, intractable, with status migrainosus: Secondary | ICD-10-CM | POA: Diagnosis not present

## 2021-11-08 DIAGNOSIS — G245 Blepharospasm: Secondary | ICD-10-CM | POA: Diagnosis not present

## 2021-11-08 MED ORDER — TROKENDI XR 200 MG PO CP24
200.0000 mg | ORAL_CAPSULE | Freq: Every day | ORAL | 3 refills | Status: DC
  Filled 2021-11-08 – 2021-12-06 (×2): qty 90, 90d supply, fill #0
  Filled 2022-03-11 – 2022-03-13 (×2): qty 90, 90d supply, fill #1
  Filled 2022-06-07: qty 90, 90d supply, fill #2

## 2021-11-08 MED ORDER — ATOGEPANT 60 MG PO TABS: 60.0000 mg | ORAL_TABLET | Freq: Every day | ORAL | 6 refills | Status: DC

## 2021-11-08 NOTE — Progress Notes (Signed)
[facility]    Provider:  Dr Alice Williams Requesting Provider: Elise Williams Primary Care Provider:  Mackie Pai, PA-C  CC:  Chronic migraines  Virtual Visit via Video Note  I connected with Alice Williams on 11/08/21 at  9:30 AM EST by a video enabled telemedicine application and verified that I am speaking with the correct person using two identifiers.  Location: Patient: home Provider: office   I discussed the limitations of evaluation and management by telemedicine and the availability of in person appointments. The patient expressed understanding and agreed to proceed.  Follow Up Instructions:    I discussed the assessment and treatment plan with the patient. The patient was provided an opportunity to ask questions and all were answered. The patient agreed with the plan and demonstrated an understanding of the instructions.   The patient was advised to call back or seek an in-person evaluation if the symptoms worsen or if the condition fails to improve as anticipated.  I provided 55 minutes of non-face-to-face time during this encounter.  This included previsit chart review, lab review, study review, order entry, electronic health record documentation, imaging review, patient education on the different diagnostic and therapeutic options, counseling and coordination of care, risks and benefits of management, compliance, or risk factor reduction    Alice Beam, MD   11/08/2021: Sleep study results: MPRESSION: This HST confirmed the presence of mild -moderate OSA (obstructive sleep apnea) without hypoxemia and with Strong REM sleep dependence. Mild snoring is indicated by RDI. Intermittent Bradycardia was noted. RECOMMENDATION: In a patient with sleep related headache, treatment of even mild sleep apnea is indicated. REM dependent apnea needs PAP therapy to be alleviated. The patient has retrognathia and should avoid a FFM. I recommend use of auto CPAP,  with a small interface ( nasal pillow, nasal mask), and heated humidification. Settings to be: 4-14 cm water, 2 cm EPR. She takes excedrin migraine 4x a month. She is also on Alice Williams. Als on Topamax, nadolol. Not having daily headaches, tremendous improvement. She has blepharospasm. She gets botox elsewhere, discuss with her for blepharospasm.   CPAP is helping.   Migraines are improved. Still having some occ. sharp pains brief. Botox is helping. Having 3-4 moderate to severe migraine days a month.  Baseline was headaches every day, at least 15 migraine days a month. Improved by 75% also the infusion every 3 months is helping.   HPI:  Alice Williams is a 61 y.o. female here as requested by Alice Pai, PA-C for chronic migraines since childhood, been treated in the past by Dr. Tomi Likens and with [facility], the [facility] headache center, [facility] neurology and [facility] neurology. She is currently a patient with a headache specialist at [facility] and had her last botox in December of 2021.   She has had migraines since the age of 26th, they started on the right and then the left, when she was [age] she separated from her husband, she started having nausea and vomiting, the eye itself would shake, nystagmus, there is pain behind the left eye, the whole side of her left face, she will have pain in the temples, gets numb left side of the face, migraines lately radiate to the back of the head and they go all over, pulsating/pounding/throbbing, last week and this week has been awful, zofran doesn't help, she has diarrhea. She has had multiple tests and they have been normal. The left-sided facial changes numbness in the left side  of the face is new. She quit doing the emgality because the GI doctor thought it may be causing the nausea and vomiting, stopped it and nothng has changed. She tried all three of the injections, none helped. The botox has helped, it works for 6 weeks,  the last month she start getting migraines again. She never had light sensitivity and now she gets that too, she gets ringing in the ears, smells trigger, ongoing for 5-6 years, she is having difficulty with speech and getting words to come out that is new. She has associated dizziness, and she rolled over in bed and the light came across and blinded her, everything was spinning, she is clumsy and in the last year feeling she is more clumsy, everything is "off", and then she feels when she falls over she feels imbalance. She has neck pain all the time. Speech changes with the migraine have been ongoing for years, aphasia with the migraines. She has headaches every day, migraines can last 2-3 days, severe migraines 5-6 a month and moderateratly severe 10 migraine days a month. Botox has reduced the migraine burden by 50% but she still have 1/2 month of migraines prior was daily migraines. Nurtec helped for a while and not so much lately. Roselyn Meier did not work. She has positional headaches, head posture can often exacerbate, movement makes it worse, bending down can worsen headache. Reports arm weakness.   Reviewed notes, labs and imaging from outside physicians, which showed:  12/13/2020: sed rate, cbc and cmp nml,   CT head 12/20/2020 showed No acute intracranial abnormalities including mass lesion or mass effect, hydrocephalus, extra-axial fluid collection, midline shift, hemorrhage, or acute infarction, large ischemic events (personally reviewed images). Normal.   I reviewed neurology notes from [facility], appears patient was seeing the headache center in [place] regularly, I reviewed those notes, she was also receiving Botox regularly for over a year, she has had migraines since a child, pain is stabbing throbbing pulsating, and bandlike, associated symptoms include dizziness, nausea, numbness, phonophobia and vomiting, no vision changes, photophobia, rhinorrhea or visual changes.  Aggravated by  stress.  Located in the bilateral, frontal, occipital, retro-orbital temporal and parietal regions.  She is also been followed by low [facility] neurology Dr. Tomi Likens.  She did have Botox with Dr. Tomi Likens as well in 2019.  Last Botox at the [facility] appears to have been in June 2021.  She was also previously seen by [facility] neurology and by the [facility].  No aura.  Looks like she also had botox 10/14/2020 unclear where ([facility]) and continues to see them last appointment 10/14/2020 and at this appointment they reported the botox doing very well.   From [facility] notes 12/2018 reviewed following and added items: CGRP: emgality, rimegepant(nurtec), Roselyn Meier Current and past medications ANALGESICS: tylenol, Excedrin, cafergot, Midol, cambia, ketorolac inj ANTI-MIGRAINE: sumatriptan (felt like I was going to die) HEART/BP: propranolol, nadolol DECONGESTANT/ANTIHISTAMINE: hydroxyzine, benadryl ANTI-NAUSEANT: phenergan, compazine, zofran NSAIDS: nabumetone, Etodolac, indocin, ibuprofen, naproxen, toradol inj, sprix MUSCLE RELAXANTS: tizanidine (felt weird), robaxin ANTI-CONVULSANTS: gabpaentin, topamax, trokendi STEROIDS: prednisone SLEEPING PILLS/TRANQUILIZERS: ANTI-DEPRESSANTS: amitriptyline, lexapro, sertraline HERBAL: melatonin, Mg, B2, CoQ10 FIBROMYALGIA:  HORMONAL: OTHER: Emgality (GI upset) PROCEDURES FOR HEADACHES: botox ( 3 treatments in 2019) The patient's allergies, current medications, past family history, past medical history, past social history, past surgical history and problem list were reviewed and updated as appropriate.  Past Medical History:  Diagnosis Date  ? Breast lump  ?  Endometriosis  ? Heart palpitations  ? IBS (irritable bowel syndrome)  ? Migraines  ? Sinus tachycardia   Past Surgical History:  Procedure Laterality Date  ? Hysterectomy  ? Knee surgery Left  ? Tonsillectomy       Review of Systems: Patient  complains of symptoms per HPI as well as the following symptoms: migraines . Pertinent negatives and positives per HPI. All others negative    Social History   Socioeconomic History   Marital status: Divorced    Spouse name: Not on file   Number of children: 1   Years of education: Not on file   Highest education level: Master's degree (e.g., MA, MS, MEng, MEd, MSW, MBA)  Occupational History   Occupation: RN  Tobacco Use   Smoking status: Former    Types: Cigarettes    Quit date: 08/09/2006    Years since quitting: 15.2   Smokeless tobacco: Never  Vaping Use   Vaping Use: Never used  Substance and Sexual Activity   Alcohol use: Not Currently    Comment: rare   Drug use: No   Sexual activity: Never    Birth control/protection: Surgical  Other Topics Concern   Not on file  Social History Narrative   Single, livevs alone in a 2 story house. Drinks two sodas a day. Does not exercise regularly.   Social Determinants of Health   Financial Resource Strain: Not on file  Food Insecurity: Not on file  Transportation Needs: Not on file  Physical Activity: Not on file  Stress: Not on file  Social Connections: Not on file  Intimate Partner Violence: Not on file    Family History  Problem Relation Age of Onset   Colon cancer Mother    Breast cancer Mother 54       and liver   Migraines Mother    Hyperlipidemia Father    Alcohol abuse Father    Heart attack Father    Cancer Father        Head and neck    Hyperlipidemia Brother    Lung cancer Maternal Grandmother    Parkinson's disease Maternal Grandfather    Dementia Maternal Grandfather    Prostate cancer Maternal Grandfather    Parkinson's disease Paternal Grandmother    Heart attack Paternal Grandfather    Kidney cancer Sister 35   Migraines Sister    Migraines Sister    Esophageal cancer Neg Hx     Past Medical History:  Diagnosis Date   Anal fissure    Anxiety    Breast calcifications on mammogram     Colon polyp    Dyslipidemia 06/24/2018   Elevated cholesterol    Facet arthropathy, lumbosacral 08/28/2017   GERD (gastroesophageal reflux disease)    History of colon polyps    IBS (irritable bowel syndrome)    Inappropriate sinus tachycardia    Migraines    Osteopenia    Osteoporosis of femur without pathological fracture 08/28/2017   Palpitations    Premature surgical menopause     Patient Active Problem List   Diagnosis Date Noted   Blepharospasm 11/08/2021   Sleep disorder, circadian, shift work type 08/01/2021   Excessive daytime sleepiness 08/01/2021   Morning headache 02/16/2021   Left eye pain 02/16/2021   Intractable episodic cluster headache 02/16/2021   Behavioral insomnia of childhood 02/16/2021   Retrognathia 02/16/2021   Chronic migraine without aura, with intractable migraine, so stated, with status migrainosus 01/12/2021   Muscle spasm 10/14/2020  Vitamin D deficiency 08/15/2020   Acute bilateral low back pain with bilateral sciatica 07/13/2020   Gastric polyp 02/11/2019   Sessile colonic polyp 02/11/2019   Chronic fatigue 02/11/2019   Grade I internal hemorrhoids 02/11/2019   Ductal hyperplasia of breast 08/21/2018   Cognitive changes 06/24/2018   Dyslipidemia 06/24/2018   Dense breast tissue on mammogram 04/17/2018   Fibrocystic breast determined by biopsy 04/09/2018   Family history of breast cancer in mother 04/09/2018   Paroxysmal sinus tachycardia ([place]) 03/27/2018   Osteoporosis 08/28/2017   Facet arthropathy, lumbosacral 08/28/2017   Chronic left SI joint pain 08/27/2017   Left lumbar radiculopathy 08/27/2017   Allergic rhinitis 08/09/2017   Disorder of lipoid metabolism 08/09/2017   Insomnia secondary to anxiety 08/09/2017   Postmenopausal osteoporosis 08/09/2017   History of hysterectomy for benign disease 08/09/2017   GAD (generalized anxiety disorder) 08/09/2017   Former smoker, stopped smoking in distant past 08/09/2017   Minor  depressive disorder 08/09/2017   Palpitations 03/16/2015   Family history of colon cancer 03/02/2015   Hiatal hernia with GERD without esophagitis 03/02/2015   History of adenomatous polyp of colon 03/02/2015   Chronic migraine without aura 10/10/2014   History of cardiovascular disorder 01/18/2003    Past Surgical History:  Procedure Laterality Date   ANAL FISSURE REPAIR     APPENDECTOMY  2000   COLONOSCOPY W/ POLYPECTOMY  03/2015   COLONOSCOPY WITH ESOPHAGOGASTRODUODENOSCOPY (EGD)  04/01/2015   KNEE SURGERY     OOPHORECTOMY     OOPHORECTOMY     1995, 1998   PELVIC LAPAROSCOPY     x 4 for endometriosis    VAGINAL HYSTERECTOMY  1994    Current Outpatient Medications  Medication Sig Dispense Refill   Atogepant 60 MG TABS Take 60 mg by mouth daily. 30 tablet 6   BOTOX 200 units SOLR INJECT 200 UNITS AS DIRECTED EVERY 3 (THREE) MONTHS. 1 each 3   Botulinum Toxin Type A (BOTOX) 200 units SOLR Inject 200 Units as directed every 3 (three) months. 1 each 1   Calcium Carbonate-Vitamin D 600-400 MG-UNIT tablet Take 1 tablet by mouth 2 (two) times daily. 60 tablet 11   Coenzyme Q10 (CO Q 10 PO) Take by mouth.     dicyclomine (BENTYL) 10 MG capsule TAKE 1 CAPSULE BY MOUTH TWO TIMES DAILY 1/2 HOUR BEFORE A MEAL AND AT BEDTIME 60 capsule 6   escitalopram (LEXAPRO) 10 MG tablet TAKE 1 TABLET BY MOUTH ONCE DAILY 90 tablet 1   methocarbamol (ROBAXIN) 750 MG tablet TAKE 1 TABLET BY MOUTH EVERY NIGHT AT BEDTIME AS NEEDED FOR HEADACHE 10 tablet 0   nadolol (CORGARD) 40 MG tablet Take 1 tablet (40 mg total) by mouth daily. 90 tablet 3   ondansetron (ZOFRAN ODT) 4 MG disintegrating tablet Take 1 tablet (4 mg total) by mouth every 6 (six) hours as needed for nausea or vomiting. 30 tablet 3   pantoprazole (PROTONIX) 40 MG tablet TAKE 1 TABLET BY MOUTH ONCE DAILY 30 tablet 6   polycarbophil (FIBERCON) 625 MG tablet Take 625 mg by mouth daily.     Riboflavin 400 MG CAPS Take 1 capsule by mouth daily.      Topiramate ER (TROKENDI XR) 200 MG CP24 TAKE ONE CAPSULE (200 MG DOSE) BY MOUTH DAILY. 90 capsule 3   traZODone (DESYREL) 50 MG tablet TAKE 1/2 - 1 TABLET BY MOUTH EVERY NIGHT AT BEDTIME AS NEEDED FOR SLEEP 30 tablet 3   No current facility-administered  medications for this visit.    Allergies as of 11/08/2021 - Review Complete 09/29/2021  Allergen Reaction Noted   Sulfa antibiotics Anaphylaxis 11/22/2011   Sumatriptan succinate Anaphylaxis 12/09/2015   Emgality [galcanezumab-gnlm] Nausea Only 02/11/2019    Vitals: There were no vitals taken for this visit. Last Weight:  Wt Readings from Last 1 Encounters:  09/29/21 136 lb (61.7 kg)   Last Height:   Ht Readings from Last 1 Encounters:  09/13/21 5\' 7"  (1.702 m)     Physical exam: Exam: Gen: NAD, conversant      CV:  Denies palpitations or chest pain or SOB. VS: Breathing at a normal rate. Weight appears within normal limits. Not febrile. Eyes: Conjunctivae clear without exudates or hemorrhage  Neuro: Detailed Neurologic Exam  Speech:    Speech is normal; fluent and spontaneous with normal comprehension.  Cognition:    The patient is oriented to person, place, and time;     recent and remote memory intact;     language fluent;     normal attention, concentration,     fund of knowledge Cranial Nerves:    The pupils are equal, round, and reactive to light. Cannot perform fundoscopic exam. Visual fields are full to finger confrontation. Extraocular movements are intact.  The face is symmetric with normal sensation. The palate elevates in the midline. Hearing intact. Voice is normal. Shoulder shrug is normal. The tongue has normal motion without fasciculations.   Motor Observation:   no involuntary movements noted. Tone:    Appears normal  Posture:    Posture is normal. normal erect    Strength:    Strength is anti-gravity and symmetric in the upper and lower limbs.      Sensation: intact to LT           Assessment/Plan:  61 y.o. female here as requested by Alice Pai, PA-C for chronic migraines since childhood, been treated in the past by Dr. Tomi Likens and with [facility], the [facility] headache center, [facility] [place] neurology and [facility] neurology. She is currently a patient with a headache specialist at [facility] and had her last botox in December of 2021.  We had a long conversation, she has been to many neurologists and still has a significant burden. Discussed stopping excedrin and sodas due to rebound. 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   CPAP is helping. Mild to moderate sleep apnea  2. Migraines are improved. Still having some occ. sharp pains brief. Botox is helping. Having 3-4 moderate to severe migraine days a month.  Baseline was headaches every day, at least 15 migraine days a month. Improved by 75% also the infusion every 3 months is helping.   3. Order Valene Bors - will let you know( I believe it is specialty pharmacy)  4. Ask Santiago Glad at [facility] in [place] who performs her botox for migraine to refer to Dr. Everlena Cooper team and see if they can perform botox for blepharospasm or even take over both conditions botox for migraine and blepharospasm.  5. Continue botox, qulipta and vyepti. > 80% improvement in migraines and no daily headaches.   6. In the past discussed [place] Cephaly, dry needling, seeing ophthalmology  7. Had MRI brain and c spine both unremarkable reviewed images separately (20 minutes additional)  8. Arranged follow ups  8. F/u one year NP for migraines and follow with NP here for sleep apnea   No orders of the defined types were placed in this  encounter.  Meds ordered this encounter  Medications   Atogepant 60 MG TABS    Sig: Take 60 mg by mouth daily.    Dispense:  30 tablet    Refill:  6   Topiramate ER (TROKENDI XR) 200 MG CP24    Sig: TAKE ONE CAPSULE (200 MG DOSE) BY MOUTH DAILY.    Dispense:  90 capsule     Refill:  3    Cc: Saguier, Iris Pert,  Saguier, [place], PA-C  Sarina Ill, MD  [facility] [address] [place], [place] [address]  Phone [phone] Fax [phone]  I spent 55 minutes of face-to-face and non-face-to-face time with patient on the  1. Chronic migraine without aura, with intractable migraine, so stated, with status migrainosus   2. Blepharospasm    diagnosis.  This included previsit chart review, lab review, study review, order entry, electronic health record documentation, patient education on the different diagnostic and therapeutic options, counseling and coordination of care, risks and benefits of management, compliance, or risk factor reduction. Includes 20 additional minutes for mri brain and cervical spine review

## 2021-11-08 NOTE — Patient Instructions (Signed)
Ask Alice Williams at [facility] in [place] who performs her botox for migraine to refer to Dr. Everlena Cooper team and see if they can perform botox for blepharospasm or even take over both conditions botox for migraine and blepharospasm.

## 2021-11-08 NOTE — Telephone Encounter (Signed)
Contact pt to remind of MyChart Video Visit today 9:30 am with Dr. Jaynee Eagles. Confirmed will be on 15 mins early.

## 2021-11-09 ENCOUNTER — Other Ambulatory Visit (HOSPITAL_BASED_OUTPATIENT_CLINIC_OR_DEPARTMENT_OTHER): Payer: Self-pay

## 2021-11-13 ENCOUNTER — Telehealth: Payer: Self-pay | Admitting: *Deleted

## 2021-11-13 NOTE — Telephone Encounter (Signed)
Nerivio device order form completed, signed, and faxed to [facility].  Received a receipt of confirmation. Also mailed a pamphlet to the pt's home last week.

## 2021-11-16 NOTE — Telephone Encounter (Signed)
Prior auth required for PROLIA  PA PROCESS DETAILS: PA is required and is currently not on file. Please call Medical Review at [phone] to initiate PA.  

## 2021-11-17 ENCOUNTER — Ambulatory Visit: Payer: No Typology Code available for payment source | Admitting: Medical

## 2021-11-20 ENCOUNTER — Other Ambulatory Visit (HOSPITAL_BASED_OUTPATIENT_CLINIC_OR_DEPARTMENT_OTHER): Payer: Self-pay

## 2021-11-21 ENCOUNTER — Encounter: Payer: No Typology Code available for payment source | Admitting: Medical

## 2021-11-27 ENCOUNTER — Encounter: Payer: Self-pay | Admitting: Orthopedic Surgery

## 2021-11-27 ENCOUNTER — Encounter (HOSPITAL_COMMUNITY): Payer: Self-pay | Admitting: Orthopedic Surgery

## 2021-11-27 ENCOUNTER — Other Ambulatory Visit: Payer: Self-pay

## 2021-11-27 ENCOUNTER — Ambulatory Visit (INDEPENDENT_AMBULATORY_CARE_PROVIDER_SITE_OTHER): Payer: No Typology Code available for payment source | Admitting: Orthopedic Surgery

## 2021-11-27 DIAGNOSIS — S62184A Nondisplaced fracture of trapezoid [smaller multangular], right wrist, initial encounter for closed fracture: Secondary | ICD-10-CM

## 2021-11-27 DIAGNOSIS — S82891A Other fracture of right lower leg, initial encounter for closed fracture: Secondary | ICD-10-CM

## 2021-11-27 NOTE — Progress Notes (Signed)
Spoke with pt for pre-op call. Pt denies HTN or Diabetes. Pt has hx of palpitations. Dr. Percival Spanish is her cardiologist. Last office visit was 09/13/21.   Pt's surgery is scheduled as ambulatory so no Covid test is required prior to surgery.

## 2021-11-28 ENCOUNTER — Encounter (HOSPITAL_COMMUNITY)
Admission: RE | Disposition: A | Discharge status: Home / Self Care | Payer: Self-pay | Source: Home / Self Care | Attending: Orthopedic Surgery

## 2021-11-28 ENCOUNTER — Encounter: Payer: Self-pay | Admitting: Orthopedic Surgery

## 2021-11-28 ENCOUNTER — Ambulatory Visit (HOSPITAL_COMMUNITY): Payer: No Typology Code available for payment source | Admitting: Certified Registered"

## 2021-11-28 ENCOUNTER — Encounter (HOSPITAL_COMMUNITY): Payer: Self-pay | Admitting: Orthopedic Surgery

## 2021-11-28 ENCOUNTER — Other Ambulatory Visit: Payer: Self-pay

## 2021-11-28 ENCOUNTER — Encounter: Payer: No Typology Code available for payment source | Admitting: Medical

## 2021-11-28 ENCOUNTER — Ambulatory Visit (HOSPITAL_COMMUNITY): Payer: No Typology Code available for payment source

## 2021-11-28 ENCOUNTER — Ambulatory Visit (HOSPITAL_COMMUNITY)
Admission: RE | Admit: 2021-11-28 | Discharge: 2021-11-28 | Disposition: A | Discharge status: Home / Self Care | Payer: No Typology Code available for payment source | Attending: Orthopedic Surgery | Admitting: Orthopedic Surgery

## 2021-11-28 DIAGNOSIS — Z791 Long term (current) use of non-steroidal anti-inflammatories (NSAID): Secondary | ICD-10-CM | POA: Diagnosis not present

## 2021-11-28 DIAGNOSIS — Z419 Encounter for procedure for purposes other than remedying health state, unspecified: Secondary | ICD-10-CM

## 2021-11-28 DIAGNOSIS — M810 Age-related osteoporosis without current pathological fracture: Secondary | ICD-10-CM | POA: Diagnosis not present

## 2021-11-28 DIAGNOSIS — E7800 Pure hypercholesterolemia, unspecified: Secondary | ICD-10-CM | POA: Insufficient documentation

## 2021-11-28 DIAGNOSIS — S82851A Displaced trimalleolar fracture of right lower leg, initial encounter for closed fracture: Secondary | ICD-10-CM | POA: Insufficient documentation

## 2021-11-28 DIAGNOSIS — F419 Anxiety disorder, unspecified: Secondary | ICD-10-CM | POA: Insufficient documentation

## 2021-11-28 DIAGNOSIS — K219 Gastro-esophageal reflux disease without esophagitis: Secondary | ICD-10-CM | POA: Diagnosis not present

## 2021-11-28 DIAGNOSIS — M8580 Other specified disorders of bone density and structure, unspecified site: Secondary | ICD-10-CM | POA: Diagnosis not present

## 2021-11-28 DIAGNOSIS — G4730 Sleep apnea, unspecified: Secondary | ICD-10-CM | POA: Insufficient documentation

## 2021-11-28 DIAGNOSIS — Z87891 Personal history of nicotine dependence: Secondary | ICD-10-CM | POA: Insufficient documentation

## 2021-11-28 DIAGNOSIS — W109XXA Fall (on) (from) unspecified stairs and steps, initial encounter: Secondary | ICD-10-CM | POA: Diagnosis not present

## 2021-11-28 DIAGNOSIS — S82841D Displaced bimalleolar fracture of right lower leg, subsequent encounter for closed fracture with routine healing: Secondary | ICD-10-CM

## 2021-11-28 DIAGNOSIS — S82841A Displaced bimalleolar fracture of right lower leg, initial encounter for closed fracture: Secondary | ICD-10-CM | POA: Diagnosis not present

## 2021-11-28 DIAGNOSIS — Z01818 Encounter for other preprocedural examination: Secondary | ICD-10-CM

## 2021-11-28 DIAGNOSIS — Z79899 Other long term (current) drug therapy: Secondary | ICD-10-CM | POA: Insufficient documentation

## 2021-11-28 HISTORY — DX: Malignant (primary) neoplasm, unspecified: C80.1

## 2021-11-28 HISTORY — DX: Other specified postprocedural states: Z98.890

## 2021-11-28 HISTORY — PX: ORIF ANKLE FRACTURE: SHX5408

## 2021-11-28 HISTORY — DX: Unspecified osteoarthritis, unspecified site: M19.90

## 2021-11-28 HISTORY — DX: Post-traumatic stress disorder, unspecified: F43.10

## 2021-11-28 HISTORY — DX: Nausea with vomiting, unspecified: R11.2

## 2021-11-28 HISTORY — DX: Anemia, unspecified: D64.9

## 2021-11-28 HISTORY — DX: Family history of other specified conditions: Z84.89

## 2021-11-28 HISTORY — DX: Cardiac murmur, unspecified: R01.1

## 2021-11-28 HISTORY — DX: Personal history of urinary calculi: Z87.442

## 2021-11-28 HISTORY — DX: Sleep apnea, unspecified: G47.30

## 2021-11-28 LAB — CBC
HCT: 39.2 % (ref 36.0–46.0)
Hemoglobin: 13.3 g/dL (ref 12.0–15.0)
MCH: 30 pg (ref 26.0–34.0)
MCHC: 33.9 g/dL (ref 30.0–36.0)
MCV: 88.5 fL (ref 80.0–100.0)
Platelets: 215 10*3/uL (ref 150–400)
RBC: 4.43 MIL/uL (ref 3.87–5.11)
RDW: 12.5 % (ref 11.5–15.5)
WBC: 12.3 10*3/uL — ABNORMAL HIGH (ref 4.0–10.5)
nRBC: 0 % (ref 0.0–0.2)

## 2021-11-28 LAB — BASIC METABOLIC PANEL
Anion gap: 8 (ref 5–15)
BUN: 11 mg/dL (ref 6–20)
CO2: 20 mmol/L — ABNORMAL LOW (ref 22–32)
Calcium: 8.1 mg/dL — ABNORMAL LOW (ref 8.9–10.3)
Chloride: 110 mmol/L (ref 98–111)
Creatinine, Ser: 0.84 mg/dL (ref 0.44–1.00)
GFR, Estimated: >60 mL/min (ref >60)
Glucose, Bld: 101 mg/dL — ABNORMAL HIGH (ref 70–99)
Potassium: 3.2 mmol/L — ABNORMAL LOW (ref 3.5–5.1)
Sodium: 138 mmol/L (ref 135–145)

## 2021-11-28 LAB — SURGICAL PCR SCREEN
MRSA, PCR: NEGATIVE
Staphylococcus aureus: NEGATIVE

## 2021-11-28 SURGERY — OPEN REDUCTION INTERNAL FIXATION (ORIF) ANKLE FRACTURE
Anesthesia: General | Site: Ankle | Laterality: Right

## 2021-11-28 MED ORDER — PHENYLEPHRINE 40 MCG/ML (10ML) SYRINGE FOR IV PUSH (FOR BLOOD PRESSURE SUPPORT)
PREFILLED_SYRINGE | INTRAVENOUS | Status: DC | PRN
  Administered 2021-11-28: 200 ug via INTRAVENOUS
  Administered 2021-11-28: 80 ug via INTRAVENOUS
  Administered 2021-11-28: 200 ug via INTRAVENOUS
  Administered 2021-11-28 (×2): 160 ug via INTRAVENOUS

## 2021-11-28 MED ORDER — EPHEDRINE 5 MG/ML INJ
INTRAVENOUS | Status: AC
  Filled 2021-11-28: qty 5

## 2021-11-28 MED ORDER — CEFAZOLIN SODIUM-DEXTROSE 2-4 GM/100ML-% IV SOLN
INTRAVENOUS | Status: AC
  Filled 2021-11-28: qty 100

## 2021-11-28 MED ORDER — PHENYLEPHRINE 40 MCG/ML (10ML) SYRINGE FOR IV PUSH (FOR BLOOD PRESSURE SUPPORT)
PREFILLED_SYRINGE | INTRAVENOUS | Status: AC
  Filled 2021-11-28: qty 20

## 2021-11-28 MED ORDER — OXYCODONE HCL 5 MG/5ML PO SOLN: 5.0000 mg | Freq: Once | ORAL | Status: DC | PRN

## 2021-11-28 MED ORDER — TRANEXAMIC ACID-NACL 1000-0.7 MG/100ML-% IV SOLN
1000.0000 mg | INTRAVENOUS | Status: AC
  Administered 2021-11-28: 1000 mg via INTRAVENOUS
  Filled 2021-11-28: qty 100

## 2021-11-28 MED ORDER — CHLORHEXIDINE GLUCONATE 0.12 % MT SOLN: 15.0000 mL | Freq: Once | OROMUCOSAL | Status: AC

## 2021-11-28 MED ORDER — MIDAZOLAM HCL 2 MG/2ML IJ SOLN: 2.0000 mg | Freq: Once | INTRAMUSCULAR | Status: AC

## 2021-11-28 MED ORDER — PHENYLEPHRINE HCL-NACL 20-0.9 MG/250ML-% IV SOLN
INTRAVENOUS | Status: DC | PRN
  Administered 2021-11-28: 100 ug/min via INTRAVENOUS

## 2021-11-28 MED ORDER — ACETAMINOPHEN 500 MG PO TABS
1000.0000 mg | ORAL_TABLET | Freq: Once | ORAL | Status: AC
  Administered 2021-11-28: 1000 mg via ORAL
  Filled 2021-11-28: qty 2

## 2021-11-28 MED ORDER — FENTANYL CITRATE (PF) 250 MCG/5ML IJ SOLN
INTRAMUSCULAR | Status: AC
  Filled 2021-11-28: qty 5

## 2021-11-28 MED ORDER — CHLORHEXIDINE GLUCONATE 0.12 % MT SOLN
OROMUCOSAL | Status: AC
  Administered 2021-11-28: 15 mL via OROMUCOSAL
  Filled 2021-11-28: qty 15

## 2021-11-28 MED ORDER — OXYCODONE-ACETAMINOPHEN 5-325 MG PO TABS: 1.0000 | ORAL_TABLET | ORAL | 0 refills | Status: AC | PRN

## 2021-11-28 MED ORDER — FENTANYL CITRATE (PF) 100 MCG/2ML IJ SOLN
INTRAMUSCULAR | Status: AC
  Administered 2021-11-28: 100 ug via INTRAVENOUS
  Filled 2021-11-28: qty 2

## 2021-11-28 MED ORDER — METHOCARBAMOL 500 MG PO TABS: 500.0000 mg | ORAL_TABLET | Freq: Three times a day (TID) | ORAL | 0 refills | Status: DC | PRN

## 2021-11-28 MED ORDER — MIDAZOLAM HCL 2 MG/2ML IJ SOLN
INTRAMUSCULAR | Status: AC
  Administered 2021-11-28: 2 mg via INTRAVENOUS
  Filled 2021-11-28: qty 2

## 2021-11-28 MED ORDER — MEPERIDINE HCL 25 MG/ML IJ SOLN: 6.2500 mg | INTRAMUSCULAR | Status: DC | PRN

## 2021-11-28 MED ORDER — ROCURONIUM BROMIDE 10 MG/ML (PF) SYRINGE
PREFILLED_SYRINGE | INTRAVENOUS | Status: AC
  Filled 2021-11-28: qty 10

## 2021-11-28 MED ORDER — ASPIRIN 81 MG PO CHEW: 81.0000 mg | CHEWABLE_TABLET | Freq: Every day | ORAL | 0 refills | Status: DC

## 2021-11-28 MED ORDER — 0.9 % SODIUM CHLORIDE (POUR BTL) OPTIME
TOPICAL | Status: DC | PRN
  Administered 2021-11-28: 3000 mL

## 2021-11-28 MED ORDER — VANCOMYCIN HCL 1000 MG IV SOLR
INTRAVENOUS | Status: AC
  Filled 2021-11-28: qty 20

## 2021-11-28 MED ORDER — SCOPOLAMINE 1 MG/3DAYS TD PT72
1.0000 | MEDICATED_PATCH | Freq: Once | TRANSDERMAL | Status: DC
  Administered 2021-11-28: 1.5 mg via TRANSDERMAL
  Filled 2021-11-28: qty 1

## 2021-11-28 MED ORDER — VASOPRESSIN 20 UNIT/ML IV SOLN
INTRAVENOUS | Status: AC
  Filled 2021-11-28: qty 1

## 2021-11-28 MED ORDER — PROMETHAZINE HCL 25 MG/ML IJ SOLN: 6.2500 mg | INTRAMUSCULAR | Status: DC | PRN

## 2021-11-28 MED ORDER — BUPIVACAINE HCL (PF) 0.25 % IJ SOLN
INTRAMUSCULAR | Status: AC
  Filled 2021-11-28: qty 30

## 2021-11-28 MED ORDER — ONDANSETRON HCL 4 MG/2ML IJ SOLN
INTRAMUSCULAR | Status: DC | PRN
  Administered 2021-11-28: 4 mg via INTRAVENOUS

## 2021-11-28 MED ORDER — MIDAZOLAM HCL 2 MG/2ML IJ SOLN: 0.5000 mg | Freq: Once | INTRAMUSCULAR | Status: DC | PRN

## 2021-11-28 MED ORDER — VASOPRESSIN 20 UNIT/ML IV SOLN
INTRAVENOUS | Status: DC | PRN
  Administered 2021-11-28: 2 [IU] via INTRAVENOUS
  Administered 2021-11-28: 1 [IU] via INTRAVENOUS

## 2021-11-28 MED ORDER — POVIDONE-IODINE 7.5 % EX SOLN
Freq: Once | CUTANEOUS | Status: DC
  Filled 2021-11-28: qty 118

## 2021-11-28 MED ORDER — DEXAMETHASONE SODIUM PHOSPHATE 10 MG/ML IJ SOLN
INTRAMUSCULAR | Status: DC | PRN
  Administered 2021-11-28: 8 mg via INTRAVENOUS

## 2021-11-28 MED ORDER — LACTATED RINGERS IV SOLN: INTRAVENOUS | Status: DC

## 2021-11-28 MED ORDER — OXYCODONE HCL 5 MG PO TABS: 5.0000 mg | ORAL_TABLET | Freq: Once | ORAL | Status: DC | PRN

## 2021-11-28 MED ORDER — ORAL CARE MOUTH RINSE: 15.0000 mL | Freq: Once | OROMUCOSAL | Status: AC

## 2021-11-28 MED ORDER — CEFAZOLIN SODIUM-DEXTROSE 2-4 GM/100ML-% IV SOLN
2.0000 g | INTRAVENOUS | Status: AC
  Administered 2021-11-28: 2 g via INTRAVENOUS

## 2021-11-28 MED ORDER — BUPIVACAINE-EPINEPHRINE (PF) 0.5% -1:200000 IJ SOLN
INTRAMUSCULAR | Status: DC | PRN
Start: 2021-11-28 — End: 2021-11-28
  Administered 2021-11-28: 20 mL via PERINEURAL
  Administered 2021-11-28: 10 mL via PERINEURAL

## 2021-11-28 MED ORDER — LIDOCAINE 2% (20 MG/ML) 5 ML SYRINGE
INTRAMUSCULAR | Status: DC | PRN
  Administered 2021-11-28: 50 mg via INTRAVENOUS

## 2021-11-28 MED ORDER — EPHEDRINE SULFATE-NACL 50-0.9 MG/10ML-% IV SOSY
PREFILLED_SYRINGE | INTRAVENOUS | Status: DC | PRN
  Administered 2021-11-28: 10 mg via INTRAVENOUS
  Administered 2021-11-28 (×2): 15 mg via INTRAVENOUS

## 2021-11-28 MED ORDER — FENTANYL CITRATE (PF) 100 MCG/2ML IJ SOLN: 100.0000 ug | Freq: Once | INTRAMUSCULAR | Status: AC

## 2021-11-28 MED ORDER — PROPOFOL 10 MG/ML IV BOLUS
INTRAVENOUS | Status: AC
  Filled 2021-11-28: qty 20

## 2021-11-28 MED ORDER — HYDROMORPHONE HCL 1 MG/ML IJ SOLN: 0.2500 mg | INTRAMUSCULAR | Status: DC | PRN

## 2021-11-28 MED ORDER — PROPOFOL 10 MG/ML IV BOLUS
INTRAVENOUS | Status: DC | PRN
  Administered 2021-11-28: 120 mg via INTRAVENOUS

## 2021-11-28 MED ORDER — ONDANSETRON HCL 4 MG/2ML IJ SOLN
INTRAMUSCULAR | Status: AC
  Filled 2021-11-28: qty 2

## 2021-11-28 MED ORDER — MIDAZOLAM HCL 2 MG/2ML IJ SOLN
INTRAMUSCULAR | Status: AC
  Filled 2021-11-28: qty 2

## 2021-11-28 MED ORDER — VANCOMYCIN HCL 1000 MG IV SOLR
INTRAVENOUS | Status: DC | PRN
  Administered 2021-11-28: 1000 mg

## 2021-11-28 MED ORDER — POVIDONE-IODINE 10 % EX SWAB
2.0000 "application " | Freq: Once | CUTANEOUS | Status: AC
  Administered 2021-11-28: 2 via TOPICAL

## 2021-11-28 MED ORDER — DEXAMETHASONE SODIUM PHOSPHATE 10 MG/ML IJ SOLN
INTRAMUSCULAR | Status: AC
  Filled 2021-11-28: qty 1

## 2021-11-28 SURGICAL SUPPLY — 78 items
BAG COUNTER SPONGE SURGICOUNT (BAG) ×2 IMPLANT
BIT DRILL 2.7 QC CANN 155 (BIT) ×1 IMPLANT
BIT DRILL OVR 3.5AO QC SHRT SM (DRILL) IMPLANT
BIT DRILL QC 2.0 SHORT EVOS SM (DRILL) IMPLANT
BIT DRILL QC 2.5MM SHRT EVO SM (DRILL) IMPLANT
BLADE SURG 10 STRL SS (BLADE) IMPLANT
BNDG ELASTIC 4X5.8 VLCR STR LF (GAUZE/BANDAGES/DRESSINGS) ×2 IMPLANT
BNDG ELASTIC 6X5.8 VLCR STR LF (GAUZE/BANDAGES/DRESSINGS) ×1 IMPLANT
BNDG ESMARK 4X9 LF (GAUZE/BANDAGES/DRESSINGS) ×2 IMPLANT
COVER MAYO STAND STRL (DRAPES) IMPLANT
COVER SURGICAL LIGHT HANDLE (MISCELLANEOUS) ×2 IMPLANT
CUFF TOURN SGL QUICK 34 (TOURNIQUET CUFF)
CUFF TOURN SGL QUICK 42 (TOURNIQUET CUFF) IMPLANT
CUFF TRNQT CYL 34X4.125X (TOURNIQUET CUFF) IMPLANT
DRAPE C-ARM 42X72 X-RAY (DRAPES) ×2 IMPLANT
DRAPE INCISE IOBAN 66X45 STRL (DRAPES) IMPLANT
DRAPE SURG 17X23 STRL (DRAPES) ×2 IMPLANT
DRAPE U-SHAPE 47X51 STRL (DRAPES) ×2 IMPLANT
DRILL OVER 3.5 AO QC SHORT SM (DRILL) ×2
DRILL QC 2.0 SHORT EVOS SM (DRILL) ×2
DRILL QC 2.5MM SHORT EVOS SM (DRILL) ×2
DRSG AQUACEL AG ADV 3.5X 6 (GAUZE/BANDAGES/DRESSINGS) ×2 IMPLANT
DURAPREP 26ML APPLICATOR (WOUND CARE) IMPLANT
ELECT CAUTERY BLADE 6.4 (BLADE) ×2 IMPLANT
ELECT REM PT RETURN 9FT ADLT (ELECTROSURGICAL) ×2
ELECTRODE REM PT RTRN 9FT ADLT (ELECTROSURGICAL) ×1 IMPLANT
GLOVE SRG 8 PF TXTR STRL LF DI (GLOVE) ×1 IMPLANT
GLOVE SURG LTX SZ7 (GLOVE) ×2 IMPLANT
GLOVE SURG LTX SZ8 (GLOVE) ×2 IMPLANT
GLOVE SURG UNDER POLY LF SZ7 (GLOVE) ×2 IMPLANT
GLOVE SURG UNDER POLY LF SZ8 (GLOVE) ×1
GOWN STRL REUS W/ TWL LRG LVL3 (GOWN DISPOSABLE) ×3 IMPLANT
GOWN STRL REUS W/TWL LRG LVL3 (GOWN DISPOSABLE) ×3
HANDPIECE INTERPULSE COAX TIP (DISPOSABLE)
K-WIRE 1.3X40 (WIRE) ×4
KIT BASIN OR (CUSTOM PROCEDURE TRAY) ×2 IMPLANT
KIT TURNOVER KIT B (KITS) ×2 IMPLANT
KWIRE 1.3X40 (WIRE) IMPLANT
MANIFOLD NEPTUNE II (INSTRUMENTS) ×2 IMPLANT
NDL HYPO 25GX1X1/2 BEV (NEEDLE) ×1 IMPLANT
NEEDLE HYPO 25GX1X1/2 BEV (NEEDLE) ×2 IMPLANT
NS IRRIG 1000ML POUR BTL (IV SOLUTION) ×2 IMPLANT
PACK ORTHO EXTREMITY (CUSTOM PROCEDURE TRAY) ×2 IMPLANT
PAD ABD 8X10 STRL (GAUZE/BANDAGES/DRESSINGS) ×5 IMPLANT
PAD ARMBOARD 7.5X6 YLW CONV (MISCELLANEOUS) ×4 IMPLANT
PADDING CAST COTTON 6X4 STRL (CAST SUPPLIES) ×2 IMPLANT
PLATE FIB EVOS 81X2.7/3.5 5H (Plate) ×1 IMPLANT
SCREW CANN 4.0X14 (Screw) ×1 IMPLANT
SCREW CANNULATED 4.1X40 (Screw) ×2 IMPLANT
SCREW CORT 2.7X12 EVOS (Screw) IMPLANT
SCREW CORT 2.7X14 T8 EVOS (Screw) ×1 IMPLANT
SCREW CORT 3.5X10MM ST EVOS (Screw) ×2 IMPLANT
SCREW CORT 3.5X8MM ST EVOS (Screw) ×1 IMPLANT
SCREW CORT EVOS ST 3.5X12 (Screw) ×1 IMPLANT
SCREW CORT EVOS ST T8 2.7X14MM (Screw) ×1 IMPLANT
SCREW CORT VA EVOS 2.7X28 (Screw) ×1 IMPLANT
SCREW CORTEX 3.5X18 EVOS (Screw) ×1 IMPLANT
SCREW LOCK 2.7X13 ST EVOS (Screw) ×3 IMPLANT
SCREW VA ST EVOS 2.7X6 (Screw) ×1 IMPLANT
SET HNDPC FAN SPRY TIP SCT (DISPOSABLE) IMPLANT
STOCKINETTE IMPERVIOUS 9X36 MD (GAUZE/BANDAGES/DRESSINGS) IMPLANT
SUCTION FRAZIER HANDLE 10FR (MISCELLANEOUS) ×1
SUCTION TUBE FRAZIER 10FR DISP (MISCELLANEOUS) ×1 IMPLANT
SUT 0 FIBERLOOP 38 BLUE TPR ND (SUTURE) ×2
SUT ETHILON 3 0 PS 1 (SUTURE) ×4 IMPLANT
SUT VIC AB 0 CT1 27 (SUTURE) ×1
SUT VIC AB 0 CT1 27XBRD ANBCTR (SUTURE) IMPLANT
SUT VIC AB 2-0 CTB1 (SUTURE) ×3 IMPLANT
SUT VIC AB 3-0 SH 27 (SUTURE) ×2
SUT VIC AB 3-0 SH 27X BRD (SUTURE) IMPLANT
SUTURE 0 FIBERLP 38 BLU TPR ND (SUTURE) IMPLANT
SYR CONTROL 10ML LL (SYRINGE) ×2 IMPLANT
TOWEL GREEN STERILE (TOWEL DISPOSABLE) ×2 IMPLANT
TOWEL GREEN STERILE FF (TOWEL DISPOSABLE) ×2 IMPLANT
TUBE CONNECTING 12X1/4 (SUCTIONS) ×2 IMPLANT
WASHER 4.0 (Washer) ×1 IMPLANT
WASHER ORTH THK1X8X4XSTRL (Washer) IMPLANT
YANKAUER SUCT BULB TIP NO VENT (SUCTIONS) IMPLANT

## 2021-11-28 NOTE — Brief Op Note (Signed)
° °  11/28/2021  6:17 PM  PATIENT:  Alice Williams  61 y.o. female  PRE-OPERATIVE DIAGNOSIS:  right ankle trimalleolar fracture  POST-OPERATIVE DIAGNOSIS:  right ankle trimalleolar fracture  PROCEDURE:  Procedure(s): OPEN REDUCTION INTERNAL FIXATION (ORIF) ANKLE FRACTURE  SURGEON:  Surgeon(s): Meredith Pel, MD  ASSISTANT: magnant pa  ANESTHESIA:   general  EBL: 25 ml    Total I/O In: 1000 [I.V.:1000] Out: 50 [Blood:50]  BLOOD ADMINISTERED: none  DRAINS: none   LOCAL MEDICATIONS USED:  vanco powder  SPECIMEN:  No Specimen  COUNTS:  YES  TOURNIQUET:  * Missing tourniquet times found for documented tourniquets in log: [id number] *  DICTATION: .Other Dictation: Dictation Number [id number]  PLAN OF CARE: Discharge to home after PACU  PATIENT DISPOSITION:  PACU - hemodynamically stable

## 2021-11-28 NOTE — Anesthesia Procedure Notes (Signed)
Anesthesia Regional Block: Adductor canal block   Pre-Anesthetic Checklist: , timeout performed,  Correct Patient, Correct Site, Correct Laterality,  Correct Procedure, Correct Position, site marked,  Risks and benefits discussed,  Surgical consent,  Pre-op evaluation,  At surgeon's request and post-op pain management  Laterality: Right and Lower  Prep: chloraprep       Needles:  Injection technique: Single-shot  Needle Type: Echogenic Needle     Needle Length: 9cm  Needle Gauge: 21     Additional Needles:   Procedures:,,,, ultrasound used (permanent image in chart),,    Narrative:  Start time: 11/28/2021 2:18 PM End time: 11/28/2021 2:24 PM Injection made incrementally with aspirations every 5 mL.  Performed by: Personally  Anesthesiologist: Annye Asa, MD  Additional Notes: Pt identified in Holding room.  Monitors applied. Working IV access confirmed. Sterile prep R thigh.  #21ga ECHOgenic Arrow block needle into adductor canal with US guidance.  10cc 0.5% Bupivacaine 1:200k epi injected incrementally after negative test dose.  Patient asymptomatic, VSS, no heme aspirated, tolerated well.   Jenita Seashore, MD

## 2021-11-28 NOTE — Progress Notes (Signed)
Office Visit Note   Patient: Alice Williams           Date of Birth: October 27, 1961           MRN: [id number] Visit Date: 11/27/2021 Requested by: Mackie Pai, PA-C [place],  [place] [address] PCP: Elise Benne  Subjective: Chief Complaint  Patient presents with   Right Ankle - New Patient (Initial Visit)    HPI: Alice Williams is a 61 year old patient who injured her right ankle and right hand on Friday 3 days before her current clinic appointment.  She slipped and slid down the stairs.  She has been ambulating in a wheelchair.  Oxycodone being used as needed.  Also uses ibuprofen and Tylenol.  No prior injury or surgery to the right hand or right ankle.  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  Denies any personal or family history of DVT or pulmonary embolism.  Outside radiographs and CT scans are reviewed.  She has nondisplaced fracture through the trapezium and Pisa form.  That is involving the right hand.  She also has trimalleolar ankle fracture which has been reduced and splinted.              ROS: All systems reviewed are negative as they relate to the chief complaint within the history of present illness.  Patient denies  fevers or chills.   Assessment & Plan: Visit Diagnoses: No diagnosis found.  Plan: Impression is nondisplaced right Pisa form and trapezial fracture.  This can be treated with a removable wrist splint which she should keep on at all times.  Nonweightbearing through the right hand.  Regarding the right ankle she has no fracture blisters and her swelling is fairly minimal.  Plan at this time is open reduction internal fixation of the trimalleolar ankle fracture.  The risk and benefits are discussed with the patient including not limited to infection nerve vessel damage potential need for hardware removal as well as development of ankle stiffness and arthritis.  Anticipate 3 weeks until beginning ankle range of motion and  likely 4 weeks until beginning weightbearing.  We will write her a prescription for knee scooter.  Plan for surgery on 11/28/2021.  All questions answered  Follow-Up Instructions: Return in about 1 week (around 12/04/2021).   Orders:  No orders of the defined types were placed in this encounter.  No orders of the defined types were placed in this encounter.     Procedures: No procedures performed   Clinical Data: No additional findings.  Objective: Vital Signs: There were no vitals taken for this visit.  Physical Exam:   Constitutional: Patient appears well-developed HEENT:  Head: Normocephalic Eyes:EOM are normal Neck: Normal range of motion Cardiovascular: Normal rate Pulmonary/chest: Effort normal Neurologic: Patient is alert Skin: Skin is warm Psychiatric: Patient has normal mood and affect   Ortho Exam: Ortho exam demonstrates Toradol l tattoo on the medial aspect of the right ankle.  Pedal pulses intact.  Negative Homans no calf tenderness.  Ankle dorsiflexion plantarflexion intact.  No fracture blisters are present.  Mild swelling is present around the medial lateral malleolus.  Compartments are soft.  Right wrist is examined.  Bruising ecchymosis present on the ulnar aspect volar side of the wrist.  Overall fairly minimal pain with pronation supination flexion and extension.  Grip strength is functional.  Radial pulse intact.  Specialty Comments:  No specialty comments available.  Imaging: No results found.  PMFS History: Patient Active Problem List   Diagnosis Date Noted   Blepharospasm 11/08/2021   Sleep disorder, circadian, shift work type 08/01/2021   Excessive daytime sleepiness 08/01/2021   Morning headache 02/16/2021   Left eye pain 02/16/2021   Intractable episodic cluster headache 02/16/2021   Behavioral insomnia of childhood 02/16/2021   Retrognathia 02/16/2021   Chronic migraine without aura, with intractable migraine, so stated, with status  migrainosus 01/12/2021   Muscle spasm 10/14/2020   Vitamin D deficiency 08/15/2020   Acute bilateral low back pain with bilateral sciatica 07/13/2020   Gastric polyp 02/11/2019   Sessile colonic polyp 02/11/2019   Chronic fatigue 02/11/2019   Grade I internal hemorrhoids 02/11/2019   Ductal hyperplasia of breast 08/21/2018   Cognitive changes 06/24/2018   Dyslipidemia 06/24/2018   Dense breast tissue on mammogram 04/17/2018   Fibrocystic breast determined by biopsy 04/09/2018   Family history of breast cancer in mother 04/09/2018   Paroxysmal sinus tachycardia ([place]) 03/27/2018   Osteoporosis 08/28/2017   Facet arthropathy, lumbosacral 08/28/2017   Chronic left SI joint pain 08/27/2017   Left lumbar radiculopathy 08/27/2017   Allergic rhinitis 08/09/2017   Disorder of lipoid metabolism 08/09/2017   Insomnia secondary to anxiety 08/09/2017   Postmenopausal osteoporosis 08/09/2017   History of hysterectomy for benign disease 08/09/2017   GAD (generalized anxiety disorder) 08/09/2017   Former smoker, stopped smoking in distant past 08/09/2017   Minor depressive disorder 08/09/2017   Palpitations 03/16/2015   Family history of colon cancer 03/02/2015   Hiatal hernia with GERD without esophagitis 03/02/2015   History of adenomatous polyp of colon 03/02/2015   Chronic migraine without aura 10/10/2014   History of cardiovascular disorder 01/18/2003   Past Medical History:  Diagnosis Date   Anal fissure    Anemia    in early 20's   Anxiety    Arthritis    back   Breast calcifications on mammogram    Cancer ([place])    Colon polyp    Dyslipidemia 06/24/2018   Elevated cholesterol    Facet arthropathy, lumbosacral 08/28/2017   Family history of adverse reaction to anesthesia    sibling has N/V   GERD (gastroesophageal reflux disease)    Heart murmur    History of colon polyps    History of kidney stones    has small ones   IBS (irritable bowel syndrome)    Inappropriate  sinus tachycardia    Migraines    Osteopenia    Osteoporosis of femur without pathological fracture 08/28/2017   Palpitations    PONV (postoperative nausea and vomiting)    Premature surgical menopause    PTSD (post-traumatic stress disorder)     Family History  Problem Relation Age of Onset   Colon cancer Mother    Breast cancer Mother 67       and liver   Migraines Mother    Hyperlipidemia Father    Alcohol abuse Father    Heart attack Father    Cancer Father        Head and neck    Hyperlipidemia Brother    Lung cancer Maternal Grandmother    Parkinson's disease Maternal Grandfather    Dementia Maternal Grandfather    Prostate cancer Maternal Grandfather    Parkinson's disease Paternal Grandmother    Heart attack Paternal Grandfather    Kidney cancer Sister 29   Migraines Sister    Migraines Sister    Esophageal cancer Neg Hx  Past Surgical History:  Procedure Laterality Date   ANAL FISSURE REPAIR     APPENDECTOMY  2000   COLONOSCOPY W/ POLYPECTOMY  03/2015   COLONOSCOPY WITH ESOPHAGOGASTRODUODENOSCOPY (EGD)  04/01/2015   KNEE SURGERY     OOPHORECTOMY     OOPHORECTOMY     1995, 1998   PELVIC LAPAROSCOPY     x 4 for endometriosis    VAGINAL HYSTERECTOMY  1994   Social History   Occupational History   Occupation: Therapist, sports  Tobacco Use   Smoking status: Former    Types: Cigarettes    Quit date: 08/09/2006    Years since quitting: 15.3   Smokeless tobacco: Never  Vaping Use   Vaping Use: Never used  Substance and Sexual Activity   Alcohol use: Not Currently    Comment: rare   Drug use: No   Sexual activity: Never    Birth control/protection: Surgical

## 2021-11-28 NOTE — H&P (Signed)
Alice Williams is an 61 y.o. female.   Chief Complaint: right ankle pain HPI: Alice Williams is a 61 year old patient who injured her right ankle and right hand on Friday 3 days before her current clinic appointment.  She slipped and slid down the stairs.  She has been ambulating in a wheelchair.  Oxycodone being used as needed.  Also uses ibuprofen and Tylenol.  No prior injury or surgery to the right hand or right ankle.  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  Denies any personal or family history of DVT or pulmonary embolism.  Outside radiographs and CT scans are reviewed.  She has nondisplaced fracture through the trapezium and Pisa form.  That is involving the right hand.  She also has trimalleolar ankle fracture which has been reduced and splinted.  Past Medical History:  Diagnosis Date   Anal fissure    Anemia    in early 20's   Anxiety    Arthritis    back   Breast calcifications on mammogram    Cancer ([place])    pre cancer colon,cervix   Colon polyp    Dyslipidemia 06/24/2018   Elevated cholesterol    Facet arthropathy, lumbosacral 08/28/2017   Family history of adverse reaction to anesthesia    sibling has N/V   GERD (gastroesophageal reflux disease)    Heart murmur    History of colon polyps    History of kidney stones    has small ones   IBS (irritable bowel syndrome)    Inappropriate sinus tachycardia    Migraines    Osteopenia    Osteoporosis of femur without pathological fracture 08/28/2017   Palpitations    PONV (postoperative nausea and vomiting)    Premature surgical menopause    PTSD (post-traumatic stress disorder)    Sleep apnea     Past Surgical History:  Procedure Laterality Date   ANAL FISSURE REPAIR     APPENDECTOMY  2000   COLONOSCOPY W/ POLYPECTOMY  03/2015   COLONOSCOPY WITH ESOPHAGOGASTRODUODENOSCOPY (EGD)  04/01/2015   KNEE SURGERY     OOPHORECTOMY     OOPHORECTOMY     1995, 1998   PELVIC LAPAROSCOPY     x 4 for endometriosis     VAGINAL HYSTERECTOMY  1994    Family History  Problem Relation Age of Onset   Colon cancer Mother    Breast cancer Mother 16       and liver   Migraines Mother    Hyperlipidemia Father    Alcohol abuse Father    Heart attack Father    Cancer Father        Head and neck    Hyperlipidemia Brother    Lung cancer Maternal Grandmother    Parkinson's disease Maternal Grandfather    Dementia Maternal Grandfather    Prostate cancer Maternal Grandfather    Parkinson's disease Paternal Grandmother    Heart attack Paternal Grandfather    Kidney cancer Sister 51   Migraines Sister    Migraines Sister    Esophageal cancer Neg Hx    Social History:  reports that she quit smoking about 15 years ago. Her smoking use included cigarettes. She has never used smokeless tobacco. She reports that she does not currently use alcohol. She reports that she does not use drugs.  Allergies:  Allergies  Allergen Reactions   Sulfa Antibiotics Anaphylaxis   Sumatriptan Succinate Anaphylaxis   Emgality [Galcanezumab-Gnlm] Nausea Only    GI upset -  diarrhea, nausea    Medications Prior to Admission  Medication Sig Dispense Refill   acetaminophen (TYLENOL) 500 MG tablet Take 1,000 mg by mouth 2 (two) times daily as needed for moderate pain.     aspirin-acetaminophen-caffeine (EXCEDRIN MIGRAINE) 250-250-65 MG tablet Take 1 tablet by mouth daily as needed for headache.     Atogepant 60 MG TABS Take 60 mg by mouth daily. 30 tablet 6   Botulinum Toxin Type A (BOTOX) 200 units SOLR Inject 200 Units as directed every 3 (three) months. 1 each 1   Calcium Carbonate-Vitamin D 600-400 MG-UNIT tablet Take 1 tablet by mouth 2 (two) times daily. 60 tablet 11   Coenzyme Q10 (CO Q 10 PO) Take 1 tablet by mouth daily.     escitalopram (LEXAPRO) 10 MG tablet TAKE 1 TABLET BY MOUTH ONCE DAILY 90 tablet 1   ibuprofen (ADVIL) 200 MG tablet Take 600 mg by mouth 2 (two) times daily as needed for moderate pain.     nadolol  (CORGARD) 40 MG tablet Take 1 tablet (40 mg total) by mouth daily. 90 tablet 3   oxyCODONE (OXY IR/ROXICODONE) 5 MG immediate release tablet Take 5 mg by mouth every 8 (eight) hours as needed for pain.     pantoprazole (PROTONIX) 40 MG tablet TAKE 1 TABLET BY MOUTH ONCE DAILY 30 tablet 6   polycarbophil (FIBERCON) 625 MG tablet Take 625 mg by mouth daily.     Riboflavin 400 MG CAPS Take 1 capsule by mouth daily.     Topiramate ER (TROKENDI XR) 200 MG CP24 Take 1 capsule (200 mg) by mouth daily. 90 capsule 3   traZODone (DESYREL) 50 MG tablet TAKE 1/2 - 1 TABLET BY MOUTH EVERY NIGHT AT BEDTIME AS NEEDED FOR SLEEP 30 tablet 3   BOTOX 200 units SOLR INJECT 200 UNITS AS DIRECTED EVERY 3 (THREE) MONTHS. (Patient not taking: Reported on 11/27/2021) 1 each 3    Results for orders placed or performed during the hospital encounter of 11/28/21 (from the past 48 hour(s))  Surgical pcr screen     Status: None   Collection Time: 11/28/21 11:51 AM   Specimen: Nasal Mucosa; Nasal Swab  Result Value Ref Range   MRSA, PCR NEGATIVE NEGATIVE   Staphylococcus aureus NEGATIVE NEGATIVE    Comment: (NOTE) The Xpert SA Assay (FDA approved for NASAL specimens in patients 107 years of age and older), is one component of a comprehensive surveillance program. It is not intended to diagnose infection nor to guide or monitor treatment. Performed at [facility], [place] [address] [place], [place] [address]   CBC     Status: Abnormal   Collection Time: 11/28/21 12:06 PM  Result Value Ref Range   WBC 12.3 (H) 4.0 - 10.5 K/uL   RBC 4.43 3.87 - 5.11 MIL/uL   Hemoglobin 13.3 12.0 - 15.0 g/dL   HCT 39.2 36.0 - 46.0 %   MCV 88.5 80.0 - 100.0 fL   MCH 30.0 26.0 - 34.0 pg   MCHC 33.9 30.0 - 36.0 g/dL   RDW 12.5 11.5 - 15.5 %   Platelets 215 150 - 400 K/uL   nRBC 0.0 0.0 - 0.2 %    Comment: Performed at [facility], [place] [address], [place], [place] [address]  Basic metabolic panel     Status: Abnormal    Collection Time: 11/28/21 12:06 PM  Result Value Ref Range   Sodium 138 135 - 145 mmol/L   Potassium  3.2 (L) 3.5 - 5.1 mmol/L   Chloride 110 98 - 111 mmol/L   CO2 20 (L) 22 - 32 mmol/L   Glucose, Bld 101 (H) 70 - 99 mg/dL    Comment: Glucose reference range applies only to samples taken after fasting for at least 8 hours.   BUN 11 6 - 20 mg/dL   Creatinine, Ser 0.84 0.44 - 1.00 mg/dL   Calcium 8.1 (L) 8.9 - 10.3 mg/dL   GFR, Estimated >60 >60 mL/min    Comment: (NOTE) Calculated using the CKD-EPI Creatinine Equation (2021)    Anion gap 8 5 - 15    Comment: Performed at [facility] [address], [place], [place] [address]   No results found.  Review of Systems  Musculoskeletal:  Positive for arthralgias.  All other systems reviewed and are negative.  Blood pressure 111/60, pulse 72, temperature (!) 97.3 F (36.3 C), temperature source Oral, resp. rate 20, height 5\' 7"  (1.702 m), weight 61.2 kg, SpO2 100 %. Physical Exam Vitals reviewed.  HENT:     Head: Normocephalic.     Nose: Nose normal.     Mouth/Throat:     Mouth: Mucous membranes are moist.  Eyes:     Pupils: Pupils are equal, round, and reactive to light.  Cardiovascular:     Rate and Rhythm: Normal rate.     Pulses: Normal pulses.  Pulmonary:     Effort: Pulmonary effort is normal.  Abdominal:     General: Abdomen is flat.  Musculoskeletal:     Cervical back: Normal range of motion.  Skin:    General: Skin is warm.     Capillary Refill: Capillary refill takes less than 2 seconds.  Neurological:     General: No focal deficit present.     Mental Status: She is alert.  Psychiatric:        Mood and Affect: Mood normal.    Ortho exam demonstrates Turtle tattoo on the medial aspect of the right ankle.  Pedal pulses intact.  Negative Homans no calf tenderness.  Ankle dorsiflexion plantarflexion intact.  No fracture blisters are present.  Mild swelling is present around the medial lateral malleolus.   Compartments are soft.  Right wrist is examined.  Bruising ecchymosis present on the ulnar aspect volar side of the wrist.  Overall fairly minimal pain with pronation supination flexion and extension.  Grip strength is functional.  Radial pulse intact. Assessment/Plan  Impression is nondisplaced right Pisa form and trapezial fracture.  This can be treated with a removable wrist splint which she should keep on at all times.  Nonweightbearing through the right hand.  Regarding the right ankle she has no fracture blisters and her swelling is fairly minimal.  Plan at this time is open reduction internal fixation of the trimalleolar ankle fracture.  The risk and benefits are discussed with the patient including not limited to infection nerve vessel damage potential need for hardware removal as well as development of ankle stiffness and arthritis.  Anticipate 3 weeks until beginning ankle range of motion and likely 4 weeks until beginning weightbearing.  We will write her a prescription for knee scooter.  Plan for surgery on 11/28/2021.  All questions answered  Anderson Malta, MD 11/28/2021, 2:50 PM

## 2021-11-28 NOTE — Anesthesia Procedure Notes (Signed)
Procedure Name: LMA Insertion Date/Time: 11/28/2021 3:32 PM Performed by: Barrington Ellison, CRNA Pre-anesthesia Checklist: Patient identified, Emergency Drugs available, Suction available and Patient being monitored Patient Re-evaluated:Patient Re-evaluated prior to induction Oxygen Delivery Method: Circle System Utilized Preoxygenation: Pre-oxygenation with 100% oxygen Induction Type: IV induction Ventilation: Mask ventilation without difficulty LMA: LMA inserted LMA Size: 4.0 Number of attempts: 1 Placement Confirmation: positive ETCO2 Tube secured with: Tape Dental Injury: Teeth and Oropharynx as per pre-operative assessment

## 2021-11-28 NOTE — Transfer of Care (Signed)
Immediate Anesthesia Transfer of Care Note  Patient: Alice Williams  Procedure(s) Performed: OPEN REDUCTION INTERNAL FIXATION (ORIF) ANKLE FRACTURE (Right: Ankle)  Patient Location: PACU  Anesthesia Type:General and Regional  Level of Consciousness: drowsy and patient cooperative  Airway & Oxygen Therapy: Patient Spontanous Breathing  Post-op Assessment: Report given to RN  Post vital signs: Reviewed and stable  Last Vitals:  Vitals Value Taken Time  BP 97/64 11/28/21 1809  Temp    Pulse 73 11/28/21 1810  Resp 19 11/28/21 1810  SpO2 96 % 11/28/21 1810  Vitals shown include unvalidated device data.  Last Pain:  Vitals:   11/28/21 1440  TempSrc:   PainSc: 0-No pain         Complications: No notable events documented.

## 2021-11-28 NOTE — Anesthesia Procedure Notes (Signed)
Anesthesia Regional Block: Popliteal block   Pre-Anesthetic Checklist: , timeout performed,  Correct Patient, Correct Site, Correct Laterality,  Correct Procedure, Correct Position, site marked,  Risks and benefits discussed,  Surgical consent,  Pre-op evaluation,  At surgeon's request and post-op pain management  Laterality: Right and Lower  Prep: chloraprep       Needles:  Injection technique: Single-shot  Needle Type: Echogenic Needle     Needle Length: 9cm  Needle Gauge: 21     Additional Needles:   Procedures:,,,, ultrasound used (permanent image in chart),,     Nerve Stimulator or Paresthesia:  Response: 0.45 mA Response: 0.45 mA  Additional Responses:   Narrative:  Start time: 11/28/2021 2:25 PM End time: 11/28/2021 2:28 PM Injection made incrementally with aspirations every 5 mL.  Performed by: Personally   Additional Notes: Pt identified in Holding room.  Monitors applied. Working IV access confirmed. Sterile prep R lateral knee.  #21ga ECHOgenic Arrow block needle to sciatic nerve at split in pop fossa with US guidance.  20cc 0.5% Bupivacaine with 1:200k epi injected incrementally after negative test dose.  Patient asymptomatic, VSS, no heme aspirated, tolerated well.  Jenita Seashore, MD

## 2021-11-28 NOTE — Anesthesia Preprocedure Evaluation (Addendum)
Anesthesia Evaluation  Patient identified by MRN, date of birth, ID band Patient awake    Reviewed: Allergy & Precautions, NPO status , Patient's Chart, lab work & pertinent test results  History of Anesthesia Complications (+) PONV  Airway Mallampati: II  TM Distance: >3 FB Neck ROM: Full    Dental  (+) Dental Advisory Given   Pulmonary sleep apnea and Continuous Positive Airway Pressure Ventilation , former smoker,    breath sounds clear to auscultation       Cardiovascular (-) hypertension(-) angina Rhythm:Regular Rate:Normal  '21 ECHO: EF 60-65%. The LV has normal function, no  regional wall motion abnormalities.no LVF. Grade II DD, mild TR, trivial MR   Neuro/Psych  Headaches, Anxiety Depression    GI/Hepatic Neg liver ROS, GERD  Controlled and Medicated,  Endo/Other  negative endocrine ROS  Renal/GU negative Renal ROS     Musculoskeletal   Abdominal   Peds  Hematology negative hematology ROS (+)   Anesthesia Other Findings   Reproductive/Obstetrics                            Anesthesia Physical Anesthesia Plan  ASA: 2  Anesthesia Plan: General   Post-op Pain Management: Regional block and Tylenol PO (pre-op)   Induction: Intravenous  PONV Risk Score and Plan: 4 or greater and Ondansetron, Dexamethasone and Scopolamine patch - Pre-op  Airway Management Planned: LMA  Additional Equipment: None  Intra-op Plan:   Post-operative Plan:   Informed Consent: I have reviewed the patients History and Physical, chart, labs and discussed the procedure including the risks, benefits and alternatives for the proposed anesthesia with the patient or authorized representative who has indicated his/her understanding and acceptance.     Dental advisory given  Plan Discussed with: CRNA and Surgeon  Anesthesia Plan Comments: (Plan routine monitors, GA with adductor canal and pop blocks  for post op analgesia)       Anesthesia Quick Evaluation

## 2021-11-29 ENCOUNTER — Encounter (HOSPITAL_COMMUNITY): Payer: Self-pay | Admitting: Orthopedic Surgery

## 2021-11-29 ENCOUNTER — Ambulatory Visit (HOSPITAL_BASED_OUTPATIENT_CLINIC_OR_DEPARTMENT_OTHER): Payer: No Typology Code available for payment source | Admitting: Orthopaedic Surgery

## 2021-11-29 NOTE — Anesthesia Postprocedure Evaluation (Signed)
Anesthesia Post Note  Patient: Alice Williams  Procedure(s) Performed: OPEN REDUCTION INTERNAL FIXATION (ORIF) ANKLE FRACTURE (Right: Ankle)     Patient location during evaluation: PACU Anesthesia Type: General Level of consciousness: sedated Pain management: pain level controlled Vital Signs Assessment: post-procedure vital signs reviewed and stable Respiratory status: spontaneous breathing and respiratory function stable Cardiovascular status: stable Postop Assessment: no apparent nausea or vomiting Anesthetic complications: no   No notable events documented.  Last Vitals:  Vitals:   11/28/21 1855 11/28/21 1910  BP: 115/74 124/73  Pulse: 73 68  Resp: 16 16  Temp:  36.6 C  SpO2: 98% 98%    Last Pain:  Vitals:   11/28/21 1910  TempSrc:   PainSc: 0-No pain                 Henslee Lottman DANIEL

## 2021-11-29 NOTE — Op Note (Signed)
Alice Williams, Alice Williams MEDICAL RECORD NO: [id number] ACCOUNT NO: [id number] DATE OF BIRTH: Jun 25, 1961 FACILITY: [facility] LOCATION: [facility] PHYSICIAN: Yetta Barre. Marlou Sa, MD  Operative Report   DATE OF PROCEDURE: 11/28/2021   PREOPERATIVE DIAGNOSIS:  Right trimalleolar ankle fracture.  POSTOPERATIVE DIAGNOSIS:  Right trimalleolar ankle fracture.  PROCEDURE:  Open reduction internal fixation of medial and lateral malleolar fracture using Smith and Nephew cannulated screws and a 5-hole lateral plate.  SURGEON ATTENDING: Yetta Barre. Marlou Sa, MD  ASSISTANT: Annie Main, PA  INDICATIONS:  This is a 61 year old patient with right ankle pain following injury 3 days ago, presents now for operative management after explanation of risks and benefits of trimalleolar ankle fracture.  DESCRIPTION OF PROCEDURE:  The patient was brought to the operating room where general endotracheal anesthesia was induced.  Preoperative antibiotics administered.  Timeout was called.  Right leg was prescrubbed with alcohol, Betadine, and allowed to air  dry.  Prepped with DuraPrep solution and draped in a sterile manner.  An ankle Esmarch utilized for approximately an hour and 30 minutes.  After calling timeout and covering the operative field with Ioban, incision made over the lateral malleolus.  Skin  and subcutaneous tissue sharply divided.  Care was taken to avoid injury to branches of the superficial peroneal nerve sensory branch.  Fracture was visualized, irrigated, reduced lag screw placed proximal anterior to distal posterior.  Good reduction  achieved.  5-hole plate applied.  Locking screws distally, nonlocking screws proximally.  This incision was then thoroughly irrigated and packed with a moist sponge.  Attention then directed to the medial side.  The patient had a comminuted medial  malleolar fracture with small fragments.  There was also some evidence of mild pressure on the skin, which was present today, but not  present in the clinic yesterday.  Nonetheless, the skin itself was intact.  Incision made through the middle of the  medial malleolus extending proximally and distally.  The fracture fragments were identified.  A suture was placed around the deltoid portion attachment on the anterior collicular fragment.  There was a smaller wafer piece of bone between the colliculi  and then a larger piece around the posterior colliculus, 2 cannulated screws were placed after achieving good reduction in the AP and lateral planes.  A washer was placed anteriorly.  The 0 FiberWire, which was placed around the deltoid portion of this  anterior fragment was then sutured around the screw placed above the other two cannulated screws for the medial malleolus.  This gave good fixation and kept that fragment from displacing laterally.  Good reduction was achieved in the AP and lateral  planes under fluoroscopic evaluation.  Syndesmosis stable.  Tourniquet released.  Thorough irrigation was performed.  Vancomycin powder placed.  Careful reapproximation of soft tissue over the plate was performed laterally using 2-0 Vicryl suture.  This  was followed by 3-0 Vicryl suture and 3-0 nylon.  On the medial side, careful reapproximated position of tissue using 3-0 Vicryl followed by 3-0 nylon performed.  Aquacel dressings were placed followed by a well-padded posterior splint with 5 ABDs.   Ankle in neutral dorsiflexion.  It should be noted that the posterior malleolar fracture was very well reduced and did not require fixation.  Next, the patient was transferred to the recovery room in stable condition.  Luke's assistance was required at  all times for opening, closing, mobilization of tissue.  His assistance was a medical necessity.     Elián.Darby D: 11/28/2021 6:22:58 pm  T: 11/29/2021 12:11:00 am  JOB: [id number]/ [id number]

## 2021-12-02 DIAGNOSIS — S82841D Displaced bimalleolar fracture of right lower leg, subsequent encounter for closed fracture with routine healing: Secondary | ICD-10-CM

## 2021-12-02 DIAGNOSIS — S82841A Displaced bimalleolar fracture of right lower leg, initial encounter for closed fracture: Secondary | ICD-10-CM

## 2021-12-04 NOTE — Telephone Encounter (Signed)
[facility] VOB pending

## 2021-12-06 ENCOUNTER — Other Ambulatory Visit (HOSPITAL_BASED_OUTPATIENT_CLINIC_OR_DEPARTMENT_OTHER): Payer: Self-pay

## 2021-12-06 ENCOUNTER — Other Ambulatory Visit: Payer: Self-pay

## 2021-12-06 ENCOUNTER — Encounter (HOSPITAL_BASED_OUTPATIENT_CLINIC_OR_DEPARTMENT_OTHER): Payer: Self-pay

## 2021-12-06 ENCOUNTER — Other Ambulatory Visit (HOSPITAL_COMMUNITY): Payer: Self-pay

## 2021-12-06 ENCOUNTER — Observation Stay (HOSPITAL_BASED_OUTPATIENT_CLINIC_OR_DEPARTMENT_OTHER)
Admission: EM | Admit: 2021-12-06 | Discharge: 2021-12-08 | Disposition: A | Discharge status: Home / Self Care | Payer: No Typology Code available for payment source | Attending: Internal Medicine | Admitting: Internal Medicine

## 2021-12-06 ENCOUNTER — Emergency Department (HOSPITAL_BASED_OUTPATIENT_CLINIC_OR_DEPARTMENT_OTHER): Payer: No Typology Code available for payment source

## 2021-12-06 ENCOUNTER — Emergency Department (HOSPITAL_COMMUNITY): Payer: No Typology Code available for payment source

## 2021-12-06 ENCOUNTER — Encounter: Payer: No Typology Code available for payment source | Admitting: Orthopedic Surgery

## 2021-12-06 ENCOUNTER — Emergency Department (HOSPITAL_BASED_OUTPATIENT_CLINIC_OR_DEPARTMENT_OTHER): Payer: No Typology Code available for payment source | Admitting: Radiology

## 2021-12-06 DIAGNOSIS — Z20822 Contact with and (suspected) exposure to covid-19: Secondary | ICD-10-CM | POA: Diagnosis not present

## 2021-12-06 DIAGNOSIS — S6291XD Unspecified fracture of right wrist and hand, subsequent encounter for fracture with routine healing: Secondary | ICD-10-CM | POA: Insufficient documentation

## 2021-12-06 DIAGNOSIS — Z79899 Other long term (current) drug therapy: Secondary | ICD-10-CM | POA: Diagnosis not present

## 2021-12-06 DIAGNOSIS — S62101D Fracture of unspecified carpal bone, right wrist, subsequent encounter for fracture with routine healing: Secondary | ICD-10-CM

## 2021-12-06 DIAGNOSIS — Z7982 Long term (current) use of aspirin: Secondary | ICD-10-CM | POA: Diagnosis not present

## 2021-12-06 DIAGNOSIS — S0990XA Unspecified injury of head, initial encounter: Principal | ICD-10-CM | POA: Insufficient documentation

## 2021-12-06 DIAGNOSIS — W19XXXA Unspecified fall, initial encounter: Secondary | ICD-10-CM | POA: Diagnosis not present

## 2021-12-06 DIAGNOSIS — I63511 Cerebral infarction due to unspecified occlusion or stenosis of right middle cerebral artery: Secondary | ICD-10-CM | POA: Insufficient documentation

## 2021-12-06 DIAGNOSIS — Z87891 Personal history of nicotine dependence: Secondary | ICD-10-CM | POA: Diagnosis not present

## 2021-12-06 DIAGNOSIS — F411 Generalized anxiety disorder: Secondary | ICD-10-CM | POA: Diagnosis present

## 2021-12-06 DIAGNOSIS — I639 Cerebral infarction, unspecified: Secondary | ICD-10-CM

## 2021-12-06 DIAGNOSIS — Y92009 Unspecified place in unspecified non-institutional (private) residence as the place of occurrence of the external cause: Secondary | ICD-10-CM | POA: Insufficient documentation

## 2021-12-06 DIAGNOSIS — I63231 Cerebral infarction due to unspecified occlusion or stenosis of right carotid arteries: Secondary | ICD-10-CM | POA: Insufficient documentation

## 2021-12-06 DIAGNOSIS — I7771 Dissection of carotid artery: Secondary | ICD-10-CM | POA: Insufficient documentation

## 2021-12-06 DIAGNOSIS — K219 Gastro-esophageal reflux disease without esophagitis: Secondary | ICD-10-CM | POA: Diagnosis present

## 2021-12-06 DIAGNOSIS — S82841D Displaced bimalleolar fracture of right lower leg, subsequent encounter for closed fracture with routine healing: Secondary | ICD-10-CM | POA: Diagnosis not present

## 2021-12-06 LAB — CBC WITH DIFFERENTIAL/PLATELET
Abs Immature Granulocytes: 0.09 10*3/uL — ABNORMAL HIGH (ref 0.00–0.07)
Basophils Absolute: 0 10*3/uL (ref 0.0–0.1)
Basophils Relative: 0 %
Eosinophils Absolute: 0 10*3/uL (ref 0.0–0.5)
Eosinophils Relative: 0 %
HCT: 38.7 % (ref 36.0–46.0)
Hemoglobin: 13.1 g/dL (ref 12.0–15.0)
Immature Granulocytes: 1 %
Lymphocytes Relative: 9 %
Lymphs Abs: 1 10*3/uL (ref 0.7–4.0)
MCH: 29.3 pg (ref 26.0–34.0)
MCHC: 33.9 g/dL (ref 30.0–36.0)
MCV: 86.6 fL (ref 80.0–100.0)
Monocytes Absolute: 0.3 10*3/uL (ref 0.1–1.0)
Monocytes Relative: 3 %
Neutro Abs: 10.1 10*3/uL — ABNORMAL HIGH (ref 1.7–7.7)
Neutrophils Relative %: 87 %
Platelets: 256 10*3/uL (ref 150–400)
RBC: 4.47 MIL/uL (ref 3.87–5.11)
RDW: 12.6 % (ref 11.5–15.5)
WBC: 11.6 10*3/uL — ABNORMAL HIGH (ref 4.0–10.5)
nRBC: 0 % (ref 0.0–0.2)

## 2021-12-06 LAB — BASIC METABOLIC PANEL
Anion gap: 10 (ref 5–15)
BUN: 19 mg/dL (ref 6–20)
CO2: 23 mmol/L (ref 22–32)
Calcium: 8.9 mg/dL (ref 8.9–10.3)
Chloride: 108 mmol/L (ref 98–111)
Creatinine, Ser: 0.69 mg/dL (ref 0.44–1.00)
GFR, Estimated: >60 mL/min (ref >60)
Glucose, Bld: 104 mg/dL — ABNORMAL HIGH (ref 70–99)
Potassium: 4 mmol/L (ref 3.5–5.1)
Sodium: 141 mmol/L (ref 135–145)

## 2021-12-06 MED ORDER — ONDANSETRON 4 MG PO TBDP
4.0000 mg | ORAL_TABLET | Freq: Once | ORAL | Status: AC
  Administered 2021-12-06: 4 mg via ORAL
  Filled 2021-12-06: qty 1

## 2021-12-06 NOTE — ED Notes (Signed)
Patient transported to CT 

## 2021-12-06 NOTE — ED Notes (Signed)
The pt moves all extremities

## 2021-12-06 NOTE — ED Provider Notes (Signed)
[facility] DEPT  Provider Note  CSN: [id number] Arrival date & time: 12/06/21 1440  History Chief Complaint  Patient presents with   Lytle Michaels    Mikhaela Zaugg is a 61 y.o. female with recent fall resulting in R wrist and ankle fracture. She had ORIF of the ankle about a week ago, has been home using a knee scooter. She reports some chronic weakness of the left leg due to sciatica and today while maneuvering her scooter out of the bathroom her left leg gave out and she fell backwards landing on her R hip/buttock and hitting the back of her head. She did not have LOC, but she vomited several times on the way here. Complaining of posterior headache and R buttock pain. She did not reinjure her ankle or wrist.    Home Medications Prior to Admission medications   Medication Sig Start Date End Date Taking? Authorizing Provider  aspirin (ASPIRIN CHILDRENS) 81 MG chewable tablet Chew 1 tablet (81 mg total) by mouth daily. 11/28/21 12/28/21  Magnant, Abbagayle Zaragoza L, PA-C  Atogepant 60 MG TABS Take 60 mg by mouth daily. 11/08/21   Melvenia Beam, MD  BOTOX 200 units SOLR INJECT 200 UNITS AS DIRECTED EVERY 3 (THREE) MONTHS. Patient not taking: Reported on 11/27/2021 09/28/21 09/28/22  Jaclyn Prime, Collene Leyden, PA-C  Botulinum Toxin Type A (BOTOX) 200 units SOLR Inject 200 Units as directed every 3 (three) months. 09/28/21   Jaclyn Prime, Collene Leyden, PA-C  Calcium Carbonate-Vitamin D 600-400 MG-UNIT tablet Take 1 tablet by mouth 2 (two) times daily. 08/09/17   Trixie Dredge, PA-C  Coenzyme Q10 (CO Q 10 PO) Take 1 tablet by mouth daily.    [provider]  escitalopram (LEXAPRO) 10 MG tablet TAKE 1 TABLET BY MOUTH ONCE DAILY 06/06/21 06/06/22  Saguier, Percell Miller, PA-C  ibuprofen (ADVIL) 200 MG tablet Take 600 mg by mouth 2 (two) times daily as needed for moderate pain.    [provider]  methocarbamol (ROBAXIN) 500 MG tablet Take 1 tablet (500 mg total) by mouth every 8  (eight) hours as needed. 11/28/21   Magnant, Jaydrian Corpening L, PA-C  nadolol (CORGARD) 40 MG tablet Take 1 tablet (40 mg total) by mouth daily. 09/13/21 09/13/22  Lelon Perla, MD  oxyCODONE-acetaminophen (PERCOCET) 5-325 MG tablet Take 1 tablet by mouth every 4 (four) hours as needed for severe pain. 11/28/21 11/28/22  Magnant, Kennah Hehr L, PA-C  pantoprazole (PROTONIX) 40 MG tablet TAKE 1 TABLET BY MOUTH ONCE DAILY 02/07/21 02/07/22  Jackquline Denmark, MD  polycarbophil (FIBERCON) 625 MG tablet Take 625 mg by mouth daily.    [provider]  Riboflavin 400 MG CAPS Take 1 capsule by mouth daily.    [provider]  Topiramate ER (TROKENDI XR) 200 MG CP24 Take 1 capsule (200 mg) by mouth daily. 11/08/21 11/08/22  Melvenia Beam, MD  traZODone (DESYREL) 50 MG tablet TAKE 1/2 - 1 TABLET BY MOUTH EVERY NIGHT AT BEDTIME AS NEEDED FOR SLEEP 11/08/20 11/27/21  Saguier, Percell Miller, PA-C     Allergies    Sulfa antibiotics, Sumatriptan succinate, and Emgality [galcanezumab-gnlm]   Review of Systems   Review of Systems Please see HPI for pertinent positives and negatives  Physical Exam BP 133/65    Pulse 70    Temp 98.1 F (36.7 C)    Resp 16    Ht 5\' 7"  (1.702 m)    Wt 61.2 kg    SpO2 100%  BMI 21.13 kg/m   Physical Exam Vitals and nursing note reviewed.  Constitutional:      Appearance: Normal appearance.  HENT:     Head: Normocephalic and atraumatic.     Nose: Nose normal.     Mouth/Throat:     Mouth: Mucous membranes are moist.  Eyes:     Extraocular Movements: Extraocular movements intact.     Conjunctiva/sclera: Conjunctivae normal.  Cardiovascular:     Rate and Rhythm: Normal rate.  Pulmonary:     Effort: Pulmonary effort is normal.     Breath sounds: Normal breath sounds.  Abdominal:     General: Abdomen is flat.     Palpations: Abdomen is soft.     Tenderness: There is no abdominal tenderness.  Musculoskeletal:        General: Tenderness (R buttock) present. No swelling.  Normal range of motion.     Cervical back: Neck supple. No tenderness (to mindline c-spine).     Comments: R wrist in velcro splint, R leg in post-op splint  Skin:    General: Skin is warm and dry.  Neurological:     General: No focal deficit present.     Mental Status: She is alert.  Psychiatric:        Mood and Affect: Mood normal.    ED Results / Procedures / Treatments   EKG None  Procedures Procedures  Medications Ordered in the ED Medications  ondansetron (ZOFRAN-ODT) disintegrating tablet 4 mg (4 mg Oral Given 12/06/21 1824)    Initial Impression and Plan  Patient with fall, head injury and vomiting. Will check head CT, image R hip/pelvis given pain there. Zofran for nausea. No signs of reinjury to her recent fractures.   ED Course   Clinical Course as of 12/06/21 2322  Wed Dec 06, 2021  1936 Hip/pelvis xray is normal. CT images reviewed, no traumatic injuries but there is a possible area of acute stroke on the R side that may explain her reported L leg weakness. She currently does not have any neuro deficits. Discussed with Dr. Langston Masker, [facility] who will accept for transfer. Patient is aware of plan. Will add basic labs in case there is evidence of stroke.  [CS]  2016 CBC and BMP are neg.  [CS]    Clinical Course User Index [CS] Truddie Hidden, MD     MDM Rules/Calculators/A&P Medical Decision Making Problems Addressed: Fall, initial encounter: acute illness or injury that poses a threat to life or bodily functions  Amount and/or Complexity of Data Reviewed Labs: ordered. Radiology: ordered.  Risk Prescription drug management.    Final Clinical Impression(s) / ED Diagnoses Final diagnoses:  Fall, initial encounter    Rx / DC Orders ED Discharge Orders     None        Truddie Hidden, MD 12/06/21 2322

## 2021-12-06 NOTE — ED Notes (Signed)
The pt arrived [facility] [place]  she was semt here for a mri  she fell sometime today striking her head she also fell one week ago  fractured her rt tibia and injured her rt arm   she has not felt well in the past 2 days she tripped today and struck her head   alert and oriented skin warm and dry

## 2021-12-06 NOTE — ED Notes (Signed)
To mri 

## 2021-12-06 NOTE — ED Notes (Signed)
N/V this morning after falling and hitting the back of her head. Pt denies any pain at this time.

## 2021-12-06 NOTE — ED Triage Notes (Signed)
Patient here POV from Home after Fall.  Patient fell in Late January and fractured her Wrist and Ankle. Released approximately 1 week ago.  Patient fell approximately at 0900 due to Loss of Balance. States she fell onto the [employer]. Hit her Posterior Head. No LOC.  Endorses Nausea and Emesis. Daily ASA.  NAD Noted during Triage. A&Ox4. GCS 15. Ambulatory.

## 2021-12-06 NOTE — ED Provider Notes (Signed)
11:42 PM Signed out to me at shift change.  Patient awaiting MRI for further evaluation of abnormality seen on CT.  MRI shows new infarct.   I consulted with Dr. Cheral Marker, who recommends medicine admission.  I consulted with Dr. Cyd Silence, who is appreciated for admitting.   Montine Circle, PA-C 12/07/21 Pecola Lawless    Maudie Flakes, MD 12/07/21 [phone]

## 2021-12-07 ENCOUNTER — Observation Stay (HOSPITAL_COMMUNITY): Payer: No Typology Code available for payment source

## 2021-12-07 ENCOUNTER — Encounter (HOSPITAL_COMMUNITY): Payer: Self-pay | Admitting: Internal Medicine

## 2021-12-07 ENCOUNTER — Observation Stay (HOSPITAL_BASED_OUTPATIENT_CLINIC_OR_DEPARTMENT_OTHER): Payer: No Typology Code available for payment source

## 2021-12-07 ENCOUNTER — Other Ambulatory Visit (HOSPITAL_COMMUNITY): Payer: No Typology Code available for payment source

## 2021-12-07 ENCOUNTER — Telehealth: Payer: Self-pay | Admitting: Orthopedic Surgery

## 2021-12-07 DIAGNOSIS — W19XXXA Unspecified fall, initial encounter: Secondary | ICD-10-CM

## 2021-12-07 DIAGNOSIS — K219 Gastro-esophageal reflux disease without esophagitis: Secondary | ICD-10-CM | POA: Diagnosis not present

## 2021-12-07 DIAGNOSIS — I639 Cerebral infarction, unspecified: Secondary | ICD-10-CM

## 2021-12-07 DIAGNOSIS — F411 Generalized anxiety disorder: Secondary | ICD-10-CM | POA: Diagnosis not present

## 2021-12-07 DIAGNOSIS — S62101D Fracture of unspecified carpal bone, right wrist, subsequent encounter for fracture with routine healing: Secondary | ICD-10-CM

## 2021-12-07 DIAGNOSIS — I63511 Cerebral infarction due to unspecified occlusion or stenosis of right middle cerebral artery: Secondary | ICD-10-CM

## 2021-12-07 DIAGNOSIS — S82841D Displaced bimalleolar fracture of right lower leg, subsequent encounter for closed fracture with routine healing: Secondary | ICD-10-CM

## 2021-12-07 LAB — HIV ANTIBODY (ROUTINE TESTING W REFLEX): HIV Screen 4th Generation wRfx: NONREACTIVE

## 2021-12-07 LAB — COMPREHENSIVE METABOLIC PANEL
ALT: 14 U/L (ref 0–44)
AST: 19 U/L (ref 15–41)
Albumin: 3.3 g/dL — ABNORMAL LOW (ref 3.5–5.0)
Alkaline Phosphatase: 53 U/L (ref 38–126)
Anion gap: 9 (ref 5–15)
BUN: 14 mg/dL (ref 6–20)
CO2: 19 mmol/L — ABNORMAL LOW (ref 22–32)
Calcium: 8.4 mg/dL — ABNORMAL LOW (ref 8.9–10.3)
Chloride: 110 mmol/L (ref 98–111)
Creatinine, Ser: 0.71 mg/dL (ref 0.44–1.00)
GFR, Estimated: >60 mL/min (ref >60)
Glucose, Bld: 117 mg/dL — ABNORMAL HIGH (ref 70–99)
Potassium: 3.3 mmol/L — ABNORMAL LOW (ref 3.5–5.1)
Sodium: 138 mmol/L (ref 135–145)
Total Bilirubin: 1 mg/dL (ref 0.3–1.2)
Total Protein: 5.6 g/dL — ABNORMAL LOW (ref 6.5–8.1)

## 2021-12-07 LAB — RESP PANEL BY RT-PCR (FLU A&B, COVID) ARPGX2
Influenza A by PCR: NEGATIVE
Influenza B by PCR: NEGATIVE
SARS Coronavirus 2 by RT PCR: NEGATIVE

## 2021-12-07 LAB — LIPID PANEL
Cholesterol: 171 mg/dL (ref 0–200)
HDL: 30 mg/dL — ABNORMAL LOW (ref >40)
LDL Cholesterol: 119 mg/dL — ABNORMAL HIGH (ref 0–99)
Total CHOL/HDL Ratio: 5.7 RATIO
Triglycerides: 110 mg/dL (ref <150)
VLDL: 22 mg/dL (ref 0–40)

## 2021-12-07 LAB — ECHOCARDIOGRAM COMPLETE BUBBLE STUDY
Area-P 1/2: 2.54 cm2
Calc EF: 63.2 %
S' Lateral: 3.7 cm
Single Plane A2C EF: 59.1 %
Single Plane A4C EF: 66.8 %

## 2021-12-07 MED ORDER — ATOGEPANT 60 MG PO TABS
60.0000 mg | ORAL_TABLET | Freq: Every day | ORAL | Status: DC
Start: 2021-12-07 — End: 2021-12-08

## 2021-12-07 MED ORDER — ATORVASTATIN CALCIUM 40 MG PO TABS
40.0000 mg | ORAL_TABLET | Freq: Every day | ORAL | Status: DC
  Administered 2021-12-07 – 2021-12-08 (×2): 40 mg via ORAL
  Filled 2021-12-07 (×2): qty 1

## 2021-12-07 MED ORDER — CLOPIDOGREL BISULFATE 75 MG PO TABS
75.0000 mg | ORAL_TABLET | Freq: Every day | ORAL | Status: DC
  Administered 2021-12-07 – 2021-12-08 (×2): 75 mg via ORAL
  Filled 2021-12-07 (×2): qty 1

## 2021-12-07 MED ORDER — ONDANSETRON HCL 4 MG PO TABS: 4.0000 mg | ORAL_TABLET | Freq: Four times a day (QID) | ORAL | Status: DC | PRN

## 2021-12-07 MED ORDER — ACETAMINOPHEN 325 MG PO TABS
650.0000 mg | ORAL_TABLET | Freq: Four times a day (QID) | ORAL | Status: DC | PRN
  Administered 2021-12-07 – 2021-12-08 (×2): 650 mg via ORAL
  Filled 2021-12-07 (×2): qty 2

## 2021-12-07 MED ORDER — ACETAMINOPHEN 650 MG RE SUPP: 650.0000 mg | Freq: Four times a day (QID) | RECTAL | Status: DC | PRN

## 2021-12-07 MED ORDER — ONDANSETRON HCL 4 MG/2ML IJ SOLN: 4.0000 mg | Freq: Four times a day (QID) | INTRAMUSCULAR | Status: DC | PRN

## 2021-12-07 MED ORDER — IOHEXOL 350 MG/ML SOLN
50.0000 mL | Freq: Once | INTRAVENOUS | Status: AC | PRN
  Administered 2021-12-07: 50 mL via INTRAVENOUS

## 2021-12-07 MED ORDER — CALCIUM POLYCARBOPHIL 625 MG PO TABS
625.0000 mg | ORAL_TABLET | Freq: Every day | ORAL | Status: DC
  Administered 2021-12-08: 625 mg via ORAL
  Filled 2021-12-07: qty 1

## 2021-12-07 MED ORDER — POLYETHYLENE GLYCOL 3350 17 G PO PACK: 17.0000 g | PACK | Freq: Every day | ORAL | Status: DC | PRN

## 2021-12-07 MED ORDER — METHOCARBAMOL 500 MG PO TABS: 500.0000 mg | ORAL_TABLET | Freq: Three times a day (TID) | ORAL | Status: DC | PRN

## 2021-12-07 MED ORDER — ASPIRIN 81 MG PO CHEW
81.0000 mg | CHEWABLE_TABLET | Freq: Every day | ORAL | Status: DC
  Administered 2021-12-07: 81 mg via ORAL
  Filled 2021-12-07: qty 1

## 2021-12-07 MED ORDER — GADOBUTROL 1 MMOL/ML IV SOLN
6.0000 mL | Freq: Once | INTRAVENOUS | Status: AC | PRN
  Administered 2021-12-07: 6 mL via INTRAVENOUS

## 2021-12-07 MED ORDER — TOPIRAMATE ER 200 MG PO CAP24
200.0000 mg | ORAL_CAPSULE | Freq: Every day | ORAL | Status: DC
Start: 2021-12-07 — End: 2021-12-07

## 2021-12-07 MED ORDER — TOPIRAMATE ER 100 MG PO SPRINKLE CAP24
200.0000 mg | EXTENDED_RELEASE_CAPSULE | Freq: Every day | ORAL | Status: DC
  Administered 2021-12-08: 200 mg via ORAL
  Filled 2021-12-07: qty 2

## 2021-12-07 MED ORDER — PANTOPRAZOLE SODIUM 40 MG PO TBEC
40.0000 mg | DELAYED_RELEASE_TABLET | Freq: Every day | ORAL | Status: DC
  Administered 2021-12-07 – 2021-12-08 (×2): 40 mg via ORAL
  Filled 2021-12-07 (×2): qty 1

## 2021-12-07 MED ORDER — HYDRALAZINE HCL 20 MG/ML IJ SOLN: 10.0000 mg | Freq: Four times a day (QID) | INTRAMUSCULAR | Status: DC | PRN

## 2021-12-07 MED ORDER — BUTALBITAL-APAP-CAFFEINE 50-325-40 MG PO TABS
1.0000 | ORAL_TABLET | Freq: Four times a day (QID) | ORAL | Status: DC | PRN
  Administered 2021-12-07: 1 via ORAL
  Filled 2021-12-07: qty 1

## 2021-12-07 MED ORDER — OXYCODONE-ACETAMINOPHEN 5-325 MG PO TABS: 1.0000 | ORAL_TABLET | ORAL | Status: DC | PRN

## 2021-12-07 MED ORDER — STROKE: EARLY STAGES OF RECOVERY BOOK
Freq: Once | Status: AC
  Filled 2021-12-07: qty 1

## 2021-12-07 MED ORDER — ENOXAPARIN SODIUM 40 MG/0.4ML IJ SOSY
40.0000 mg | PREFILLED_SYRINGE | INTRAMUSCULAR | Status: DC
  Administered 2021-12-07 – 2021-12-08 (×2): 40 mg via SUBCUTANEOUS
  Filled 2021-12-07 (×3): qty 0.4

## 2021-12-07 NOTE — Progress Notes (Signed)
°  Echocardiogram 2D Echocardiogram has been performed.  Alice Williams 12/07/2021, 10:48 AM

## 2021-12-07 NOTE — Evaluation (Signed)
Physical Therapy Evaluation Patient Details Name: Alice Williams MRN: [id number] DOB: November 29, 1960 Today's Date: 12/07/2021  History of Present Illness  Pt is a 61 y/o female admitted 2/8 after fall. Found with R MCA CVA.  Noted recent fall with R wrist fracture (in splint) and R ankle fractue s/p ORIF 1/31. PMH includes: migraine headaches, PTSD.  Clinical Impression  Pt admitted secondary to problem above with deficits below. Pt requiring min A +2 to perform transfers and take pivotal steps up towards [place] on LLE. Deficits noted with problem solving. Will need to further assess safety with use of knee scooter during next session. If pt unsafe, will need to determine if she will need platform RW to increase safety. Has WC at home if needed. Will continue to follow acutely and update recommendations as appropriate.      Recommendations for follow up therapy are one component of a multi-disciplinary discharge planning process, led by the attending physician.  Recommendations may be updated based on patient status, additional functional criteria and insurance authorization.  Follow Up Recommendations Outpatient PT    Assistance Recommended at Discharge Frequent or constant Supervision/Assistance  Patient can return home with the following  A little help with walking and/or transfers;A little help with bathing/dressing/bathroom;Assistance with cooking/housework;Help with stairs or ramp for entrance;Assist for transportation    Equipment Recommendations Other (comment) (TBD may need platform RW)  Recommendations for Other Services       Functional Status Assessment Patient has had a recent decline in their functional status and demonstrates the ability to make significant improvements in function in a reasonable and predictable amount of time.     Precautions / Restrictions Precautions Precautions: Fall Required Braces or Orthoses: Splint/Cast Splint/Cast: R wrist, R ankle Restrictions Weight  Bearing Restrictions: Yes RUE Weight Bearing: Weight bear through elbow only RLE Weight Bearing: Non weight bearing      Mobility  Bed Mobility Overal bed mobility: Needs Assistance Bed Mobility: Supine to Sit, Sit to Supine     Supine to sit: Min guard Sit to supine: Min guard   General bed mobility comments: for safety    Transfers Overall transfer level: Needs assistance Equipment used: 1 person hand held assist Transfers: Sit to/from Stand Sit to Stand: Min assist, +2 safety/equipment           General transfer comment: Min A for lift assist and steadying to stand. Pt pressing through LUE and R elbow to make pivotal movements on LLE up towards HOB.    Ambulation/Gait                  Stairs            Wheelchair Mobility    Modified Rankin (Stroke Patients Only)       Balance Overall balance assessment: Needs assistance Sitting-balance support: No upper extremity supported, Feet supported Sitting balance-Leahy Scale: Good     Standing balance support: Bilateral upper extremity supported, During functional activity Standing balance-Leahy Scale: Poor Standing balance comment: relies on BUE support (R elbow, L hand)                             Pertinent Vitals/Pain Pain Assessment Pain Assessment: No/denies pain    Home Living Family/patient expects to be discharged to:: Private residence Living Arrangements: Children Available Help at Discharge: Family;Available 24 hours/day Type of Home: House Home Access: Stairs to enter   [employer] of Steps: 1  Home Layout: One level Home Equipment: Wheelchair - manual;Other (comment) (knee scooter) Additional Comments: pt reports has been sponge bathing since ORIF    Prior Function Prior Level of Function : Independent/Modified Independent;History of Falls (last six months)             Mobility Comments: using knee scooter for mobility ADLs Comments: independent  ADLs     Hand Dominance   Dominant Hand: Right    Extremity/Trunk Assessment   Upper Extremity Assessment Upper Extremity Assessment: RUE deficits/detail;LUE deficits/detail RUE Deficits / Details: wrist fracture in splint, NWB wirst but otherwise WFL LUE Deficits / Details: grossly weaker than R, 4/5 MMT.  sensation and coordination WFL. mild inattention to UE    Lower Extremity Assessment Lower Extremity Assessment: RLE deficits/detail;LLE deficits/detail RLE Deficits / Details: R ankle in cast LLE Deficits / Details: Grossly 4/5 at hip flexors and 5/5 throughout remainder    Cervical / Trunk Assessment Cervical / Trunk Assessment: Normal  Communication   Communication: No difficulties  Cognition Arousal/Alertness: Awake/alert Behavior During Therapy: Flat affect Overall Cognitive Status: Impaired/Different from baseline Area of Impairment: Attention, Problem solving                   Current Attention Level: Sustained         Problem Solving: Slow processing, Requires verbal cues, Difficulty sequencing, Decreased initiation General Comments: pt oriented and following commands, she presents with slow processing and is easily distracted. At times requires repetition to answer questions. mild L intattention? (raises R UE when asked to raise L UE)        General Comments      Exercises     Assessment/Plan    PT Assessment Patient needs continued PT services  PT Problem List Decreased strength;Decreased activity tolerance;Decreased balance;Decreased mobility;Decreased cognition;Decreased safety awareness;Decreased knowledge of precautions;Decreased knowledge of use of DME       PT Treatment Interventions DME instruction;Gait training;Functional mobility training;Therapeutic activities;Therapeutic exercise;Balance training;Cognitive remediation;Neuromuscular re-education;Stair training    PT Goals (Current goals can be found in the Care Plan section)   Acute Rehab PT Goals Patient Stated Goal: to go home PT Goal Formulation: With patient Time For Goal Achievement: 12/21/21 Potential to Achieve Goals: Good    Frequency Min 4X/week     Co-evaluation PT/OT/SLP Co-Evaluation/Treatment: Yes Reason for Co-Treatment: For patient/therapist safety;To address functional/ADL transfers PT goals addressed during session: Mobility/safety with mobility;Balance OT goals addressed during session: ADL's and self-care       AM-PAC PT "6 Clicks" Mobility  Outcome Measure Help needed turning from your back to your side while in a flat bed without using bedrails?: A Little Help needed moving from lying on your back to sitting on the side of a flat bed without using bedrails?: A Little Help needed moving to and from a bed to a chair (including a wheelchair)?: A Little Help needed standing up from a chair using your arms (e.g., wheelchair or bedside chair)?: A Little Help needed to walk in hospital room?: A Lot Help needed climbing 3-5 steps with a railing? : A Lot 6 Click Score: 16    End of Session Equipment Utilized During Treatment: Gait belt Activity Tolerance: Patient tolerated treatment well Patient left: in bed;with call bell/phone within reach (on stretcher in ED) Nurse Communication: Mobility status PT Visit Diagnosis: Other abnormalities of gait and mobility (R26.89);Difficulty in walking, not elsewhere classified (R26.2)    Time: 7741-2878 PT Time Calculation (min) (ACUTE ONLY): 20 min  Charges:   PT Evaluation $PT Eval Low Complexity: 1 Low          Reuel Derby, PT, DPT  Acute Rehabilitation Services  Pager: [phone] Office: [phone]   Rudean Hitt 12/07/2021, 1:29 PM

## 2021-12-07 NOTE — Consult Note (Addendum)
NEURO HOSPITALIST CONSULT NOTE   Requestig physician: Dr. Sedonia Small  Reason for Consult: Acute stroke  History obtained from:  Patient and Chart     HPI:                                                                                                                                          Alice Williams is an 61 y.o. female with PMHx as noted below who presented to the [facility] ED from [place] on Wednesday morning for an MRI to further evaluate for possible stroke versus cortical contusion after a fall at home on the same day. She states that on awakening Wednesday she felt normal, but that while using a knee scooter in her home (had recent ORIF of the ankle, see below) that morning her left leg suddenly buckled under her with an accompanying sensation of weakness. The fall occurred at about 0900, during which she fell onto a hardwood floor hitting her posterior head without LOC. She states that later on, she was holding her left hand with her right, but somehow thought that her left hand was a toy or some other object left in her lap by children in her house; she states "I though it wasn't my hand" then later realized that she was holding it with her right. Of note, this delusion occurred despite the fact that her left hand had intact sensation - she could feel that it was being touched but did not realize that it was her own hand. At [facility] she endorsed nausea and emesis. She was ambulatory in Triage.   Of note, she had an earlier fall in late January during which she fractured her wrist and ankle on the right. She was released from the hospital about one week ago and is currently wearing a cast on her right leg.   She does endorse a history of left sciatica to which she attributed the weakness resulting in her fall.   Home medications include ASA.   MRI at [facility] revealed a patchy acute ischemic nonhemorrhagic right MCA distribution infarct.   Past Medical History:  Diagnosis Date   Anal  fissure    Anemia    in early 20's   Anxiety    Arthritis    back   Breast calcifications on mammogram    Cancer ([place])    pre cancer colon,cervix   Colon polyp    Dyslipidemia 06/24/2018   Elevated cholesterol    Facet arthropathy, lumbosacral 08/28/2017   Family history of adverse reaction to anesthesia    sibling has N/V   GERD (gastroesophageal reflux disease)    Heart murmur    History of colon polyps    History of kidney stones    has small ones   IBS (irritable bowel  syndrome)    Inappropriate sinus tachycardia    Migraines    Osteopenia    Osteoporosis of femur without pathological fracture 08/28/2017   Palpitations    PONV (postoperative nausea and vomiting)    Premature surgical menopause    PTSD (post-traumatic stress disorder)    Sleep apnea     Past Surgical History:  Procedure Laterality Date   ANAL FISSURE REPAIR     APPENDECTOMY  2000   COLONOSCOPY W/ POLYPECTOMY  03/2015   COLONOSCOPY WITH ESOPHAGOGASTRODUODENOSCOPY (EGD)  04/01/2015   KNEE SURGERY     OOPHORECTOMY     OOPHORECTOMY     1995, 1998   ORIF ANKLE FRACTURE Right 11/28/2021   Procedure: OPEN REDUCTION INTERNAL FIXATION (ORIF) ANKLE FRACTURE;  Surgeon: Meredith Pel, MD;  Location: [place];  Service: Orthopedics;  Laterality: Right;   PELVIC LAPAROSCOPY     x 4 for endometriosis    VAGINAL HYSTERECTOMY  1994    Family History  Problem Relation Age of Onset   Colon cancer Mother    Breast cancer Mother 53       and liver   Migraines Mother    Hyperlipidemia Father    Alcohol abuse Father    Heart attack Father    Cancer Father        Head and neck    Hyperlipidemia Brother    Lung cancer Maternal Grandmother    Parkinson's disease Maternal Grandfather    Dementia Maternal Grandfather    Prostate cancer Maternal Grandfather    Parkinson's disease Paternal Grandmother    Heart attack Paternal Grandfather    Kidney cancer Sister 45   Migraines Sister    Migraines Sister     Esophageal cancer Neg Hx             Social History:  reports that she quit smoking about 15 years ago. Her smoking use included cigarettes. She has never used smokeless tobacco. She reports that she does not currently use alcohol. She reports that she does not use drugs.  Allergies  Allergen Reactions   Sulfa Antibiotics Anaphylaxis   Sumatriptan Succinate Anaphylaxis   Emgality [Galcanezumab-Gnlm] Nausea Only    GI upset - diarrhea, nausea    MEDICATIONS:                                                                                                                     No current facility-administered medications on file prior to encounter.   Current Outpatient Medications on File Prior to Encounter  Medication Sig Dispense Refill   aspirin (ASPIRIN CHILDRENS) 81 MG chewable tablet Chew 1 tablet (81 mg total) by mouth daily. 30 tablet 0   Atogepant 60 MG TABS Take 60 mg by mouth daily. 30 tablet 6   BOTOX 200 units SOLR INJECT 200 UNITS AS DIRECTED EVERY 3 (THREE) MONTHS. (Patient not taking: Reported on 11/27/2021) 1 each 3   Botulinum Toxin Type A (BOTOX) 200 units SOLR Inject 200 Units as  directed every 3 (three) months. 1 each 1   Calcium Carbonate-Vitamin D 600-400 MG-UNIT tablet Take 1 tablet by mouth 2 (two) times daily. 60 tablet 11   Coenzyme Q10 (CO Q 10 PO) Take 1 tablet by mouth daily.     escitalopram (LEXAPRO) 10 MG tablet TAKE 1 TABLET BY MOUTH ONCE DAILY 90 tablet 1   ibuprofen (ADVIL) 200 MG tablet Take 600 mg by mouth 2 (two) times daily as needed for moderate pain.     methocarbamol (ROBAXIN) 500 MG tablet Take 1 tablet (500 mg total) by mouth every 8 (eight) hours as needed. 30 tablet 0   nadolol (CORGARD) 40 MG tablet Take 1 tablet (40 mg total) by mouth daily. 90 tablet 3   oxyCODONE-acetaminophen (PERCOCET) 5-325 MG tablet Take 1 tablet by mouth every 4 (four) hours as needed for severe pain. 30 tablet 0   pantoprazole (PROTONIX) 40 MG tablet TAKE 1 TABLET BY  MOUTH ONCE DAILY 30 tablet 6   polycarbophil (FIBERCON) 625 MG tablet Take 625 mg by mouth daily.     Riboflavin 400 MG CAPS Take 1 capsule by mouth daily.     Topiramate ER (TROKENDI XR) 200 MG CP24 Take 1 capsule (200 mg) by mouth daily. 90 capsule 3   traZODone (DESYREL) 50 MG tablet TAKE 1/2 - 1 TABLET BY MOUTH EVERY NIGHT AT BEDTIME AS NEEDED FOR SLEEP 30 tablet 3     ROS:                                                                                                                                       Denies vision changes, aphasia, facial droop, dysphagia or right sided weakness. Endorses a mild headache. Other ROS as per HPI.    Blood pressure 109/70, pulse 66, temperature 98.8 F (37.1 C), temperature source Oral, resp. rate 20, height 5\' 7"  (1.702 m), weight 61.2 kg, SpO2 98 %.   General Examination:                                                                                                       Physical Exam  HEENT-  Normocephalic. No facial bruising   Lungs- Respirations unlabored Extremities- Cast to RLE noted.   Neurological Examination Mental Status: Awake and alert. Speech fluent with intact naming and comprehension. Follows all commands without difficulty. Fully oriented.  Cranial Nerves: II: Temporal visual fields intact, but with intermittent extinction on the left to DSS. PERRL III,IV, VI:  No ptosis. EOMI. No nystagmus.   V: Temp sensation equal bilaterally VII: Smile symmetric except for subtle lag on the left relative to the right on initiation.  VIII: Hearing intact to voice IX,X: No hoarseness or hypophonia XI: Slight lag on the left with shoulder shrug XII: Midline tongue extension Motor: RUE 5/5 RLE 5/5 LUE 4+/5 proximal and distal strength and subtle bradykinesia LLE 5/5 Sensory: Temp and FT intact and symmetric x 4. There is intermittent extinction on the left with DSS.  Deep Tendon Reflexes: 2+ and symmetric throughout Cerebellar: No ataxia  with FNF bilaterally Gait: Deferred   Lab Results: Basic Metabolic Panel: Recent Labs  Lab 12/06/21 1938  NA 141  K 4.0  CL 108  CO2 23  GLUCOSE 104*  BUN 19  CREATININE 0.69  CALCIUM 8.9    CBC: Recent Labs  Lab 12/06/21 1938  WBC 11.6*  NEUTROABS 10.1*  HGB 13.1  HCT 38.7  MCV 86.6  PLT 256    Cardiac Enzymes: No results for input(s): CKTOTAL, CKMB, CKMBINDEX, TROPONINI in the last 168 hours.  Lipid Panel: No results for input(s): CHOL, TRIG, HDL, CHOLHDL, VLDL, LDLCALC in the last 168 hours.  Imaging: CT Head Wo Contrast  Result Date: 12/06/2021 CLINICAL DATA:  Head trauma after a fall. No loss of consciousness. Now nausea and vomiting. EXAM: CT HEAD WITHOUT CONTRAST TECHNIQUE: Contiguous axial images were obtained from the base of the skull through the vertex without intravenous contrast. RADIATION DOSE REDUCTION: This exam was performed according to the departmental dose-optimization program which includes automated exposure control, adjustment of the mA and/or kV according to patient size and/or use of iterative reconstruction technique. COMPARISON:  CT 12/20/2020.  MRI 01/18/2021 FINDINGS: Brain: There is focal area of vague low attenuation in the right frontotemporal region new since prior study. This could represent an area of acute ischemia. MRI suggested for further evaluation. No mass effect or midline shift. No abnormal extra-axial fluid collections. Gray-white matter junctions are distinct. Basal cisterns are not effaced. No acute intracranial hemorrhage. Ventricles are not dilated. Vascular: No hyperdense vessel or unexpected calcification. Skull: Calvarium appears intact. Sinuses/Orbits: Paranasal sinuses and mastoid air cells are clear. Other: None. IMPRESSION: 1. Focal low-attenuation suggested in the right frontotemporal region which may be new since prior study. Consider MRI for further evaluation. 2. No acute intracranial hemorrhage or mass effect.  Electronically Signed   By: Lucienne Capers M.D.   On: 12/06/2021 18:41   MR BRAIN WO CONTRAST  Result Date: 12/07/2021 CLINICAL DATA:  Follow-up examination for acute stroke. EXAM: MRI HEAD WITHOUT CONTRAST TECHNIQUE: Multiplanar, multiecho pulse sequences of the brain and surrounding structures were obtained without intravenous contrast. COMPARISON:  Head CT from 12/06/2021. FINDINGS: Brain: Cerebral volume within normal limits. No significant cerebral white matter disease for age. Scattered restricted diffusion seen involving the right insula and overlying right frontoparietal region, consistent with acute right MCA distribution infarct. Mild hazy diffusion signal abnormality also noted involving the right caudate and lentiform nuclei. No associated mass effect or significant hemorrhage. No other evidence for acute or subacute ischemia. Gray-white matter differentiation otherwise maintained. No other areas of chronic cortical infarction. No acute intracranial hemorrhage. Few punctate chronic micro hemorrhages noted involving the supratentorial brain, of doubtful significance. No mass lesion, midline shift or mass effect no hydrocephalus or extra-axial fluid collection. Pituitary gland suprasellar region normal. Midline structures intact. Vascular: Abnormal flow void within the petrous and cavernous right ICA, which could be related to slow flow  and/or occlusion (series 10, image 9). Major intracranial vascular flow voids are otherwise maintained. Skull and upper cervical spine: Craniocervical junction normal. Bone marrow signal intensity within normal limits. No scalp soft tissue abnormality. Sinuses/Orbits: Globes orbital soft tissues within normal limits. Mild scattered mucosal thickening noted within the ethmoidal air cells. Paranasal sinuses are otherwise clear. No mastoid effusion. Other: None. IMPRESSION: 1. Patchy acute ischemic nonhemorrhagic right MCA distribution infarct as above. No associated mass  effect. 2. Abnormal flow void within the petrous and cavernous right ICA, which could be related to slow flow and/or occlusion. 3. Otherwise normal brain MRI for age. Electronically Signed   By: Jeannine Boga M.D.   On: 12/07/2021 02:39   DG Hip Unilat With Pelvis 2-3 Views Right  Result Date: 12/06/2021 CLINICAL DATA:  Fall with right-sided hip pain EXAM: DG HIP (WITH OR WITHOUT PELVIS) 2-3V RIGHT COMPARISON:  09/15/2020 FINDINGS: SI joints are non widened. Pubic symphysis and rami are intact. No fracture or malalignment. IMPRESSION: No acute osseous abnormality. Electronically Signed   By: Donavan Foil M.D.   On: 12/06/2021 18:51     Assessment: 61 year old female with patchy acute right MCA stroke. NIHSS 3.  1. Exam reveals left sided deficits attributable to the acute stroke seen on MRI. Overall findings are not consistent with LVO.  2. MRI brain: Patchy acute ischemic nonhemorrhagic right MCA distribution infarct. No associated mass effect. Abnormal flow void within the petrous and cavernous right ICA, which could be related to slow flow and/or occlusion. 3. Out of the TNK time window. Deficits are too mild to justify risks of emergent 4-vessel angiogram for possible thrombectomy given the equivocal abnormal right petrous/cavernous ICA flow void seen on MRI.   Recommendations: 1. HgbA1c, fasting lipid panel 2. CTA of head and neck 3. PT consult, OT consult, Speech consult 4. Echocardiogram 5. Cardiac telemetry 6. Add Plavix to ASA 7. Risk factor modification 8. Atorvastatin 9. Frequent neuro checks 10. BP management with progressive goal towards normotensive after 9 AM Thursday.    Electronically signed: Dr. Kerney Elbe 12/07/2021, 6:28 AM

## 2021-12-07 NOTE — Assessment & Plan Note (Signed)
·   Limited weightbearing right lower extremity  Patient to work with PT during this hospitalization  Outpatient orthopedic surgery follow-up

## 2021-12-07 NOTE — Telephone Encounter (Signed)
Patient called to let Dr. Marlou Sa know she suffered a stroke and has been in the ED at [facility] since 8:00 pm last night.  Patient said they are doing a work up to see what caused the stroke. Patient said she thought Dr. Marlou Sa should know. The number to contact patient is [phone]

## 2021-12-07 NOTE — Assessment & Plan Note (Signed)
·   ED Imaging reveals patchy acute right MCA distribution infarct on noncontrast MRI of the brain.  On exam patient still exhibits mild left lower extremity weakness.  Performing serial neurologic checks  Monitoring patient on telemetry  Initiating antiplatelet therapy including aspirin and Plavix.  Patient was already on a daily aspirin in the outpatient setting and therefore Plavix was added per neurology recommendations  Initiating atorvastatin daily per neurology recommendations  Further imaging to include: CT angiogram of the head and neck  Obtaining hemoglobin A1c and lipid panel  Echocardiogram  PT, OT, SLP evaluation  Permissive hypertension with as needed antihypertensives only to be given if blood pressure greater than 220/115  Neurology following in consultation, their input is appreciated.

## 2021-12-07 NOTE — H&P (Signed)
History and Physical    Alice Williams PIR:[id number] DOB: 1961-02-09 DOA: 12/06/2021  PCP: Mackie Pai, PA-C  Patient coming from: Home via [place]   Chief Complaint:  Chief Complaint  Patient presents with   Fall     HPI:    61 year old female with past medical history of hyperlipidemia, gastroesophageal reflux disease, migraine headaches and PTSD who presents to [facility] emergency department status post fall with complaints of left lower extremity weakness.  Of note, patient suffered a recent right wrist fracture as well as trimalleolar right ankle fracture status post fall.  Patient underwent surgical repair of the right ankle on 1/31.  Patient has since been using a scooter to ambulate and perform her ADLs.  Patient explains that at approximately 9 AM on 2/8 she was attempting to use her scooter to get to the bathroom when she developed sudden left leg weakness causing her to fall backwards off the scooter striking her head on the floor.  At that time patient did not seek emergency medical attention.  Later that morning, patient was proceeding to go to her orthopedic follow-up appointment with her son riding in his vehicle.  Her son noted at the time that she seemed to have slurred speech.  Furthermore, she failed to recognize her left hand and that was sitting in her lap thinking it was one of her grandchildren's toys.  Furthermore, in addition to the slurred speech patient began to develop nausea and experienced sudden vomiting.  This constellation of symptoms prompted the patient to present to [facility] emergency department for initial evaluation.  Upon initial evaluation at [facility], patient underwent x-rays of the hip and pelvis that were unremarkable.  CT imaging of the head however revealed possible area of right hemispheric acute stroke.  Therefore, patient was transferred ER to ER to [facility] for further work-up.  While the [facility]  emergency department patient underwent MRI brain which revealed a patchy acute stroke in the right MCA distribution.  ER provider discussed case with Dr. Cheral Marker with neurology who agreed to evaluate patient in consultation.  The hospitalist group was then called to assess the patient for admission to the hospital.    Review of Systems:   Review of Systems  Musculoskeletal:  Positive for falls.  Neurological:  Positive for focal weakness.  All other systems reviewed and are negative.  Past Medical History:  Diagnosis Date   Anal fissure    Anemia    in early 20's   Anxiety    Arthritis    back   Breast calcifications on mammogram    Cancer ([place])    pre cancer colon,cervix   Colon polyp    Dyslipidemia 06/24/2018   Elevated cholesterol    Facet arthropathy, lumbosacral 08/28/2017   Family history of adverse reaction to anesthesia    sibling has N/V   GERD (gastroesophageal reflux disease)    Heart murmur    History of colon polyps    History of kidney stones    has small ones   IBS (irritable bowel syndrome)    Inappropriate sinus tachycardia    Migraines    Osteopenia    Osteoporosis of femur without pathological fracture 08/28/2017   Palpitations    PONV (postoperative nausea and vomiting)    Premature surgical menopause    PTSD (post-traumatic stress disorder)    Sleep apnea     Past Surgical History:  Procedure Laterality Date   ANAL  FISSURE REPAIR     APPENDECTOMY  2000   COLONOSCOPY W/ POLYPECTOMY  03/2015   COLONOSCOPY WITH ESOPHAGOGASTRODUODENOSCOPY (EGD)  04/01/2015   KNEE SURGERY     OOPHORECTOMY     OOPHORECTOMY     1995, 1998   ORIF ANKLE FRACTURE Right 11/28/2021   Procedure: OPEN REDUCTION INTERNAL FIXATION (ORIF) ANKLE FRACTURE;  Surgeon: Meredith Pel, MD;  Location: [place];  Service: Orthopedics;  Laterality: Right;   PELVIC LAPAROSCOPY     x 4 for endometriosis    VAGINAL HYSTERECTOMY  1994     reports that she quit smoking about 15  years ago. Her smoking use included cigarettes. She has never used smokeless tobacco. She reports that she does not currently use alcohol. She reports that she does not use drugs.  Allergies  Allergen Reactions   Sulfa Antibiotics Anaphylaxis   Sumatriptan Succinate Anaphylaxis   Emgality [Galcanezumab-Gnlm] Nausea Only    GI upset - diarrhea, nausea    Family History  Problem Relation Age of Onset   Colon cancer Mother    Breast cancer Mother 66       and liver   Migraines Mother    Hyperlipidemia Father    Alcohol abuse Father    Heart attack Father    Cancer Father        Head and neck    Hyperlipidemia Brother    Lung cancer Maternal Grandmother    Parkinson's disease Maternal Grandfather    Dementia Maternal Grandfather    Prostate cancer Maternal Grandfather    Parkinson's disease Paternal Grandmother    Heart attack Paternal Grandfather    Kidney cancer Sister 20   Migraines Sister    Migraines Sister    Esophageal cancer Neg Hx      Prior to Admission medications   Medication Sig Start Date End Date Taking? Authorizing Provider  acetaminophen (TYLENOL) 500 MG tablet Take 500 mg by mouth every 6 (six) hours as needed for mild pain, fever or headache.   Yes [provider]  aspirin (ASPIRIN CHILDRENS) 81 MG chewable tablet Chew 1 tablet (81 mg total) by mouth daily. 11/28/21 12/28/21 Yes Magnant, Charles L, PA-C  Atogepant 60 MG TABS Take 60 mg by mouth daily. 11/08/21  Yes Melvenia Beam, MD  Botulinum Toxin Type A (BOTOX) 200 units SOLR Inject 200 Units as directed every 3 (three) months. 09/28/21  Yes Teague Bobbye Morton, PA-C  Calcium Carbonate-Vitamin D 600-400 MG-UNIT tablet Take 1 tablet by mouth 2 (two) times daily. 08/09/17  Yes Trixie Dredge, PA-C  Coenzyme Q10 (CO Q 10 PO) Take 1 tablet by mouth daily.   Yes [provider]  escitalopram (LEXAPRO) 10 MG tablet TAKE 1 TABLET BY MOUTH ONCE DAILY Patient taking differently: Take  10 mg by mouth daily. 06/06/21 06/06/22 Yes Saguier, Percell Miller, PA-C  ibuprofen (ADVIL) 200 MG tablet Take 600 mg by mouth 2 (two) times daily as needed for moderate pain.   Yes [provider]  methocarbamol (ROBAXIN) 500 MG tablet Take 1 tablet (500 mg total) by mouth every 8 (eight) hours as needed. Patient taking differently: Take 500 mg by mouth every 8 (eight) hours as needed for muscle spasms. 11/28/21  Yes Magnant, Charles L, PA-C  nadolol (CORGARD) 40 MG tablet Take 1 tablet (40 mg total) by mouth daily. 09/13/21 09/13/22 Yes Lelon Perla, MD  oxyCODONE-acetaminophen (PERCOCET) 5-325 MG tablet Take 1 tablet by mouth every 4 (four) hours as needed  for severe pain. 11/28/21 11/28/22 Yes Magnant, Charles L, PA-C  pantoprazole (PROTONIX) 40 MG tablet TAKE 1 TABLET BY MOUTH ONCE DAILY Patient taking differently: Take 40 mg by mouth daily. 02/07/21 02/07/22 Yes Jackquline Denmark, MD  polycarbophil (FIBERCON) 625 MG tablet Take 625 mg by mouth daily.   Yes [provider]  Riboflavin 400 MG CAPS Take 400 mg by mouth daily.   Yes [provider]  Topiramate ER (TROKENDI XR) 200 MG CP24 Take 1 capsule (200 mg) by mouth daily. 11/08/21 11/08/22 Yes Melvenia Beam, MD  BOTOX 200 units SOLR INJECT 200 UNITS AS DIRECTED EVERY 3 (THREE) MONTHS. Patient not taking: Reported on 11/27/2021 09/28/21 09/28/22  Jaclyn Prime, Collene Leyden, PA-C  traZODone (DESYREL) 50 MG tablet TAKE 1/2 - 1 TABLET BY MOUTH EVERY NIGHT AT BEDTIME AS NEEDED FOR SLEEP Patient not taking: Reported on 12/07/2021 11/08/20 12/07/21  Mackie Pai, PA-C    Physical Exam: Vitals:   12/06/21 1909 12/06/21 2045 12/07/21 0029 12/07/21 0500  BP: 123/66 133/65 109/70 116/67  Pulse: 65 70 66 64  Resp: 15 16 20 18   Temp:  98.1 F (36.7 C) 98.8 F (37.1 C) 98.3 F (36.8 C)  TempSrc:   Oral   SpO2: 100% 100% 98% 98%  Weight:      Height:        Constitutional: Awake alert and oriented x3, no associated distress.   Skin: no  rashes, no lesions, good skin turgor noted. Eyes: Pupils are equally reactive to light.  No evidence of scleral icterus or conjunctival pallor.  ENMT: Moist mucous membranes noted.  Posterior pharynx clear of any exudate or lesions.   Neck: normal, supple, no masses, no thyromegaly.  No evidence of jugular venous distension.   Respiratory: clear to auscultation bilaterally, no wheezing, no crackles. Normal respiratory effort. No accessory muscle use.  Cardiovascular: Regular rate and rhythm, no murmurs / rubs / gallops. No extremity edema. 2+ pedal pulses. No carotid bruits.  Chest:   Nontender without crepitus or deformity.   Back:   Nontender without crepitus or deformity. Abdomen: Abdomen is soft and nontender.  No evidence of intra-abdominal masses.  Positive bowel sounds noted in all quadrants.   Musculoskeletal: Right foot is in a surgical boot.  Right wrist brace in place.  Otherwise no deformity upper and lower extremities.  no contractures. Normal muscle tone.  Neurologic: CN 2-12 grossly intact. Sensation intact.  4 out of 5 strength of the left lower extremity.  Patient is following all commands.  Patient is responsive to verbal stimuli.   Psychiatric: Patient exhibits normal mood with appropriate affect.  Patient seems to possess insight as to their current situation.     Labs on Admission: I have personally reviewed following labs and imaging studies -   CBC: Recent Labs  Lab 12/06/21 1938  WBC 11.6*  NEUTROABS 10.1*  HGB 13.1  HCT 38.7  MCV 86.6  PLT 409   Basic Metabolic Panel: Recent Labs  Lab 12/06/21 1938  NA 141  K 4.0  CL 108  CO2 23  GLUCOSE 104*  BUN 19  CREATININE 0.69  CALCIUM 8.9   GFR: Estimated Creatinine Clearance: 72.3 mL/min (by C-G formula based on SCr of 0.69 mg/dL). Liver Function Tests: No results for input(s): AST, ALT, ALKPHOS, BILITOT, PROT, ALBUMIN in the last 168 hours. No results for input(s): LIPASE, AMYLASE in the last 168  hours. No results for input(s): AMMONIA in the last 168 hours. Coagulation Profile:  No results for input(s): INR, PROTIME in the last 168 hours. Cardiac Enzymes: No results for input(s): CKTOTAL, CKMB, CKMBINDEX, TROPONINI in the last 168 hours. BNP (last 3 results) No results for input(s): PROBNP in the last 8760 hours. HbA1C: No results for input(s): HGBA1C in the last 72 hours. CBG: No results for input(s): GLUCAP in the last 168 hours. Lipid Profile: No results for input(s): CHOL, HDL, LDLCALC, TRIG, CHOLHDL, LDLDIRECT in the last 72 hours. Thyroid Function Tests: No results for input(s): TSH, T4TOTAL, FREET4, T3FREE, THYROIDAB in the last 72 hours. Anemia Panel: No results for input(s): VITAMINB12, FOLATE, FERRITIN, TIBC, IRON, RETICCTPCT in the last 72 hours. Urine analysis:    Component Value Date/Time   COLORURINE YELLOW 08/29/2018 1139   APPEARANCEUR CLEAR 08/29/2018 1139   LABSPEC 1.021 08/29/2018 1139   PHURINE < OR = 5.0 08/29/2018 1139   GLUCOSEU NEGATIVE 08/29/2018 1139   HGBUR NEGATIVE 08/29/2018 1139   KETONESUR NEGATIVE 08/29/2018 1139   PROTEINUR NEGATIVE 08/29/2018 1139   NITRITE NEGATIVE 08/29/2018 1139   LEUKOCYTESUR 1+ (A) 08/29/2018 1139    Radiological Exams on Admission - Personally Reviewed: CT Head Wo Contrast  Result Date: 12/06/2021 CLINICAL DATA:  Head trauma after a fall. No loss of consciousness. Now nausea and vomiting. EXAM: CT HEAD WITHOUT CONTRAST TECHNIQUE: Contiguous axial images were obtained from the base of the skull through the vertex without intravenous contrast. RADIATION DOSE REDUCTION: This exam was performed according to the departmental dose-optimization program which includes automated exposure control, adjustment of the mA and/or kV according to patient size and/or use of iterative reconstruction technique. COMPARISON:  CT 12/20/2020.  MRI 01/18/2021 FINDINGS: Brain: There is focal area of vague low attenuation in the right  frontotemporal region new since prior study. This could represent an area of acute ischemia. MRI suggested for further evaluation. No mass effect or midline shift. No abnormal extra-axial fluid collections. Gray-white matter junctions are distinct. Basal cisterns are not effaced. No acute intracranial hemorrhage. Ventricles are not dilated. Vascular: No hyperdense vessel or unexpected calcification. Skull: Calvarium appears intact. Sinuses/Orbits: Paranasal sinuses and mastoid air cells are clear. Other: None. IMPRESSION: 1. Focal low-attenuation suggested in the right frontotemporal region which may be new since prior study. Consider MRI for further evaluation. 2. No acute intracranial hemorrhage or mass effect. Electronically Signed   By: Lucienne Capers M.D.   On: 12/06/2021 18:41   MR BRAIN WO CONTRAST  Result Date: 12/07/2021 CLINICAL DATA:  Follow-up examination for acute stroke. EXAM: MRI HEAD WITHOUT CONTRAST TECHNIQUE: Multiplanar, multiecho pulse sequences of the brain and surrounding structures were obtained without intravenous contrast. COMPARISON:  Head CT from 12/06/2021. FINDINGS: Brain: Cerebral volume within normal limits. No significant cerebral white matter disease for age. Scattered restricted diffusion seen involving the right insula and overlying right frontoparietal region, consistent with acute right MCA distribution infarct. Mild hazy diffusion signal abnormality also noted involving the right caudate and lentiform nuclei. No associated mass effect or significant hemorrhage. No other evidence for acute or subacute ischemia. Gray-white matter differentiation otherwise maintained. No other areas of chronic cortical infarction. No acute intracranial hemorrhage. Few punctate chronic micro hemorrhages noted involving the supratentorial brain, of doubtful significance. No mass lesion, midline shift or mass effect no hydrocephalus or extra-axial fluid collection. Pituitary gland suprasellar  region normal. Midline structures intact. Vascular: Abnormal flow void within the petrous and cavernous right ICA, which could be related to slow flow and/or occlusion (series 10, image 9). Major intracranial vascular flow  voids are otherwise maintained. Skull and upper cervical spine: Craniocervical junction normal. Bone marrow signal intensity within normal limits. No scalp soft tissue abnormality. Sinuses/Orbits: Globes orbital soft tissues within normal limits. Mild scattered mucosal thickening noted within the ethmoidal air cells. Paranasal sinuses are otherwise clear. No mastoid effusion. Other: None. IMPRESSION: 1. Patchy acute ischemic nonhemorrhagic right MCA distribution infarct as above. No associated mass effect. 2. Abnormal flow void within the petrous and cavernous right ICA, which could be related to slow flow and/or occlusion. 3. Otherwise normal brain MRI for age. Electronically Signed   By: Jeannine Boga M.D.   On: 12/07/2021 02:39   DG Hip Unilat With Pelvis 2-3 Views Right  Result Date: 12/06/2021 CLINICAL DATA:  Fall with right-sided hip pain EXAM: DG HIP (WITH OR WITHOUT PELVIS) 2-3V RIGHT COMPARISON:  09/15/2020 FINDINGS: SI joints are non widened. Pubic symphysis and rami are intact. No fracture or malalignment. IMPRESSION: No acute osseous abnormality. Electronically Signed   By: Donavan Foil M.D.   On: 12/06/2021 18:51     Assessment/Plan  Assessment and Plan: * Acute right MCA stroke [facility])- (present on admission) ED Imaging reveals patchy acute right MCA distribution infarct on noncontrast MRI of the brain. On exam patient still exhibits mild left lower extremity weakness.  Performing serial neurologic checks Monitoring patient on telemetry Initiating antiplatelet therapy including aspirin and Plavix.  Patient was already on a daily aspirin in the outpatient setting and therefore Plavix was added per neurology recommendations Initiating atorvastatin daily per  neurology recommendations Further imaging to include: CT angiogram of the head and neck Obtaining hemoglobin A1c and lipid panel Echocardiogram PT, OT, SLP evaluation Permissive hypertension with as needed antihypertensives only to be given if blood pressure greater than 220/115 Neurology following in consultation, their input is appreciated.   Bimalleolar ankle fracture, right, closed, with routine healing, subsequent encounter Limited weightbearing right lower extremity Patient to work with PT during this hospitalization Outpatient orthopedic surgery follow-up  Right wrist fracture, with routine healing, subsequent encounter Outpatient orthopedic surgery follow-up  GERD without esophagitis- (present on admission) Continuing home regimen of daily PPI therapy.   GAD (generalized anxiety disorder)- (present on admission) Continue psychotropic regimen       Code Status:  Full code  code status decision has been confirmed with: patient Family Communication: deferred   Status is: Observation The patient remains OBS appropriate and will d/c before 2 midnights.          Vernelle Emerald MD [facility] Pager [phone]  If 7PM-7AM, please contact night-coverage [email or web address] Use universal [place] password for that web site. If you do not have the password, please call the hospital operator.  12/07/2021, 8:35 AM

## 2021-12-07 NOTE — Assessment & Plan Note (Signed)
·   Outpatient orthopedic surgery follow-up

## 2021-12-07 NOTE — Assessment & Plan Note (Signed)
Continuing home regimen of daily PPI therapy.  

## 2021-12-07 NOTE — ED Notes (Signed)
Pt transported to CT ?

## 2021-12-07 NOTE — Progress Notes (Signed)
PROGRESS NOTE  Alice Williams  DOB: 03-20-1961  PCP: Mackie Pai, PA-C CBS:[id number]  DOA: 12/06/2021  LOS: 0 days  Hospital Day: 2  Chief Complaint  Patient presents with   Fall    Brief narrative: Alice Williams is a 61 y.o. female with PMH significant for history of SVT, migraine headaches, PTSD, anxiety, irritable bowel syndrome, GERD, left sciatica who recently had a fall causing right wrist fracture, right trimalleolar ankle fracture and underwent surgical repair of right ankle on 1/31.  Postoperatively, patient has been using a scooter to ambulate. Patient presented to the ED on 12/06/2021 with complaint of left lower extremity leading to fall. Patient states that on the morning of 2/8, while attempting to use a scooter to go to the bathroom, she had a sudden left leg weakness and fell backwards after a scooter, striking her head on the floor.  Later in the morning, patient's son noted her to have slurred speech.  She failed to recognize her left hand.  She started having nausea and vomiting and hence she was brought to the ED at [facility].  In the ED, patient was hemodynamically stable x-rays of the hip and pelvis that were unremarkable.   CT head showed possible acute stroke in the right hemisphere.  She was transferred to ED at [facility].  CTA head and neck showed an evolving infarct of the right MCA territory and an occlusion of the right M3 MCA branch, 70% stenosis of the mid right cervical ICA MRI brain showed patchy acute stroke in the right MCA distribution.   Neurology was consulted.  She was out of TNK time window. Admitted to hospitalist service for stroke work-up.  Subjective: Patient was seen and examined this morning.  Pleasant middle-aged Caucasian female.  In the ED waiting for bed upstairs.  Not in distress.  Left lower extremity weakness gradually improving. Chart reviewed.  Assessment/Plan: Acute right MCA stroke  -Presented with sudden weakness of left  lower extremity leading to fall  -CT head and MRI brain as above showing acute stroke in the right MCA distribution -Admitted for stroke work-up -echocardiogram showed EF of 60 to 65%, no interatrial shunt. -In light of recent right leg surgery, ultrasound duplex scan of lower extremity was done which did not show any DVT.  Transcranial Doppler bubble study did not show any evidence of PFO. -A1c, LDL elevated 139 -Neurology consultation appreciated.  Punctate allergy of stroke at this time. -Pending PT/OT/ST eval -Prior to admission, patient was on aspirin 81 mg daily after recent surgery for DVT prophylaxis.  Neurology recommended DAPT with aspirin 81 mg daily and Plavix 75 mg daily.  Also added Lipitor 40 mg daily.  Right cervical ICA stenosis -CTA neck noted 70% stenosis of right cervical ICA.  Neurology discussed with interventional radiology.  Noted plan to do MRA head and neck.  Hyperlipidemia -Lipitor 40 mg daily.  History of SVT -Patient has history of SVT, follows up with Dr. Harl Bowie as an outpatient.  She had a 24-hour Holter monitoring in the past.  Patient continues to complain of intermittent heart palpitation. -Neurology team discussed with cardiology for loop recorder placement.  History of migraine -On Topamax 200 mg daily. -Complaining of headache this morning.  Started on a trial of Fioricet   Recent bimalleolar ankle fracture -Underwent surgical repair on 1/31. -Currently has limited weightbearing right lower extremity -Home meds include Robaxin 5 mg 3 times daily as needed -PT to follow -Outpatient orthopedic surgery follow-up   Right  wrist fracture, -Outpatient orthopedic surgery follow-up   GERD without esophagitis -Continue PPI     GAD (generalized anxiety disorder) -Continue Lexapro 10 mg daily,   It is unclear to me at this time why the patient is on nadolol.   Mobility: PT eval obtained. Goals of care   Code Status: Full Code   Nutritional  status:  Body mass index is 21.13 kg/m.      Diet:  Diet Order             Diet Heart Room service appropriate? Yes; Fluid consistency: Thin  Diet effective now                  DVT prophylaxis:  enoxaparin (LOVENOX) injection 40 mg Start: 12/07/21 1000   Antimicrobials: None Fluid: None Consultants: Neurology, cardiology Family Communication: None at bedside  Status is: Observation  Continue in-hospital care because: Ongoing stroke work-up Level of care: Telemetry Medical   Dispo: The patient is from: Home              Anticipated d/c is to: Home with PT              Patient currently is not medically stable to d/c.   Difficult to place patient No     Infusions:    Scheduled Meds:  aspirin  81 mg Oral Daily   Atogepant  60 mg Oral Daily   atorvastatin  40 mg Oral Daily   clopidogrel  75 mg Oral Daily   enoxaparin (LOVENOX) injection  40 mg Subcutaneous Q24H   pantoprazole  40 mg Oral Daily   polycarbophil  625 mg Oral Daily   Topiramate ER  200 mg Oral Daily    PRN meds: acetaminophen **OR** acetaminophen, butalbital-acetaminophen-caffeine, hydrALAZINE, methocarbamol, ondansetron **OR** ondansetron (ZOFRAN) IV, oxyCODONE-acetaminophen, polyethylene glycol   Antimicrobials: Anti-infectives (From admission, onward)    None       Objective: Vitals:   12/07/21 0500 12/07/21 1209  BP: 116/67 (!) 145/82  Pulse: 64 61  Resp: 18 16  Temp: 98.3 F (36.8 C)   SpO2: 98% 100%    Intake/Output Summary (Last 24 hours) at 12/07/2021 1700 Last data filed at 12/07/2021 1529 Gross per 24 hour  Intake --  Output 350 ml  Net -350 ml   Filed Weights   12/06/21 1524  Weight: 61.2 kg   Weight change:  Body mass index is 21.13 kg/m.   Physical Exam: General exam: Pleasant, middle-aged Caucasian female. Skin: No rashes, lesions or ulcers. HEENT: Atraumatic, normocephalic, no obvious bleeding Lungs: Clear to auscultation bilaterally CVS: Regular rate  and rhythm, no murmur GI/Abd soft, nontender, nondistended, some present CNS: Alert, awake, oriented x3.  Left leg weakness improving Psychiatry: Mood appropriate Extremities: No pedal edema, no calf tenderness.  Right leg in cast from recent surgery.  Right wrist with the splint on.  Data Review: I have personally reviewed the laboratory data and studies available.  F/u labs ordered Unresulted Labs (From admission, onward)     Start     Ordered   12/08/21 0500  Comprehensive metabolic panel  Tomorrow morning,   R        12/07/21 0835   12/08/21 0500  Magnesium  Tomorrow morning,   R        12/07/21 0835   12/08/21 0500  CBC WITH DIFFERENTIAL  Tomorrow morning,   R        12/07/21 0835   12/07/21 0834  Comprehensive metabolic panel  (  Labs)  Once,   R        12/07/21 0835   12/07/21 0833  HIV Antibody (routine testing w rflx)  (HIV Antibody (Routine testing w reflex) panel)  Once,   R        12/07/21 0835   12/07/21 0813  Lipid panel  Add-on,   AD        12/07/21 0813   12/07/21 0813  Hemoglobin A1c  Add-on,   AD        12/07/21 0813            Signed, Terrilee Croak, MD [facility] 12/07/2021

## 2021-12-07 NOTE — ED Notes (Signed)
The pt passed the swallow screen at 0500am  food given she still has a headache

## 2021-12-07 NOTE — Progress Notes (Signed)
TCD bubble study has been completed.   Preliminary results in CV Proc.   Dvaughn Fickle Karrissa Parchment 12/07/2021 2:15 PM

## 2021-12-07 NOTE — ED Notes (Signed)
Received verbal report from Mykenzie C RN at this time 

## 2021-12-07 NOTE — Evaluation (Addendum)
Occupational Therapy Evaluation Patient Details Name: Alice Williams MRN: [id number] DOB: 1961-10-09 Today's Date: 12/07/2021   History of Present Illness Pt is a 61 y/o female admitted 2/8 after fall. Found with R MCA CVA.  Noted recent fall with R wrist fracture (in splint) and R ankle fractue s/p ORIF 1/31. PMH includes: migraine headaches, PTSD.   Clinical Impression   PTA patient independent using knee scooter for mobility and ADLs due to recent ORIF to R ankle (NWB). She was admitted for above and is limited by problem list below, including impaired balance, decreased activity tolerance, L inattention?, and impaired attention/problem solving.  Patient currently requires setup to mod assist for ADLs, min assist +2 for transfers. She will benefit from further OT services while admitted and after dc at outpatient OT level (neuro) in order to optimize independence, safety and return to PLOF.      Recommendations for follow up therapy are one component of a multi-disciplinary discharge planning process, led by the attending physician.  Recommendations may be updated based on patient status, additional functional criteria and insurance authorization.   Follow Up Recommendations  Outpatient OT    Assistance Recommended at Discharge Intermittent Supervision/Assistance  Patient can return home with the following A lot of help with walking and/or transfers;A lot of help with bathing/dressing/bathroom;Assistance with cooking/housework;Direct supervision/assist for medications management;Direct supervision/assist for financial management;Help with stairs or ramp for entrance;Assist for transportation    Functional Status Assessment  Patient has had a recent decline in their functional status and demonstrates the ability to make significant improvements in function in a reasonable and predictable amount of time.  Equipment Recommendations  None recommended by OT    Recommendations for Other Services  Speech consult     Precautions / Restrictions Precautions Precautions: Fall Required Braces or Orthoses: Splint/Cast Splint/Cast: R wrist, R ankle Restrictions Weight Bearing Restrictions: Yes RUE Weight Bearing: Weight bear through elbow only RLE Weight Bearing: Non weight bearing      Mobility Bed Mobility Overal bed mobility: Needs Assistance Bed Mobility: Supine to Sit, Sit to Supine     Supine to sit: Min guard Sit to supine: Min guard   General bed mobility comments: for safety    Transfers                          Balance Overall balance assessment: Needs assistance Sitting-balance support: No upper extremity supported, Feet supported Sitting balance-Leahy Scale: Good     Standing balance support: Bilateral upper extremity supported, During functional activity Standing balance-Leahy Scale: Poor Standing balance comment: relies on BUE support (R elbow, L hand)                           ADL either performed or assessed with clinical judgement   ADL Overall ADL's : Needs assistance/impaired     Grooming: Set up;Sitting           Upper Body Dressing : Set up;Sitting   Lower Body Dressing: Moderate assistance;Sit to/from stand;+2 for safety/equipment   Toilet Transfer: Minimal assistance;+2 for safety/equipment;+2 for physical assistance Toilet Transfer Details (indicate cue type and reason): simulated         Functional mobility during ADLs: Minimal assistance;+2 for physical assistance;+2 for safety/equipment       Vision Baseline Vision/History: 1 Wears glasses Ability to See in Adequate Light: 0 Adequate Patient Visual Report: No change from baseline Vision Assessment?: Yes Eye  Alignment: Within Functional Limits Ocular Range of Motion: Within Functional Limits Alignment/Gaze Preference: Within Defined Limits Tracking/Visual Pursuits: Other (comment);Able to track stimulus in all quads without difficulty (mild  nystagmus with lateral tracking L and R) Visual Fields: No apparent deficits     Perception     Praxis      Pertinent Vitals/Pain Pain Assessment Pain Assessment: No/denies pain     Hand Dominance Right   Extremity/Trunk Assessment Upper Extremity Assessment Upper Extremity Assessment: RUE deficits/detail;LUE deficits/detail RUE Deficits / Details: wrist fracture in splint, NWB wirst but otherwise WFL LUE Deficits / Details: grossly weaker than R, 4/5 MMT.  sensation and coordination WFL. mild inattention to UE   Lower Extremity Assessment Lower Extremity Assessment: Defer to PT evaluation   Cervical / Trunk Assessment Cervical / Trunk Assessment: Normal   Communication Communication Communication: No difficulties   Cognition Arousal/Alertness: Awake/alert Behavior During Therapy: Flat affect Overall Cognitive Status: Impaired/Different from baseline Area of Impairment: Attention, Problem solving                   Current Attention Level: Sustained         Problem Solving: Slow processing, Requires verbal cues, Difficulty sequencing, Decreased initiation General Comments: pt oriented and following commands, she presents with slow processing and is easily distracted. At times requires repetition to answer questions. mild L intattention? (raises R UE when asked to raise L UE)     General Comments       Exercises     Shoulder Instructions      Home Living Family/patient expects to be discharged to:: Private residence Living Arrangements: Children Available Help at Discharge: Family;Available 24 hours/day Type of Home: House Home Access: Stairs to enter [employer] of Steps: 1   Home Layout: One level     Bathroom Shower/Tub: Teacher, early years/pre: Standard     Home Equipment: Wheelchair - Education administrator (comment) (knee scooter)   Additional Comments: pt reports has been sponge bathing since ORIF      Prior  Functioning/Environment Prior Level of Function : Independent/Modified Independent;History of Falls (last six months)             Mobility Comments: using knee scooter for mobility ADLs Comments: independent ADLs        OT Problem List: Decreased strength;Decreased activity tolerance;Impaired balance (sitting and/or standing);Decreased cognition;Decreased safety awareness;Decreased knowledge of use of DME or AE;Decreased knowledge of precautions;Impaired UE functional use;Impaired vision/perception      OT Treatment/Interventions: Self-care/ADL training;Neuromuscular education;DME and/or AE instruction;Therapeutic activities;Cognitive remediation/compensation;Visual/perceptual remediation/compensation;Patient/family education;Balance training    OT Goals(Current goals can be found in the care plan section) Acute Rehab OT Goals Patient Stated Goal: home OT Goal Formulation: With patient Time For Goal Achievement: 12/21/21 Potential to Achieve Goals: Good  OT Frequency: Min 3X/week    Co-evaluation PT/OT/SLP Co-Evaluation/Treatment: Yes Reason for Co-Treatment: For patient/therapist safety;To address functional/ADL transfers   OT goals addressed during session: ADL's and self-care      AM-PAC OT "6 Clicks" Daily Activity     Outcome Measure Help from another person eating meals?: A Little Help from another person taking care of personal grooming?: A Little Help from another person toileting, which includes using toliet, bedpan, or urinal?: A Lot Help from another person bathing (including washing, rinsing, drying)?: A Little Help from another person to put on and taking off regular upper body clothing?: A Little Help from another person to put on and taking off regular lower body  clothing?: A Lot 6 Click Score: 16   End of Session Nurse Communication: Mobility status  Activity Tolerance: Patient tolerated treatment well Patient left: with call bell/phone within reach;in  bed  OT Visit Diagnosis: Other abnormalities of gait and mobility (R26.89);Muscle weakness (generalized) (M62.81);History of falling (Z91.81);Other symptoms and signs involving the nervous system (R29.898)                Time: 5465-6812 OT Time Calculation (min): 20 min Charges:  OT General Charges $OT Visit: 1 Visit OT Evaluation $OT Eval Moderate Complexity: 1 Mod  Jolaine Artist, OT Acute Rehabilitation Services Pager [phone] Office [phone]   Delight Stare 12/07/2021, 1:14 PM

## 2021-12-07 NOTE — Progress Notes (Addendum)
STROKE TEAM PROGRESS NOTE   INTERVAL HISTORY Patient is seen in the emergency department with no family at the bedside.  Yesterday, she fell from her knee scooter (patient has had a recent fall with ORIF of right ankle) after experiencing left leg weakness.  Later that day, she thought that her left hand was an object placed in her lap, although she still had sensation in her hand at the time.  MRI reveals right ischemic MCA infarct.  Vitals:   12/06/21 2045 12/07/21 0029 12/07/21 0500 12/07/21 1209  BP: 133/65 109/70 116/67 (!) 145/82  Pulse: 70 66 64 61  Resp: 16 20 18 16   Temp: 98.1 F (36.7 C) 98.8 F (37.1 C) 98.3 F (36.8 C)   TempSrc:  Oral    SpO2: 100% 98% 98% 100%  Weight:      Height:       CBC:  Recent Labs  Lab 12/06/21 1938  WBC 11.6*  NEUTROABS 10.1*  HGB 13.1  HCT 38.7  MCV 86.6  PLT 235   Basic Metabolic Panel:  Recent Labs  Lab 12/06/21 1938  NA 141  K 4.0  CL 108  CO2 23  GLUCOSE 104*  BUN 19  CREATININE 0.69  CALCIUM 8.9   Lipid Panel: No results for input(s): CHOL, TRIG, HDL, CHOLHDL, VLDL, LDLCALC in the last 168 hours. HgbA1c: No results for input(s): HGBA1C in the last 168 hours. Urine Drug Screen: No results for input(s): LABOPIA, COCAINSCRNUR, LABBENZ, AMPHETMU, THCU, LABBARB in the last 168 hours.  Alcohol Level No results for input(s): ETH in the last 168 hours.  IMAGING past 24 hours CT ANGIO HEAD NECK W WO CM  Result Date: 12/07/2021 CLINICAL DATA:  Stroke/TIA, determine embolic source EXAM: CT ANGIOGRAPHY HEAD AND NECK TECHNIQUE: Multidetector CT imaging of the head and neck was performed using the standard protocol during bolus administration of intravenous contrast. Multiplanar CT image reconstructions and MIPs were obtained to evaluate the vascular anatomy. Carotid stenosis measurements (when applicable) are obtained utilizing NASCET criteria, using the distal internal carotid diameter as the denominator. RADIATION DOSE REDUCTION:  This exam was performed according to the departmental dose-optimization program which includes automated exposure control, adjustment of the mA and/or kV according to patient size and/or use of iterative reconstruction technique. CONTRAST:  38mL OMNIPAQUE IOHEXOL 350 MG/ML SOLN COMPARISON:  12/06/2021 FINDINGS: CT HEAD Brain: There is no acute intracranial hemorrhage. Hypoattenuation with loss of gray-white differentiation is present in the right MCA territory involving parietal and temporal lobes and insula. There is also involvement of the body of the caudate and a portion of the lentiform nucleus. Ventricles and sulci are normal in size and configuration. No extra-axial collection. Vascular: Linear hyperdensity in the area of infarction probably reflects intraluminal thrombus. Skull: Calvarium is unremarkable. Sinuses/Orbits: No acute finding. Other: None. Review of the MIP images confirms the above findings CTA NECK Aortic arch: Great vessel origins are patent. Right carotid system: Patent. There is irregularity and narrowing of the mildly tortuous cervical ICA beyond the origin. Thickening along the wall reflecting fibrofatty plaque or mural thrombus. There is some exophytic plaque or adherent thrombus. Stenosis measures up to 70%. Left carotid system: Patent.  No stenosis. Vertebral arteries: Patent. Left vertebral is dominant. No stenosis. Skeleton: Left facet predominant cervical spine degenerative changes. Other neck: Unremarkable. Upper chest: Included upper lungs are clear. Review of the MIP images confirms the above findings CTA HEAD Anterior circulation: Intracranial internal carotid arteries are patent. Proximal right middle cerebral  artery is patent. There is occlusion of a right M3 segment in the region of curvilinear hyperdensity on noncontrast CT. Left middle and both anterior cerebral arteries are patent. Posterior circulation: Intracranial vertebral arteries are patent. Basilar artery is patent.  Major cerebellar artery origins are patent. Posterior cerebral arteries are patent. Venous sinuses: Patent as allowed by contrast bolus timing. Review of the MIP images confirms the above findings IMPRESSION: No acute intracranial hemorrhage. Evolving acute infarction of the right MCA territory involving parietal and temporal lobes, insula, and basal ganglia. Abnormal appearance of the mid right cervical ICA with circumferential fibrofatty plaque resulting in 70% stenosis. There is a focal exophytic component that may reflect unstable plaque or adherent thrombus. Alternatively, this may represent dissection with mural thrombus. Occlusion of a right M3 MCA branch. This corresponds to hyperdense vessel on noncontrast portion. Electronically Signed   By: Macy Mis M.D.   On: 12/07/2021 09:34   CT Head Wo Contrast  Result Date: 12/06/2021 CLINICAL DATA:  Head trauma after a fall. No loss of consciousness. Now nausea and vomiting. EXAM: CT HEAD WITHOUT CONTRAST TECHNIQUE: Contiguous axial images were obtained from the base of the skull through the vertex without intravenous contrast. RADIATION DOSE REDUCTION: This exam was performed according to the departmental dose-optimization program which includes automated exposure control, adjustment of the mA and/or kV according to patient size and/or use of iterative reconstruction technique. COMPARISON:  CT 12/20/2020.  MRI 01/18/2021 FINDINGS: Brain: There is focal area of vague low attenuation in the right frontotemporal region new since prior study. This could represent an area of acute ischemia. MRI suggested for further evaluation. No mass effect or midline shift. No abnormal extra-axial fluid collections. Gray-white matter junctions are distinct. Basal cisterns are not effaced. No acute intracranial hemorrhage. Ventricles are not dilated. Vascular: No hyperdense vessel or unexpected calcification. Skull: Calvarium appears intact. Sinuses/Orbits: Paranasal sinuses  and mastoid air cells are clear. Other: None. IMPRESSION: 1. Focal low-attenuation suggested in the right frontotemporal region which may be new since prior study. Consider MRI for further evaluation. 2. No acute intracranial hemorrhage or mass effect. Electronically Signed   By: Lucienne Capers M.D.   On: 12/06/2021 18:41   MR BRAIN WO CONTRAST  Result Date: 12/07/2021 CLINICAL DATA:  Follow-up examination for acute stroke. EXAM: MRI HEAD WITHOUT CONTRAST TECHNIQUE: Multiplanar, multiecho pulse sequences of the brain and surrounding structures were obtained without intravenous contrast. COMPARISON:  Head CT from 12/06/2021. FINDINGS: Brain: Cerebral volume within normal limits. No significant cerebral white matter disease for age. Scattered restricted diffusion seen involving the right insula and overlying right frontoparietal region, consistent with acute right MCA distribution infarct. Mild hazy diffusion signal abnormality also noted involving the right caudate and lentiform nuclei. No associated mass effect or significant hemorrhage. No other evidence for acute or subacute ischemia. Gray-white matter differentiation otherwise maintained. No other areas of chronic cortical infarction. No acute intracranial hemorrhage. Few punctate chronic micro hemorrhages noted involving the supratentorial brain, of doubtful significance. No mass lesion, midline shift or mass effect no hydrocephalus or extra-axial fluid collection. Pituitary gland suprasellar region normal. Midline structures intact. Vascular: Abnormal flow void within the petrous and cavernous right ICA, which could be related to slow flow and/or occlusion (series 10, image 9). Major intracranial vascular flow voids are otherwise maintained. Skull and upper cervical spine: Craniocervical junction normal. Bone marrow signal intensity within normal limits. No scalp soft tissue abnormality. Sinuses/Orbits: Globes orbital soft tissues within normal limits.  Mild scattered  mucosal thickening noted within the ethmoidal air cells. Paranasal sinuses are otherwise clear. No mastoid effusion. Other: None. IMPRESSION: 1. Patchy acute ischemic nonhemorrhagic right MCA distribution infarct as above. No associated mass effect. 2. Abnormal flow void within the petrous and cavernous right ICA, which could be related to slow flow and/or occlusion. 3. Otherwise normal brain MRI for age. Electronically Signed   By: Jeannine Boga M.D.   On: 12/07/2021 02:39   DG Hip Unilat With Pelvis 2-3 Views Right  Result Date: 12/06/2021 CLINICAL DATA:  Fall with right-sided hip pain EXAM: DG HIP (WITH OR WITHOUT PELVIS) 2-3V RIGHT COMPARISON:  09/15/2020 FINDINGS: SI joints are non widened. Pubic symphysis and rami are intact. No fracture or malalignment. IMPRESSION: No acute osseous abnormality. Electronically Signed   By: Donavan Foil M.D.   On: 12/06/2021 18:51   VAS [place] LOWER EXTREMITY VENOUS (DVT)  Result Date: 12/07/2021  Lower Venous DVT Study Patient Name:  KANOELANI DOBIES  Date of Exam:   12/07/2021 Medical Rec #: [id number]     Accession #:    [id number] Date of Birth: 1961-07-09     Patient Gender: F Patient Age:   66 years Exam Location:  [facility] [place] Procedure:      VAS [place] LOWER EXTREMITY VENOUS (DVT) Referring Phys: Cornelius Moras Norval Slaven --------------------------------------------------------------------------------  Indications: Stroke.  Limitations: Rt leg calf. Comparison Study: no prior Performing Technologist: Archie Patten RVS  Examination Guidelines: A complete evaluation includes B-mode imaging, spectral Doppler, color Doppler, and power Doppler as needed of all accessible portions of each vessel. Bilateral testing is considered an integral part of a complete examination. Limited examinations for reoccurring indications may be performed as noted. The reflux portion of the exam is performed with the patient in reverse Trendelenburg.   +---------+---------------+---------+-----------+----------+-------------------+  RIGHT     Compressibility Phasicity Spontaneity Properties Thrombus Aging       +---------+---------------+---------+-----------+----------+-------------------+  CFV       Full            Yes       Yes                                         +---------+---------------+---------+-----------+----------+-------------------+  SFJ       Full                                                                  +---------+---------------+---------+-----------+----------+-------------------+  FV Prox   Full                                                                  +---------+---------------+---------+-----------+----------+-------------------+  FV Mid    Full                                                                  +---------+---------------+---------+-----------+----------+-------------------+  FV Distal Full                                                                  +---------+---------------+---------+-----------+----------+-------------------+  PFV       Full                                                                  +---------+---------------+---------+-----------+----------+-------------------+  POP       Full            Yes       Yes                                         +---------+---------------+---------+-----------+----------+-------------------+  PTV                                                        Not well visualized  +---------+---------------+---------+-----------+----------+-------------------+  PERO                                                       Not well visualized  +---------+---------------+---------+-----------+----------+-------------------+   +---------+---------------+---------+-----------+----------+--------------+  LEFT      Compressibility Phasicity Spontaneity Properties Thrombus Aging  +---------+---------------+---------+-----------+----------+--------------+  CFV       Full             Yes       Yes                                    +---------+---------------+---------+-----------+----------+--------------+  SFJ       Full                                                             +---------+---------------+---------+-----------+----------+--------------+  FV Prox   Full                                                             +---------+---------------+---------+-----------+----------+--------------+  FV Mid    Full                                                             +---------+---------------+---------+-----------+----------+--------------+  FV Distal Full                                                             +---------+---------------+---------+-----------+----------+--------------+  PFV       Full                                                             +---------+---------------+---------+-----------+----------+--------------+  POP       Full            Yes       Yes                                    +---------+---------------+---------+-----------+----------+--------------+  PTV       Full                                                             +---------+---------------+---------+-----------+----------+--------------+  PERO      Full                                                             +---------+---------------+---------+-----------+----------+--------------+    Summary: RIGHT: - There is no evidence of deep vein thrombosis in the lower extremity. However, portions of this examination were limited- see technologist comments above.  - No cystic structure found in the popliteal fossa.  LEFT: - There is no evidence of deep vein thrombosis in the lower extremity.  - No cystic structure found in the popliteal fossa.  *See table(s) above for measurements and observations.    Preliminary     PHYSICAL EXAM General:  Alert, well-developed, well-nourished patient in no acute distress.  Patient has right wrist brace and cast on right leg.   NEURO:   Mental Status: AA&Ox3  Speech/Language: speech is without dysarthria or aphasia.    Cranial Nerves:  II: PERRL. Visual fields full.  III, IV, VI: EOMI. Eyelids elevate symmetrically.  V: Sensation is intact to light touch and diminished on right side of face.  VII: Smile is symmetrical. Able to puff cheeks and raise eyebrows.  VIII: hearing intact to voice. IX, X: Phonation is normal.  XII: tongue is midline without fasciculations. Motor: 5/5 strength to all muscle groups tested.  Tone: is normal and bulk is normal Sensation- Intact to light touch bilaterally. Left sided extinction to DSS present.  Coordination: FTN intact bilaterally.  No drift.  Gait- deferred   ASSESSMENT/PLAN Ms. Lynix Bonine is a 61 y.o. female with history of HLD, GERD, migraines and PTSD presenting with left lower extremity weakness and left sided neglect.  Yesterday, she fell from her knee scooter (patient has had a  recent fall with ORIF of right ankle) after experiencing left leg weakness.  Later that day, she thought that her left hand was an object placed in her lap, although she still had sensation in her hand at the time.  MRI reveals right ischemic MCA infarct.  Stroke:  right MCA infarct, embolic pattern, source unclear CT head Focal low-attenuation in right frontotemporal region, no acute hemorrhage or mass effect CTA head & neck Evolving infarct of right MCA territory including parietal and temporal lobes, insula and basal ganglia, occlusion of right M3 MCA branch, 70% stenosis of mid right cervical ICA MRI  patchy acute ischemic right MCA distribution infarct, abnormal flow in petrous and cavernous right ICA 2D Echo EF 60-65%, small pericardial effusion, no evidence of interatrial shunt Lower extremity doppler (excluding right calf) No evidence of DVT TCD bubble study no PFO LDL pending HgbA1c pending VTE prophylaxis - lovenox aspirin 81 mg daily prior to admission, now on aspirin 81 mg daily and  clopidogrel 75 mg daily.  Therapy recommendations:  outpt PT Disposition:  pending  BP management Home meds:  none Stable Long-term BP goal normotensive  Hyperlipidemia Home meds:  none LDL 156 10/2020 LDL pending, goal < 70 Add Atorvastatin 40 mg daily  Continue statin at discharge  Other Stroke Risk Factors Former cigarette smoker Migraines  Other Active Problems Right wrist and ankle fracture, s/p ORIF of right ankle Follow up with orthopedic surgeon as outpatient  Hospital day # [place] , MSN, AGACNP-BC [facility]  See Amion for schedule and pager information 12/07/2021 12:55 PM   ATTENDING NOTE: I reviewed above note and agree with the assessment and plan. Pt was seen and examined.   61 year old female with history of hyperlipidemia, migraine, IBS, OSA, PTSD, recent right ankle fracture s/p ORIF now on cast admitted for left leg and hand weakness, left arm neglect, falling at home.  CT questionable right MCA hypodensity.  MRI confirmed right MCA patchy infarct.  CTA head and neck right MCA occlusion, right mid cervical ICA torturous with irregular shape, with 70% stenosis.  LDL and A1c pending.  TCD bubble study no PFO, LE venous Doppler no DVT.  Creatinine 0.69.  Patient had a history of SVT, follows with Dr. Stanford Breed cardiology.  Had a 24-hour Holter monitoring in the past.  Patient still complaining of intermittent heart palpitation.  On exam, patient left wrist and left foreleg in brace/cast.  Awake alert, orientated x3, no aphasia.  Neurologically intact, no focal deficit.  I further discussed with Dr. Norma Fredrickson interventional radiology, patient right ICA mid cervical portion torturous and with small caliber concerning for inflammation versus fibrofatty plaque versus thrombus versus dissection.  Will do MRA neck with and without contrast with P1 suppression for dissection protocol.  We also do MRA head in the same time.  Etiology for  patient stroke not quite clear.  Will waiting for MRA head and neck findings.  If negative, will recommend loop recorder to rule out A-fib given history of SVT and continued intermittent heart palpitation.  Currently on aspirin and Plavix DAPT.  Put on Lipitor 40.  PT/OT recommend outpatient PT.  We will follow  For detailed assessment and plan, please refer to above as I have made changes wherever appropriate.   Rosalin Hawking, MD PhD Stroke Neurology 12/07/2021 2:57 PM   To contact Stroke Continuity provider, please refer to [email or web address] After hours, contact General Neurology

## 2021-12-07 NOTE — Assessment & Plan Note (Signed)
·   Continue psychotropic regimen

## 2021-12-07 NOTE — ED Notes (Signed)
Pt back from CT

## 2021-12-07 NOTE — ED Notes (Signed)
RN assisted pt w/ bedpan, UOP 393ml

## 2021-12-07 NOTE — ED Notes (Signed)
Patient transported to Ultrasound 

## 2021-12-07 NOTE — Progress Notes (Signed)
Lower extremity venous has been completed.   Preliminary results in CV Proc.   Jinny Blossom Ruger Saxer 12/07/2021 10:11 AM

## 2021-12-08 ENCOUNTER — Other Ambulatory Visit (HOSPITAL_BASED_OUTPATIENT_CLINIC_OR_DEPARTMENT_OTHER): Payer: Self-pay

## 2021-12-08 ENCOUNTER — Other Ambulatory Visit (HOSPITAL_COMMUNITY): Payer: Self-pay

## 2021-12-08 ENCOUNTER — Encounter: Payer: Self-pay | Admitting: Neurology

## 2021-12-08 ENCOUNTER — Telehealth: Payer: Self-pay | Admitting: Orthopedic Surgery

## 2021-12-08 DIAGNOSIS — I7771 Dissection of carotid artery: Secondary | ICD-10-CM | POA: Diagnosis not present

## 2021-12-08 DIAGNOSIS — I63511 Cerebral infarction due to unspecified occlusion or stenosis of right middle cerebral artery: Secondary | ICD-10-CM | POA: Diagnosis not present

## 2021-12-08 DIAGNOSIS — I63131 Cerebral infarction due to embolism of right carotid artery: Secondary | ICD-10-CM | POA: Diagnosis not present

## 2021-12-08 LAB — CBC WITH DIFFERENTIAL/PLATELET
Abs Immature Granulocytes: 0.04 10*3/uL (ref 0.00–0.07)
Basophils Absolute: 0.1 10*3/uL (ref 0.0–0.1)
Basophils Relative: 1 %
Eosinophils Absolute: 0.3 10*3/uL (ref 0.0–0.5)
Eosinophils Relative: 2 %
HCT: 34.5 % — ABNORMAL LOW (ref 36.0–46.0)
Hemoglobin: 11.7 g/dL — ABNORMAL LOW (ref 12.0–15.0)
Immature Granulocytes: 0 %
Lymphocytes Relative: 19 %
Lymphs Abs: 2 10*3/uL (ref 0.7–4.0)
MCH: 29.8 pg (ref 26.0–34.0)
MCHC: 33.9 g/dL (ref 30.0–36.0)
MCV: 88 fL (ref 80.0–100.0)
Monocytes Absolute: 0.7 10*3/uL (ref 0.1–1.0)
Monocytes Relative: 7 %
Neutro Abs: 7.4 10*3/uL (ref 1.7–7.7)
Neutrophils Relative %: 71 %
Platelets: 231 10*3/uL (ref 150–400)
RBC: 3.92 MIL/uL (ref 3.87–5.11)
RDW: 12.6 % (ref 11.5–15.5)
WBC: 10.4 10*3/uL (ref 4.0–10.5)
nRBC: 0 % (ref 0.0–0.2)

## 2021-12-08 LAB — COMPREHENSIVE METABOLIC PANEL
ALT: 14 U/L (ref 0–44)
AST: 20 U/L (ref 15–41)
Albumin: 3 g/dL — ABNORMAL LOW (ref 3.5–5.0)
Alkaline Phosphatase: 62 U/L (ref 38–126)
Anion gap: 7 (ref 5–15)
BUN: 14 mg/dL (ref 6–20)
CO2: 21 mmol/L — ABNORMAL LOW (ref 22–32)
Calcium: 8.3 mg/dL — ABNORMAL LOW (ref 8.9–10.3)
Chloride: 110 mmol/L (ref 98–111)
Creatinine, Ser: 0.85 mg/dL (ref 0.44–1.00)
GFR, Estimated: >60 mL/min (ref >60)
Glucose, Bld: 96 mg/dL (ref 70–99)
Potassium: 3.3 mmol/L — ABNORMAL LOW (ref 3.5–5.1)
Sodium: 138 mmol/L (ref 135–145)
Total Bilirubin: 0.7 mg/dL (ref 0.3–1.2)
Total Protein: 5.3 g/dL — ABNORMAL LOW (ref 6.5–8.1)

## 2021-12-08 LAB — MAGNESIUM: Magnesium: 2.3 mg/dL (ref 1.7–2.4)

## 2021-12-08 MED ORDER — POTASSIUM CHLORIDE CRYS ER 20 MEQ PO TBCR
40.0000 meq | EXTENDED_RELEASE_TABLET | Freq: Once | ORAL | Status: AC
Start: 2021-12-08 — End: 2021-12-08
  Administered 2021-12-08: 40 meq via ORAL
  Filled 2021-12-08: qty 2

## 2021-12-08 MED ORDER — CLOPIDOGREL BISULFATE 75 MG PO TABS
75.0000 mg | ORAL_TABLET | Freq: Every day | ORAL | 0 refills | Status: AC
  Filled 2021-12-08 (×2): qty 30, 30d supply, fill #0

## 2021-12-08 MED ORDER — ASPIRIN EC 325 MG PO TBEC
325.0000 mg | DELAYED_RELEASE_TABLET | Freq: Every day | ORAL | Status: DC
  Administered 2021-12-08: 325 mg via ORAL
  Filled 2021-12-08: qty 1

## 2021-12-08 MED ORDER — ASPIRIN 81 MG PO CHEW
81.0000 mg | CHEWABLE_TABLET | Freq: Every day | ORAL | 0 refills | Status: AC
  Filled 2021-12-08 (×2): qty 90, 90d supply, fill #0

## 2021-12-08 MED ORDER — ATORVASTATIN CALCIUM 40 MG PO TABS
40.0000 mg | ORAL_TABLET | Freq: Every day | ORAL | 0 refills | Status: DC
Start: 2021-12-09 — End: 2022-01-22
  Filled 2021-12-08 (×2): qty 30, 30d supply, fill #0

## 2021-12-08 NOTE — Progress Notes (Signed)
Physical Therapy Treatment Patient Details Name: Alice Williams MRN: [id number] DOB: 27-May-1961 Today's Date: 12/08/2021   History of Present Illness Pt is a 61 y/o female admitted 2/8 after fall. Found with R MCA CVA.  Noted recent fall with R wrist fracture (in splint) and R ankle fractue s/p ORIF 1/31. PMH includes: migraine headaches, PTSD.    PT Comments    Pt progressing well towards goals. Patient able to transfer with min assist for steadying onto knee scooter from EOB. Patient ambulated well on knee scooter, however, with increased fatigue in LLE and required standing rest. Patient needed min assist for steadying from knee scooter to EOB and required multimodal cues for sequencing. Patient reported decreased comfort with use of platform walker with transfers and hop steps. PT recommending 3in1 for safe toileting at home. Current recommendations for outpatient neuro PT appropriate. PT will continue to follow acutely.    Recommendations for follow up therapy are one component of a multi-disciplinary discharge planning process, led by the attending physician.  Recommendations may be updated based on patient status, additional functional criteria and insurance authorization.  Follow Up Recommendations  Outpatient PT     Assistance Recommended at Discharge Frequent or constant Supervision/Assistance  Patient can return home with the following A little help with walking and/or transfers;A little help with bathing/dressing/bathroom;Assistance with cooking/housework;Help with stairs or ramp for entrance;Assist for transportation   Equipment Recommendations  BSC/3in1    Recommendations for Other Services       Precautions / Restrictions Precautions Precautions: Fall Required Braces or Orthoses: Splint/Cast Splint/Cast: R wrist, R ankle Restrictions Weight Bearing Restrictions: Yes RUE Weight Bearing: Weight bear through elbow only RLE Weight Bearing: Non weight bearing      Mobility  Bed Mobility Overal bed mobility: Needs Assistance Bed Mobility: Supine to Sit, Sit to Supine     Supine to sit: Supervision Sit to supine: Supervision   General bed mobility comments: for safety    Transfers Overall transfer level: Needs assistance Equipment used: Ambulation equipment used, Right platform walker (knee scooter) Transfers: Sit to/from Stand Sit to Stand: Min assist, +2 safety/equipment           General transfer comment: Min A for lift assist and steadying to stand. Pt pressing through LUE from bed and handheld assist for steadying. She reached forward to use her RUE to steady herself on her knee scooter. Increased difficulty when transferring off of knee scooter back to bed and required multimodal cues for sequencing. Performed standing 2nd time with platform walker with min assist. Patient reported decreased comfort with use of platform walker.    Ambulation/Gait Ambulation/Gait assistance: Min guard, +2 safety/equipment Gait Distance (Feet): 200 Feet Assistive device: Right platform walker (and knee scooter) Gait Pattern/deviations: Trunk flexed Gait velocity: decreased Gait velocity interpretation: <1.8 ft/sec, indicate of risk for recurrent falls   General Gait Details: trunk flexed foward due resting R elbow on knee scooter bar in front of her. Patient demonstrating good safety awareness with use of knee scooter. No LOB noted. However patient fatiguing quickly and required 1 standing rest. Performed a few hop steps using platform walker, however, pt with increased unsteadiness and decreased comfort.   Stairs             Wheelchair Mobility    Modified Rankin (Stroke Patients Only) Modified Rankin (Stroke Patients Only) Pre-Morbid Rankin Score: Slight disability Modified Rankin: Moderately severe disability     Balance Overall balance assessment: Needs assistance Sitting-balance support: No upper  extremity supported, Feet  supported Sitting balance-Leahy Scale: Good     Standing balance support: Bilateral upper extremity supported, During functional activity Standing balance-Leahy Scale: Poor Standing balance comment: relies on BUE support (R elbow, L hand) and min assist for steadying due to increase sway on unilateral LE support with standing                            Cognition Arousal/Alertness: Awake/alert Behavior During Therapy: Flat affect Overall Cognitive Status: Impaired/Different from baseline Area of Impairment: Problem solving                             Problem Solving: Slow processing, Requires verbal cues, Difficulty sequencing, Decreased initiation          Exercises      General Comments General comments (skin integrity, edema, etc.): discussed using wheelchair for increased safety when pt more fatigued. Pt agreeable.      Pertinent Vitals/Pain Pain Assessment Pain Assessment: Faces Faces Pain Scale: No hurt    Home Living Family/patient expects to be discharged to:: Private residence Living Arrangements: Children Available Help at Discharge: Family;Available 24 hours/day Type of Home: House Home Access: Stairs to enter   [employer] of Steps: 1   Home Layout: One level Home Equipment: Wheelchair - manual;Other (comment) (knee scooter) Additional Comments: pt reports has been sponge bathing since ORIF    Prior Function            PT Goals (current goals can now be found in the care plan section) Acute Rehab PT Goals Patient Stated Goal: to go home PT Goal Formulation: With patient Time For Goal Achievement: 12/21/21 Potential to Achieve Goals: Good Progress towards PT goals: Progressing toward goals    Frequency    Min 4X/week      PT Plan Current plan remains appropriate    Co-evaluation              AM-PAC PT "6 Clicks" Mobility   Outcome Measure  Help needed turning from your back to your side while in  a flat bed without using bedrails?: A Little Help needed moving from lying on your back to sitting on the side of a flat bed without using bedrails?: A Little Help needed moving to and from a bed to a chair (including a wheelchair)?: A Little Help needed standing up from a chair using your arms (e.g., wheelchair or bedside chair)?: A Little Help needed to walk in hospital room?: A Little Help needed climbing 3-5 steps with a railing? : A Lot 6 Click Score: 17    End of Session Equipment Utilized During Treatment: Gait belt Activity Tolerance: Patient tolerated treatment well;Patient limited by fatigue Patient left: in bed (on edge of ED stretcher in sitting with OT present) Nurse Communication: Mobility status PT Visit Diagnosis: Other abnormalities of gait and mobility (R26.89);Difficulty in walking, not elsewhere classified (R26.2)     Time: 1342-1400 PT Time Calculation (min) (ACUTE ONLY): 18 min  Charges:  $Gait Training: 8-22 mins                     Jonne Ply, SPT    [place] 12/08/2021, 4:03 PM

## 2021-12-08 NOTE — Progress Notes (Signed)
Occupational Therapy Treatment Patient Details Name: Alice Williams MRN: [id number] DOB: [birth year] Today's Date: 12/08/2021   History of present illness Pt is a 61 y/o female admitted 2/8 after fall. Found with R MCA CVA.  Noted recent fall with R wrist fracture (in splint) and R ankle fractue s/p ORIF 1/31. PMH includes: migraine headaches, PTSD.   OT comments  Pt with PT upon entry, fatigued and plans to dc home today with sons support.  Discussed bathroom transfers and safety with ADLs, recommend 3:1 for dc home now.  Reviewed lateral lean techniques for LB bathing, toileting and dressing due to impaired balance. Discussed use of 3:1 over commode vs at bedside when fatigued/using wc for mobility. She verbalized understanding.  Completing transfers with min guard, good adherence to precautions to R UE/LE.  Pt agreeable to have son assist with med mgmt at dc.  Will follow acutely, continue to recommend OP OT.    Recommendations for follow up therapy are one component of a multi-disciplinary discharge planning process, led by the attending physician.  Recommendations may be updated based on patient status, additional functional criteria and insurance authorization.    Follow Up Recommendations  Outpatient OT    Assistance Recommended at Discharge Intermittent Supervision/Assistance  Patient can return home with the following  Assistance with cooking/housework;Direct supervision/assist for medications management;Direct supervision/assist for financial management;Help with stairs or ramp for entrance;Assist for transportation;A little help with walking and/or transfers;A little help with bathing/dressing/bathroom   Equipment Recommendations  BSC/3in1    Recommendations for Other Services Speech consult    Precautions / Restrictions Precautions Precautions: Fall Required Braces or Orthoses: Splint/Cast Splint/Cast: R wrist, R ankle Restrictions Weight Bearing Restrictions: Yes RUE Weight  Bearing: Weight bear through elbow only RLE Weight Bearing: Non weight bearing       Mobility Bed Mobility Overal bed mobility: Needs Assistance Bed Mobility: Supine to Sit, Sit to Supine     Supine to sit: Supervision Sit to supine: Supervision   General bed mobility comments: for safety    Transfers                         Balance Overall balance assessment: Needs assistance Sitting-balance support: No upper extremity supported, Feet supported Sitting balance-Leahy Scale: Good     Standing balance support: Bilateral upper extremity supported, During functional activity Standing balance-Leahy Scale: Poor Standing balance comment: relies on BUE support (R elbow, L hand)                           ADL either performed or assessed with clinical judgement   ADL Overall ADL's : Needs assistance/impaired     Grooming: Set up;Sitting               Lower Body Dressing: Minimal assistance;Sitting/lateral leans Lower Body Dressing Details (indicate cue type and reason): reviewed techniques for lateral leans with dressing for increased safety due to impaired balance Toilet Transfer: Minimal assistance Toilet Transfer Details (indicate cue type and reason): completed with platform RW on R side, pt voices preference to knee scooter use.  Would benefit from 3:1 commode   Toileting - Clothing Manipulation Details (indicate cue type and reason): dicussed lateral leans with toileting as well     Functional mobility during ADLs: Minimal assistance      Extremity/Trunk Assessment              Vision  Perception     Praxis      Cognition Arousal/Alertness: Awake/alert Behavior During Therapy: Flat affect Overall Cognitive Status: Impaired/Different from baseline Area of Impairment: Attention, Problem solving                   Current Attention Level: Selective         Problem Solving: Slow processing, Requires verbal cues,  Difficulty sequencing, Decreased initiation          Exercises      Shoulder Instructions       General Comments discussed safety recommendations, pt agreeable to have son assist with medicaitons and plans to start using a pill box    Pertinent Vitals/ Pain       Pain Assessment Pain Assessment: Faces Faces Pain Scale: No hurt  Home Living     Available Help at Discharge: Family;Available 24 hours/day Type of Home: House                                  Prior Functioning/Environment              Frequency  Min 3X/week        Progress Toward Goals  OT Goals(current goals can now be found in the care plan section)  Progress towards OT goals: Progressing toward goals  Acute Rehab OT Goals Patient Stated Goal: home OT Goal Formulation: With patient  Plan Discharge plan remains appropriate;Frequency remains appropriate    Co-evaluation                 AM-PAC OT "6 Clicks" Daily Activity     Outcome Measure   Help from another person eating meals?: A Little Help from another person taking care of personal grooming?: A Little Help from another person toileting, which includes using toliet, bedpan, or urinal?: A Little Help from another person bathing (including washing, rinsing, drying)?: A Little Help from another person to put on and taking off regular upper body clothing?: A Little Help from another person to put on and taking off regular lower body clothing?: A Little 6 Click Score: 18    End of Session    OT Visit Diagnosis: Other abnormalities of gait and mobility (R26.89);Muscle weakness (generalized) (M62.81);History of falling (Z91.81);Other symptoms and signs involving the nervous system (R29.898)   Activity Tolerance Patient tolerated treatment well   Patient Left in bed;with call bell/phone within reach   Nurse Communication Mobility status        Time: 1355-1409 OT Time Calculation (min): 14 min  Charges: OT  General Charges $OT Visit: 1 Visit OT Treatments $Self Care/Home Management : 8-22 mins  Jolaine Artist, OT [place] Pager [phone] Office [phone]   Delight Stare 12/08/2021, 2:40 PM

## 2021-12-08 NOTE — Discharge Summary (Signed)
Physician Discharge Summary  Alice Williams CVE:[id number] DOB: 09/17/1961 DOA: 12/06/2021  PCP: Mackie Pai, PA-C  Admit date: 12/06/2021 Discharge date: 12/09/2021  Admitted From: Home Discharge disposition: Home with outpatient PT   Brief narrative: Alice Williams is a 61 y.o. female with PMH significant for history of SVT, migraine headaches, PTSD, anxiety, irritable bowel syndrome, GERD, left sciatica who recently had a fall causing right wrist fracture, right trimalleolar ankle fracture and underwent surgical repair of right ankle on 1/31.  Postoperatively, patient has been using a scooter to ambulate. Patient presented to the ED on 12/06/2021 with complaint of left lower extremity leading to fall. Patient states that on the morning of 2/8, while attempting to use a scooter to go to the bathroom, she had a sudden left leg weakness and fell backwards after a scooter, striking her head on the floor.  Later in the morning, patient's son noted her to have slurred speech.  She failed to recognize her left hand.  She started having nausea and vomiting and hence she was brought to the ED at [facility].  In the ED, patient was hemodynamically stable x-rays of the hip and pelvis that were unremarkable.   CT head showed possible acute stroke in the right hemisphere.  She was transferred to ED at [facility].  CTA head and neck showed an evolving infarct of the right MCA territory and an occlusion of the right M3 MCA branch, 70% stenosis of the mid right cervical ICA MRI brain showed patchy acute stroke in the right MCA distribution.   Neurology was consulted.  She was out of TNK time window. Admitted to hospitalist service for stroke work-up.  Subjective: Patient was seen and examined this morning.   Lying on stretcher in the ED.  Not in distress.  No new symptoms.  Left leg weakness improved.  Discharge diagnosis: Principal Problem:   Acute right MCA stroke (HCC) Active Problems:   GAD  (generalized anxiety disorder)   Bimalleolar ankle fracture, right, closed, with routine healing, subsequent encounter   GERD without esophagitis   Right wrist fracture, with routine healing, subsequent encounter  Hospital course: Acute right MCA stroke  -Presented with sudden weakness of left lower extremity leading to fall  -CT head and MRI brain as above showing acute stroke in the right MCA distribution -Admitted for stroke work-up -echocardiogram showed EF of 60 to 65%, no interatrial shunt. -In light of recent right leg surgery, ultrasound duplex scan of lower extremity was done which did not show any DVT.  Transcranial Doppler bubble study did not show any evidence of PFO. -A1c, LDL elevated 139 -Neurology consultation appreciated.  Punctate allergy of stroke at this time. -Pending PT/OT/ST eval -Prior to admission, patient was on aspirin 81 mg daily after recent surgery for DVT prophylaxis.  Neurology recommended DAPT with aspirin 81 mg daily and Plavix 75 mg daily.  Also added Lipitor 40 mg daily.  Right cervical ICA stenosis Right ICA dissection -CTA neck noted 70% stenosis of right cervical ICA. Neurology discussed with interventional radiology.   -MRA head and neck was obtained which confirmed the same finding.  -per neurology, patient probably suffered right ICA dissection when she fell 2 weeks ago.  At that time, she had extensive pain in the right neck and right shoulder. The day before this admission, she was washing her face and with significant pain at right neck and she had pulsatile tinnitus in the right ear the night before current admission. This is consistent  with right ICA dissection. -Continue DAPT with ASA 325 and plavix 75 mg. -Repeat CTA head and neck in 2-3 months and then decide on further antiplatelet regimen. -Follow-up with neurology as an outpatient.  Hyperlipidemia -Lipitor 40 mg daily.  History of SVT -Patient has history of SVT, follows up with Dr.  Harl Bowie as an outpatient.  She had a 24-hour Holter monitoring in the past.  -Continue to follow-up with cardiology as an outpatient.   History of migraine -On Topamax 200 mg daily.   Recent bimalleolar ankle fracture -Underwent surgical repair on 1/31. -Currently has limited weightbearing right lower extremity -Home meds include Robaxin 5 mg 3 times daily as needed -PT eval obtained.  Outpatient PT recommended -Outpatient orthopedic surgery follow-up   Right wrist fracture -Outpatient orthopedic surgery follow-up   GERD without esophagitis -Continue PPI     GAD (generalized anxiety disorder) -Continue Lexapro 10 mg daily,   Mobility: PT eval obtained.  Outpatient PT recommended Goals of care   Code Status: Prior   Nutritional status:  Body mass index is 21.13 kg/m.       Wounds: Incision (Closed) 11/28/21 Ankle Right (Active)  Date First Assessed/Time First Assessed: 11/28/21 1558   Location: Ankle  Location Orientation: Right    Assessments 11/28/2021  6:10 PM 11/28/2021  7:10 PM  Dressing Type Compression wrap Compression wrap  Dressing Clean;Dry;Intact Clean;Dry;Intact  Drainage Amount None None     No Linked orders to display    Discharge Exam:   Vitals:   12/08/21 0900 12/08/21 1000 12/08/21 1300 12/08/21 1623  BP: 102/61 100/64 121/65 120/68  Pulse: 70 66 66 70  Resp: 20 17 18 16   Temp:    97.8 F (36.6 C)  TempSrc:    Oral  SpO2: 100% 99% 100% 98%  Weight:      Height:        Body mass index is 21.13 kg/m.  General exam: Pleasant, middle-aged Caucasian female. Skin: No rashes, lesions or ulcers. HEENT: Atraumatic, normocephalic, no obvious bleeding Lungs: Clear to auscultation bilaterally CVS: Regular rate and rhythm, no murmur GI/Abd soft, nontender, nondistended, some present CNS: Alert, awake, oriented x3.  Left leg weakness improving Psychiatry: Mood appropriate Extremities: No pedal edema, no calf tenderness.  Right leg in cast from recent  surgery.  Right wrist with the splint on.  Follow ups:    Follow-up Information     Saguier, Percell Miller, PA-C Follow up.   Specialties: Internal Medicine, Family Medicine Contact information: [address] [place] [address] [phone]         Melvenia Beam, MD. Schedule an appointment as soon as possible for a visit in 1 month(s).   Specialty: Neurology Contact information: [address] [place] [address] [phone]                 Discharge Instructions:   Discharge Instructions     Ambulatory referral to Neurology   Complete by: As directed    Follow up with Dr. Jaynee Eagles at [facility] in 4-6 weeks. Pt is Dr. Cathren Laine pt. Thanks.   Call MD for:  difficulty breathing, headache or visual disturbances   Complete by: As directed    Call MD for:  extreme fatigue   Complete by: As directed    Call MD for:  hives   Complete by: As directed    Call MD for:  persistant dizziness or light-headedness   Complete by: As directed  Call MD for:  persistant nausea and vomiting   Complete by: As directed    Call MD for:  severe uncontrolled pain   Complete by: As directed    Call MD for:  temperature >100.4   Complete by: As directed    Diet - low sodium heart healthy   Complete by: As directed    Discharge instructions   Complete by: As directed    General discharge instructions: Follow with Primary MD Saguier, Percell Miller, PA-C in 7 days  Please request your PCP  to go over your hospital tests, procedures, radiology results at the follow up. Please get your medicines reviewed and adjusted.  Your PCP may decide to repeat certain labs or tests as needed. Do not drive, operate heavy machinery, perform activities at heights, swimming or participation in water activities or provide baby sitting services if your were admitted for syncope or siezures until you have seen by Primary MD or a Neurologist and advised to do so again. [place] Controlled  Substance Reporting System database was reviewed. Do not drive, operate heavy machinery, perform activities at heights, swim, participate in water activities or provide baby-sitting services while on medications for pain, sleep and mood until your outpatient physician has reevaluated you and advised to do so again.  You are strongly recommended to comply with the dose, frequency and duration of prescribed medications. Activity: As tolerated with Full fall precautions use walker/cane & assistance as needed Avoid using any recreational substances like cigarette, tobacco, alcohol, or non-prescribed drug. If you experience worsening of your admission symptoms, develop shortness of breath, life threatening emergency, suicidal or homicidal thoughts you must seek medical attention immediately by calling 911 or calling your MD immediately  if symptoms less severe. You must read complete instructions/literature along with all the possible adverse reactions/side effects for all the medicines you take and that have been prescribed to you. Take any new medicine only after you have completely understood and accepted all the possible adverse reactions/side effects.  Wear Seat belts while driving. You were cared for by a hospitalist during your hospital stay. If you have any questions about your discharge medications or the care you received while you were in the hospital after you are discharged, you can call the unit and ask to speak with the hospitalist or the covering physician. Once you are discharged, your primary care physician will handle any further medical issues. Please note that NO REFILLS for any discharge medications will be authorized once you are discharged, as it is imperative that you return to your primary care physician (or establish a relationship with a primary care physician if you do not have one).   Increase activity slowly   Complete by: As directed        Discharge Medications:   Allergies  as of 12/08/2021       Reactions   Sulfa Antibiotics Anaphylaxis   Sumatriptan Succinate Anaphylaxis   Emgality [galcanezumab-gnlm] Nausea Only   GI upset - diarrhea, nausea        Medication List     STOP taking these medications    ibuprofen 200 MG tablet Commonly known as: ADVIL       TAKE these medications    acetaminophen 500 MG tablet Commonly known as: TYLENOL Take 500 mg by mouth every 6 (six) hours as needed for mild pain, fever or headache.   Aspirin Low Dose 325 MG tablet Generic drug: aspirin 325 mg by mouth daily.   Atogepant 60  MG Tabs Take 60 mg by mouth daily.   atorvastatin 40 MG tablet Commonly known as: LIPITOR Take 1 tablet (40 mg total) by mouth daily.   Botox 200 units Solr Generic drug: Botulinum Toxin Type A INJECT 200 UNITS AS DIRECTED EVERY 3 (THREE) MONTHS.   Botox 200 units Solr Generic drug: Botulinum Toxin Type A Inject 200 Units as directed every 3 (three) months.   Calcium Carbonate-Vitamin D 600-400 MG-UNIT tablet Take 1 tablet by mouth 2 (two) times daily.   clopidogrel 75 MG tablet Commonly known as: PLAVIX Take 1 tablet (75 mg total) by mouth daily.   CO Q 10 PO Take 1 tablet by mouth daily.   escitalopram 10 MG tablet Commonly known as: LEXAPRO TAKE 1 TABLET BY MOUTH ONCE DAILY What changed: how much to take   methocarbamol 500 MG tablet Commonly known as: Robaxin Take 1 tablet (500 mg total) by mouth every 8 (eight) hours as needed. What changed: reasons to take this   nadolol 40 MG tablet Commonly known as: CORGARD Take 1 tablet (40 mg total) by mouth daily.   oxyCODONE-acetaminophen 5-325 MG tablet Commonly known as: Percocet Take 1 tablet by mouth every 4 (four) hours as needed for severe pain.   pantoprazole 40 MG tablet Commonly known as: PROTONIX TAKE 1 TABLET BY MOUTH ONCE DAILY What changed: how much to take   polycarbophil 625 MG tablet Commonly known as: FIBERCON Take 625 mg by mouth  daily.   Riboflavin 400 MG Caps Take 400 mg by mouth daily.   traZODone 50 MG tablet Commonly known as: DESYREL TAKE 1/2 - 1 TABLET BY MOUTH EVERY NIGHT AT BEDTIME AS NEEDED FOR SLEEP   Trokendi XR 200 MG Cp24 Generic drug: Topiramate ER Take 1 capsule (200 mg) by mouth daily.         The results of significant diagnostics from this hospitalization (including imaging, microbiology, ancillary and laboratory) are listed below for reference.    Procedures and Diagnostic Studies:   CT ANGIO HEAD NECK W WO CM  Result Date: 12/07/2021 CLINICAL DATA:  Stroke/TIA, determine embolic source EXAM: CT ANGIOGRAPHY HEAD AND NECK TECHNIQUE: Multidetector CT imaging of the head and neck was performed using the standard protocol during bolus administration of intravenous contrast. Multiplanar CT image reconstructions and MIPs were obtained to evaluate the vascular anatomy. Carotid stenosis measurements (when applicable) are obtained utilizing NASCET criteria, using the distal internal carotid diameter as the denominator. RADIATION DOSE REDUCTION: This exam was performed according to the departmental dose-optimization program which includes automated exposure control, adjustment of the mA and/or kV according to patient size and/or use of iterative reconstruction technique. CONTRAST:  29mL OMNIPAQUE IOHEXOL 350 MG/ML SOLN COMPARISON:  12/06/2021 FINDINGS: CT HEAD Brain: There is no acute intracranial hemorrhage. Hypoattenuation with loss of gray-white differentiation is present in the right MCA territory involving parietal and temporal lobes and insula. There is also involvement of the body of the caudate and a portion of the lentiform nucleus. Ventricles and sulci are normal in size and configuration. No extra-axial collection. Vascular: Linear hyperdensity in the area of infarction probably reflects intraluminal thrombus. Skull: Calvarium is unremarkable. Sinuses/Orbits: No acute finding. Other: None. Review  of the MIP images confirms the above findings CTA NECK Aortic arch: Great vessel origins are patent. Right carotid system: Patent. There is irregularity and narrowing of the mildly tortuous cervical ICA beyond the origin. Thickening along the wall reflecting fibrofatty plaque or mural thrombus. There is some exophytic plaque  or adherent thrombus. Stenosis measures up to 70%. Left carotid system: Patent.  No stenosis. Vertebral arteries: Patent. Left vertebral is dominant. No stenosis. Skeleton: Left facet predominant cervical spine degenerative changes. Other neck: Unremarkable. Upper chest: Included upper lungs are clear. Review of the MIP images confirms the above findings CTA HEAD Anterior circulation: Intracranial internal carotid arteries are patent. Proximal right middle cerebral artery is patent. There is occlusion of a right M3 segment in the region of curvilinear hyperdensity on noncontrast CT. Left middle and both anterior cerebral arteries are patent. Posterior circulation: Intracranial vertebral arteries are patent. Basilar artery is patent. Major cerebellar artery origins are patent. Posterior cerebral arteries are patent. Venous sinuses: Patent as allowed by contrast bolus timing. Review of the MIP images confirms the above findings IMPRESSION: No acute intracranial hemorrhage. Evolving acute infarction of the right MCA territory involving parietal and temporal lobes, insula, and basal ganglia. Abnormal appearance of the mid right cervical ICA with circumferential fibrofatty plaque resulting in 70% stenosis. There is a focal exophytic component that may reflect unstable plaque or adherent thrombus. Alternatively, this may represent dissection with mural thrombus. Occlusion of a right M3 MCA branch. This corresponds to hyperdense vessel on noncontrast portion. Electronically Signed   By: Macy Mis M.D.   On: 12/07/2021 09:34   CT Head Wo Contrast  Result Date: 12/06/2021 CLINICAL DATA:  Head  trauma after a fall. No loss of consciousness. Now nausea and vomiting. EXAM: CT HEAD WITHOUT CONTRAST TECHNIQUE: Contiguous axial images were obtained from the base of the skull through the vertex without intravenous contrast. RADIATION DOSE REDUCTION: This exam was performed according to the departmental dose-optimization program which includes automated exposure control, adjustment of the mA and/or kV according to patient size and/or use of iterative reconstruction technique. COMPARISON:  CT 12/20/2020.  MRI 01/18/2021 FINDINGS: Brain: There is focal area of vague low attenuation in the right frontotemporal region new since prior study. This could represent an area of acute ischemia. MRI suggested for further evaluation. No mass effect or midline shift. No abnormal extra-axial fluid collections. Gray-white matter junctions are distinct. Basal cisterns are not effaced. No acute intracranial hemorrhage. Ventricles are not dilated. Vascular: No hyperdense vessel or unexpected calcification. Skull: Calvarium appears intact. Sinuses/Orbits: Paranasal sinuses and mastoid air cells are clear. Other: None. IMPRESSION: 1. Focal low-attenuation suggested in the right frontotemporal region which may be new since prior study. Consider MRI for further evaluation. 2. No acute intracranial hemorrhage or mass effect. Electronically Signed   By: Lucienne Capers M.D.   On: 12/06/2021 18:41   MR ANGIO HEAD WO CONTRAST  Result Date: 12/07/2021 CLINICAL DATA:  Provided history: Stroke, follow-up. EXAM: MRA HEAD WITHOUT CONTRAST TECHNIQUE: Angiographic images of the Circle of Willis were acquired using MRA technique without intravenous contrast. COMPARISON:  Brain MRI 12/07/2021. CT angiogram head/neck 12/07/2021. Concurrently performed MRA neck 12/07/2021. FINDINGS: Anterior circulation: The intracranial internal carotid arteries are patent. The M1 middle cerebral arteries are patent. Redemonstrated occlusion of a right MCA  vessel at the M3 segment level (series 5, images 160 through 158) (series 1066, image 14). Moderate stenosis within a mid M2 right MCA vessel (series 1066, image 17). No left M2 proximal branch occlusion or high-grade proximal stenosis is identified. The anterior cerebral arteries are patent. Moderate stenoses versus artifact within the proximal left A1 segment (series 1066, image 8). No intracranial aneurysm is identified. Posterior circulation: The intracranial vertebral arteries are patent. The basilar artery is patent. The posterior cerebral  arteries are patent. Posterior communicating arteries are diminutive or absent bilaterally. Atherosclerotic irregularity of both vessels without high-grade proximal stenosis. Anatomic variants: As described. IMPRESSION: Persistent occlusion of a right MCA vessel at the M3 segment level. Moderate stenosis within a mid M2 right MCA vessels. Moderate stenoses (versus artifact) within the proximal left A1 segment. Atherosclerotic irregularity of the bilateral posterior cerebral arteries, without high-grade proximal stenosis. Electronically Signed   By: Kellie Simmering D.O.   On: 12/07/2021 16:54   MR ANGIO NECK W WO CONTRAST  Result Date: 12/07/2021 CLINICAL DATA:  Stroke, follow-up. EXAM: MRA NECK WITHOUT AND WITH CONTRAST TECHNIQUE: Multiplanar and multiecho pulse sequences of the neck were obtained without and with intravenous contrast. Angiographic images of the neck were obtained using MRA technique without and with intravenous contrast. CONTRAST:  30mL GADAVIST GADOBUTROL 1 MMOL/ML IV SOLN COMPARISON:  Concurrently performed MRA head 12/07/2021. CT angiogram head/neck 12/07/2021. Brain MRI 12/07/2021. FINDINGS: Standard aortic branching. The visualized aortic arch is normal in caliber. No hemodynamically significant innominate or proximal subclavian artery stenosis. The right common and internal carotid arteries are patent within the neck. Similar to the prior CTA head/neck  performed earlier today, the proximal-to-mid cervical right ICA is markedly irregular and narrowed. There was extensive abnormal hypodensity along the periphery of the vessel at this site on the prior CTA, which may reflect mural thrombus or extensive soft plaque. Also noted on the prior CTA, there are small low-density foci projecting into the lumen at this level, which may reflect unstable plaque or adherent thrombus. There is narrowing of the cervical right ICA at this site of up to 70%. The left common and internal carotid arteries are patent within the neck without stenosis. The vertebral arteries are patent within the neck without stenosis. The left vertebral artery is slightly dominant. IMPRESSION: Similar to the CTA head/neck performed earlier today, there is an abnormal appearance of the proximal-to-mid cervical right ICA with extensive peripheral mural thrombus and/or soft plaque at this site. Additionally, there may be superimposed small foci of unstable plaque or adherent thrombus within the vessel lumen. Resultant narrowing of the cervical right ICA at this site of up to 70%. The left common carotid, left internal carotid and bilateral vertebral arteries are patent within the neck without appreciable stenosis. Electronically Signed   By: Kellie Simmering D.O.   On: 12/07/2021 17:11   MR BRAIN WO CONTRAST  Result Date: 12/07/2021 CLINICAL DATA:  Follow-up examination for acute stroke. EXAM: MRI HEAD WITHOUT CONTRAST TECHNIQUE: Multiplanar, multiecho pulse sequences of the brain and surrounding structures were obtained without intravenous contrast. COMPARISON:  Head CT from 12/06/2021. FINDINGS: Brain: Cerebral volume within normal limits. No significant cerebral white matter disease for age. Scattered restricted diffusion seen involving the right insula and overlying right frontoparietal region, consistent with acute right MCA distribution infarct. Mild hazy diffusion signal abnormality also noted  involving the right caudate and lentiform nuclei. No associated mass effect or significant hemorrhage. No other evidence for acute or subacute ischemia. Gray-white matter differentiation otherwise maintained. No other areas of chronic cortical infarction. No acute intracranial hemorrhage. Few punctate chronic micro hemorrhages noted involving the supratentorial brain, of doubtful significance. No mass lesion, midline shift or mass effect no hydrocephalus or extra-axial fluid collection. Pituitary gland suprasellar region normal. Midline structures intact. Vascular: Abnormal flow void within the petrous and cavernous right ICA, which could be related to slow flow and/or occlusion (series 10, image 9). Major intracranial vascular flow voids are otherwise maintained.  Skull and upper cervical spine: Craniocervical junction normal. Bone marrow signal intensity within normal limits. No scalp soft tissue abnormality. Sinuses/Orbits: Globes orbital soft tissues within normal limits. Mild scattered mucosal thickening noted within the ethmoidal air cells. Paranasal sinuses are otherwise clear. No mastoid effusion. Other: None. IMPRESSION: 1. Patchy acute ischemic nonhemorrhagic right MCA distribution infarct as above. No associated mass effect. 2. Abnormal flow void within the petrous and cavernous right ICA, which could be related to slow flow and/or occlusion. 3. Otherwise normal brain MRI for age. Electronically Signed   By: Jeannine Boga M.D.   On: 12/07/2021 02:39   VAS [place] TRANSCRANIAL DOPPLER W BUBBLES  Result Date: 12/07/2021  Transcranial Doppler with Bubble Patient Name:  TAYONA SARNOWSKI  Date of Exam:   12/07/2021 Medical Rec #: [id number]     Accession #:    [id number] Date of Birth: [birth year]     Patient Gender: F Patient Age:   56 years Exam Location:  [facility] Procedure:      VAS [place] TRANSCRANIAL [place] Referring Phys: Cornelius Moras XU  --------------------------------------------------------------------------------  Indications: Stroke. Comparison Study: no prior Performing Technologist: Archie Patten RVS  Examination Guidelines: A complete evaluation includes B-mode imaging, spectral Doppler, color Doppler, and power Doppler as needed of all accessible portions of each vessel. Bilateral testing is considered an integral part of a complete examination. Limited examinations for reoccurring indications may be performed as noted.  Summary: No HITS at rest or during Valsalva. Negative transcranial Doppler Bubble study with no evidence of right to left intracardiac communication.  A vascular evaluation was performed. The right middle cerebral artery was studied. An IV was inserted into the patient's right forearm. Verbal informed consent was obtained.  *See table(s) above for TCD measurements and observations.  Diagnosing physician: Rosalin Hawking MD Electronically signed by Rosalin Hawking MD on 12/07/2021 at 6:22:07 PM.    Final    ECHOCARDIOGRAM COMPLETE BUBBLE STUDY  Result Date: 12/07/2021    ECHOCARDIOGRAM REPORT   Patient Name:   PARADISE VENSEL Date of Exam: 12/07/2021 Medical Rec #:  [id number]    Height:       67.0 in Accession #:    [id number]   Weight:       134.9 lb Date of Birth:  12/27/1960    BSA:          83.711 m Patient Age:    22 years     BP:           116/67 mmHg Patient Gender: F            HR:           57 bpm. Exam Location:  Inpatient Procedure: 2D Echo, Cardiac Doppler, Color Doppler and Saline Contrast Bubble            Study Indications:    Stroke 434.91 / I63.9  History:        Patient has prior history of Echocardiogram examinations, most                 recent 02/01/2020. Arrythmias:Tachycardia; Risk                 Factors:Dyslipidemia and Sleep Apnea.  Sonographer:    Bernadene Person RDCS Referring Phys: [id number] Crowder  1. Left ventricular ejection fraction, by estimation, is 60 to 65%. The left ventricle has  normal function. The left ventricle has no regional wall motion abnormalities. Left ventricular diastolic parameters were  normal.  2. Right ventricular systolic function is normal. The right ventricular size is normal. There is normal pulmonary artery systolic pressure.  3. A small pericardial effusion is present. There is no evidence of cardiac tamponade.  4. The mitral valve is normal in structure. Trivial mitral valve regurgitation. No evidence of mitral stenosis.  5. The aortic valve is tricuspid. Aortic valve regurgitation is not visualized. No aortic stenosis is present.  6. The inferior vena cava is normal in size with greater than 50% respiratory variability, suggesting right atrial pressure of 3 mmHg.  7. Agitated saline contrast bubble study was negative, with no evidence of any interatrial shunt. Comparison(s): No significant change from prior study. Conclusion(s)/Recommendation(s): Normal biventricular function without evidence of hemodynamically significant valvular heart disease. FINDINGS  Left Ventricle: Left ventricular ejection fraction, by estimation, is 60 to 65%. The left ventricle has normal function. The left ventricle has no regional wall motion abnormalities. The left ventricular internal cavity size was normal in size. There is  no left ventricular hypertrophy. Left ventricular diastolic parameters were normal. Right Ventricle: The right ventricular size is normal. No increase in right ventricular wall thickness. Right ventricular systolic function is normal. There is normal pulmonary artery systolic pressure. The tricuspid regurgitant velocity is 2.30 m/s, and  with an assumed right atrial pressure of 3 mmHg, the estimated right ventricular systolic pressure is 14.7 mmHg. Left Atrium: Left atrial size was normal in size. Right Atrium: Right atrial size was normal in size. Pericardium: A small pericardial effusion is present. There is no evidence of cardiac tamponade. Mitral Valve: The  mitral valve is normal in structure. Trivial mitral valve regurgitation. No evidence of mitral valve stenosis. Tricuspid Valve: The tricuspid valve is normal in structure. Tricuspid valve regurgitation is trivial. No evidence of tricuspid stenosis. Aortic Valve: The aortic valve is tricuspid. Aortic valve regurgitation is not visualized. No aortic stenosis is present. Pulmonic Valve: The pulmonic valve was not well visualized. Pulmonic valve regurgitation is not visualized. No evidence of pulmonic stenosis. Aorta: The aortic root, ascending aorta, aortic arch and descending aorta are all structurally normal, with no evidence of dilitation or obstruction. Venous: The inferior vena cava is normal in size with greater than 50% respiratory variability, suggesting right atrial pressure of 3 mmHg. IAS/Shunts: No atrial level shunt detected by color flow Doppler. Agitated saline contrast was given intravenously to evaluate for intracardiac shunting. Agitated saline contrast bubble study was negative, with no evidence of any interatrial shunt.  LEFT VENTRICLE PLAX 2D LVIDd:         5.10 cm     Diastology LVIDs:         3.70 cm     LV e' medial:    8.11 cm/s LV PW:         0.90 cm     LV E/e' medial:  7.7 LV IVS:        0.80 cm     LV e' lateral:   9.65 cm/s LVOT diam:     2.00 cm     LV E/e' lateral: 6.5 LV SV:         74 LV SV Index:   44 LVOT Area:     3.14 cm  LV Volumes (MOD) LV vol d, MOD A2C: 67.4 ml LV vol d, MOD A4C: 75.2 ml LV vol s, MOD A2C: 27.6 ml LV vol s, MOD A4C: 25.0 ml LV SV MOD A2C:     39.8 ml LV SV MOD A4C:  75.2 ml LV SV MOD BP:      45.2 ml RIGHT VENTRICLE RV S prime:     16.00 cm/s TAPSE (M-mode): 1.9 cm LEFT ATRIUM             Index        RIGHT ATRIUM           Index LA diam:        3.30 cm 1.93 cm/m   RA Area:     14.90 cm LA Vol (A2C):   41.5 ml 24.26 ml/m  RA Volume:   32.10 ml  18.76 ml/m LA Vol (A4C):   50.5 ml 29.52 ml/m LA Biplane Vol: 46.6 ml 27.24 ml/m  AORTIC VALVE LVOT Vmax:    108.00 cm/s LVOT Vmean:  69.800 cm/s LVOT VTI:    0.237 m  AORTA Ao Root diam: 3.00 cm Ao Asc diam:  2.80 cm MITRAL VALVE               TRICUSPID VALVE MV Area (PHT): 2.54 cm    TR Peak grad:   21.2 mmHg MV Decel Time: 299 msec    TR Vmax:        230.00 cm/s MV E velocity: 62.70 cm/s MV A velocity: 44.20 cm/s  SHUNTS MV E/A ratio:  1.42        Systemic VTI:  0.24 m                            Systemic Diam: 2.00 cm Buford Dresser MD Electronically signed by Buford Dresser MD Signature Date/Time: 12/07/2021/12:49:48 PM    Final    DG Hip Unilat With Pelvis 2-3 Views Right  Result Date: 12/06/2021 CLINICAL DATA:  Fall with right-sided hip pain EXAM: DG HIP (WITH OR WITHOUT PELVIS) 2-3V RIGHT COMPARISON:  09/15/2020 FINDINGS: SI joints are non widened. Pubic symphysis and rami are intact. No fracture or malalignment. IMPRESSION: No acute osseous abnormality. Electronically Signed   By: Donavan Foil M.D.   On: 12/06/2021 18:51   VAS [place] LOWER EXTREMITY VENOUS (DVT)  Result Date: 12/07/2021  Lower Venous DVT Study Patient Name:  BEONCA GIBB  Date of Exam:   12/07/2021 Medical Rec #: [id number]     Accession #:    [id number] Date of Birth: 18-Oct-1961     Patient Gender: F Patient Age:   33 years Exam Location:  [facility] Procedure:      VAS [place] LOWER EXTREMITY VENOUS (DVT) Referring Phys: Cornelius Moras XU --------------------------------------------------------------------------------  Indications: Stroke.  Limitations: Rt leg calf. Comparison Study: no prior Performing Technologist: Archie Patten RVS  Examination Guidelines: A complete evaluation includes B-mode imaging, spectral Doppler, color Doppler, and power Doppler as needed of all accessible portions of each vessel. Bilateral testing is considered an integral part of a complete examination. Limited examinations for reoccurring indications may be performed as noted. The reflux portion of the exam is performed with the patient in reverse  Trendelenburg.  +---------+---------------+---------+-----------+----------+-------------------+  RIGHT     Compressibility Phasicity Spontaneity Properties Thrombus Aging       +---------+---------------+---------+-----------+----------+-------------------+  CFV       Full            Yes       Yes                                         +---------+---------------+---------+-----------+----------+-------------------+  SFJ       Full                                                                  +---------+---------------+---------+-----------+----------+-------------------+  FV Prox   Full                                                                  +---------+---------------+---------+-----------+----------+-------------------+  FV Mid    Full                                                                  +---------+---------------+---------+-----------+----------+-------------------+  FV Distal Full                                                                  +---------+---------------+---------+-----------+----------+-------------------+  PFV       Full                                                                  +---------+---------------+---------+-----------+----------+-------------------+  POP       Full            Yes       Yes                                         +---------+---------------+---------+-----------+----------+-------------------+  PTV                                                        Not well visualized  +---------+---------------+---------+-----------+----------+-------------------+  PERO                                                       Not well visualized  +---------+---------------+---------+-----------+----------+-------------------+   +---------+---------------+---------+-----------+----------+--------------+  LEFT      Compressibility Phasicity Spontaneity Properties Thrombus Aging  +---------+---------------+---------+-----------+----------+--------------+   CFV       Full            Yes  Yes                                    +---------+---------------+---------+-----------+----------+--------------+  SFJ       Full                                                             +---------+---------------+---------+-----------+----------+--------------+  FV Prox   Full                                                             +---------+---------------+---------+-----------+----------+--------------+  FV Mid    Full                                                             +---------+---------------+---------+-----------+----------+--------------+  FV Distal Full                                                             +---------+---------------+---------+-----------+----------+--------------+  PFV       Full                                                             +---------+---------------+---------+-----------+----------+--------------+  POP       Full            Yes       Yes                                    +---------+---------------+---------+-----------+----------+--------------+  PTV       Full                                                             +---------+---------------+---------+-----------+----------+--------------+  PERO      Full                                                             +---------+---------------+---------+-----------+----------+--------------+     Summary: RIGHT: - There is no evidence of deep vein thrombosis in the lower extremity. However,  portions of this examination were limited- see technologist comments above.  - No cystic structure found in the popliteal fossa.  LEFT: - There is no evidence of deep vein thrombosis in the lower extremity.  - No cystic structure found in the popliteal fossa.  *See table(s) above for measurements and observations. Electronically signed by Monica Martinez MD on 12/07/2021 at 6:38:17 PM.    Final      Labs:   Basic Metabolic Panel: Recent Labs  Lab 12/06/21 1938  12/07/21 1735 12/08/21 0108  NA 141 138 138  K 4.0 3.3* 3.3*  CL 108 110 110  CO2 23 19* 21*  GLUCOSE 104* 117* 96  BUN 19 14 14   CREATININE 0.69 0.71 0.85  CALCIUM 8.9 8.4* 8.3*  MG  --   --  2.3   GFR Estimated Creatinine Clearance: 68 mL/min (by C-G formula based on SCr of 0.85 mg/dL). Liver Function Tests: Recent Labs  Lab 12/07/21 1735 12/08/21 0108  AST 19 20  ALT 14 14  ALKPHOS 53 62  BILITOT 1.0 0.7  PROT 5.6* 5.3*  ALBUMIN 3.3* 3.0*   No results for input(s): LIPASE, AMYLASE in the last 168 hours. No results for input(s): AMMONIA in the last 168 hours. Coagulation profile No results for input(s): INR, PROTIME in the last 168 hours.  CBC: Recent Labs  Lab 12/06/21 1938 12/08/21 0108  WBC 11.6* 10.4  NEUTROABS 10.1* 7.4  HGB 13.1 11.7*  HCT 38.7 34.5*  MCV 86.6 88.0  PLT 256 231   Cardiac Enzymes: No results for input(s): CKTOTAL, CKMB, CKMBINDEX, TROPONINI in the last 168 hours. BNP: Invalid input(s): POCBNP CBG: No results for input(s): GLUCAP in the last 168 hours. D-Dimer No results for input(s): DDIMER in the last 72 hours. Hgb A1c No results for input(s): HGBA1C in the last 72 hours. Lipid Profile Recent Labs    12/07/21 1735  CHOL 171  HDL 30*  LDLCALC 119*  TRIG 110  CHOLHDL 5.7   Thyroid function studies No results for input(s): TSH, T4TOTAL, T3FREE, THYROIDAB in the last 72 hours.  Invalid input(s): FREET3 Anemia work up No results for input(s): VITAMINB12, FOLATE, FERRITIN, TIBC, IRON, RETICCTPCT in the last 72 hours. Microbiology Recent Results (from the past 240 hour(s))  Resp Panel by RT-PCR (Flu A&B, Covid) Nasopharyngeal Swab     Status: None   Collection Time: 12/07/21  9:41 AM   Specimen: Nasopharyngeal Swab; Nasopharyngeal(NP) swabs in vial transport medium  Result Value Ref Range Status   SARS Coronavirus 2 by RT PCR NEGATIVE NEGATIVE Final    Comment: (NOTE) SARS-CoV-2 target nucleic acids are NOT  DETECTED.  The SARS-CoV-2 RNA is generally detectable in upper respiratory specimens during the acute phase of infection. The lowest concentration of SARS-CoV-2 viral copies this assay can detect is 138 copies/mL. A negative result does not preclude SARS-Cov-2 infection and should not be used as the sole basis for treatment or other patient management decisions. A negative result may occur with  improper specimen collection/handling, submission of specimen other than nasopharyngeal swab, presence of viral mutation(s) within the areas targeted by this assay, and inadequate number of viral copies(<138 copies/mL). A negative result must be combined with clinical observations, patient history, and epidemiological information. The expected result is Negative.  Fact Sheet for Patients:  EntrepreneurPulse.com.au  Fact Sheet for Healthcare Providers:  IncredibleEmployment.be  This test is no t yet approved or cleared by the [place] FDA and  has been authorized for detection and/or  diagnosis of SARS-CoV-2 by FDA under an Emergency Use Authorization (EUA). This EUA will remain  in effect (meaning this test can be used) for the duration of the COVID-19 declaration under Section 564(b)(1) of the Act, 21 U.S.C.section 360bbb-3(b)(1), unless the authorization is terminated  or revoked sooner.       Influenza A by PCR NEGATIVE NEGATIVE Final   Influenza B by PCR NEGATIVE NEGATIVE Final    Comment: (NOTE) The Xpert Xpress SARS-CoV-2/FLU/RSV plus assay is intended as an aid in the diagnosis of influenza from Nasopharyngeal swab specimens and should not be used as a sole basis for treatment. Nasal washings and aspirates are unacceptable for Xpert Xpress SARS-CoV-2/FLU/RSV testing.  Fact Sheet for Patients: EntrepreneurPulse.com.au  Fact Sheet for Healthcare Providers: IncredibleEmployment.be  This test is not yet  approved or cleared by the [place] FDA and has been authorized for detection and/or diagnosis of SARS-CoV-2 by FDA under an Emergency Use Authorization (EUA). This EUA will remain in effect (meaning this test can be used) for the duration of the COVID-19 declaration under Section 564(b)(1) of the Act, 21 U.S.C. section 360bbb-3(b)(1), unless the authorization is terminated or revoked.  Performed at [facility], [place] [address], [place], [place] [address]     Time coordinating discharge: 35 minutes  Signed: Zamira Hickam  [facility] 12/09/2021, 4:20 PM

## 2021-12-08 NOTE — Progress Notes (Signed)
STROKE TEAM PROGRESS NOTE   INTERVAL HISTORY Patient is seen in the emergency department with no family at the bedside.  Pt overnight no acute event. Neuro stable. CTA yesterday showed right ICA 70% stenosis with circumferential fibrofatty plaque and possible adherent thrombus. Discussed with Dr. Norma Fredrickson, recommended MRA neck which showed mid cervical right ICA with extensive peripheral mural thrombus, more likely hematoma from dissection.   Pt stated that 2 weeks ago she had bad fall, tripled over on the right leg, with right ankle fracture and wrist fracture with extensive pain at right should, chest and right neck. The day before this admission, she was washing her face and with significant pain at right neck and she had pulsatile tinnitus in the right ear the night before current admission. This is consistent with right ICA dissection.   Vitals:   12/08/21 0754 12/08/21 0900 12/08/21 1000 12/08/21 1300  BP:  102/61 100/64 121/65  Pulse: 66 70 66 66  Resp: 18 20 17 18   Temp: 97.7 F (36.5 C)     TempSrc: Oral     SpO2: 100% 100% 99% 100%  Weight:      Height:       CBC:  Recent Labs  Lab 12/06/21 1938 12/08/21 0108  WBC 11.6* 10.4  NEUTROABS 10.1* 7.4  HGB 13.1 11.7*  HCT 38.7 34.5*  MCV 86.6 88.0  PLT 256 268   Basic Metabolic Panel:  Recent Labs  Lab 12/07/21 1735 12/08/21 0108  NA 138 138  K 3.3* 3.3*  CL 110 110  CO2 19* 21*  GLUCOSE 117* 96  BUN 14 14  CREATININE 0.71 0.85  CALCIUM 8.4* 8.3*  MG  --  2.3   Lipid Panel:  Recent Labs  Lab 12/07/21 1735  CHOL 171  TRIG 110  HDL 30*  CHOLHDL 5.7  VLDL 22  LDLCALC 119*   HgbA1c: No results for input(s): HGBA1C in the last 168 hours. Urine Drug Screen: No results for input(s): LABOPIA, COCAINSCRNUR, LABBENZ, AMPHETMU, THCU, LABBARB in the last 168 hours.  Alcohol Level No results for input(s): ETH in the last 168 hours.  IMAGING past 24 hours MR ANGIO HEAD WO CONTRAST  Result Date:  12/07/2021 CLINICAL DATA:  Provided history: Stroke, follow-up. EXAM: MRA HEAD WITHOUT CONTRAST TECHNIQUE: Angiographic images of the Circle of Willis were acquired using MRA technique without intravenous contrast. COMPARISON:  Brain MRI 12/07/2021. CT angiogram head/neck 12/07/2021. Concurrently performed MRA neck 12/07/2021. FINDINGS: Anterior circulation: The intracranial internal carotid arteries are patent. The M1 middle cerebral arteries are patent. Redemonstrated occlusion of a right MCA vessel at the M3 segment level (series 5, images 160 through 158) (series 1066, image 14). Moderate stenosis within a mid M2 right MCA vessel (series 1066, image 17). No left M2 proximal branch occlusion or high-grade proximal stenosis is identified. The anterior cerebral arteries are patent. Moderate stenoses versus artifact within the proximal left A1 segment (series 1066, image 8). No intracranial aneurysm is identified. Posterior circulation: The intracranial vertebral arteries are patent. The basilar artery is patent. The posterior cerebral arteries are patent. Posterior communicating arteries are diminutive or absent bilaterally. Atherosclerotic irregularity of both vessels without high-grade proximal stenosis. Anatomic variants: As described. IMPRESSION: Persistent occlusion of a right MCA vessel at the M3 segment level. Moderate stenosis within a mid M2 right MCA vessels. Moderate stenoses (versus artifact) within the proximal left A1 segment. Atherosclerotic irregularity of the bilateral posterior cerebral arteries, without high-grade proximal stenosis. Electronically Signed   By:  Kellie Simmering D.O.   On: 12/07/2021 16:54   MR ANGIO NECK W WO CONTRAST  Result Date: 12/07/2021 CLINICAL DATA:  Stroke, follow-up. EXAM: MRA NECK WITHOUT AND WITH CONTRAST TECHNIQUE: Multiplanar and multiecho pulse sequences of the neck were obtained without and with intravenous contrast. Angiographic images of the neck were obtained  using MRA technique without and with intravenous contrast. CONTRAST:  109mL GADAVIST GADOBUTROL 1 MMOL/ML IV SOLN COMPARISON:  Concurrently performed MRA head 12/07/2021. CT angiogram head/neck 12/07/2021. Brain MRI 12/07/2021. FINDINGS: Standard aortic branching. The visualized aortic arch is normal in caliber. No hemodynamically significant innominate or proximal subclavian artery stenosis. The right common and internal carotid arteries are patent within the neck. Similar to the prior CTA head/neck performed earlier today, the proximal-to-mid cervical right ICA is markedly irregular and narrowed. There was extensive abnormal hypodensity along the periphery of the vessel at this site on the prior CTA, which may reflect mural thrombus or extensive soft plaque. Also noted on the prior CTA, there are small low-density foci projecting into the lumen at this level, which may reflect unstable plaque or adherent thrombus. There is narrowing of the cervical right ICA at this site of up to 70%. The left common and internal carotid arteries are patent within the neck without stenosis. The vertebral arteries are patent within the neck without stenosis. The left vertebral artery is slightly dominant. IMPRESSION: Similar to the CTA head/neck performed earlier today, there is an abnormal appearance of the proximal-to-mid cervical right ICA with extensive peripheral mural thrombus and/or soft plaque at this site. Additionally, there may be superimposed small foci of unstable plaque or adherent thrombus within the vessel lumen. Resultant narrowing of the cervical right ICA at this site of up to 70%. The left common carotid, left internal carotid and bilateral vertebral arteries are patent within the neck without appreciable stenosis. Electronically Signed   By: Kellie Simmering D.O.   On: 12/07/2021 17:11   VAS [place] TRANSCRANIAL DOPPLER W BUBBLES  Result Date: 12/07/2021  Transcranial Doppler with Bubble Patient Name:  Alice Williams   Date of Exam:   12/07/2021 Medical Rec #: [id number]     Accession #:    [id number] Date of Birth: 1961-08-06     Patient Gender: F Patient Age:   61 years Exam Location:  [facility] Procedure:      VAS [place] TRANSCRANIAL [place] Referring Phys: Cornelius Moras Kirandeep Fariss --------------------------------------------------------------------------------  Indications: Stroke. Comparison Study: no prior Performing Technologist: Archie Patten RVS  Examination Guidelines: A complete evaluation includes B-mode imaging, spectral Doppler, color Doppler, and power Doppler as needed of all accessible portions of each vessel. Bilateral testing is considered an integral part of a complete examination. Limited examinations for reoccurring indications may be performed as noted.  Summary: No HITS at rest or during Valsalva. Negative transcranial Doppler Bubble study with no evidence of right to left intracardiac communication.  A vascular evaluation was performed. The right middle cerebral artery was studied. An IV was inserted into the patient's right forearm. Verbal informed consent was obtained.  *See table(s) above for TCD measurements and observations.  Diagnosing physician: Rosalin Hawking MD Electronically signed by Rosalin Hawking MD on 12/07/2021 at 6:22:07 PM.    Final     PHYSICAL EXAM General:  Alert, well-developed, well-nourished patient in no acute distress.  Patient has right wrist brace and cast on right leg.   NEURO:  Mental Status: AA&Ox3  Speech/Language: speech is without dysarthria or aphasia.  Cranial Nerves:  II: PERRL. Visual fields full.  III, IV, VI: EOMI. Eyelids elevate symmetrically.  V: Sensation is intact to light touch VII: Smile is symmetrical. Able to puff cheeks and raise eyebrows.  VIII: hearing intact to voice. IX, X: Phonation is normal.  XII: tongue is midline without fasciculations. Motor: 5/5 strength to all muscle groups tested.  Tone: is normal and bulk is normal Sensation- Intact  to light touch bilaterally. Coordination: FTN intact bilaterally.  No drift.  Gait- deferred   ASSESSMENT/PLAN Alice Williams is a 61 y.o. female with history of HLD, GERD, migraines and PTSD presenting with left lower extremity weakness and left sided neglect.  Yesterday, she fell from her knee scooter (patient has had a recent fall with ORIF of right ankle) after experiencing left leg weakness.  Later that day, she thought that her left hand was an object placed in her lap, although she still had sensation in her hand at the time.  MRI reveals right ischemic MCA infarct.  Stroke:  right MCA infarct, likely due to right ICA dissection with right ICA stenosis and vessel wall hematoma, secondary to fall 2 weeks ago CT head Focal low-attenuation in right frontotemporal region, no acute hemorrhage or mass effect CTA head & neck Evolving infarct of right MCA territory including parietal and temporal lobes, insula and basal ganglia, occlusion of right M3 MCA branch, There is irregularity and narrowing of the mildly tortuous cervical ICA beyond the origin. Thickening along the wall reflecting fibrofatty plaque or mural thrombus. There is some exophytic plaque or adherent thrombus. Stenosis measures up to 70%. MRI  patchy acute ischemic right MCA distribution infarct, abnormal flow in petrous and cavernous right ICA MRA head and neck - abnormal appearance of the proximal-to-mid cervical right ICA with extensive peripheral mural thrombus and/or soft plaque at this site. Additionally, there may be superimposed small foci of unstable plaque or adherent thrombus within the vessel lumen. Resultant narrowing of the cervical right ICA at this site of up to 70%. 2D Echo EF 60-65%, small pericardial effusion, no evidence of interatrial shunt Lower extremity doppler (excluding right calf) No evidence of DVT TCD bubble study no PFO LDL 119 HgbA1c pending VTE prophylaxis - lovenox aspirin 81 mg daily prior to  admission, now on aspirin 325 mg daily and clopidogrel 75 mg daily. Continue on discharge. Repeat CTA in 2-3 month and decide further antiplatelet regimen.   Therapy recommendations:  outpt PT Disposition:  pending  Right ICA dissection  2 weeks ago pt had bad fall, tripled  over on the right leg, with right ankle fracture and wrist fracture with extensive pain at right should, chest and right neck. The day before this admission, she was washing her face and with significant pain at right neck and she had pulsatile tinnitus in the right ear the night before current admission. This is consistent with right ICA dissection. CTA head & neck Evolving infarct of right MCA territory including parietal and temporal lobes, insula and basal ganglia, occlusion of right M3 MCA branch, There is irregularity and narrowing of the mildly tortuous cervical ICA beyond the origin. Thickening along the wall reflecting fibrofatty plaque or mural thrombus. There is some exophytic plaque or adherent thrombus. Stenosis measures up to 70%. MRA head and neck - abnormal appearance of the proximal-to-mid cervical right ICA with extensive peripheral mural thrombus and/or soft plaque at this site. Additionally, there may be superimposed small foci of unstable plaque or adherent thrombus within the  vessel lumen. Resultant narrowing of the cervical right ICA at this site of up to 70%. Continue DAPT with ASA 325 and plavix 75 DAPT. Repeat CTA head and neck in 2-3 months and then decide on further antiplatelet regimen  BP management Home meds:  none Stable Avoid low BP Long-term BP goal normotensive  Hyperlipidemia Home meds:  none LDL 156 10/2020 LDL 119, goal < 70 on Atorvastatin 40 mg daily  Continue statin at discharge  Hx of SVT Patient had a history of SVT  Had a 24-hour Holter monitoring in the past.   Patient still complaining of intermittent heart palpitation. Follow up with Dr. Boris Lown as outpt  Other Stroke  Risk Factors Former cigarette smoker Migraines  Other Active Problems Right wrist and ankle fracture, s/p ORIF of right ankle Follow up with orthopedic surgeon as outpatient  Hospital day # 0  Neurology will sign off. Please call with questions. Pt will follow up with Dr. Jaynee Eagles at [facility] in about 4 weeks. Thanks for the consult.   Rosalin Hawking, MD PhD Stroke Neurology 12/08/2021 1:34 PM   To contact Stroke Continuity provider, please refer to [email or web address] After hours, contact General Neurology

## 2021-12-08 NOTE — Telephone Encounter (Signed)
Patient called advised she is being discharged from the hospital now. Patient said Dr. Marlou Sa wanted to see the incision site before she left the hospital. The number to contact patient is [phone]

## 2021-12-08 NOTE — Telephone Encounter (Signed)
IC patient advised Dr Marlou Sa out of office but I have messaged him to let him know.

## 2021-12-08 NOTE — Evaluation (Signed)
Speech Language Pathology Evaluation Patient Details Name: Alice Williams MRN: [id number] DOB: 14-Nov-1960 Today's Date: 12/08/2021 Time: 5400-8676 SLP Time Calculation (min) (ACUTE ONLY): 21 min  Problem List:  Patient Active Problem List   Diagnosis Date Noted   Acute right MCA stroke ([place]) 12/07/2021   GERD without esophagitis 12/07/2021   Right wrist fracture, with routine healing, subsequent encounter 12/07/2021   Bimalleolar ankle fracture, right, closed, with routine healing, subsequent encounter    Blepharospasm 11/08/2021   Sleep disorder, circadian, shift work type 08/01/2021   Excessive daytime sleepiness 08/01/2021   Morning headache 02/16/2021   Left eye pain 02/16/2021   Intractable episodic cluster headache 02/16/2021   Behavioral insomnia of childhood 02/16/2021   Retrognathia 02/16/2021   Chronic migraine without aura, with intractable migraine, so stated, with status migrainosus 01/12/2021   Muscle spasm 10/14/2020   Vitamin D deficiency 08/15/2020   Acute bilateral low back pain with bilateral sciatica 07/13/2020   Gastric polyp 02/11/2019   Sessile colonic polyp 02/11/2019   Chronic fatigue 02/11/2019   Grade I internal hemorrhoids 02/11/2019   Ductal hyperplasia of breast 08/21/2018   Cognitive changes 06/24/2018   Dyslipidemia 06/24/2018   Dense breast tissue on mammogram 04/17/2018   Fibrocystic breast determined by biopsy 04/09/2018   Family history of breast cancer in mother 04/09/2018   Paroxysmal sinus tachycardia ([place]) 03/27/2018   Osteoporosis 08/28/2017   Facet arthropathy, lumbosacral 08/28/2017   Chronic left SI joint pain 08/27/2017   Left lumbar radiculopathy 08/27/2017   Allergic rhinitis 08/09/2017   Disorder of lipoid metabolism 08/09/2017   Insomnia secondary to anxiety 08/09/2017   Postmenopausal osteoporosis 08/09/2017   History of hysterectomy for benign disease 08/09/2017   GAD (generalized anxiety disorder) 08/09/2017   Former  smoker, stopped smoking in distant past 08/09/2017   Minor depressive disorder 08/09/2017   Palpitations 03/16/2015   Family history of colon cancer 03/02/2015   Hiatal hernia with GERD without esophagitis 03/02/2015   History of adenomatous polyp of colon 03/02/2015   Chronic migraine without aura 10/10/2014   History of cardiovascular disorder 01/18/2003   Past Medical History:  Past Medical History:  Diagnosis Date   Anal fissure    Anemia    in early 20's   Anxiety    Arthritis    back   Breast calcifications on mammogram    Cancer ([place])    pre cancer colon,cervix   Colon polyp    Dyslipidemia 06/24/2018   Elevated cholesterol    Facet arthropathy, lumbosacral 08/28/2017   Family history of adverse reaction to anesthesia    sibling has N/V   GERD (gastroesophageal reflux disease)    Heart murmur    History of colon polyps    History of kidney stones    has small ones   IBS (irritable bowel syndrome)    Inappropriate sinus tachycardia    Migraines    Osteopenia    Osteoporosis of femur without pathological fracture 08/28/2017   Palpitations    PONV (postoperative nausea and vomiting)    Premature surgical menopause    PTSD (post-traumatic stress disorder)    Sleep apnea    Past Surgical History:  Past Surgical History:  Procedure Laterality Date   ANAL FISSURE REPAIR     APPENDECTOMY  2000   COLONOSCOPY W/ POLYPECTOMY  03/2015   COLONOSCOPY WITH ESOPHAGOGASTRODUODENOSCOPY (EGD)  04/01/2015   [place], 1998  ORIF ANKLE FRACTURE Right 11/28/2021   Procedure: OPEN REDUCTION INTERNAL FIXATION (ORIF) ANKLE FRACTURE;  Surgeon: Meredith Pel, MD;  Location: [place];  Service: Orthopedics;  Laterality: Right;   PELVIC LAPAROSCOPY     x 4 for endometriosis    VAGINAL HYSTERECTOMY  1994   HPI:  Pt is a 61 y/o female admitted 2/8 after fall. MRI 12/06/21 revealed R MCA CVA.  Noted recent fall with R wrist fracture  (in splint) and R ankle fractue s/p ORIF 1/31. PMH includes: migraine headaches, PTSD.   Assessment / Plan / Recommendation Clinical Impression  Pt presents with cognitive communication deficits post CVA, marked by impaired delayed recall and executive functions. [facility] Mental Status Examination (SLUMS) administered with the pt yielding the following score: 24/30, which is outside of normal range (27-30). She demonstrates strengths in the areas of orientation and visuospatial skills. While simple calculation task was performed accurately, she required increased processing time and repetitions to complete. Intellectual awareness and some emergent awareness noted throughout assessment. Speech and language was [facility]. SLP services recommended to f/u during acute stay for treatment of higher level cognitive functions.    SLP Assessment  SLP Recommendation/Assessment: Patient needs continued Speech [place] [facility] SLP Visit Diagnosis: Cognitive communication deficit (R41.841)    Recommendations for follow up therapy are one component of a multi-disciplinary discharge planning process, led by the attending physician.  Recommendations may be updated based on patient status, additional functional criteria and insurance authorization.    Follow Up Recommendations  Outpatient SLP    Assistance Recommended at Discharge  Frequent or constant Supervision/Assistance  Functional Status Assessment Patient has had a recent decline in their functional status and demonstrates the ability to make significant improvements in function in a reasonable and predictable amount of time.  Frequency and Duration min 2x/week  2 weeks      SLP Evaluation Cognition  Overall Cognitive Status: Impaired/Different from baseline Arousal/Alertness: Awake/alert Orientation Level: Oriented X4 Year: 2023 Month: February Day of Week: Correct Attention: Sustained Sustained Attention: Appears  intact Memory: Impaired Memory Impairment: Retrieval deficit;Decreased recall of new information Immediate Memory Recall:  (SLUMS 5/5) Awareness: Impaired Awareness Impairment: Emergent impairment Problem Solving: Impaired Problem Solving Impairment: Functional basic Executive Function: Sequencing;Organizing;Self Correcting;Self Monitoring Sequencing: Impaired Sequencing Impairment: Verbal basic Organizing: Impaired Organizing Impairment: Functional basic Self Monitoring: Impaired Self Monitoring Impairment: Functional basic Self Correcting: Impaired Self Correcting Impairment: Functional basic       Comprehension  Auditory Comprehension Overall Auditory Comprehension: Appears within functional limits for tasks assessed Visual Recognition/Discrimination Discrimination: Not tested Reading Comprehension Reading Status: Not tested    Expression Expression Primary Mode of Expression: Verbal Verbal Expression Overall Verbal Expression: Appears within functional limits for tasks assessed Initiation: No impairment Automatic Speech: Name;Social Response;Day of week;Month of year Written Expression Dominant Hand: Right Written Expression: Not tested   Oral / Motor  Oral Motor/Sensory Function Overall Oral Motor/Sensory Function: Within functional limits Motor Speech Overall Motor Speech: Appears within functional limits for tasks assessed             Ellwood Dense, [place], [place] Office Number: [phone]  Acie Fredrickson 12/08/2021, 11:55 AM

## 2021-12-10 ENCOUNTER — Telehealth: Payer: Self-pay | Admitting: Neurology

## 2021-12-10 NOTE — Telephone Encounter (Signed)
Patient had a stroke, she needs to have a follow up with me in the office in about 6 weeks. Can you call and get her scheduled for me please? Thank you!

## 2021-12-11 ENCOUNTER — Other Ambulatory Visit: Payer: Self-pay | Admitting: Neurology

## 2021-12-11 ENCOUNTER — Encounter: Payer: No Typology Code available for payment source | Admitting: Orthopedic Surgery

## 2021-12-11 DIAGNOSIS — I7770 Dissection of unspecified artery: Secondary | ICD-10-CM

## 2021-12-11 DIAGNOSIS — R531 Weakness: Secondary | ICD-10-CM

## 2021-12-11 DIAGNOSIS — I63411 Cerebral infarction due to embolism of right middle cerebral artery: Secondary | ICD-10-CM

## 2021-12-11 DIAGNOSIS — I639 Cerebral infarction, unspecified: Secondary | ICD-10-CM

## 2021-12-11 DIAGNOSIS — R414 Neurologic neglect syndrome: Secondary | ICD-10-CM

## 2021-12-11 NOTE — Telephone Encounter (Signed)
Still pending

## 2021-12-11 NOTE — Telephone Encounter (Signed)
I called and LVM for patient to call us back to schedule. This would be the last week of March.

## 2021-12-13 ENCOUNTER — Other Ambulatory Visit: Payer: Self-pay

## 2021-12-13 DIAGNOSIS — I63511 Cerebral infarction due to unspecified occlusion or stenosis of right middle cerebral artery: Secondary | ICD-10-CM

## 2021-12-13 DIAGNOSIS — I471 Supraventricular tachycardia: Secondary | ICD-10-CM

## 2021-12-13 DIAGNOSIS — Z8679 Personal history of other diseases of the circulatory system: Secondary | ICD-10-CM

## 2021-12-13 NOTE — Patient Outreach (Signed)
[facility]) Care Management  12/13/2021  Alice Williams 04/02/1961 [id number]   [place] Organization [ACO] Patient: [facility] Employee plan  Primary Care Provider:  Valda Favia at [facility] is an Teacher, early years/pre with a Chronic Care Management program  Patient recently transitioned home on 12/08/21 from [facility] ED with a assigned to a [place] Management for telephonic chronic disease management services.    Referral for [facility] plan for disease management and community resource support.    Call placed to patient regarding post hospital ED follow up needs. Patient confirmed HIPPA by 2 identifications.  She states the neurologist set her up with PT eval scheduled for tomorrow.  Explained [facility] CM role for employee support for follow up.  Patient states she wouldn't mind having Embedded  RN for telephonic follow up.     Plan: Patient will be referred for Embedded CCM by a Telephonic [facility] RN Care Coordinator.   For additional questions or referrals please contact:   Natividad Brood, RN BSN [facility] Liaison  [phone] business mobile phone Toll free office [phone]  Fax number: [phone] [email or web address]

## 2021-12-13 NOTE — Telephone Encounter (Signed)
I called her and she was doing reasonably well.  Need to look at the incisions and take the sutures out.

## 2021-12-14 ENCOUNTER — Emergency Department (HOSPITAL_COMMUNITY): Payer: No Typology Code available for payment source

## 2021-12-14 ENCOUNTER — Ambulatory Visit: Payer: No Typology Code available for payment source | Attending: Neurology | Admitting: Rehabilitation

## 2021-12-14 ENCOUNTER — Emergency Department (HOSPITAL_COMMUNITY)
Admission: EM | Admit: 2021-12-14 | Discharge: 2021-12-14 | Disposition: A | Discharge status: Home / Self Care | Payer: No Typology Code available for payment source | Attending: Emergency Medicine | Admitting: Emergency Medicine

## 2021-12-14 ENCOUNTER — Ambulatory Visit: Payer: No Typology Code available for payment source | Admitting: Occupational Therapy

## 2021-12-14 ENCOUNTER — Telehealth: Payer: Self-pay | Admitting: Rehabilitation

## 2021-12-14 ENCOUNTER — Encounter (HOSPITAL_COMMUNITY): Payer: Self-pay | Admitting: Emergency Medicine

## 2021-12-14 ENCOUNTER — Other Ambulatory Visit: Payer: Self-pay

## 2021-12-14 ENCOUNTER — Encounter: Payer: Self-pay | Admitting: Rehabilitation

## 2021-12-14 DIAGNOSIS — R296 Repeated falls: Secondary | ICD-10-CM | POA: Diagnosis present

## 2021-12-14 DIAGNOSIS — R531 Weakness: Secondary | ICD-10-CM | POA: Diagnosis not present

## 2021-12-14 DIAGNOSIS — H5500 Unspecified nystagmus: Secondary | ICD-10-CM | POA: Insufficient documentation

## 2021-12-14 DIAGNOSIS — R197 Diarrhea, unspecified: Secondary | ICD-10-CM | POA: Diagnosis not present

## 2021-12-14 DIAGNOSIS — I639 Cerebral infarction, unspecified: Secondary | ICD-10-CM | POA: Insufficient documentation

## 2021-12-14 DIAGNOSIS — I69354 Hemiplegia and hemiparesis following cerebral infarction affecting left non-dominant side: Secondary | ICD-10-CM | POA: Diagnosis present

## 2021-12-14 DIAGNOSIS — R55 Syncope and collapse: Secondary | ICD-10-CM | POA: Insufficient documentation

## 2021-12-14 DIAGNOSIS — R42 Dizziness and giddiness: Secondary | ICD-10-CM | POA: Insufficient documentation

## 2021-12-14 DIAGNOSIS — M25631 Stiffness of right wrist, not elsewhere classified: Secondary | ICD-10-CM | POA: Insufficient documentation

## 2021-12-14 DIAGNOSIS — R471 Dysarthria and anarthria: Secondary | ICD-10-CM | POA: Diagnosis present

## 2021-12-14 DIAGNOSIS — R2681 Unsteadiness on feet: Secondary | ICD-10-CM | POA: Diagnosis not present

## 2021-12-14 DIAGNOSIS — M6281 Muscle weakness (generalized): Secondary | ICD-10-CM | POA: Diagnosis present

## 2021-12-14 DIAGNOSIS — R414 Neurologic neglect syndrome: Secondary | ICD-10-CM | POA: Insufficient documentation

## 2021-12-14 DIAGNOSIS — R0789 Other chest pain: Secondary | ICD-10-CM | POA: Insufficient documentation

## 2021-12-14 DIAGNOSIS — I7770 Dissection of unspecified artery: Secondary | ICD-10-CM | POA: Insufficient documentation

## 2021-12-14 DIAGNOSIS — I63411 Cerebral infarction due to embolism of right middle cerebral artery: Secondary | ICD-10-CM | POA: Insufficient documentation

## 2021-12-14 LAB — BASIC METABOLIC PANEL
Anion gap: 11 (ref 5–15)
BUN: 16 mg/dL (ref 6–20)
CO2: 19 mmol/L — ABNORMAL LOW (ref 22–32)
Calcium: 8.9 mg/dL (ref 8.9–10.3)
Chloride: 105 mmol/L (ref 98–111)
Creatinine, Ser: 0.83 mg/dL (ref 0.44–1.00)
GFR, Estimated: >60 mL/min (ref >60)
Glucose, Bld: 146 mg/dL — ABNORMAL HIGH (ref 70–99)
Potassium: 3.5 mmol/L (ref 3.5–5.1)
Sodium: 135 mmol/L (ref 135–145)

## 2021-12-14 LAB — CBC WITH DIFFERENTIAL/PLATELET
Abs Immature Granulocytes: 0.06 10*3/uL (ref 0.00–0.07)
Basophils Absolute: 0.1 10*3/uL (ref 0.0–0.1)
Basophils Relative: 1 %
Eosinophils Absolute: 0.1 10*3/uL (ref 0.0–0.5)
Eosinophils Relative: 1 %
HCT: 39.4 % (ref 36.0–46.0)
Hemoglobin: 14.1 g/dL (ref 12.0–15.0)
Immature Granulocytes: 1 %
Lymphocytes Relative: 11 %
Lymphs Abs: 1.1 10*3/uL (ref 0.7–4.0)
MCH: 30.3 pg (ref 26.0–34.0)
MCHC: 35.8 g/dL (ref 30.0–36.0)
MCV: 84.7 fL (ref 80.0–100.0)
Monocytes Absolute: 0.6 10*3/uL (ref 0.1–1.0)
Monocytes Relative: 6 %
Neutro Abs: 8.6 10*3/uL — ABNORMAL HIGH (ref 1.7–7.7)
Neutrophils Relative %: 80 %
Platelets: 382 10*3/uL (ref 150–400)
RBC: 4.65 MIL/uL (ref 3.87–5.11)
RDW: 12.5 % (ref 11.5–15.5)
WBC: 10.5 10*3/uL (ref 4.0–10.5)
nRBC: 0 % (ref 0.0–0.2)

## 2021-12-14 LAB — TROPONIN I (HIGH SENSITIVITY): Troponin I (High Sensitivity): 8 ng/L (ref <18)

## 2021-12-14 MED ORDER — IOHEXOL 350 MG/ML SOLN
75.0000 mL | Freq: Once | INTRAVENOUS | Status: AC | PRN
  Administered 2021-12-14: 75 mL via INTRAVENOUS

## 2021-12-14 MED ORDER — SODIUM CHLORIDE 0.9 % IV BOLUS
1000.0000 mL | Freq: Once | INTRAVENOUS | Status: AC
  Administered 2021-12-14: 1000 mL via INTRAVENOUS

## 2021-12-14 NOTE — ED Triage Notes (Signed)
Patient here after near-syncope in physical therapy. Patient states she stood up and started to feel dizzy and vision became dim, patient reports initial BP during incident of 85/54. Patient did not fall, did not lose consciousness, and BP in triage is 103/69.

## 2021-12-14 NOTE — ED Notes (Signed)
Patient transported to CT 

## 2021-12-14 NOTE — Therapy (Signed)
[facility] [address] [place], [place], [address] Phone: [phone]   Fax:  [phone]  Physical Therapy Evaluation  Patient Details  Name: Alice Williams MRN: [id number] Date of Birth: 1960-11-06 Referring Provider (PT): Rosalin Hawking, MD; Sarina Ill, MD   Encounter Date: 12/14/2021   PT End of Session - 12/14/21 1334     Visit Number 1    Number of Visits 25    Date for PT Re-Evaluation 03/14/22    Authorization Type [facility] Needs auth after 12th visit    Authorization - Visit Number 1    Authorization - Number of Visits 12    PT Start Time 1017    PT Stop Time 1105    PT Time Calculation (min) 48 min    Activity Tolerance Patient limited by lethargy;Treatment limited secondary to medical complications (Comment)    Behavior During Therapy Flat affect             Past Medical History:  Diagnosis Date   Anal fissure    Anemia    in early 20's   Anxiety    Arthritis    back   Breast calcifications on mammogram    Cancer ([place])    pre cancer colon,cervix   Colon polyp    Dyslipidemia 06/24/2018   Elevated cholesterol    Facet arthropathy, lumbosacral 08/28/2017   Family history of adverse reaction to anesthesia    sibling has N/V   GERD (gastroesophageal reflux disease)    Heart murmur    History of colon polyps    History of kidney stones    has small ones   IBS (irritable bowel syndrome)    Inappropriate sinus tachycardia    Migraines    Osteopenia    Osteoporosis of femur without pathological fracture 08/28/2017   Palpitations    PONV (postoperative nausea and vomiting)    Premature surgical menopause    PTSD (post-traumatic stress disorder)    Sleep apnea     Past Surgical History:  Procedure Laterality Date   ANAL FISSURE REPAIR     APPENDECTOMY  2000   COLONOSCOPY W/ POLYPECTOMY  03/2015   COLONOSCOPY WITH ESOPHAGOGASTRODUODENOSCOPY (EGD)  04/01/2015   KNEE SURGERY      OOPHORECTOMY     OOPHORECTOMY     1995, 1998   ORIF ANKLE FRACTURE Right 11/28/2021   Procedure: OPEN REDUCTION INTERNAL FIXATION (ORIF) ANKLE FRACTURE;  Surgeon: Meredith Pel, MD;  Location: [facility];  Service: Orthopedics;  Laterality: Right;   PELVIC LAPAROSCOPY     x 4 for endometriosis    VAGINAL HYSTERECTOMY  1994    There were no vitals filed for this visit.    Subjective Assessment - 12/14/21 1024     Subjective Had a fall down stairs with R ankle fracture and R wrist 11/24/21 and is s/p ORIF 11/29/21.  Then had another fall 12/06/21 and hit head.  Noted that L hand was numb, she was throwing up, was found to have a R MCA CVA.  She was using knee scooter, however since this is what is causing falls, she has discontinued this and is only transferring to bedside commode and otherwise is remaining in bed.    Patient is accompained by: Family member   pts sister   Pertinent History Migraines, PTSD, recent R wrist and ankle fracture (NWB through RLE, NWB throug R wrist), CVA, ICA dissection    Limitations Walking;House hold activities;Standing  How long can you stand comfortably? not standing anymore (knee scooter is causing falls)    How long can you walk comfortably? not walking    Patient Stated Goals "To get back to normal"    Currently in Pain? Yes    Pain Score 1     Pain Location Generalized    Pain Orientation Right;Left    Pain Descriptors / Indicators Aching    Pain Type Acute pain    Pain Onset 1 to 4 weeks ago    Pain Frequency Constant    Aggravating Factors  Laying on R hips/buttocks    Pain Relieving Factors Rolling off of hip                [facility] PT Assessment - 12/14/21 1036       Assessment   Medical Diagnosis CVA/fall    Referring Provider (PT) Rosalin Hawking, MD; Sarina Ill, MD    Onset Date/Surgical Date 11/24/21    Hand Dominance Right      Precautions   Precautions Fall    Precaution Comments NWB R wrist, and NWB R LE    Required Braces or  Orthoses Other Brace/Splint    Other Brace/Splint spint on R wrist, cast on R ankle      Balance Screen   Has the patient fallen in the past 6 months Yes    Has the patient had a decrease in activity level because of a fear of falling?  Yes    Is the patient reluctant to leave their home because of a fear of falling?  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  (Difficult due to cast on RLE)R hip flex 3+/5, R knee ext 4/5, R knee flex 3/5      Bed Mobility   Bed Mobility Rolling Right;Rolling Left;Supine to Sit;Sit to Supine    Rolling Right --   mod I due to increased time/effort   Rolling Left --   mod I due to increased time/effort   Supine to Sit Supervision/Verbal cueing   cuing to maintain NWB through R wrist   Sit to Supine Supervision/Verbal cueing      Transfers   Transfers Sit to Stand;Stand to Sit;Lateral/Scoot Transfers    Sit to Stand 3: Mod assist    Sit  to Stand Details (indicate cue type and reason) Did attempt one stand facing // bars with LUE support at mod A with  cues for pushing from chair and transitioning to bar.  Assist for forward weight shift and to ensure NWB through RLE    Stand to Sit 3: Mod assist    Lateral/Scoot Transfers 4: Min assist;3: Mod assist    Lateral/Scoot Transfer Details (indicate cue type and reason) Pt able to transfer to the L at min A with cues for technique and sequencing along with maintaining NWB through R wrist.  PT did remove arm rest to ensure ease of transfer.  Pt requires more mod A when transferring R and needs increased cues for hand placement and technique.  She tends to just want to grab onto therapist as she does this at home with son.    Comments Pt very dizzy following stand, BP read 85/54 with HR 53.  Elevated LEs into chair and BP rechecked following several minutes and was 94/60.  Pt very quite/tired with eyes closed, continued dizziness therefore felt that she should be seen today and since PCP unable to see her, recommended she go to the ED.  Sister okay with taking her to ED, so PT/OT assisted with getting into car.      Ambulation/Gait   Ambulation/Gait No                        Objective measurements completed on examination: See above findings.                PT Education - 12/14/21 1333     Education Details Education on POC and goals.  Recommendations to go to ED due to increased dizziness/fatigue during session.  Educated sister to call back to schedule/see OT when pt is settled and appropriate to continue therapy.    Person(s) Educated Patient;Other (comment)   sister   Methods Explanation    Comprehension Verbalized understanding              PT Short Term Goals - 12/14/21 1344       PT SHORT TERM GOAL #1   Title Pt will be independent and compliant with initial HEP in order to improve functional mobility.  (Target Date: 01/13/22)    Time 4    Period Weeks    Status New    Target Date 01/13/22      PT SHORT TERM GOAL #2   Title Pt will perform lateral scoot  transfers both directions at min A level maintaining NWB precautions without cues.    Time 4    Period Weeks    Status New      PT SHORT TERM GOAL #3   Title Will assess and address any vertigo that may be present in order to dec dizziness.    Time 4    Period Weeks    Status New      PT SHORT TERM GOAL #4   Title Pt will perform sit<>stand at min A level maintaining NWB precautions.    Time 4    Period Weeks    Status New      PT SHORT TERM GOAL #5   Title Pt will report being out of bed in sitting position at least 4 hours of the day in order to improve upright tolerance.    Time 4    Period Weeks    Status New  PT Long Term Goals - 12/14/21 1347       PT LONG TERM GOAL #1   Title Pt will be IND with final HEP in order to indicate dec fall risk and improved functional mobility.  (Target Date: 02/12/22 (will update new LTGs to 03/14/22))    Time 8    Period Weeks    Status New    Target Date 02/12/22      PT LONG TERM GOAL #2   Title Pt will perform lateral scoot transfers at S level in order to indicate improved functional mobility.    Time 4    Period Weeks    Status New      PT LONG TERM GOAL #3   Title Pt will perform sit<>stand at S level to LRAD in order to indicate improved functional mobility.    Time 8    Period Weeks    Status New      PT LONG TERM GOAL #4   Title Pt will tolerate up to 5 mins of standing and report being out of bed at least 7 hours per day in order to improve upright tolerance.    Time 8    Period Weeks    Status New      PT LONG TERM GOAL #5   Title Will add gait goals when clearned to begin weight bearing.    Time 8    Period Weeks    Status New                    Plan - 12/14/21 1336     Clinical Impression Statement Pt is a 61 y/o female admitted 2/8 after fall. Found with R MCA CVA.  Noted recent fall with R wrist fracture (in splint) and R ankle fractue s/p ORIF 1/31. She is NWB at this time  and has follow up with ortho tomorrow.  PMH includes: migraine headaches, PTSD.  Pt very fatigued during PT eval but was able to participate in testing and transfers/bed mobility.  She requires up to mod A for transfers and brief standing.  Note that at end of evaluation (after standing), she became dizzy and light headed with more fatigue.  BP was 85/54.  Due to her history of ICA dissection and recent CVA, recommended she be seen by ED/PCP.  PCP unable to see her therefore recommended ED.  [facility] okay to take her and pt declined ambulance transport.  PT assisted into car.  Prior to standing, PT discussed deficits and the goal for her to be able to transfer herself to bedside commode.  She reports she is staying in bed all day, except for eating, in which she raises HOB.  Encouraged her to be out of bed for 30 mins to 1 hour 2x/day and increase daily as able to help regulate BP (feel that this may have been what contributed to medical issues at end of session).  Discussed standing and trying to use platform walker until cleared by ortho. She was unwilling in hospital, but is willing to try again.  Pt will benefit from skilled OP neuro to address deficits.    Personal Factors and Comorbidities Comorbidity 3+;Profession    Comorbidities see above    Examination-Activity Limitations Bathing;Bed Mobility;Dressing;Locomotion Level;Sit;Squat;Stairs;Stand;Transfers    Examination-Participation Restrictions Community Activity;Driving;Occupation;Meal Prep;Shop;Laundry    Stability/Clinical Decision Making Unstable/Unpredictable    Clinical Decision Making Moderate    Rehab Potential Good    PT Frequency 2x / week  PT Duration 12 weeks    PT Treatment/Interventions ADLs/Self Care Home Management;Aquatic Therapy;DME Instruction;Electrical Stimulation;Gait training;Stair training;Functional mobility training;Therapeutic activities;Therapeutic exercise;Balance training;Neuromuscular re-education;Cognitive  remediation;Patient/family education;Manual techniques;Dry needling;Vestibular;Passive range of motion    PT Next Visit Plan See how ED visit went, any new precautions?? Did we hear from [facility] regarding SLP and precautions regarding ICA dissection?, did ortho clear any restrictions?? Work on bed mobility and address vertigo as needed, lateral scoot transfers, sit<>stand, trial platform walker when appropriate.    Consulted and Agree with Plan of Care Patient;Family member/caregiver    Family Member Consulted sister             Patient will benefit from skilled therapeutic intervention in order to improve the following deficits and impairments:  Abnormal gait, Decreased activity tolerance, Cardiopulmonary status limiting activity, Decreased balance, Decreased cognition, Decreased endurance, Decreased knowledge of precautions, Decreased knowledge of use of DME, Decreased mobility, Decreased strength, Difficulty walking, Dizziness, Impaired perceived functional ability, Impaired flexibility, Postural dysfunction, Impaired UE functional use  Visit Diagnosis: Unsteadiness on feet  Muscle weakness (generalized)  Repeated falls  Dizziness and giddiness  Hemiplegia and hemiparesis following cerebral infarction affecting left non-dominant side [facility])     Problem List Patient Active Problem List   Diagnosis Date Noted   Acute right MCA stroke ([place]) 12/07/2021   GERD without esophagitis 12/07/2021   Right wrist fracture, with routine healing, subsequent encounter 12/07/2021   Bimalleolar ankle fracture, right, closed, with routine healing, subsequent encounter    Blepharospasm 11/08/2021   Sleep disorder, circadian, shift work type 08/01/2021   Excessive daytime sleepiness 08/01/2021   Morning headache 02/16/2021   Left eye pain 02/16/2021   Intractable episodic cluster headache 02/16/2021   Behavioral insomnia of childhood 02/16/2021   Retrognathia 02/16/2021   Chronic migraine without  aura, with intractable migraine, so stated, with status migrainosus 01/12/2021   Muscle spasm 10/14/2020   Vitamin D deficiency 08/15/2020   Acute bilateral low back pain with bilateral sciatica 07/13/2020   Gastric polyp 02/11/2019   Sessile colonic polyp 02/11/2019   Chronic fatigue 02/11/2019   Grade I internal hemorrhoids 02/11/2019   Ductal hyperplasia of breast 08/21/2018   Cognitive changes 06/24/2018   Dyslipidemia 06/24/2018   Dense breast tissue on mammogram 04/17/2018   Fibrocystic breast determined by biopsy 04/09/2018   Family history of breast cancer in mother 04/09/2018   Paroxysmal sinus tachycardia ([place]) 03/27/2018   Osteoporosis 08/28/2017   Facet arthropathy, lumbosacral 08/28/2017   Chronic left SI joint pain 08/27/2017   Left lumbar radiculopathy 08/27/2017   Allergic rhinitis 08/09/2017   Disorder of lipoid metabolism 08/09/2017   Insomnia secondary to anxiety 08/09/2017   Postmenopausal osteoporosis 08/09/2017   History of hysterectomy for benign disease 08/09/2017   GAD (generalized anxiety disorder) 08/09/2017   Former smoker, stopped smoking in distant past 08/09/2017   Minor depressive disorder 08/09/2017   Palpitations 03/16/2015   Family history of colon cancer 03/02/2015   Hiatal hernia with GERD without esophagitis 03/02/2015   History of adenomatous polyp of colon 03/02/2015   Chronic migraine without aura 10/10/2014   History of cardiovascular disorder 01/18/2003    Cameron Sprang, PT, MPT [facility] [address] [place], [place], [address] Phone: [phone]   Fax:  [phone] 12/14/21, 1:55 PM   Name: Kelise Kuch MRN: [id number] Date of Birth: [birth year]

## 2021-12-14 NOTE — ED Provider Notes (Signed)
[facility] EMERGENCY DEPARTMENT Provider Note   CSN: [id number] Arrival date & time: 12/14/21  1131     History  Chief Complaint  Patient presents with   Near Syncope    Alice Williams is a 61 y.o. female.  61 yo F with a chief complaints of a sensation like she might pass out.  She tells me that she was doing occupational therapy today and was rolling back and forth in the bed and she felt like her eyelids were both very heavy and then when she tried to stand up she felt like the room was spinning got very sweaty and then felt like she might pass out.  This lasted for a couple minutes and then it took her about 5 or 10 minutes to feel better.  She had her blood pressure checked and it was in the 80s.  She has had diarrhea recently but resolved a couple days ago.   Near Syncope      Home Medications Prior to Admission medications   Medication Sig Start Date End Date Taking? Authorizing Provider  acetaminophen (TYLENOL) 500 MG tablet Take 500 mg by mouth every 6 (six) hours as needed for mild pain, fever or headache.    [provider]  aspirin (ASPIRIN CHILDRENS) 81 MG chewable tablet Chew 1 tablet (81 mg total) by mouth daily. 12/08/21 03/08/22  Dahal, Marlowe Aschoff, MD  Atogepant 60 MG TABS Take 60 mg by mouth daily. 11/08/21   Melvenia Beam, MD  atorvastatin (LIPITOR) 40 MG tablet Take 1 tablet (40 mg total) by mouth daily. 12/09/21 01/08/22  Dahal, Marlowe Aschoff, MD  BOTOX 200 units SOLR INJECT 200 UNITS AS DIRECTED EVERY 3 (THREE) MONTHS. Patient not taking: Reported on 11/27/2021 09/28/21 09/28/22  Jaclyn Prime, Collene Leyden, PA-C  Botulinum Toxin Type A (BOTOX) 200 units SOLR Inject 200 Units as directed every 3 (three) months. 09/28/21   Jaclyn Prime, Collene Leyden, PA-C  Calcium Carbonate-Vitamin D 600-400 MG-UNIT tablet Take 1 tablet by mouth 2 (two) times daily. 08/09/17   Trixie Dredge, PA-C  clopidogrel (PLAVIX) 75 MG tablet Take 1 tablet (75 mg total) by  mouth daily. 12/09/21 01/08/22  Terrilee Croak, MD  Coenzyme Q10 (CO Q 10 PO) Take 1 tablet by mouth daily.    [provider]  escitalopram (LEXAPRO) 10 MG tablet TAKE 1 TABLET BY MOUTH ONCE DAILY Patient taking differently: Take 10 mg by mouth daily. 06/06/21 06/06/22  Saguier, Percell Miller, PA-C  methocarbamol (ROBAXIN) 500 MG tablet Take 1 tablet (500 mg total) by mouth every 8 (eight) hours as needed. Patient taking differently: Take 500 mg by mouth every 8 (eight) hours as needed for muscle spasms. 11/28/21   Magnant, Charles L, PA-C  nadolol (CORGARD) 40 MG tablet Take 1 tablet (40 mg total) by mouth daily. 09/13/21 09/13/22  Lelon Perla, MD  oxyCODONE-acetaminophen (PERCOCET) 5-325 MG tablet Take 1 tablet by mouth every 4 (four) hours as needed for severe pain. 11/28/21 11/28/22  Magnant, Charles L, PA-C  pantoprazole (PROTONIX) 40 MG tablet TAKE 1 TABLET BY MOUTH ONCE DAILY Patient taking differently: Take 40 mg by mouth daily. 02/07/21 02/07/22  Jackquline Denmark, MD  polycarbophil (FIBERCON) 625 MG tablet Take 625 mg by mouth daily.    [provider]  Riboflavin 400 MG CAPS Take 400 mg by mouth daily.    [provider]  Topiramate ER (TROKENDI XR) 200 MG CP24 Take 1 capsule (200 mg) by mouth daily. 11/08/21 11/08/22  Melvenia Beam, MD  traZODone (DESYREL) 50 MG tablet TAKE 1/2 - 1 TABLET BY MOUTH EVERY NIGHT AT BEDTIME AS NEEDED FOR SLEEP Patient not taking: Reported on 12/07/2021 11/08/20 12/07/21  Saguier, Percell Miller, PA-C      Allergies    Sulfa antibiotics, Sumatriptan succinate, and Emgality [galcanezumab-gnlm]    Review of Systems   Review of Systems  Cardiovascular:  Positive for near-syncope.   Physical Exam Updated Vital Signs BP (!) 164/88    Pulse 72    Temp (!) 97.4 F (36.3 C) (Oral)    Resp 16    SpO2 100%  Physical Exam Vitals and nursing note reviewed.  Constitutional:      General: She is not in acute distress.    Appearance: She is well-developed. She  is not diaphoretic.  HENT:     Head: Normocephalic.     Comments: Left frontal bruise appears old in age. Eyes:     Pupils: Pupils are equal, round, and reactive to light.  Cardiovascular:     Rate and Rhythm: Normal rate and regular rhythm.     Heart sounds: No murmur heard.   No friction rub. No gallop.  Pulmonary:     Effort: Pulmonary effort is normal.     Breath sounds: No wheezing or rales.  Abdominal:     General: There is no distension.     Palpations: Abdomen is soft.     Tenderness: There is no abdominal tenderness.  Musculoskeletal:        General: No tenderness.     Cervical back: Normal range of motion and neck supple.     Comments: Splint to the right foot.  Brace to the right wrist.  Skin:    General: Skin is warm and dry.  Neurological:     Mental Status: She is alert and oriented to person, place, and time.     Comments: Right-sided fast-growing nystagmus that does not induce dizziness.  Otherwise benign neurologic exam intact finger-nose and heel-to-shin.  Psychiatric:        Behavior: Behavior normal.    ED Results / Procedures / Treatments   Labs (all labs ordered are listed, but only abnormal results are displayed) Labs Reviewed  BASIC METABOLIC PANEL - Abnormal; Notable for the following components:      Result Value   CO2 19 (*)    Glucose, Bld 146 (*)    All other components within normal limits  CBC WITH DIFFERENTIAL/PLATELET - Abnormal; Notable for the following components:   Neutro Abs 8.6 (*)    All other components within normal limits  TROPONIN I (HIGH SENSITIVITY)  TROPONIN I (HIGH SENSITIVITY)    EKG EKG Interpretation  Date/Time:  Thursday December 14 2021 11:57:01 EST Ventricular Rate:  73 PR Interval:  140 QRS Duration: 68 QT Interval:  410 QTC Calculation: 451 R Axis:   77 Text Interpretation: Normal sinus rhythm Nonspecific ST and T wave abnormality Abnormal ECG When compared with ECG of 07-Dec-2021 09:15, PREVIOUS ECG IS  PRESENT scooped st segments inferiorly seen on prior though more pronounced. no wpw, prolonged qt or brugada Confirmed by Deno Etienne [phone]) on 12/14/2021 3:31:22 PM  Radiology CT ANGIO HEAD NECK W WO CM  Result Date: 12/14/2021 CLINICAL DATA:  Dizziness and lightheadedness a couple of days. Stroke. EXAM: CT ANGIOGRAPHY HEAD AND NECK TECHNIQUE: Multidetector CT imaging of the head and neck was performed using the standard protocol during bolus administration of intravenous contrast. Multiplanar CT image reconstructions  and MIPs were obtained to evaluate the vascular anatomy. Carotid stenosis measurements (when applicable) are obtained utilizing NASCET criteria, using the distal internal carotid diameter as the denominator. RADIATION DOSE REDUCTION: This exam was performed according to the departmental dose-optimization program which includes automated exposure control, adjustment of the mA and/or kV according to patient size and/or use of iterative reconstruction technique. CONTRAST:  56mL OMNIPAQUE IOHEXOL 350 MG/ML SOLN COMPARISON:  CT head 12/14/2021.  MRI head 12/07/2021 FINDINGS: CTA NECK FINDINGS Aortic arch: Standard branching. Imaged portion shows no evidence of aneurysm or dissection. No significant stenosis of the major arch vessel origins. Right carotid system: Segmental narrowing of the right internal carotid artery above the bifurcation. No associated calcification. There is diffuse circumferential thickening of the wall of the internal carotid in this area. No intraluminal thrombus. Probable dissection. Estimated 50% diameter stenosis. Left carotid system: Normal left carotid without atherosclerotic disease or stenosis. No dissection. Vertebral arteries: Both vertebral arteries patent to the skull base without stenosis. Skeleton: No acute skeletal abnormality.  Scoliosis. Other neck: Negative for mass or edema in the neck. Upper chest: Lung apices clear bilaterally. Review of the MIP images  confirms the above findings CTA HEAD FINDINGS Anterior circulation: Cavernous carotid widely patent bilaterally without stenosis. Anterior and middle cerebral arteries patent without stenosis or large vessel occlusion. No lesion to account for the right MCA infarct. Posterior circulation: Both vertebral arteries patent to the basilar without stenosis. Left PICA patent. Right PICA not visualized. Basilar widely patent. Superior cerebellar and posterior cerebral arteries patent without stenosis. Venous sinuses: Normal venous enhancement Anatomic variants: None Review of the MIP images confirms the above findings IMPRESSION: 1. Negative for intracranial large vessel occlusion or significant stenosis 2. Long segment stenosis of the right internal carotid artery which begins approximately 4 cm above the bifurcation. There is circumferential thickening of the wall of the artery in this area which is most likely dissection. No dissection flap or thrombus identified. 3. Left carotid normal.  Both vertebral arteries normal. 4. Code stroke imaging results were communicated on 12/14/2021 at 6:16 pm to provider Palikh via text page Electronically Signed   By: Franchot Gallo M.D.   On: 12/14/2021 18:20   CT Head Wo Contrast  Result Date: 12/14/2021 CLINICAL DATA:  Near syncopal episode in physical therapy, stood up, became dizzy, vision addend, fall on blood pressure, abnormal mental status; recent stroke EXAM: CT HEAD WITHOUT CONTRAST TECHNIQUE: Contiguous axial images were obtained from the base of the skull through the vertex without intravenous contrast. RADIATION DOSE REDUCTION: This exam was performed according to the departmental dose-optimization program which includes automated exposure control, adjustment of the mA and/or kV according to patient size and/or use of iterative reconstruction technique. COMPARISON:  12/07/2021 CT and MR exams FINDINGS: Brain: Mild atrophy. Stable ventricular morphology with slight  asymmetric decrease in frontal horn of RIGHT lateral ventricle. No midline shift or mass effect. Loss of gray-white differentiation and patchy decreased attenuation within RIGHT hemisphere involving the frontal parietal lobes as well as the temporal lobe and insular ribbon consistent with MCA territory infarct, better visualized and questionably slightly larger than on prior CT. No intracranial hemorrhage, new definite areas of infarction, or extra-axial fluid collection. No focal mass. Underlying small vessel chronic ischemic changes of deep cerebral white matter. Vascular: No definite hyperdense vessels. Skull: Intact Sinuses/Orbits: Scattered mucosal thickening in the ethmoid air cells and LEFT maxillary sinus Other: N/A IMPRESSION: Evolving RIGHT MCA territory infarct, better visualized and questionably slightly  larger than on the previous exam. Atrophy with small vessel chronic ischemic changes of deep cerebral white matter. No intracranial hemorrhage. Electronically Signed   By: Lavonia Dana M.D.   On: 12/14/2021 16:56    Procedures Procedures    Medications Ordered in ED Medications  sodium chloride 0.9 % bolus 1,000 mL (0 mLs Intravenous Stopped 12/14/21 1813)  iohexol (OMNIPAQUE) 350 MG/ML injection 75 mL (75 mLs Intravenous Contrast Given 12/14/21 1739)    ED Course/ Medical Decision Making/ A&P                           Medical Decision Making Amount and/or Complexity of Data Reviewed Radiology: ordered.  Risk Prescription drug management.   Patient is a 61 y.o. female with a cc of an event where she felt like she was get a pass out.  Patient states that she was rolling back and forth in the bed and she felt like her eyelids were very heavy and then when she tried to stand up she felt very dizzy.  Felt a spinning type sensation got sweaty and then felt she might pass out.  Improved when she laid back and rested.  Lasted for few minutes and then it took her about 10 minutes or so to  feel better.  She has never had an event like this happen to her before.  On record review the patient was just hospitalized for a right-sided carotid artery dissection with an MCA occlusion and stroke.  She has been recovering from this.  She also unfortunately had suffered a injury to her right ankle and right wrist.  Neuro exam without obvious finding.  Will discuss with neurology.  Her blood work does show a metabolic acidosis without anion gap which is consistent with her history of diarrhea.  We will give a bolus of IV fluids.  No significant anemia.  EKG looks somewhat similar to previous.  No arrhythmia.  I discussed the case with Dr. Reeves Forth, neurology.  He recommended a CT angiogram of the head and the neck to evaluate for status of her dissection.  If unchanged we felt it would be reasonable to have her follow-up as an outpatient.  CT scan has resulted.  I discussed this with neurology.  No obvious change by my view of the CT scan.  No longer occluded.  She is currently on medical treatment.  Recommended neurology follow-up in the office.  7:01 PM:  I have discussed the diagnosis/risks/treatment options with the patient and family.  Evaluation and diagnostic testing in the emergency department does not suggest an emergent condition requiring admission or immediate intervention beyond what has been performed at this time.  They will follow up with  PCP, neuro. We also discussed returning to the ED immediately if new or worsening sx occur. We discussed the sx which are most concerning (e.g., sudden worsening pain, fever, inability to tolerate by mouth ) that necessitate immediate return. Medications administered to the patient during their visit and any new prescriptions provided to the patient are listed below.  Medications given during this visit Medications  sodium chloride 0.9 % bolus 1,000 mL (0 mLs Intravenous Stopped 12/14/21 1813)  iohexol (OMNIPAQUE) 350 MG/ML injection 75 mL (75 mLs  Intravenous Contrast Given 12/14/21 1739)     The patient appears reasonably screen and/or stabilized for discharge and I doubt any other medical condition or other [facility] requiring further screening, evaluation, or treatment in the ED at  this time prior to discharge.          Final Clinical Impression(s) / ED Diagnoses Final diagnoses:  Near syncope    Rx / DC Orders ED Discharge Orders     None         Deno Etienne, DO 12/14/21 1901

## 2021-12-14 NOTE — Patient Outreach (Signed)
Received a hospital discharge referral from Natividad Brood, [facility] Liaison for Ms. Alice Williams. I  have assigned Valente David, RN to call for follow up and determine if there are any Case Management needs.    [facility], [place], [place] Management [phone]

## 2021-12-14 NOTE — Telephone Encounter (Signed)
Dr. Jaynee Eagles,   I just evaluated Alice Williams here at OP neuro today for PT.  I have so many questions about her.  I actually ended up sending her to the ED with her sister today at the end of my evaluation because she was so dizzy and light headed and BP had dropped to 85/54.  I honestly think that it was due to her being upright for so long and we did try and stand at the end of our session and she reports that she is staying in bed all day.    Once she is cleared to return, she reports that Dr. Erlinda Hong gave her precautions to not turn to the R due to her ICA dissection?? Are you aware of this, or ever heard of this?  Also I don't think she is being followed by a vascular MD for this dissection and I'm wondering if maybe she needs a referral to one??  Also since she was functioning at such a high level before, I think she may benefit from SLP evaluation.  Please write order if you agree and we can schedule when she does come back.    Thanks,  Cameron Sprang, PT, MPT [facility] [address] [place], [place], [address] Phone: [phone]   Fax:  [phone] 12/14/21, 2:01 PM

## 2021-12-14 NOTE — Discharge Instructions (Signed)
Please return for recurrent or worsening symptoms.

## 2021-12-14 NOTE — ED Provider Triage Note (Signed)
Emergency Medicine Provider Triage Evaluation Note  Alice Williams , a 61 y.o. female  was evaluated in triage.  Pt complains of lightheadedness.  Prior to arrival patient was at therapy when she moved from a seated to a standing position.  Patient reports that she started feeling lightheaded and sat back down.  Patient reports that after sitting her lightheadedness gradually resolved.  Patient denies any syncope.  No preceding chest pain, shortness of breath, or sudden onset of headache.  Patient denies any lightheadedness at present.  Patient reports that over the last few days she has been having diarrhea.  Diarrhea has since resolved.  Patient reports that she has had decreased p.o. intake.  Review of Systems  Positive: Lightheadedness Negative: Chest pain, shortness of breath, syncope, blood in stool, melena, vaginal bleeding, hematuria  Physical Exam  BP 103/69 (BP Location: Left Arm)    Pulse 73    Temp 98.7 F (37.1 C)    Resp 14    SpO2 99%  Gen:   Awake, no distress   Resp:  Normal effort, clear to auscultation bilaterally MSK:   Moves extremities without difficulty  Other:  +2 radial pulse bilaterally  Medical Decision Making  Medically screening exam initiated at 12:09 PM.  Appropriate orders placed.  Ronit Cranfield was informed that the remainder of the evaluation will be completed by another provider, this initial triage assessment does not replace that evaluation, and the importance of remaining in the ED until their evaluation is complete.     Loni Beckwith, PA-C 12/14/21 1210

## 2021-12-15 ENCOUNTER — Ambulatory Visit (INDEPENDENT_AMBULATORY_CARE_PROVIDER_SITE_OTHER): Payer: No Typology Code available for payment source

## 2021-12-15 ENCOUNTER — Ambulatory Visit (INDEPENDENT_AMBULATORY_CARE_PROVIDER_SITE_OTHER): Payer: No Typology Code available for payment source | Admitting: Orthopedic Surgery

## 2021-12-15 ENCOUNTER — Other Ambulatory Visit: Payer: Self-pay | Admitting: *Deleted

## 2021-12-15 ENCOUNTER — Encounter: Payer: Self-pay | Admitting: Orthopedic Surgery

## 2021-12-15 DIAGNOSIS — S82891A Other fracture of right lower leg, initial encounter for closed fracture: Secondary | ICD-10-CM

## 2021-12-15 NOTE — Patient Outreach (Signed)
[facility] [place]) Care Management  12/15/2021  Alice Williams [birth year] [id number]   Transition of care call   Referral received: 2/16 Initial outreach: 2/17 Insurance: [facility] Health Focus/Save/Choice Plan   Subjective: Initial successful telephone call to patient's preferred number in order to complete transition of care assessment; 2 HIPAA identifiers verified. Explained purpose of call and completed transition of care assessment.   States she is doing well, denies post op problems, states surgical pain well managed with prescribed medications, tolerating diet, denies bowel or bladder problems.  Usually lives alone but currently living with son, he is assisting with her recovery.      Objective:  Ms. Sedivy was hospitalized at [facility] on 1/31 for ORIF of the right ankle following a fall.  She was seen in the ED again on 2/8 for a fall, did not re-injure her ankle but was diagnosed with a stroke.  Was seen again in the ED on 2/16 for near syncope during outpatient PT evaluation.  Comorbidities include: chronic migraine, GERD, osteoporosis, and dyslipidemia.   She was discharged to home, orders for outpatient PT, OT, and ST all ordered.  She has knee scooter due to being non-weight bearing  Was seen by ortho specialist today, will follow up on 3/8.  Has appointment with PCP on 2/23 and with neurology on 3/28.  Son will provide transportation.   Assessment:  Patient voices good understanding of all discharge instructions.  See transition of care flowsheet for assessment details.   Plan:   No urgent care management needs identified but agrees to follow up within the next week.  Will successful outreach letter with [place] Management pamphlet and 24 Hour Nurse Line Magn[facility] to [facility] clinical pool to be mailed to patient's home address.   Valente David, RN, MSN, [place]  Manager [phone]

## 2021-12-15 NOTE — Progress Notes (Signed)
Post-Op Visit Note   Patient: Alice Williams           Date of Birth: Nov 12, 1960           MRN: [id number] Visit Date: 12/15/2021 PCP: Alice Pai, PA-C   Assessment & Plan:  Chief Complaint:  Chief Complaint  Patient presents with   Right Ankle - Routine Post Op   Visit Diagnoses:  1. Closed right ankle fracture, initial encounter     Plan: Alice Williams is a 61 year old patient right ankle open reduction internal fixation of trimalleolar ankle fracture.  She underwent fixation of the bimalleolar portion of that fracture.  Since she was last seen she has been diagnosed as having a stroke affecting her left side.  On examination the incisions are intact sutures were removed ankle range of motion is pretty reasonable.  Mortise is stable.  Radiographs look reasonable as well.  No calf tenderness negative Homans.  She is on Plavix.  Plan is nonweightbearing but okay to start range of motion exercises about 3-400 ankle pumps a day.  Thera-Band to help stretch out the heel cord.  Fracture boot to be worn but not at night.  Repeat radiographs in 2 weeks and we will start some weightbearing at that time in the fracture boot.  Follow-Up Instructions: Return in about 2 weeks (around 12/29/2021).   Orders:  Orders Placed This Encounter  Procedures   XR Ankle Complete Right   No orders of the defined types were placed in this encounter.   Imaging: CT ANGIO HEAD NECK W WO CM  Result Date: 12/14/2021 CLINICAL DATA:  Dizziness and lightheadedness a couple of days. Stroke. EXAM: CT ANGIOGRAPHY HEAD AND NECK TECHNIQUE: Multidetector CT imaging of the head and neck was performed using the standard protocol during bolus administration of intravenous contrast. Multiplanar CT image reconstructions and MIPs were obtained to evaluate the vascular anatomy. Carotid stenosis measurements (when applicable) are obtained utilizing NASCET criteria, using the distal internal carotid diameter as the denominator.  RADIATION DOSE REDUCTION: This exam was performed according to the departmental dose-optimization program which includes automated exposure control, adjustment of the mA and/or kV according to patient size and/or use of iterative reconstruction technique. CONTRAST:  40mL OMNIPAQUE IOHEXOL 350 MG/ML SOLN COMPARISON:  CT head 12/14/2021.  MRI head 12/07/2021 FINDINGS: CTA NECK FINDINGS Aortic arch: Standard branching. Imaged portion shows no evidence of aneurysm or dissection. No significant stenosis of the major arch vessel origins. Right carotid system: Segmental narrowing of the right internal carotid artery above the bifurcation. No associated calcification. There is diffuse circumferential thickening of the wall of the internal carotid in this area. No intraluminal thrombus. Probable dissection. Estimated 50% diameter stenosis. Left carotid system: Normal left carotid without atherosclerotic disease or stenosis. No dissection. Vertebral arteries: Both vertebral arteries patent to the skull base without stenosis. Skeleton: No acute skeletal abnormality.  Scoliosis. Other neck: Negative for mass or edema in the neck. Upper chest: Lung apices clear bilaterally. Review of the MIP images confirms the above findings CTA HEAD FINDINGS Anterior circulation: Cavernous carotid widely patent bilaterally without stenosis. Anterior and middle cerebral arteries patent without stenosis or large vessel occlusion. No lesion to account for the right MCA infarct. Posterior circulation: Both vertebral arteries patent to the basilar without stenosis. Left PICA patent. Right PICA not visualized. Basilar widely patent. Superior cerebellar and posterior cerebral arteries patent without stenosis. Venous sinuses: Normal venous enhancement Anatomic variants: None Review of the MIP images confirms the above findings IMPRESSION:  1. Negative for intracranial large vessel occlusion or significant stenosis 2. Long segment stenosis of the right  internal carotid artery which begins approximately 4 cm above the bifurcation. There is circumferential thickening of the wall of the artery in this area which is most likely dissection. No dissection flap or thrombus identified. 3. Left carotid normal.  Both vertebral arteries normal. 4. Code stroke imaging results were communicated on 12/14/2021 at 6:16 pm to provider Alice Williams via text page Electronically Signed   By: Alice Williams M.D.   On: 12/14/2021 18:20   CT Head Wo Contrast  Result Date: 12/14/2021 CLINICAL DATA:  Near syncopal episode in physical therapy, stood up, became dizzy, vision addend, fall on blood pressure, abnormal mental status; recent stroke EXAM: CT HEAD WITHOUT CONTRAST TECHNIQUE: Contiguous axial images were obtained from the base of the skull through the vertex without intravenous contrast. RADIATION DOSE REDUCTION: This exam was performed according to the departmental dose-optimization program which includes automated exposure control, adjustment of the mA and/or kV according to patient size and/or use of iterative reconstruction technique. COMPARISON:  12/07/2021 CT and MR exams FINDINGS: Brain: Mild atrophy. Stable ventricular morphology with slight asymmetric decrease in frontal horn of RIGHT lateral ventricle. No midline shift or mass effect. Loss of gray-white differentiation and patchy decreased attenuation within RIGHT hemisphere involving the frontal parietal lobes as well as the temporal lobe and insular ribbon consistent with MCA territory infarct, better visualized and questionably slightly larger than on prior CT. No intracranial hemorrhage, new definite areas of infarction, or extra-axial fluid collection. No focal mass. Underlying small vessel chronic ischemic changes of deep cerebral white matter. Vascular: No definite hyperdense vessels. Skull: Intact Sinuses/Orbits: Scattered mucosal thickening in the ethmoid air cells and LEFT maxillary sinus Other: N/A IMPRESSION:  Evolving RIGHT MCA territory infarct, better visualized and questionably slightly larger than on the previous exam. Atrophy with small vessel chronic ischemic changes of deep cerebral white matter. No intracranial hemorrhage. Electronically Signed   By: Lavonia Dana M.D.   On: 12/14/2021 16:56   XR Ankle Complete Right  Result Date: 12/15/2021 AP lateral mortise view right ankle reviewed.  Bimalleolar ankle fracture in good position alignment.  Posterior malleolar fragment also appears well reduced.  No appreciable change in hardware position compared to immediate postoperative radiographs   PMFS History: Patient Active Problem List   Diagnosis Date Noted   Acute right MCA stroke ([place]) 12/07/2021   GERD without esophagitis 12/07/2021   Right wrist fracture, with routine healing, subsequent encounter 12/07/2021   Bimalleolar ankle fracture, right, closed, with routine healing, subsequent encounter    Blepharospasm 11/08/2021   Sleep disorder, circadian, shift work type 08/01/2021   Excessive daytime sleepiness 08/01/2021   Morning headache 02/16/2021   Left eye pain 02/16/2021   Intractable episodic cluster headache 02/16/2021   Behavioral insomnia of childhood 02/16/2021   Retrognathia 02/16/2021   Chronic migraine without aura, with intractable migraine, so stated, with status migrainosus 01/12/2021   Muscle spasm 10/14/2020   Vitamin D deficiency 08/15/2020   Acute bilateral low back pain with bilateral sciatica 07/13/2020   Gastric polyp 02/11/2019   Sessile colonic polyp 02/11/2019   Chronic fatigue 02/11/2019   Grade I internal hemorrhoids 02/11/2019   Ductal hyperplasia of breast 08/21/2018   Cognitive changes 06/24/2018   Dyslipidemia 06/24/2018   Dense breast tissue on mammogram 04/17/2018   Fibrocystic breast determined by biopsy 04/09/2018   Family history of breast cancer in mother 04/09/2018   Paroxysmal  sinus tachycardia ([place]) 03/27/2018   Osteoporosis 08/28/2017    Facet arthropathy, lumbosacral 08/28/2017   Chronic left SI joint pain 08/27/2017   Left lumbar radiculopathy 08/27/2017   Allergic rhinitis 08/09/2017   Disorder of lipoid metabolism 08/09/2017   Insomnia secondary to anxiety 08/09/2017   Postmenopausal osteoporosis 08/09/2017   History of hysterectomy for benign disease 08/09/2017   GAD (generalized anxiety disorder) 08/09/2017   Former smoker, stopped smoking in distant past 08/09/2017   Minor depressive disorder 08/09/2017   Palpitations 03/16/2015   Family history of colon cancer 03/02/2015   Hiatal hernia with GERD without esophagitis 03/02/2015   History of adenomatous polyp of colon 03/02/2015   Chronic migraine without aura 10/10/2014   History of cardiovascular disorder 01/18/2003   Past Medical History:  Diagnosis Date   Anal fissure    Anemia    in early 20's   Anxiety    Arthritis    back   Breast calcifications on mammogram    Cancer ([place])    pre cancer colon,cervix   Colon polyp    Dyslipidemia 06/24/2018   Elevated cholesterol    Facet arthropathy, lumbosacral 08/28/2017   Family history of adverse reaction to anesthesia    sibling has N/V   GERD (gastroesophageal reflux disease)    Heart murmur    History of colon polyps    History of kidney stones    has small ones   IBS (irritable bowel syndrome)    Inappropriate sinus tachycardia    Migraines    Osteopenia    Osteoporosis of femur without pathological fracture 08/28/2017   Palpitations    PONV (postoperative nausea and vomiting)    Premature surgical menopause    PTSD (post-traumatic stress disorder)    Sleep apnea     Family History  Problem Relation Age of Onset   Colon cancer Mother    Breast cancer Mother 65       and liver   Migraines Mother    Hyperlipidemia Father    Alcohol abuse Father    Heart attack Father    Cancer Father        Head and neck    Hyperlipidemia Brother    Lung cancer Maternal Grandmother    Parkinson's  disease Maternal Grandfather    Dementia Maternal Grandfather    Prostate cancer Maternal Grandfather    Parkinson's disease Paternal Grandmother    Heart attack Paternal Grandfather    Kidney cancer Sister 62   Migraines Sister    Migraines Sister    Esophageal cancer Neg Hx     Past Surgical History:  Procedure Laterality Date   ANAL FISSURE REPAIR     APPENDECTOMY  2000   COLONOSCOPY W/ POLYPECTOMY  03/2015   COLONOSCOPY WITH ESOPHAGOGASTRODUODENOSCOPY (EGD)  04/01/2015   KNEE SURGERY     OOPHORECTOMY     OOPHORECTOMY     1995, 1998   ORIF ANKLE FRACTURE Right 11/28/2021   Procedure: OPEN REDUCTION INTERNAL FIXATION (ORIF) ANKLE FRACTURE;  Surgeon: Meredith Pel, MD;  Location: [facility];  Service: Orthopedics;  Laterality: Right;   PELVIC LAPAROSCOPY     x 4 for endometriosis    VAGINAL HYSTERECTOMY  1994   Social History   Occupational History   Occupation: RN  Tobacco Use   Smoking status: Former    Types: Cigarettes    Quit date: 08/09/2006    Years since quitting: 15.3   Smokeless tobacco: Never  Vaping Use  Vaping Use: Never used  Substance and Sexual Activity   Alcohol use: Not Currently    Comment: rare   Drug use: No   Sexual activity: Never    Birth control/protection: Surgical

## 2021-12-18 ENCOUNTER — Other Ambulatory Visit (HOSPITAL_COMMUNITY): Payer: Self-pay

## 2021-12-18 NOTE — Telephone Encounter (Signed)
Call for benefits

## 2021-12-19 ENCOUNTER — Other Ambulatory Visit (HOSPITAL_COMMUNITY): Payer: Self-pay

## 2021-12-19 ENCOUNTER — Other Ambulatory Visit: Payer: Self-pay | Admitting: Neurology

## 2021-12-19 DIAGNOSIS — I639 Cerebral infarction, unspecified: Secondary | ICD-10-CM

## 2021-12-20 ENCOUNTER — Inpatient Hospital Stay: Payer: No Typology Code available for payment source | Admitting: Medical

## 2021-12-20 ENCOUNTER — Other Ambulatory Visit (HOSPITAL_COMMUNITY): Payer: Self-pay

## 2021-12-21 ENCOUNTER — Other Ambulatory Visit (HOSPITAL_BASED_OUTPATIENT_CLINIC_OR_DEPARTMENT_OTHER): Payer: Self-pay

## 2021-12-21 ENCOUNTER — Other Ambulatory Visit (HOSPITAL_COMMUNITY): Payer: Self-pay

## 2021-12-21 ENCOUNTER — Ambulatory Visit (INDEPENDENT_AMBULATORY_CARE_PROVIDER_SITE_OTHER): Payer: No Typology Code available for payment source | Admitting: Medical

## 2021-12-21 VITALS — BP 97/63 | HR 81 | Temp 98.4°F | Resp 16 | Ht 67.0 in | Wt 130.0 lb

## 2021-12-21 DIAGNOSIS — I471 Supraventricular tachycardia: Secondary | ICD-10-CM

## 2021-12-21 DIAGNOSIS — I63511 Cerebral infarction due to unspecified occlusion or stenosis of right middle cerebral artery: Secondary | ICD-10-CM

## 2021-12-21 DIAGNOSIS — G43809 Other migraine, not intractable, without status migrainosus: Secondary | ICD-10-CM

## 2021-12-21 DIAGNOSIS — I9589 Other hypotension: Secondary | ICD-10-CM | POA: Diagnosis not present

## 2021-12-21 DIAGNOSIS — K219 Gastro-esophageal reflux disease without esophagitis: Secondary | ICD-10-CM | POA: Diagnosis not present

## 2021-12-21 DIAGNOSIS — R55 Syncope and collapse: Secondary | ICD-10-CM

## 2021-12-21 DIAGNOSIS — S82841D Displaced bimalleolar fracture of right lower leg, subsequent encounter for closed fracture with routine healing: Secondary | ICD-10-CM

## 2021-12-21 DIAGNOSIS — F3289 Other specified depressive episodes: Secondary | ICD-10-CM | POA: Diagnosis not present

## 2021-12-21 LAB — CBC WITH DIFFERENTIAL/PLATELET
Basophils Absolute: 0.1 10*3/uL (ref 0.0–0.1)
Basophils Relative: 0.5 % (ref 0.0–3.0)
Eosinophils Absolute: 0.2 10*3/uL (ref 0.0–0.7)
Eosinophils Relative: 1.6 % (ref 0.0–5.0)
HCT: 37.7 % (ref 36.0–46.0)
Hemoglobin: 12.6 g/dL (ref 12.0–15.0)
Lymphocytes Relative: 9.8 % — ABNORMAL LOW (ref 12.0–46.0)
Lymphs Abs: 1.1 10*3/uL (ref 0.7–4.0)
MCHC: 33.4 g/dL (ref 30.0–36.0)
MCV: 88.5 fl (ref 78.0–100.0)
Monocytes Absolute: 0.8 10*3/uL (ref 0.1–1.0)
Monocytes Relative: 7.1 % (ref 3.0–12.0)
Neutro Abs: 9 10*3/uL — ABNORMAL HIGH (ref 1.4–7.7)
Neutrophils Relative %: 81 % — ABNORMAL HIGH (ref 43.0–77.0)
Platelets: 253 10*3/uL (ref 150.0–400.0)
RBC: 4.26 Mil/uL (ref 3.87–5.11)
RDW: 13 % (ref 11.5–15.5)
WBC: 11.1 10*3/uL — ABNORMAL HIGH (ref 4.0–10.5)

## 2021-12-21 LAB — COMPREHENSIVE METABOLIC PANEL
ALT: 16 U/L (ref 0–35)
AST: 19 U/L (ref 0–37)
Albumin: 3.9 g/dL (ref 3.5–5.2)
Alkaline Phosphatase: 95 U/L (ref 39–117)
BUN: 11 mg/dL (ref 6–23)
CO2: 27 mEq/L (ref 19–32)
Calcium: 8.9 mg/dL (ref 8.4–10.5)
Chloride: 106 mEq/L (ref 96–112)
Creatinine, Ser: 0.75 mg/dL (ref 0.40–1.20)
GFR: 86.74 mL/min (ref >60.00)
Glucose, Bld: 95 mg/dL (ref 70–99)
Potassium: 3.5 mEq/L (ref 3.5–5.1)
Sodium: 139 mEq/L (ref 135–145)
Total Bilirubin: 0.5 mg/dL (ref 0.2–1.2)
Total Protein: 6 g/dL (ref 6.0–8.3)

## 2021-12-21 MED ORDER — PANTOPRAZOLE SODIUM 40 MG PO TBEC
DELAYED_RELEASE_TABLET | Freq: Every day | ORAL | 6 refills | Status: DC
  Filled 2021-12-21 – 2022-01-22 (×2): qty 30, 30d supply, fill #0
  Filled 2022-02-21: qty 30, 30d supply, fill #1
  Filled 2022-03-28: qty 30, 30d supply, fill #2
  Filled 2022-04-27: qty 30, 30d supply, fill #3
  Filled 2022-06-07: qty 30, 30d supply, fill #4
  Filled 2022-07-25: qty 30, 30d supply, fill #5

## 2021-12-21 NOTE — Progress Notes (Signed)
Subjective:    Patient ID: Alice Williams, female    DOB: 03/19/1961, 61 y.o.   MRN: [id number]  HPI  Pt in for follow up from hospitalization.  Since last year various events.   1/28/202. Post fall.  Final Impression:  1. Trimalleolar fracture of ankle, closed, right, initial encounter Acute  2. Nondisplaced fracture of triquetrum (cuneiform) bone, right wrist, initial encounter for closed fracture Acute  3. Fall, initial encounter Acute   Pt has splint for wrist and hand. Surgery for her ankle.  12/06/21.  Alice Williams is a 61 y.o. female with recent fall resulting in R wrist and ankle fracture. She had ORIF of the ankle about a week ago, has been home using a knee scooter. She reports some chronic weakness of the left leg due to sciatica and today while maneuvering her scooter out of the bathroom her left leg gave out and she fell backwards landing on her R hip/buttock and hitting the back of her head. She did not have LOC, but she vomited several times on the way here. Complaining of posterior headache and R buttock pain. She did not reinjure her ankle or wrist.   Wed Dec 06, 2021  1936 Hip/pelvis xray is normal. CT images reviewed, no traumatic injuries but there is a possible area of acute stroke on the R side that may explain her reported L leg weakness. She currently does not have any neuro deficits. Discussed with Dr. Langston Masker, [facility] who will accept for transfer. Patient is aware of plan. Will add basic labs in case there is evidence of stroke.  [CS]  2016 CBC and BMP are neg.  [CS]   [facility] 12[facility]06-[facility]21. Dc 12-09-2021  In the ED, patient was hemodynamically stable x-rays of the hip and pelvis that were unremarkable.   CT head showed possible acute stroke in the right hemisphere.  She was transferred to ED at [facility].  CTA head and neck showed an evolving infarct of the right MCA territory and an occlusion of the right M3 MCA branch, 70% stenosis of the mid  right cervical ICA MRI brain showed patchy acute stroke in the right MCA distribution.   Neurology was consulted.  She was out of TNK time window. Admitted to hospitalist service for stroke work-up.  Subjective: Patient was seen and examined this morning.   Lying on stretcher in the ED.  Not in distress.  No new symptoms.  Left leg weakness improved.   Discharge diagnosis: Principal Problem:   Acute right MCA stroke (HCC) Active Problems:   GAD (generalized anxiety disorder)   Bimalleolar ankle fracture, right, closed, with routine healing, subsequent encounter   GERD without esophagitis   Right wrist fracture, with routine healing, subsequent encounter   Hospital course: Acute right MCA stroke  -Presented with sudden weakness of left lower extremity leading to fall  -CT head and MRI brain as above showing acute stroke in the right MCA distribution -Admitted for stroke work-up -echocardiogram showed EF of 60 to 65%, no interatrial shunt. -In light of recent right leg surgery, ultrasound duplex scan of lower extremity was done which did not show any DVT.  Transcranial Doppler bubble study did not show any evidence of PFO. -A1c, LDL elevated 139 -Neurology consultation appreciated.  Punctate allergy of stroke at this time. -Pending PT/OT/ST eval -Prior to admission, patient was on aspirin 81 mg daily after recent surgery for DVT prophylaxis.  Neurology recommended DAPT with aspirin 81 mg  daily and Plavix 75 mg daily.  Also added Lipitor 40 mg daily.   Right cervical ICA stenosis Right ICA dissection -CTA neck noted 70% stenosis of right cervical ICA. Neurology discussed with interventional radiology.   -MRA head and neck was obtained which confirmed the same finding.  -per neurology, patient probably suffered right ICA dissection when she fell 2 weeks ago.  At that time, she had extensive pain in the right neck and right shoulder. The day before this admission, she was washing her  face and with significant pain at right neck and she had pulsatile tinnitus in the right ear the night before current admission. This is consistent with right ICA dissection. -Continue DAPT with ASA 325 and plavix 75 mg. -Repeat CTA head and neck in 2-3 months and then decide on further antiplatelet regimen. -Follow-up with neurology as an outpatient.   Hyperlipidemia -Lipitor 40 mg daily.   History of SVT -Patient has history of SVT, follows up with Dr. Harl Bowie as an outpatient.  She had a 24-hour Holter monitoring in the past.  -Continue to follow-up with cardiology as an outpatient.    History of migraine -On Topamax 200 mg daily.   Recent bimalleolar ankle fracture -Underwent surgical repair on 1/31. -Currently has limited weightbearing right lower extremity -Home meds include Robaxin 5 mg 3 times daily as needed -PT eval obtained.  Outpatient PT recommended -Outpatient orthopedic surgery follow-up   Right wrist fracture -Outpatient orthopedic surgery follow-up   GERD without esophagitis -Continue PPI     GAD (generalized anxiety disorder) -Continue Lexapro 10 mg daily,    Pt will see neurologist on 28th of march.   Pt is attending PT and OT.   Pt bp has recently been lower end. Has been 90/60 before her stroke. Pt has cut back on her corgard to 1/2 tab. She was on palpitation and svt. Since decreasing dose no palpitatin or svt. Followed by Dr. Bettina Gavia  Recent near syncope on 12-14-2021. Went to ED and work up negative.  Since dc from ED she sates no near syncope.  Phq-9 score 3. Prior depression. Lexapro is helping on 10 mg daily. Has trazadone to use if needed.  Review of Systems  Constitutional:  Negative for chills, fatigue and fever.  Respiratory:  Negative for cough, chest tightness, shortness of breath and wheezing.   Cardiovascular:  Negative for chest pain and palpitations.  Gastrointestinal:  Negative for abdominal distention, abdominal pain and blood in  stool.  Musculoskeletal:  Negative for back pain.  Psychiatric/Behavioral:  Positive for dysphoric mood. Negative for decreased concentration.        Ph-9-3.     Past Medical History:  Diagnosis Date   Anal fissure    Anemia    in early 20's   Anxiety    Arthritis    back   Breast calcifications on mammogram    Cancer ([place])    pre cancer colon,cervix   Colon polyp    Dyslipidemia 06/24/2018   Elevated cholesterol    Facet arthropathy, lumbosacral 08/28/2017   Family history of adverse reaction to anesthesia    sibling has N/V   GERD (gastroesophageal reflux disease)    Heart murmur    History of colon polyps    History of kidney stones    has small ones   IBS (irritable bowel syndrome)    Inappropriate sinus tachycardia    Migraines    Osteopenia    Osteoporosis of femur without pathological fracture 08/28/2017  Palpitations    PONV (postoperative nausea and vomiting)    Premature surgical menopause    PTSD (post-traumatic stress disorder)    Sleep apnea      Social History   Socioeconomic History   Marital status: Divorced    Spouse name: Not on file   Number of children: 1   Years of education: Not on file   Highest education level: Master's degree (e.g., MA, MS, MEng, MEd, MSW, MBA)  Occupational History   Occupation: RN  Tobacco Use   Smoking status: Former    Types: Cigarettes    Quit date: 08/09/2006    Years since quitting: 15.3   Smokeless tobacco: Never  Vaping Use   Vaping Use: Never used  Substance and Sexual Activity   Alcohol use: Not Currently    Comment: rare   Drug use: No   Sexual activity: Never    Birth control/protection: Surgical  Other Topics Concern   Not on file  Social History Narrative   Single, livevs alone in a 2 story house. Drinks two sodas a day. Does not exercise regularly.   Social Determinants of Health   Financial Resource Strain: Not on file  Food Insecurity: Not on file  Transportation Needs: Not on file   Physical Activity: Not on file  Stress: Not on file  Social Connections: Not on file  Intimate Partner Violence: Not on file    Past Surgical History:  Procedure Laterality Date   ANAL FISSURE REPAIR     APPENDECTOMY  2000   COLONOSCOPY W/ POLYPECTOMY  03/2015   COLONOSCOPY WITH ESOPHAGOGASTRODUODENOSCOPY (EGD)  04/01/2015   KNEE SURGERY     OOPHORECTOMY     OOPHORECTOMY     1995, 1998   ORIF ANKLE FRACTURE Right 11/28/2021   Procedure: OPEN REDUCTION INTERNAL FIXATION (ORIF) ANKLE FRACTURE;  Surgeon: Meredith Pel, MD;  Location: [facility];  Service: Orthopedics;  Laterality: Right;   PELVIC LAPAROSCOPY     x 4 for endometriosis    VAGINAL HYSTERECTOMY  1994    Family History  Problem Relation Age of Onset   Colon cancer Mother    Breast cancer Mother 71       and liver   Migraines Mother    Hyperlipidemia Father    Alcohol abuse Father    Heart attack Father    Cancer Father        Head and neck    Hyperlipidemia Brother    Lung cancer Maternal Grandmother    Parkinson's disease Maternal Grandfather    Dementia Maternal Grandfather    Prostate cancer Maternal Grandfather    Parkinson's disease Paternal Grandmother    Heart attack Paternal Grandfather    Kidney cancer Sister 33   Migraines Sister    Migraines Sister    Esophageal cancer Neg Hx     Allergies  Allergen Reactions   Sulfa Antibiotics Anaphylaxis   Sumatriptan Succinate Anaphylaxis   Emgality [Galcanezumab-Gnlm] Nausea Only    GI upset - diarrhea, nausea    Current Outpatient Medications on File Prior to Visit  Medication Sig Dispense Refill   acetaminophen (TYLENOL) 500 MG tablet Take 500 mg by mouth every 6 (six) hours as needed for mild pain, fever or headache.     aspirin (ASPIRIN CHILDRENS) 81 MG chewable tablet Chew 1 tablet (81 mg total) by mouth daily. 90 tablet 0   Atogepant 60 MG TABS Take 60 mg by mouth daily. 30 tablet 6   atorvastatin (LIPITOR)  40 MG tablet Take 1 tablet (40 mg  total) by mouth daily. 30 tablet 0   BOTOX 200 units SOLR INJECT 200 UNITS AS DIRECTED EVERY 3 (THREE) MONTHS. 1 each 3   Botulinum Toxin Type A (BOTOX) 200 units SOLR Inject 200 Units as directed every 3 (three) months. 1 each 1   Calcium Carbonate-Vitamin D 600-400 MG-UNIT tablet Take 1 tablet by mouth 2 (two) times daily. 60 tablet 11   clopidogrel (PLAVIX) 75 MG tablet Take 1 tablet (75 mg total) by mouth daily. 30 tablet 0   Coenzyme Q10 (CO Q 10 PO) Take 1 tablet by mouth daily.     escitalopram (LEXAPRO) 10 MG tablet TAKE 1 TABLET BY MOUTH ONCE DAILY (Patient taking differently: Take 10 mg by mouth daily.) 90 tablet 1   methocarbamol (ROBAXIN) 500 MG tablet Take 1 tablet (500 mg total) by mouth every 8 (eight) hours as needed. (Patient taking differently: Take 500 mg by mouth every 8 (eight) hours as needed for muscle spasms.) 30 tablet 0   nadolol (CORGARD) 40 MG tablet Take 1 tablet (40 mg total) by mouth daily. 90 tablet 3   oxyCODONE-acetaminophen (PERCOCET) 5-325 MG tablet Take 1 tablet by mouth every 4 (four) hours as needed for severe pain. 30 tablet 0   polycarbophil (FIBERCON) 625 MG tablet Take 625 mg by mouth daily.     Riboflavin 400 MG CAPS Take 400 mg by mouth daily.     Topiramate ER (TROKENDI XR) 200 MG CP24 Take 1 capsule (200 mg) by mouth daily. 90 capsule 3   traZODone (DESYREL) 50 MG tablet TAKE 1/2 - 1 TABLET BY MOUTH EVERY NIGHT AT BEDTIME AS NEEDED FOR SLEEP (Patient not taking: Reported on 12/07/2021) 30 tablet 3   No current facility-administered medications on file prior to visit.    BP 97/63 (BP Location: Left Arm, Patient Position: Sitting, Cuff Size: Small)    Pulse 81    Temp 98.4 F (36.9 C) (Oral)    Resp 16    Ht 5\' 7"  (1.702 m)    Wt 130 lb (59 kg)    SpO2 99%    BMI 20.36 kg/m       Objective:   Physical Exam  General Mental Status- Alert. General Appearance- Not in acute distress.   Skin General: Color- Normal Color. Moisture- Normal  Moisture.  Neck Carotid Arteries- Normal color. Moisture- Normal Moisture. No carotid bruits. No JVD.  Chest and Lung Exam Auscultation: Breath Sounds:-Normal.  Cardiovascular Auscultation:Rythm- Regular. Murmurs & Other Heart Sounds:Auscultation of the heart reveals- No Murmurs.  Abdomen Inspection:-Inspeection Normal. Palpation/Percussion:Note:No mass. Palpation and Percussion of the abdomen reveal- Non Tender, Non Distended + BS, no rebound or guarding.    Neurologic Cranial Nerve exam:- CN III-XII intact(No nystagmus), symmetric smile. Upper and lower ext gross movement intact.      Assessment & Plan:   Patient Instructions  Acute right MCA stroke   Neurology recommended DAPT with aspirin 81 mg daily and Plavix 75 mg daily.  Also added Lipitor 40 mg daily.   Right cervical ICA stenosis  -Repeat CTA head and neck in 2-3 months and then decide on further antiplatelet regimen. -Follow-up with neurology as an outpatient.   Hyperlipidemia -Lipitor 40 mg daily.   History of SVT but recent near syncope episode.  Do recommend that you continue to stay on the Corgard 1/2 tablet daily.  Make sure that you are well-hydrated.  Will check CBC and metabolic panel today.  History of migraine -On Topamax 200 mg daily.   Recent bimalleolar ankle fracture -Underwent surgical repair on 1/31. -Currently has limited weightbearing right lower extremity -Home meds include Robaxin 5 mg 3 times daily as needed -PT eval obtained.  Outpatient PT recommended -Outpatient orthopedic surgery follow-up   Right wrist fracture -Outpatient orthopedic surgery follow-up   GERD without esophagitis -Continue PPI.  Refill Protonix today.   GAD (generalized anxiety disorder).  With some mild depression despite recent circumstances.  If you are having insomnia recommend getting back on trazodone.  If mood or anxiety worsening can increase your Lexapro to 20 mg. -Continue Lexapro 10 mg daily  presently  Follow-up in the 3 weeks or sooner if needed.   Mackie Pai, PA-C

## 2021-12-21 NOTE — Patient Instructions (Signed)
Acute right MCA stroke   Neurology recommended DAPT with aspirin 81 mg daily and Plavix 75 mg daily.  Also added Lipitor 40 mg daily.   Right cervical ICA stenosis  -Repeat CTA head and neck in 2-3 months and then decide on further antiplatelet regimen. -Follow-up with neurology as an outpatient.   Hyperlipidemia -Lipitor 40 mg daily.   History of SVT but recent near syncope episode.  Do recommend that you continue to stay on the Corgard 1/2 tablet daily.  Make sure that you are well-hydrated.  Will check CBC and metabolic panel today.    History of migraine -On Topamax 200 mg daily.   Recent bimalleolar ankle fracture -Underwent surgical repair on 1/31. -Currently has limited weightbearing right lower extremity -Home meds include Robaxin 5 mg 3 times daily as needed -PT eval obtained.  Outpatient PT recommended -Outpatient orthopedic surgery follow-up   Right wrist fracture -Outpatient orthopedic surgery follow-up   GERD without esophagitis -Continue PPI.  Refill Protonix today.   GAD (generalized anxiety disorder).  With some mild depression despite recent circumstances.  If you are having insomnia recommend getting back on trazodone.  If mood or anxiety worsening can increase your Lexapro to 20 mg. -Continue Lexapro 10 mg daily presently  Follow-up in the 3 weeks or sooner if needed.

## 2021-12-22 ENCOUNTER — Other Ambulatory Visit: Payer: Self-pay | Admitting: *Deleted

## 2021-12-22 NOTE — Telephone Encounter (Signed)
Prior auth initiated for EVENITY via [email or web address] KEY: [id number]      Out-of-pocket cost due at time of visit: $100  Primary: [employer] co-insurance: $50 Admin fee co-insurance: $50  Secondary: n/a [employer] co-insurance:  Admin fee co-insurance:   Deductible: does not apply  Prior Auth: required PA# Valid:   Called [employer] to confirm benefits - ref# [id number]  ** This summary of benefits is an estimation of the patient's out-of-pocket cost. Exact cost may vary based on individual plan coverage.

## 2021-12-23 NOTE — Patient Outreach (Signed)
[facility]) Care Management  12/23/2021  Dwayne Begay 01/23/1961 [id number]   Outgoing call placed to member, state she is doing well.  Remains living with her son currently, will stay there until she is fully recovered and ready to go back to work.  She is active with outpatient PT, OT, and ST.  Was seen by PCP yesterday, no changes in plan of care, will follow up on 3/16.  Also has appointments with ortho on 3/8 and neurology on 3/28.  Denies any urgent concerns, encouraged to contact this care manager with questions.  Agrees have referral placed to [facility] for ongoing disease management.  Will close case to [facility] care management at this time.  Valente David, RN, MSN, [facility] Manager [phone]

## 2021-12-25 ENCOUNTER — Ambulatory Visit: Payer: No Typology Code available for payment source | Admitting: Occupational Therapy

## 2021-12-25 ENCOUNTER — Encounter: Payer: Self-pay | Admitting: Speech Pathology

## 2021-12-25 ENCOUNTER — Other Ambulatory Visit: Payer: Self-pay

## 2021-12-25 ENCOUNTER — Ambulatory Visit (HOSPITAL_COMMUNITY): Payer: No Typology Code available for payment source

## 2021-12-25 ENCOUNTER — Ambulatory Visit: Payer: No Typology Code available for payment source | Admitting: Speech Pathology

## 2021-12-25 ENCOUNTER — Encounter: Payer: Self-pay | Admitting: Occupational Therapy

## 2021-12-25 DIAGNOSIS — R471 Dysarthria and anarthria: Secondary | ICD-10-CM

## 2021-12-25 DIAGNOSIS — R2681 Unsteadiness on feet: Secondary | ICD-10-CM

## 2021-12-25 DIAGNOSIS — M25631 Stiffness of right wrist, not elsewhere classified: Secondary | ICD-10-CM

## 2021-12-25 DIAGNOSIS — M6281 Muscle weakness (generalized): Secondary | ICD-10-CM

## 2021-12-25 NOTE — Patient Instructions (Signed)
° °  When you are asked to repeat yourself, take a breath, give your voice power with volume  Practice 10 abdominal breaths  Straw phonation  2 minutes prior to PHoRTE and throughout the day and before any prolonged speaking  PHoRTE - twice a day    10 Loud AH's as loud as you can and as long as you can       To help coach you at home with your loud AH!!     2. 10 Pitch glides up, 10 pitch glides down   3. 10 sentences in loud high pitch voice, like you are calling your neighbor over the phone  4. 10 sentences in loud low authoritative pitch, like you are the boss  Use a good belly breath before each exercise- feel your abs contract in as you use your voice   Use a good abdominal breath to power your voice when talking  Keep reducing throat clears  Everything with abdominal breath

## 2021-12-25 NOTE — Therapy (Signed)
[facility] [address] [place], [place], [address] Phone: [phone]   Fax:  [phone]  Occupational Therapy Evaluation  Patient Details  Name: Alice Williams MRN: [id number] Date of Birth: 1961-04-19 Referring Provider (OT): Dr. Sarina Ill   Encounter Date: 12/25/2021   OT End of Session - 12/25/21 1208     Visit Number 1    Number of Visits 25    Date for OT Re-Evaluation 03/25/22    Authorization Type [employer] 2023  copay $40 per visit  No Ded , VL: [place]  Auth Reqd: after 12th visit    Authorization - Visit Number 1    Authorization - Number of Visits 12    OT Start Time 1103    OT Stop Time 1144    OT Time Calculation (min) 41 min    Activity Tolerance Patient tolerated treatment well    Behavior During Therapy [facility] for tasks assessed/performed             Past Medical History:  Diagnosis Date   Anal fissure    Anemia    in early 20's   Anxiety    Arthritis    back   Breast calcifications on mammogram    Cancer ([place])    pre cancer colon,cervix   Colon polyp    Dyslipidemia 06/24/2018   Elevated cholesterol    Facet arthropathy, lumbosacral 08/28/2017   Family history of adverse reaction to anesthesia    sibling has N/V   GERD (gastroesophageal reflux disease)    Heart murmur    History of colon polyps    History of kidney stones    has small ones   IBS (irritable bowel syndrome)    Inappropriate sinus tachycardia    Migraines    Osteopenia    Osteoporosis of femur without pathological fracture 08/28/2017   Palpitations    PONV (postoperative nausea and vomiting)    Premature surgical menopause    PTSD (post-traumatic stress disorder)    Sleep apnea     Past Surgical History:  Procedure Laterality Date   ANAL FISSURE REPAIR     APPENDECTOMY  2000   COLONOSCOPY W/ POLYPECTOMY  03/2015   COLONOSCOPY WITH ESOPHAGOGASTRODUODENOSCOPY (EGD)  04/01/2015   KNEE SURGERY     OOPHORECTOMY      OOPHORECTOMY     1995, 1998   ORIF ANKLE FRACTURE Right 11/28/2021   Procedure: OPEN REDUCTION INTERNAL FIXATION (ORIF) ANKLE FRACTURE;  Surgeon: Meredith Pel, MD;  Location: [facility];  Service: Orthopedics;  Laterality: Right;   PELVIC LAPAROSCOPY     x 4 for endometriosis    VAGINAL HYSTERECTOMY  1994    There were no vitals filed for this visit.   Subjective Assessment - 12/25/21 1109     Subjective  Pt reports that she feels like her LLE has gotten stronger and she can transfer easier.    Limitations NWB on R foot and R hand (next appt with MD 01/03/22) s/p fall 11/28/21    Patient Stated Goals get back to where I was before (return to living alone and go back to work)    Currently in Pain? No/denies               [facility] OT Assessment - 12/25/21 0001       Assessment   Medical Diagnosis CVA/fall    Referring Provider (OT) Dr. Sarina Ill    Onset Date/Surgical Date 11/28/21    Hand Dominance  Right    Next MD Visit 01/03/22 ortho for R wrist/foot      Precautions   Precautions Fall    Precaution Comments NWB R wrist, and NWB R LE    Required Braces or Orthoses Other Brace/Splint    Other Brace/Splint spint on R wrist, cast on R ankle      Restrictions   Weight Bearing Restrictions Yes    RUE Weight Bearing Weight bear through elbow only    RLE Weight Bearing Non weight bearing      Balance Screen   Has the patient fallen in the past 6 months Yes   2 (during CVA and in bathroom after hospital d/c)     Home  Environment   Family/patient expects to be discharged to: Private residence    Lives With Son   staying with son currently, lived alone prior     Prior Function   Level of Independence Independent    Vocation Full time employment    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   Grooming Set up     Upper Body Bathing Set up    Lower Body Bathing Set up    Upper Body Dressing --   mod I   Lower Body Dressing Modified independent    Toilet Transfer Supervision/safety    Toileting - Clothing Manipulation Modified independent    [place] Transfer --   not performing currently, sponge bathing   Transfers/Ambulation Related to ADL's supervision stand pivot on LLE per pt report      IADL   Prior Level of Function Shopping independent    Shopping --   dependent   Prior Level of Function Light Housekeeping independent    Light Housekeeping --   dependent   Prior Level of Function Meal Prep independent    Meal Prep Needs to have meals prepared and served    Prior Level of Function Teacher, adult education on family or friends for transportation    Medication Management Is responsible for taking medication in correct dosages at correct time    Financial Management --   independent, automatic bill pay     Mobility   Mobility Status --   uses w/c, able to self-propel some, stands for approx 3 min at table per pt report     Written Expression   Dominant Hand Right    Handwriting --   Not test     Vision - History   Baseline Vision Wears glasses all the time    Additional Comments Pt denies visual changes      Activity Tolerance   Activity Tolerance Comments Pt reports only standing for 49min and that she fatigues quickly with activity      Cognition   Overall Cognitive Status Within Functional Limits for tasks assessed   pt/son denies changes   Cognition Comments Pt able to remind son's [employer] left on L side and direct son how to assist her      Sensation   Light Touch Appears Intact      Coordination   9 Hole Peg Test Right;Left    Right 9 Hole Peg Test 22.25    Left 9 Hole Peg Test 25.65  ROM / Strength   AROM / PROM / Strength AROM;Strength      AROM   Overall AROM  Deficits     Overall AROM Comments LUE WNL, RUE WNL except L wrist ROM not tested due to precautions      Strength   Overall Strength Deficits    Overall Strength Comments R shoulder strength grossly 4/5 (elbow/distal strength not tested due to precautions), L shoulder/elbow strength grossly 4/5      Hand Function   Right Hand Grip (lbs) NT due to precautions    Left Hand Grip (lbs) 44.3                                OT Short Term Goals - 12/25/21 1231       OT SHORT TERM GOAL #1   Title Pt will be independent with initial HEP for strength/activity tolerance.--check STGs 01/23/22    Time 4    Period Weeks    Status New      OT SHORT TERM GOAL #2   Title Pt will be independent with initial HEP for R wrist ROM (once cleared by MD).    Time 4    Period Weeks    Status New      OT SHORT TERM GOAL #3   Title Pt will perform BADLs mod I.    Time 4    Period Weeks    Status New      OT SHORT TERM GOAL #4   Title Pt will perform simple home maintenance/snack prep with supervision.    Time 4    Period Weeks    Status New               OT Long Term Goals - 12/25/21 1234       OT LONG TERM GOAL #1   Title Pt will be independent with updated strengthening HEP for bilateral UEs/R wrist (once cleared by MD).--check LTGs 03/25/22    Time 12    Period Weeks    Status New      OT LONG TERM GOAL #2   Title Pt will perform simple clooking and cleaning tasks mod I.    Time 12    Period Weeks    Status New      OT LONG TERM GOAL #3   Title Pt will be able to complete IADL task for at least 51min prior to rest break.    Time 12    Period Weeks    Status New      OT LONG TERM GOAL #4   Title Pt will demo at least 35lbs R grip strength for lifting/carrying objects and opening containers.    Time 12    Period Weeks    Status New      OT LONG TERM GOAL #5   Title Pt will demo at least 60* wrist flex/ext for ADLs/IADLs.    Time 12    Period Weeks    Status  New                   Plan - 12/25/21 1212     Clinical Impression Statement Pt is a 61 y.o. female referred to occupational therapy s/p CVA and falls.  Pt with recent fall with R wrist fracture (in splint) and R ankle fractue s/p ORIF 1/31.  Pt then was admitted to the hospital 12/06/21 after 2nd fall  and was found with R MCA CVA.  Pt is NWB RLE and R wrist at this time.   Pt with PMH that includes:SVT, migraine headaches, PTSD, anxiety, irritable bowel syndrome, GERD, osteoporosis, sleep apnea, left sciatica.  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  Pt presents with decr strength/activity tolerance, decr balance/functional mobility, decr R wrist ROM.  Pt would benefit from occupational therapy to address these deficits for incr UE functional use and incr independence with ADLs/IADLs.    OT Occupational Profile and History Detailed Assessment- Review of Records and additional review of physical, cognitive, psychosocial history related to current functional performance    Occupational performance deficits (Please refer to evaluation for details): ADL's;IADL's;Work;Leisure    Body Structure / Function / Physical Skills ADL;Decreased knowledge of use of DME;Strength;Balance;UE functional use;ROM;IADL;Endurance;Mobility    Rehab Potential Good    Clinical Decision Making Several treatment options, min-mod task modification necessary    Comorbidities Affecting Occupational Performance: May have comorbidities impacting occupational performance    Modification or Assistance to Complete Evaluation  No modification of tasks or assist necessary to complete eval    OT Frequency 2x / week    OT Duration 12 weeks   +eval, may modify based on scheduling needs/when precautions are lifted   OT Treatment/Interventions Self-care/ADL training;Moist Heat;Fluidtherapy;DME and/or AE instruction;Splinting;Therapeutic activities;Balance training;Contrast Bath;Ultrasound;Therapeutic  exercise;Passive range of motion;Functional Mobility Training;Neuromuscular education;Cryotherapy;Electrical Stimulation;Paraffin;Manual Therapy;Patient/family education;Energy conservation    Plan send note with pt for clarification of precuations for R wrist (appt with ortho MD 01/03/22); initiate HEP for LUE strength (yellow theraband?), functional movement/activity tolerance    Consulted and Agree with Plan of Care Patient;Family member/caregiver    Family Member Consulted son             Patient will benefit from skilled therapeutic intervention in order to improve the following deficits and impairments:   Body Structure / Function / Physical Skills: ADL, Decreased knowledge of use of DME, Strength, Balance, UE functional use, ROM, IADL, Endurance, Mobility       Visit Diagnosis: Muscle weakness (generalized)  Unsteadiness on feet  Stiffness of right wrist, not elsewhere classified    Problem List Patient Active Problem List   Diagnosis Date Noted   Acute right MCA stroke ([place]) 12/07/2021   GERD without esophagitis 12/07/2021   Right wrist fracture, with routine healing, subsequent encounter 12/07/2021   Bimalleolar ankle fracture, right, closed, with routine healing, subsequent encounter    Blepharospasm 11/08/2021   Sleep disorder, circadian, shift work type 08/01/2021   Excessive daytime sleepiness 08/01/2021   Morning headache 02/16/2021   Left eye pain 02/16/2021   Intractable episodic cluster headache 02/16/2021   Behavioral insomnia of childhood 02/16/2021   Retrognathia 02/16/2021   Chronic migraine without aura, with intractable migraine, so stated, with status migrainosus 01/12/2021   Muscle spasm 10/14/2020   Vitamin D deficiency 08/15/2020   Acute bilateral low back pain with bilateral sciatica 07/13/2020   Gastric polyp 02/11/2019   Sessile colonic polyp 02/11/2019   Chronic fatigue 02/11/2019   Grade I internal hemorrhoids 02/11/2019   Ductal  hyperplasia of breast 08/21/2018   Cognitive changes 06/24/2018   Dyslipidemia 06/24/2018   Dense breast tissue on mammogram 04/17/2018   Fibrocystic breast determined by biopsy 04/09/2018   Family history of breast cancer in mother 04/09/2018   Paroxysmal sinus tachycardia ([place]) 03/27/2018   Osteoporosis 08/28/2017   Facet arthropathy, lumbosacral 08/28/2017   Chronic left SI joint pain  08/27/2017   Left lumbar radiculopathy 08/27/2017   Allergic rhinitis 08/09/2017   Disorder of lipoid metabolism 08/09/2017   Insomnia secondary to anxiety 08/09/2017   Postmenopausal osteoporosis 08/09/2017   History of hysterectomy for benign disease 08/09/2017   GAD (generalized anxiety disorder) 08/09/2017   Former smoker, stopped smoking in distant past 08/09/2017   Minor depressive disorder 08/09/2017   Palpitations 03/16/2015   Family history of colon cancer 03/02/2015   Hiatal hernia with GERD without esophagitis 03/02/2015   History of adenomatous polyp of colon 03/02/2015   Chronic migraine without aura 10/10/2014   History of cardiovascular disorder 01/18/2003    [facility], OT 12/25/2021, 12:42 PM  [facility] [address] [place], [place], [address] Phone: [phone]   Fax:  [phone]  Name: Alice Williams MRN: [id number] Date of Birth: 1961-07-20  Vianne Bulls, OTR/L [facility] [address] [place], [place]  [address] [phone] phone [phone] 12/25/21 12:42 PM

## 2021-12-25 NOTE — Therapy (Signed)
[facility] [address] [place], [place], [address] Phone: [phone]   Fax:  [phone]  Speech Language Pathology Evaluation  Patient Details  Name: Alice Williams MRN: [id number] Date of Birth: 1961/01/17 Referring Provider (SLP): Dr. Rosalin Hawking   Encounter Date: 12/25/2021   End of Session - 12/25/21 1341     Visit Number 1    Number of Visits 25    Date for SLP Re-Evaluation 03/23/22    Authorization Type needs auth after 12 visits combined - will need to monitor OT/PT visits    Authorization - Visit Number 3   combined   Authorization - Number of Visits 12    SLP Start Time 1015    SLP Stop Time  1100    SLP Time Calculation (min) 45 min    Activity Tolerance Patient tolerated treatment well             Past Medical History:  Diagnosis Date   Anal fissure    Anemia    in early 20's   Anxiety    Arthritis    back   Breast calcifications on mammogram    Cancer ([place])    pre cancer colon,cervix   Colon polyp    Dyslipidemia 06/24/2018   Elevated cholesterol    Facet arthropathy, lumbosacral 08/28/2017   Family history of adverse reaction to anesthesia    sibling has N/V   GERD (gastroesophageal reflux disease)    Heart murmur    History of colon polyps    History of kidney stones    has small ones   IBS (irritable bowel syndrome)    Inappropriate sinus tachycardia    Migraines    Osteopenia    Osteoporosis of femur without pathological fracture 08/28/2017   Palpitations    PONV (postoperative nausea and vomiting)    Premature surgical menopause    PTSD (post-traumatic stress disorder)    Sleep apnea     Past Surgical History:  Procedure Laterality Date   ANAL FISSURE REPAIR     APPENDECTOMY  2000   COLONOSCOPY W/ POLYPECTOMY  03/2015   COLONOSCOPY WITH ESOPHAGOGASTRODUODENOSCOPY (EGD)  04/01/2015   KNEE SURGERY     OOPHORECTOMY     OOPHORECTOMY     1995, 1998   ORIF ANKLE  FRACTURE Right 11/28/2021   Procedure: OPEN REDUCTION INTERNAL FIXATION (ORIF) ANKLE FRACTURE;  Surgeon: Meredith Pel, MD;  Location: [facility];  Service: Orthopedics;  Laterality: Right;   PELVIC LAPAROSCOPY     x 4 for endometriosis    VAGINAL HYSTERECTOMY  1994    There were no vitals filed for this visit.   Subjective Assessment - 12/25/21 1030     Patient is accompained by: Family member   son Cecilie Lowers   Currently in Pain? No/denies                SLP Evaluation [facility] - 12/25/21 1030       SLP Visit Information   SLP Received On 12/25/21    Referring Provider (SLP) Dr. Rosalin Hawking    Onset Date 12/06/21    Medical Diagnosis R CVA      Subjective   Subjective "I fell 1/27 and hurt my right side, then I fell because I had a stroke"    Patient/Family Stated Goal To be understood      General Information   HPI Pt is a 60 y/o female admitted 2/8 after fall. MRI 12/06/21 revealed R  MCA CVA.  Noted recent fall with R wrist fracture (in splint) and R ankle fractue s/p ORIF 1/31. PMH includes: migraine headaches, PTSD.    Mobility Status walks independenlty, boot cast, on PT caseload      Balance Screen   Has the patient fallen in the past 6 months Yes    How many times? 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     Cognition   Overall Cognitive Status Within Functional Limits for tasks assessed   pt and her son deny any changes in memory, attention or safety/decsion making     Auditory Comprehension   Overall Auditory Comprehension Appears within functional limits for tasks assessed      Reading Comprehension   Reading Status Within funtional limits      Expression   Primary Mode of Expression Verbal      Verbal  Expression   Overall Verbal Expression Appears within functional limits for tasks assessed      Written Expression   Dominant Hand Right    Written Expression Not tested      Oral Motor/Sensory Function   Overall Oral Motor/Sensory Function Appears within functional limits for tasks assessed      Motor Speech   Overall Motor Speech Impaired    Respiration Impaired    Level of Impairment Sentence    Phonation Hoarse;Low vocal intensity    Resonance Within functional limits    Articulation Within functional limitis    Intelligibility Intelligibility reduced    Word 75-100% accurate    Phrase 75-100% accurate    Sentence 75-100% accurate    Conversation 75-100% accurate    Motor Planning Witnin functional limits    Motor Speech Errors Aware    Effective Techniques Increased vocal intensity                             SLP Education - 12/25/21 1340     Education Details HEP PHoRTE, compensations for dysarthria    Person(s) Educated Patient;Child(ren)    Methods Explanation;Demonstration;Verbal cues;Handout    Comprehension Verbalized understanding;Returned demonstration;Verbal cues required;Need further instruction              SLP Short Term Goals - 12/25/21 1347       SLP SHORT TERM GOAL #1   Title Pt will complete loud AH with average of 85dB over 2 sessions with rare min A    Time 4    Period Weeks    Status New      SLP SHORT TERM GOAL #2   Title Pt will complete HEP for voice/dysarthria (PHoRTE) with rare min A over 2 sessions    Time 4    Period Weeks    Status New      SLP SHORT TERM GOAL #3   Title Pt will average 72dB 18/20 sentences in structured speech tasks    Time 4    Period Weeks    Status New      SLP SHORT TERM GOAL #4   Title Pt will maintain average of 70dB and clear phonation over 8 minute conversation with occasional min  A    Time 4    Period Weeks    Status New              SLP Long Term Goals - 12/25/21  1352       SLP LONG TERM GOAL #1   Title Pt will complete HEP for dysarthria with mod I    Time 12    Period Weeks    Status New      SLP LONG TERM GOAL #2   Title Pt will self correct low, hoarse voice with rare min A as needed over 2 sessions    Time 12    Period Weeks    Status New      SLP LONG TERM GOAL #3   Title Pt will average 70dB over 15 minute conversation with rare min A    Time 12    Period Weeks    Status New      SLP LONG TERM GOAL #4   Title Pt will be 100% intelligible in noisy environment with rare min A over 15 minute conversation    Time 12    Period Weeks    Status New      SLP LONG TERM GOAL #5   Title Pt will improve score on Voice Related Quality of Life Measure (VRQOL) raw score by 2 points    Baseline raw score - 20; calculated score - 75    Time 12    Period Weeks    Status New              Plan - 12/25/21 1342     Clinical Impression Statement Alice Williams is referred by neurology for cognition and speech s/p CVA. Prior to fall then CVA, Alice Williams was independent it all IADL's and worked full time as a Marine scientist at [employer]. Today she is accompanied by her adult son, Cecilie Lowers. They both deny any memory changes, personality chages and concern with attention, safety/decision making. Cecilie Lowers affirms that he is comfortable leaving Alice Williams home alone for short periods and does not worry that she will make unsafe decisions. They both endorse that Kairi is managing her meds and finances independently. Today, Alice Williams presents with mild to moderate hypokinetic dysarthria with low volume and hoarse voice. Cecilie Lowers states that he does have to have Alice Williams repeat herself several times a day due to reduced intelligibility. Alice Williams endorses low, hoarse voice which she finds frustrating. Alice Williams scored a raw score of 20 and calculated score of 75 on the Voice Related Quality of Life (VRQOL) Measure (greater than 80 is WNL), indicating she has "a lot" of troube speaking  loudly, being heard in noisy situations, running out of air when talking, and having to repeat her self. She rated a small amont of trouble using the phone because of her voice. No vocal abuses noted durng evaluation.  .   Measured when a sound level meter was placed 30 cm away from pts mouth, 6 minutes of conversational speech was reduced today, at average 64dB (WNL= average 70-72dB) with range of 62 to 66dB. Overall speech intelligibility for this listener in a quiet environment was not affected, at approx 100%. Production of loud /a/ averaged 83dB (range of 80 to 85dB) and usual mod cues needed for loudness.   With instructions for breath support and to give her voice power,  oral reading  task pt was asked to use the same amount of effort as with loud /a/. Loudness average with this increased  effort was 68dB (range of 66 to 72) with usual min to mod A  for loudness. As loudness increased, phonation became clear. Initiated training pt in HEP for dysarthria/voice (PHoRTE) with usual min A and cues for breath support. Pt would benefit from skilled ST in order to improve speech intelligibility and pts QOL.    Speech Therapy Frequency 2x / week    Duration 12 weeks    Treatment/Interventions SLP instruction and feedback;Compensatory strategies;Functional tasks;Patient/family education;Multimodal communcation approach;Internal/external aids;Environmental controls    Potential to Achieve Goals Good             Patient will benefit from skilled therapeutic intervention in order to improve the following deficits and impairments:   Dysarthria    Problem List Patient Active Problem List   Diagnosis Date Noted   Acute right MCA stroke ([place]) 12/07/2021   GERD without esophagitis 12/07/2021   Right wrist fracture, with routine healing, subsequent encounter 12/07/2021   Bimalleolar ankle fracture, right, closed, with routine healing, subsequent encounter    Blepharospasm 11/08/2021   Sleep  disorder, circadian, shift work type 08/01/2021   Excessive daytime sleepiness 08/01/2021   Morning headache 02/16/2021   Left eye pain 02/16/2021   Intractable episodic cluster headache 02/16/2021   Behavioral insomnia of childhood 02/16/2021   Retrognathia 02/16/2021   Chronic migraine without aura, with intractable migraine, so stated, with status migrainosus 01/12/2021   Muscle spasm 10/14/2020   Vitamin D deficiency 08/15/2020   Acute bilateral low back pain with bilateral sciatica 07/13/2020   Gastric polyp 02/11/2019   Sessile colonic polyp 02/11/2019   Chronic fatigue 02/11/2019   Grade I internal hemorrhoids 02/11/2019   Ductal hyperplasia of breast 08/21/2018   Cognitive changes 06/24/2018   Dyslipidemia 06/24/2018   Dense breast tissue on mammogram 04/17/2018   Fibrocystic breast determined by biopsy 04/09/2018   Family history of breast cancer in mother 04/09/2018   Paroxysmal sinus tachycardia ([place]) 03/27/2018   Osteoporosis 08/28/2017   Facet arthropathy, lumbosacral 08/28/2017   Chronic left SI joint pain 08/27/2017   Left lumbar radiculopathy 08/27/2017   Allergic rhinitis 08/09/2017   Disorder of lipoid metabolism 08/09/2017   Insomnia secondary to anxiety 08/09/2017   Postmenopausal osteoporosis 08/09/2017   History of hysterectomy for benign disease 08/09/2017   GAD (generalized anxiety disorder) 08/09/2017   Former smoker, stopped smoking in distant past 08/09/2017   Minor depressive disorder 08/09/2017   Palpitations 03/16/2015   Family history of colon cancer 03/02/2015   Hiatal hernia with GERD without esophagitis 03/02/2015   History of adenomatous polyp of colon 03/02/2015   Chronic migraine without aura 10/10/2014   History of cardiovascular disorder 01/18/2003    Alice Williams, Alice Williams, Alice Williams 12/25/2021, 1:56 PM  [facility] [address] [place], [place], [address] Phone: [phone]    Fax:  [phone]  Name: Alice Williams MRN: [id number] Date of Birth: 06-Mar-1961

## 2021-12-26 NOTE — Telephone Encounter (Signed)
Approved Key: [id number] - PA Case ID: [id number]

## 2021-12-26 NOTE — Telephone Encounter (Signed)
Needs copay enrollment

## 2022-01-01 ENCOUNTER — Ambulatory Visit: Payer: No Typology Code available for payment source | Attending: Neurology | Admitting: Physical Therapy

## 2022-01-01 ENCOUNTER — Ambulatory Visit: Payer: No Typology Code available for payment source | Admitting: Occupational Therapy

## 2022-01-01 ENCOUNTER — Ambulatory Visit: Payer: No Typology Code available for payment source

## 2022-01-01 ENCOUNTER — Other Ambulatory Visit: Payer: Self-pay

## 2022-01-01 ENCOUNTER — Encounter: Payer: Self-pay | Admitting: Occupational Therapy

## 2022-01-01 DIAGNOSIS — R2681 Unsteadiness on feet: Secondary | ICD-10-CM | POA: Insufficient documentation

## 2022-01-01 DIAGNOSIS — R296 Repeated falls: Secondary | ICD-10-CM | POA: Insufficient documentation

## 2022-01-01 DIAGNOSIS — M25631 Stiffness of right wrist, not elsewhere classified: Secondary | ICD-10-CM | POA: Insufficient documentation

## 2022-01-01 DIAGNOSIS — R471 Dysarthria and anarthria: Secondary | ICD-10-CM | POA: Insufficient documentation

## 2022-01-01 DIAGNOSIS — M6281 Muscle weakness (generalized): Secondary | ICD-10-CM

## 2022-01-01 DIAGNOSIS — I69354 Hemiplegia and hemiparesis following cerebral infarction affecting left non-dominant side: Secondary | ICD-10-CM | POA: Diagnosis present

## 2022-01-01 DIAGNOSIS — R42 Dizziness and giddiness: Secondary | ICD-10-CM | POA: Diagnosis present

## 2022-01-01 NOTE — Therapy (Signed)
[facility] [address] [place], [place], [address] Phone: [phone]   Fax:  [phone]  Occupational Therapy Treatment  Patient Details  Name: Alice Williams MRN: [id number] Date of Birth: 04-21-1961 Referring Provider (OT): Dr. Sarina Ill   Encounter Date: 01/01/2022   OT End of Session - 01/01/22 1100     Visit Number 2    Number of Visits 25    Date for OT Re-Evaluation 03/25/22    Authorization Type [employer] 2023  copay $40 per visit  No Ded , VL: [place]  Auth Reqd: after 12th visit    Authorization - Visit Number 2    Authorization - Number of Visits 12    OT Start Time 1100    OT Stop Time 1145    OT Time Calculation (min) 45 min    Activity Tolerance Patient tolerated treatment well    Behavior During Therapy [facility] for tasks assessed/performed             Past Medical History:  Diagnosis Date   Anal fissure    Anemia    in early 20's   Anxiety    Arthritis    back   Breast calcifications on mammogram    Cancer ([place])    pre cancer colon,cervix   Colon polyp    Dyslipidemia 06/24/2018   Elevated cholesterol    Facet arthropathy, lumbosacral 08/28/2017   Family history of adverse reaction to anesthesia    sibling has N/V   GERD (gastroesophageal reflux disease)    Heart murmur    History of colon polyps    History of kidney stones    has small ones   IBS (irritable bowel syndrome)    Inappropriate sinus tachycardia    Migraines    Osteopenia    Osteoporosis of femur without pathological fracture 08/28/2017   Palpitations    PONV (postoperative nausea and vomiting)    Premature surgical menopause    PTSD (post-traumatic stress disorder)    Sleep apnea     Past Surgical History:  Procedure Laterality Date   ANAL FISSURE REPAIR     APPENDECTOMY  2000   COLONOSCOPY W/ POLYPECTOMY  03/2015   COLONOSCOPY WITH ESOPHAGOGASTRODUODENOSCOPY (EGD)  04/01/2015   KNEE SURGERY     OOPHORECTOMY      OOPHORECTOMY     1995, 1998   ORIF ANKLE FRACTURE Right 11/28/2021   Procedure: OPEN REDUCTION INTERNAL FIXATION (ORIF) ANKLE FRACTURE;  Surgeon: Meredith Pel, MD;  Location: [facility];  Service: Orthopedics;  Laterality: Right;   PELVIC LAPAROSCOPY     x 4 for endometriosis    VAGINAL HYSTERECTOMY  1994    There were no vitals filed for this visit.   Subjective Assessment - 01/01/22 1059     Subjective  "a little pain in the ankle but it's gotten better"    Limitations NWB on R foot and R hand (next appt with MD 01/03/22) s/p fall 11/28/21    Patient Stated Goals get back to where I was before (return to living alone and go back to work)    Currently in Pain? Yes   reports pain earlier but has gotten better   Pain Score 0-No pain                  Working on standing tolerance with maintaiing NWB precautions with RUE and RLE. Pt worked on reaching and placing resistance clothespins 1-8# with LUE while standing at tabletop.  Pt was able to tolerate standing for 47m4s the first time and 118ms on second attempt.    Access Code: [email or web address]: [email or web address] Date: 01/01/2022 Prepared by: KiWaldo LaineExercises Seated Elbow Flexion with Resistance - 1 x daily - 7 x weekly - 3 sets - 10 reps Seated Single Arm Shoulder Flexion - 1 x daily - 7 x weekly - 3 sets - 10 reps Seated Shoulder Abduction with Resistance - 1 x daily - 7 x weekly - 3 sets - 10 reps Seated Shoulder Extension with Resistance - 1 x daily - 7 x weekly - 3 sets - 10 reps Seated Elbow Extension with Self-Anchored Resistance - 1 x daily - 7 x weekly - 3 sets - 10 reps    Seated Cane/Dowel exercises for shoulder only - shoulder flexion, chest press x 12 reps.        OT Education - 01/01/22 1125     Education Details sent note to MD for 3/8 appt re: RUE wrist precautions    Person(s) Educated Patient    Methods Explanation;Handout    Comprehension Verbalized understanding               OT Short Term Goals - 01/01/22 1217       OT SHORT TERM GOAL #1   Title Pt will be independent with initial HEP for strength/activity tolerance.--check STGs 01/23/22    Time 4    Period Weeks    Status On-going      OT SHORT TERM GOAL #2   Title Pt will be independent with initial HEP for R wrist ROM (once cleared by MD).    Time 4    Period Weeks    Status On-going      OT SHORT TERM GOAL #3   Title Pt will perform BADLs mod I.    Time 4    Period Weeks    Status New      OT SHORT TERM GOAL #4   Title Pt will perform simple home maintenance/snack prep with supervision.    Time 4    Period Weeks    Status New               OT Long Term Goals - 12/25/21 1234       OT LONG TERM GOAL #1   Title Pt will be independent with updated strengthening HEP for bilateral UEs/R wrist (once cleared by MD).--check LTGs 03/25/22    Time 12    Period Weeks    Status New      OT LONG TERM GOAL #2   Title Pt will perform simple clooking and cleaning tasks mod I.    Time 12    Period Weeks    Status New      OT LONG TERM GOAL #3   Title Pt will be able to complete IADL task for at least 3054mprior to rest break.    Time 12    Period Weeks    Status New      OT LONG TERM GOAL #4   Title Pt will demo at least 35lbs R grip strength for lifting/carrying objects and opening containers.    Time 12    Period Weeks    Status New      OT LONG TERM GOAL #5   Title Pt will demo at least 60* wrist flex/ext for ADLs/IADLs.    Time 12    Period Weeks    Status New  Plan - 01/01/22 1210     Clinical Impression Statement Pt verbalized understanding of goals. Pt expressing wanting to get back to work and motivated for increasing activity tolerance.    OT Occupational Profile and History Detailed Assessment- Review of Records and additional review of physical, cognitive, psychosocial history related to current functional performance     Occupational performance deficits (Please refer to evaluation for details): ADL's;IADL's;Work;Leisure    Body Structure / Function / Physical Skills ADL;Decreased knowledge of use of DME;Strength;Balance;UE functional use;ROM;IADL;Endurance;Mobility    Rehab Potential Good    Clinical Decision Making Several treatment options, min-mod task modification necessary    Comorbidities Affecting Occupational Performance: May have comorbidities impacting occupational performance    Modification or Assistance to Complete Evaluation  No modification of tasks or assist necessary to complete eval    OT Frequency 2x / week    OT Duration 12 weeks   +eval, may modify based on scheduling needs/when precautions are lifted   OT Treatment/Interventions Self-care/ADL training;Moist Heat;Fluidtherapy;DME and/or AE instruction;Splinting;Therapeutic activities;Balance training;Contrast Bath;Ultrasound;Therapeutic exercise;Passive range of motion;Functional Mobility Training;Neuromuscular education;Cryotherapy;Electrical Stimulation;Paraffin;Manual Therapy;Patient/family education;Energy conservation    Plan sent note for MD (appt 3/8) - check to see if further precaution update, check on yellow theraband exercises, supine shoulder dowel exercises (we did seated 3/6 session)    Consulted and Agree with Plan of Care Patient;Family member/caregiver    Family Member Consulted son             Patient will benefit from skilled therapeutic intervention in order to improve the following deficits and impairments:   Body Structure / Function / Physical Skills: ADL, Decreased knowledge of use of DME, Strength, Balance, UE functional use, ROM, IADL, Endurance, Mobility       Visit Diagnosis: Muscle weakness (generalized)  Unsteadiness on feet  Hemiplegia and hemiparesis following cerebral infarction affecting left non-dominant side [facility])    Problem List Patient Active Problem List   Diagnosis Date Noted   Acute  right MCA stroke ([place]) 12/07/2021   GERD without esophagitis 12/07/2021   Right wrist fracture, with routine healing, subsequent encounter 12/07/2021   Bimalleolar ankle fracture, right, closed, with routine healing, subsequent encounter    Blepharospasm 11/08/2021   Sleep disorder, circadian, shift work type 08/01/2021   Excessive daytime sleepiness 08/01/2021   Morning headache 02/16/2021   Left eye pain 02/16/2021   Intractable episodic cluster headache 02/16/2021   Behavioral insomnia of childhood 02/16/2021   Retrognathia 02/16/2021   Chronic migraine without aura, with intractable migraine, so stated, with status migrainosus 01/12/2021   Muscle spasm 10/14/2020   Vitamin D deficiency 08/15/2020   Acute bilateral low back pain with bilateral sciatica 07/13/2020   Gastric polyp 02/11/2019   Sessile colonic polyp 02/11/2019   Chronic fatigue 02/11/2019   Grade I internal hemorrhoids 02/11/2019   Ductal hyperplasia of breast 08/21/2018   Cognitive changes 06/24/2018   Dyslipidemia 06/24/2018   Dense breast tissue on mammogram 04/17/2018   Fibrocystic breast determined by biopsy 04/09/2018   Family history of breast cancer in mother 04/09/2018   Paroxysmal sinus tachycardia ([place]) 03/27/2018   Osteoporosis 08/28/2017   Facet arthropathy, lumbosacral 08/28/2017   Chronic left SI joint pain 08/27/2017   Left lumbar radiculopathy 08/27/2017   Allergic rhinitis 08/09/2017   Disorder of lipoid metabolism 08/09/2017   Insomnia secondary to anxiety 08/09/2017   Postmenopausal osteoporosis 08/09/2017   History of hysterectomy for benign disease 08/09/2017   GAD (generalized anxiety disorder) 08/09/2017   Former smoker,  stopped smoking in distant past 08/09/2017   Minor depressive disorder 08/09/2017   Palpitations 03/16/2015   Family history of colon cancer 03/02/2015   Hiatal hernia with GERD without esophagitis 03/02/2015   History of adenomatous polyp of colon 03/02/2015    Chronic migraine without aura 10/10/2014   History of cardiovascular disorder 01/18/2003    Zachery Conch, OT 01/01/2022, 12:17 PM  [facility] [address] [place], [place], [address] Phone: [phone]   Fax:  [phone]  Name: Alice Williams MRN: [id number] Date of Birth: Feb 12, 1961

## 2022-01-01 NOTE — Patient Instructions (Addendum)
Dr. Marlou Sa, ? ?We are seeing this patient at the [facility] outpatient clinic for occupational therapy. We are trying to get some clarification re: her RUE wrist precautions, please advise on the following: ? ?Is she cleared for: ? ?AROM? ? ?PROM? ? ?How much longer for the splint wear? ? ?Cleared for weight bearing? Anticipate how much longer for weight bearing through hand/strengthening? ? ?Thank you so much for all you do, ? ? ? ?Waldo Laine, MOT, OTR/L  ?Occupational Therapist ?[phone] ?

## 2022-01-01 NOTE — Therapy (Signed)
[facility] [address] [place], [place], [address] Phone: [phone]   Fax:  [phone]  Speech Language Pathology Treatment  Patient Details  Name: Alice Williams MRN: [id number] Date of Birth: [birth year] Referring Provider (SLP): Dr. Rosalin Hawking   Encounter Date: 01/01/2022   End of Session - 01/01/22 1246     Visit Number 2    Number of Visits 25    Date for SLP Re-Evaluation 03/23/22    Authorization Type needs auth after 12 visits combined - will need to monitor OT/PT visits    Authorization - Visit Number 6    Authorization - Number of Visits 12    SLP Start Time 3716    SLP Stop Time  1230    SLP Time Calculation (min) 45 min    Activity Tolerance Patient tolerated treatment well;Patient limited by lethargy             Past Medical History:  Diagnosis Date   Anal fissure    Anemia    in early 20's   Anxiety    Arthritis    back   Breast calcifications on mammogram    Cancer ([place])    pre cancer colon,cervix   Colon polyp    Dyslipidemia 06/24/2018   Elevated cholesterol    Facet arthropathy, lumbosacral 08/28/2017   Family history of adverse reaction to anesthesia    sibling has N/V   GERD (gastroesophageal reflux disease)    Heart murmur    History of colon polyps    History of kidney stones    has small ones   IBS (irritable bowel syndrome)    Inappropriate sinus tachycardia    Migraines    Osteopenia    Osteoporosis of femur without pathological fracture 08/28/2017   Palpitations    PONV (postoperative nausea and vomiting)    Premature surgical menopause    PTSD (post-traumatic stress disorder)    Sleep apnea     Past Surgical History:  Procedure Laterality Date   ANAL FISSURE REPAIR     APPENDECTOMY  2000   COLONOSCOPY W/ POLYPECTOMY  03/2015   COLONOSCOPY WITH ESOPHAGOGASTRODUODENOSCOPY (EGD)  04/01/2015   KNEE SURGERY     OOPHORECTOMY     OOPHORECTOMY     1995, 1998    ORIF ANKLE FRACTURE Right 11/28/2021   Procedure: OPEN REDUCTION INTERNAL FIXATION (ORIF) ANKLE FRACTURE;  Surgeon: Meredith Pel, MD;  Location: [facility];  Service: Orthopedics;  Laterality: Right;   PELVIC LAPAROSCOPY     x 4 for endometriosis    VAGINAL HYSTERECTOMY  1994    There were no vitals filed for this visit.   Subjective Assessment - 01/01/22 1147     Subjective "they can hear me much better"    Currently in Pain? No/denies                   ADULT SLP TREATMENT - 01/01/22 1147       General Information   Behavior/Cognition Alert;Cooperative;Pleasant mood      Treatment Provided   Treatment provided Cognitive-Linquistic      Cognitive-Linquistic Treatment   Treatment focused on Dysarthria;Patient/family/caregiver education    Skilled Treatment Subjective improvements reported, including less frequent repetition and being better understood at home, including at a distance. SLP re-educated abdominal breathing and PHoRTE with occasional min to mod A required to optimize accuracy and carryover. Loud /a/ average upper 80s dB with rare min A. Oral reading at sentence  level was WNL. Oral reading at paragraph level was suboptimal for volume. Pt rated effort as 2-3/10 initially. With multiple repetitions, pt able to achieve 8/10 effort level which improved volume and clarity.      Assessment / Recommendations / Plan   Plan Continue with current plan of care      Progression Toward Goals   Progression toward goals Progressing toward goals              SLP Education - 01/01/22 1151     Education Details abdominal breathing, PHoRTE, inflection    Person(s) Educated Patient    Methods Explanation;Demonstration;Handout    Comprehension Verbalized understanding;Returned demonstration;Need further instruction              SLP Short Term Goals - 01/01/22 1158       SLP SHORT TERM GOAL #1   Title Pt will complete loud AH with average of 85dB over 2  sessions with rare min A    Baseline 01-01-22    Time 4    Period Weeks    Status On-going      SLP SHORT TERM GOAL #2   Title Pt will complete HEP for voice/dysarthria (PHoRTE) with rare min A over 2 sessions    Time 4    Period Weeks    Status On-going      SLP SHORT TERM GOAL #3   Title Pt will average 72dB 18/20 sentences in structured speech tasks    Baseline 01-01-22    Time 4    Period Weeks    Status On-going      SLP SHORT TERM GOAL #4   Title Pt will maintain average of 70dB and clear phonation over 8 minute conversation with occasional min A    Time 4    Period Weeks    Status On-going              SLP Long Term Goals - 01/01/22 1216       SLP LONG TERM GOAL #1   Title Pt will complete HEP for dysarthria with mod I    Time 12    Period Weeks    Status On-going      SLP LONG TERM GOAL #2   Title Pt will self correct low, hoarse voice with rare min A as needed over 2 sessions    Time 12    Period Weeks    Status On-going      SLP LONG TERM GOAL #3   Title Pt will average 70dB over 15 minute conversation with rare min A    Time 12    Period Weeks    Status On-going      SLP LONG TERM GOAL #4   Title Pt will be 100% intelligible in noisy environment with rare min A over 15 minute conversation    Time 12    Period Weeks    Status On-going      SLP LONG TERM GOAL #5   Title Pt will improve score on Voice Related Quality of Life Measure (VRQOL) raw score by 2 points    Baseline raw score - 20; calculated score - 75    Time 12    Period Weeks    Status On-going              Plan - 01/01/22 1247     Clinical Impression Statement Alice Williams is referred by neurology for cognition and speech s/p CVA.Today, Alice Williams presents with mild  hypokinetic dysarthria with low volume and rare hoarse voice. SLP educated and instructed abdominal breathing and PHoRTE exercises to maximize vocal loudness and intensity. Pt would benefit from skilled ST intervention  to optimize speech intelligilbility and communication effectiveness.    Speech Therapy Frequency 2x / week    Duration 12 weeks    Treatment/Interventions SLP instruction and feedback;Compensatory strategies;Functional tasks;Patient/family education;Multimodal communcation approach;Internal/external aids;Environmental controls    Potential to Achieve Goals Good             Patient will benefit from skilled therapeutic intervention in order to improve the following deficits and impairments:   Dysarthria and anarthria    Problem List Patient Active Problem List   Diagnosis Date Noted   Acute right MCA stroke ([place]) 12/07/2021   GERD without esophagitis 12/07/2021   Right wrist fracture, with routine healing, subsequent encounter 12/07/2021   Bimalleolar ankle fracture, right, closed, with routine healing, subsequent encounter    Blepharospasm 11/08/2021   Sleep disorder, circadian, shift work type 08/01/2021   Excessive daytime sleepiness 08/01/2021   Morning headache 02/16/2021   Left eye pain 02/16/2021   Intractable episodic cluster headache 02/16/2021   Behavioral insomnia of childhood 02/16/2021   Retrognathia 02/16/2021   Chronic migraine without aura, with intractable migraine, so stated, with status migrainosus 01/12/2021   Muscle spasm 10/14/2020   Vitamin D deficiency 08/15/2020   Acute bilateral low back pain with bilateral sciatica 07/13/2020   Gastric polyp 02/11/2019   Sessile colonic polyp 02/11/2019   Chronic fatigue 02/11/2019   Grade I internal hemorrhoids 02/11/2019   Ductal hyperplasia of breast 08/21/2018   Cognitive changes 06/24/2018   Dyslipidemia 06/24/2018   Dense breast tissue on mammogram 04/17/2018   Fibrocystic breast determined by biopsy 04/09/2018   Family history of breast cancer in mother 04/09/2018   Paroxysmal sinus tachycardia ([place]) 03/27/2018   Osteoporosis 08/28/2017   Facet arthropathy, lumbosacral 08/28/2017   Chronic left SI  joint pain 08/27/2017   Left lumbar radiculopathy 08/27/2017   Allergic rhinitis 08/09/2017   Disorder of lipoid metabolism 08/09/2017   Insomnia secondary to anxiety 08/09/2017   Postmenopausal osteoporosis 08/09/2017   History of hysterectomy for benign disease 08/09/2017   GAD (generalized anxiety disorder) 08/09/2017   Former smoker, stopped smoking in distant past 08/09/2017   Minor depressive disorder 08/09/2017   Palpitations 03/16/2015   Family history of colon cancer 03/02/2015   Hiatal hernia with GERD without esophagitis 03/02/2015   History of adenomatous polyp of colon 03/02/2015   Chronic migraine without aura 10/10/2014   History of cardiovascular disorder 01/18/2003    Marzetta Board, CCC-SLP 01/01/2022, 12:49 PM  [facility] [address] [place], [place], [address] Phone: [phone]   Fax:  [phone]   Name: Alice Williams MRN: [id number] Date of Birth: 1961-02-17

## 2022-01-01 NOTE — Patient Instructions (Signed)
?  When you are asked to repeat yourself, take a breath, give your voice power with volume ?  ?Practice 10 abdominal breaths ?  ?Straw phonation  2 minutes prior to [place] and throughout the day and before any prolonged speaking ?  ?PHoRTE - twice a day ?   ? 10 Loud AH's as loud as you can and as long as you can ? ?    ?2. 10 Pitch glides up, 10 pitch glides down  ?  ?3. 10 sentences in loud high pitch voice, like you are calling your neighbor over the phone ?  ?4. 10 sentences in loud low authoritative pitch, like you are the boss ?  ?Use a good belly breath before each exercise- feel your abs contract in as you use your voice ?  ?  ?Use a good abdominal breath to power your voice when talking ?  ?Keep reducing throat clears ?  ?Everything with abdominal breath ?

## 2022-01-01 NOTE — Therapy (Signed)
OUTPATIENT PHYSICAL THERAPY TREATMENT NOTE   Patient Name: Alice Williams MRN: [id number] DOB:July 30, 1961, 61 y.o., female Today's Date: 01/01/2022  PCP: Mackie Pai, PA-C REFERRING PROVIDER: Melvenia Beam, MD    PT End of Session - 01/01/22 1019     Visit Number 2    Number of Visits 25    Date for PT Re-Evaluation 03/14/22    Authorization Type [employer] [facility] Needs auth after 12th visit    Authorization - Visit Number 1    Authorization - Number of Visits 12    PT Start Time 8315    PT Stop Time 1058    PT Time Calculation (min) 43 min    Equipment Utilized During Treatment Gait belt    Activity Tolerance Patient limited by lethargy;Treatment limited secondary to medical complications (Comment)   Orthostatic   Behavior During Therapy Flat affect;Anxious             Past Medical History:  Diagnosis Date   Anal fissure    Anemia    in early 20's   Anxiety    Arthritis    back   Breast calcifications on mammogram    Cancer ([place])    pre cancer colon,cervix   Colon polyp    Dyslipidemia 06/24/2018   Elevated cholesterol    Facet arthropathy, lumbosacral 08/28/2017   Family history of adverse reaction to anesthesia    sibling has N/V   GERD (gastroesophageal reflux disease)    Heart murmur    History of colon polyps    History of kidney stones    has small ones   IBS (irritable bowel syndrome)    Inappropriate sinus tachycardia    Migraines    Osteopenia    Osteoporosis of femur without pathological fracture 08/28/2017   Palpitations    PONV (postoperative nausea and vomiting)    Premature surgical menopause    PTSD (post-traumatic stress disorder)    Sleep apnea    Past Surgical History:  Procedure Laterality Date   ANAL FISSURE REPAIR     APPENDECTOMY  2000   COLONOSCOPY W/ POLYPECTOMY  03/2015   COLONOSCOPY WITH ESOPHAGOGASTRODUODENOSCOPY (EGD)  04/01/2015   KNEE SURGERY     OOPHORECTOMY     OOPHORECTOMY     1995, 1998   ORIF ANKLE FRACTURE  Right 11/28/2021   Procedure: OPEN REDUCTION INTERNAL FIXATION (ORIF) ANKLE FRACTURE;  Surgeon: Meredith Pel, MD;  Location: [facility];  Service: Orthopedics;  Laterality: Right;   PELVIC LAPAROSCOPY     x 4 for endometriosis    VAGINAL HYSTERECTOMY  1994   Patient Active Problem List   Diagnosis Date Noted   Acute right MCA stroke ([place]) 12/07/2021   GERD without esophagitis 12/07/2021   Right wrist fracture, with routine healing, subsequent encounter 12/07/2021   Bimalleolar ankle fracture, right, closed, with routine healing, subsequent encounter    Blepharospasm 11/08/2021   Sleep disorder, circadian, shift work type 08/01/2021   Excessive daytime sleepiness 08/01/2021   Morning headache 02/16/2021   Left eye pain 02/16/2021   Intractable episodic cluster headache 02/16/2021   Behavioral insomnia of childhood 02/16/2021   Retrognathia 02/16/2021   Chronic migraine without aura, with intractable migraine, so stated, with status migrainosus 01/12/2021   Muscle spasm 10/14/2020   Vitamin D deficiency 08/15/2020   Acute bilateral low back pain with bilateral sciatica 07/13/2020   Gastric polyp 02/11/2019   Sessile colonic polyp 02/11/2019   Chronic fatigue 02/11/2019   Grade I internal hemorrhoids  02/11/2019   Ductal hyperplasia of breast 08/21/2018   Cognitive changes 06/24/2018   Dyslipidemia 06/24/2018   Dense breast tissue on mammogram 04/17/2018   Fibrocystic breast determined by biopsy 04/09/2018   Family history of breast cancer in mother 04/09/2018   Paroxysmal sinus tachycardia ([place]) 03/27/2018   Osteoporosis 08/28/2017   Facet arthropathy, lumbosacral 08/28/2017   Chronic left SI joint pain 08/27/2017   Left lumbar radiculopathy 08/27/2017   Allergic rhinitis 08/09/2017   Disorder of lipoid metabolism 08/09/2017   Insomnia secondary to anxiety 08/09/2017   Postmenopausal osteoporosis 08/09/2017   History of hysterectomy for benign disease 08/09/2017   GAD  (generalized anxiety disorder) 08/09/2017   Former smoker, stopped smoking in distant past 08/09/2017   Minor depressive disorder 08/09/2017   Palpitations 03/16/2015   Family history of colon cancer 03/02/2015   Hiatal hernia with GERD without esophagitis 03/02/2015   History of adenomatous polyp of colon 03/02/2015   Chronic migraine without aura 10/10/2014   History of cardiovascular disorder 01/18/2003    REFERRING DIAG: R53.1 (ICD-10-CM) - Left-sided weakness R41.4 (ICD-10-CM) - Left-sided neglect I63.411 (ICD-10-CM) - Cerebrovascular accident (CVA) due to embolism of right middle cerebral artery ([facility]) I63.9,I77.70 (ICD-10-CM) - Cerebral ischemic stroke due to arterial dissection (H[facility])   THERAPY DIAG:  Muscle weakness (generalized)  Unsteadiness on feet  Dizziness and giddiness  PERTINENT HISTORY: SVT, migraine headaches, PTSD, anxiety, irritable bowel syndrome, GERD, osteoporosis, sleep apnea, left sciatica.   PRECAUTIONS: Fall, L hemi, NWB RLE and R wrist, okay to do ROM of R ankle   SUBJECTIVE: Reports sitting up more during day and has not experienced any dizzy spells, not checking BP at home. Has ortho appointment Wednesday.   PAIN:  Are you having pain? Yes NPRS scale: 4/10 Pain location: R ankle Pain orientation: Right  PAIN TYPE: aching and sharp Pain description: intermittent  Aggravating factors: None  Relieving factors: none    TODAY'S TREATMENT:  Pt arrived to session in [facility], med boot on RLE and reported lightheadedness/sweating immediately upon entrance to therapy gym. BP 73/49 mmHg in seated position. Pt requested to lie down on mat and performed lateral scoot to mat on R side w/mod A +2 due to poor adherence to weightbearing restrictions on RUE and safety. Sit <>sidelying on R side w/S* and after 5 minutes, pt returned to seated position and BP reassessed, 81/69 mmHg and pt asymptomatic. Removed med boot w/total A and pt performed 15 ankle pumps, noted  decreased ROM of DF> PF but no pain reported with activity. Donned med boot w/total A.   Transfers/Gait training  -With R platform RW, pt performed sit <>stand w/min A x2 and mod multimodal cues for hand placement, noted poor adherence to WB restrictions, requiring multimodal cues for safety.  -Pt ambulated 12' w/R PFRW and min guard and close WC follow using hop-to pattern, min verbal cues for increased step clearance. Noted HS contracture of LLE and tendency towards TDWB of RLE. Pt then reported lightheadedness and required min A x2 for stand <>sit for eccentric control and adherence to WB restrictions. BP at 72/42 mmHg, taken electronically and manually, pt symptomatic.   Pt instructed to perform bilateral LAQ for increased blood flow while seated, x15 per side. BP at 80/54 mmHg at end of session, pt asymptomatic.   Handoff to OT at end of session.    PATIENT EDUCATION: Education details: Importance of maintaining ROM/stretching, HEP, adhering to WB restrictions   Person educated: Patient Education method: Explanation and  Handouts Education comprehension: verbalized understanding and needs further education   HOME EXERCISE PROGRAM: Access Code: [email or web address] URL: [email or web address] Date: 01/01/2022 Prepared by: Mickie Bail Sharetha Newson  Exercises Seated Hamstring Stretch with Strap - 1 x daily - 7 x weekly - 3 sets - 4 reps - 30s hold Seated Ankle Pumps - 1 x daily - 7 x weekly - 3 sets - 10 reps    PT Short Term Goals - 12/14/21 1344       PT SHORT TERM GOAL #1   Title Pt will be independent and compliant with initial HEP in order to improve functional mobility.  (Target Date: 01/13/22)    Time 4    Period Weeks    Status New    Target Date 01/13/22      PT SHORT TERM GOAL #2   Title Pt will perform lateral scoot transfers both directions at min A level maintaining NWB precautions without cues.    Time 4    Period Weeks    Status New      PT SHORT TERM GOAL #3    Title Will assess and address any vertigo that may be present in order to dec dizziness.    Time 4    Period Weeks    Status New      PT SHORT TERM GOAL #4   Title Pt will perform sit<>stand at min A level maintaining NWB precautions.    Time 4    Period Weeks    Status New      PT SHORT TERM GOAL #5   Title Pt will report being out of bed in sitting position at least 4 hours of the day in order to improve upright tolerance.    Time 4    Period Weeks    Status New              PT Long Term Goals - 12/14/21 1347       PT LONG TERM GOAL #1   Title Pt will be IND with final HEP in order to indicate dec fall risk and improved functional mobility.  (Target Date: 02/12/22 (will update new LTGs to 03/14/22))    Time 8    Period Weeks    Status New    Target Date 02/12/22      PT LONG TERM GOAL #2   Title Pt will perform lateral scoot transfers at S level in order to indicate improved functional mobility.    Time 4    Period Weeks    Status New      PT LONG TERM GOAL #3   Title Pt will perform sit<>stand at S level to LRAD in order to indicate improved functional mobility.    Time 8    Period Weeks    Status New      PT LONG TERM GOAL #4   Title Pt will tolerate up to 5 mins of standing and report being out of bed at least 7 hours per day in order to improve upright tolerance.    Time 8    Period Weeks    Status New      PT LONG TERM GOAL #5   Title Will add gait goals when clearned to begin weight bearing.    Time 8    Period Weeks    Status New              Plan - 01/01/22 1024     Clinical Impression Statement Pt with very  flat affect and anxiety throughout session. Upon entrance to therapy gym, pt reported being sweaty and requested to lie down on mat, BP at 73/49 mmHg. Pt required +2 assistance for lateral scoot transfer to R side from [facility] to mat due to poor adherence to WB restrictions and safety. Pt reports her orthostatic BP is "psychosomatic" in nature  and "only occurs during therapy", although pt is not checking BP at home. Pt requires max encouragement to participate and does not adhere to weightbearing restrictions. Pt is ambulating up to 12' w/R PFRW and min guard slowly, needs continued practice for hop-to gait pattern. Continue POC.    Personal Factors and Comorbidities Comorbidity 3+;Profession    Comorbidities see above    Examination-Activity Limitations Bathing;Bed Mobility;Dressing;Locomotion Level;Sit;Squat;Stairs;Stand;Transfers    Examination-Participation Restrictions Community Activity;Driving;Occupation;Meal Prep;Shop;Laundry    Stability/Clinical Decision Making Unstable/Unpredictable    Rehab Potential Good    PT Frequency 2x / week    PT Duration 12 weeks    PT Treatment/Interventions ADLs/Self Care Home Management;Aquatic Therapy;DME Instruction;Electrical Stimulation;Gait training;Stair training;Functional mobility training;Therapeutic activities;Therapeutic exercise;Balance training;Neuromuscular re-education;Cognitive remediation;Patient/family education;Manual techniques;Dry needling;Vestibular;Passive range of motion    PT Next Visit Plan Any updates from ortho? Update HEP, gait training w/R PFRW, transfers practice, BLE strength    PT Home Exercise Plan [email or web address]    Consulted and Agree with Plan of Care Patient               Cruzita Lederer Romario Tith, PT, DPT 01/01/2022, 12:25 PM

## 2022-01-03 ENCOUNTER — Other Ambulatory Visit: Payer: Self-pay

## 2022-01-03 ENCOUNTER — Encounter: Payer: Self-pay | Admitting: Orthopedic Surgery

## 2022-01-03 ENCOUNTER — Ambulatory Visit (INDEPENDENT_AMBULATORY_CARE_PROVIDER_SITE_OTHER): Payer: No Typology Code available for payment source | Admitting: Surgical

## 2022-01-03 ENCOUNTER — Ambulatory Visit (INDEPENDENT_AMBULATORY_CARE_PROVIDER_SITE_OTHER): Payer: No Typology Code available for payment source

## 2022-01-03 DIAGNOSIS — S82891A Other fracture of right lower leg, initial encounter for closed fracture: Secondary | ICD-10-CM

## 2022-01-03 DIAGNOSIS — S62184A Nondisplaced fracture of trapezoid [smaller multangular], right wrist, initial encounter for closed fracture: Secondary | ICD-10-CM

## 2022-01-03 NOTE — Progress Notes (Signed)
? ?Post-Op Visit Note ?  ?Patient: Alice Williams           ?Date of Birth: [birth year]           ?MRN: [id number] ?Visit Date: 01/03/2022 ?PCP: Mackie Pai, PA-C ? ? ?Assessment & Plan: ? ?Chief Complaint:  ?Chief Complaint  ?Patient presents with  ? Right Ankle - Follow-up  ? ?Visit Diagnoses:  ?1. Closed nondisplaced fracture of trapezoid of right wrist, initial encounter   ?2. Closed right ankle fracture, initial encounter   ? ? ?Plan: Patient is a 61 year old female who presents s/p right ankle ORIF on 12/06/2021.  She is currently nonweightbearing on the right lower extremity.  Also nonweightbearing on the right upper extremity with nondisplaced wrist fracture.  Still using removable splint for the wrist.  She is working with physical therapy and Occupational Therapy following her recent stroke.  Taking muscle relaxer and over-the-counter Tylenol for pain control.  She denies any fevers, chills, night sweats, drainage from the incisions, chest pain, shortness of breath, calf pain.  She is on Plavix. ? ?On exam, she has no tenderness to palpation over the lateral or medial malleoli.  Incisions are well-healed without evidence of infection or dehiscence.  No calf tenderness.  Negative Homans' sign.  She has about 5 degrees of ankle dorsiflexion with the knee flexed.  Intact ankle dorsiflexion, plantarflexion. ? ?Right ankle radiographs show hardware in good position with no significant change in position since last radiographs.  With no significant tenderness on exam and radiographs that appear appropriate, plan for initiation of weightbearing.  She will weight-bear as tolerated with a walker in the fracture boot and follow-up with the office in 2 weeks for clinical recheck with new radiographs at that time to make sure there is no displacement at the fracture site with weightbearing.  As long as radiographs appear appropriate in 2 weeks, transition to regular shoe at that point.  Prescription for a walker was  given to her today.  Recommended she use a walker due to her not having walked in a while and her left-sided weakness that is worse since her stroke. ? ?Follow-Up Instructions: No follow-ups on file.  ? ?Orders:  ?Orders Placed This Encounter  ?Procedures  ? XR Ankle Complete Right  ? ?No orders of the defined types were placed in this encounter. ? ? ?Imaging: ?No results found. ? ?PMFS History: ?Patient Active Problem List  ? Diagnosis Date Noted  ? Acute right MCA stroke ([place]) 12/07/2021  ? GERD without esophagitis 12/07/2021  ? Right wrist fracture, with routine healing, subsequent encounter 12/07/2021  ? Bimalleolar ankle fracture, right, closed, with routine healing, subsequent encounter   ? Blepharospasm 11/08/2021  ? Sleep disorder, circadian, shift work type 08/01/2021  ? Excessive daytime sleepiness 08/01/2021  ? Morning headache 02/16/2021  ? Left eye pain 02/16/2021  ? Intractable episodic cluster headache 02/16/2021  ? Behavioral insomnia of childhood 02/16/2021  ? Retrognathia 02/16/2021  ? Chronic migraine without aura, with intractable migraine, so stated, with status migrainosus 01/12/2021  ? Muscle spasm 10/14/2020  ? Vitamin D deficiency 08/15/2020  ? Acute bilateral low back pain with bilateral sciatica 07/13/2020  ? Gastric polyp 02/11/2019  ? Sessile colonic polyp 02/11/2019  ? Chronic fatigue 02/11/2019  ? Grade I internal hemorrhoids 02/11/2019  ? Ductal hyperplasia of breast 08/21/2018  ? Cognitive changes 06/24/2018  ? Dyslipidemia 06/24/2018  ? Dense breast tissue on mammogram 04/17/2018  ? Fibrocystic breast determined by biopsy 04/09/2018  ?  Family history of breast cancer in mother 04/09/2018  ? Paroxysmal sinus tachycardia ([place]) 03/27/2018  ? Osteoporosis 08/28/2017  ? Facet arthropathy, lumbosacral 08/28/2017  ? Chronic left SI joint pain 08/27/2017  ? Left lumbar radiculopathy 08/27/2017  ? Allergic rhinitis 08/09/2017  ? Disorder of lipoid metabolism 08/09/2017  ? Insomnia secondary  to anxiety 08/09/2017  ? Postmenopausal osteoporosis 08/09/2017  ? History of hysterectomy for benign disease 08/09/2017  ? GAD (generalized anxiety disorder) 08/09/2017  ? Former smoker, stopped smoking in distant past 08/09/2017  ? Minor depressive disorder 08/09/2017  ? Palpitations 03/16/2015  ? Family history of colon cancer 03/02/2015  ? Hiatal hernia with GERD without esophagitis 03/02/2015  ? History of adenomatous polyp of colon 03/02/2015  ? Chronic migraine without aura 10/10/2014  ? History of cardiovascular disorder 01/18/2003  ? ?Past Medical History:  ?Diagnosis Date  ? Anal fissure   ? Anemia   ? in early 20's  ? Anxiety   ? Arthritis   ? back  ? Breast calcifications on mammogram   ? Cancer ([place])   ? pre cancer colon,cervix  ? Colon polyp   ? Dyslipidemia 06/24/2018  ? Elevated cholesterol   ? Facet arthropathy, lumbosacral 08/28/2017  ? Family history of adverse reaction to anesthesia   ? sibling has N/V  ? GERD (gastroesophageal reflux disease)   ? Heart murmur   ? History of colon polyps   ? History of kidney stones   ? has small ones  ? IBS (irritable bowel syndrome)   ? Inappropriate sinus tachycardia   ? Migraines   ? Osteopenia   ? Osteoporosis of femur without pathological fracture 08/28/2017  ? Palpitations   ? PONV (postoperative nausea and vomiting)   ? Premature surgical menopause   ? PTSD (post-traumatic stress disorder)   ? Sleep apnea   ?  ?Family History  ?Problem Relation Age of Onset  ? Colon cancer Mother   ? Breast cancer Mother 66  ?     and liver  ? Migraines Mother   ? Hyperlipidemia Father   ? Alcohol abuse Father   ? Heart attack Father   ? Cancer Father   ?     Head and neck   ? Hyperlipidemia Brother   ? Lung cancer Maternal Grandmother   ? Parkinson's disease Maternal Grandfather   ? Dementia Maternal Grandfather   ? Prostate cancer Maternal Grandfather   ? Parkinson's disease Paternal Grandmother   ? Heart attack Paternal Grandfather   ? Kidney cancer Sister 71  ?  Migraines Sister   ? Migraines Sister   ? Esophageal cancer Neg Hx   ?  ?Past Surgical History:  ?Procedure Laterality Date  ? ANAL FISSURE REPAIR    ? APPENDECTOMY  2000  ? COLONOSCOPY W/ POLYPECTOMY  03/2015  ? COLONOSCOPY WITH ESOPHAGOGASTRODUODENOSCOPY (EGD)  04/01/2015  ? KNEE SURGERY    ? OOPHORECTOMY    ? OOPHORECTOMY    ? 1995, 1998  ? ORIF ANKLE FRACTURE Right 11/28/2021  ? Procedure: OPEN REDUCTION INTERNAL FIXATION (ORIF) ANKLE FRACTURE;  Surgeon: Meredith Pel, MD;  Location: [facility];  Service: Orthopedics;  Laterality: Right;  ? PELVIC LAPAROSCOPY    ? x 4 for endometriosis   ? VAGINAL HYSTERECTOMY  1994  ? ?Social History  ? ?Occupational History  ? Occupation: Therapist, sports  ?Tobacco Use  ? Smoking status: Former  ?  Types: Cigarettes  ?  Quit date: 08/09/2006  ?  Years since quitting:  15.4  ? Smokeless tobacco: Never  ?Vaping Use  ? Vaping Use: Never used  ?Substance and Sexual Activity  ? Alcohol use: Not Currently  ?  Comment: rare  ? Drug use: No  ? Sexual activity: Never  ?  Birth control/protection: Surgical  ? ? ? ?

## 2022-01-04 NOTE — Telephone Encounter (Signed)
Sent request for access to pt account in [facility] portal.  ?

## 2022-01-05 ENCOUNTER — Ambulatory Visit: Payer: No Typology Code available for payment source | Admitting: Physical Therapy

## 2022-01-05 ENCOUNTER — Telehealth: Payer: Self-pay | Admitting: Occupational Therapy

## 2022-01-05 ENCOUNTER — Ambulatory Visit: Payer: No Typology Code available for payment source | Admitting: Occupational Therapy

## 2022-01-05 ENCOUNTER — Other Ambulatory Visit: Payer: Self-pay

## 2022-01-05 DIAGNOSIS — R2681 Unsteadiness on feet: Secondary | ICD-10-CM

## 2022-01-05 DIAGNOSIS — M25631 Stiffness of right wrist, not elsewhere classified: Secondary | ICD-10-CM

## 2022-01-05 DIAGNOSIS — M6281 Muscle weakness (generalized): Secondary | ICD-10-CM

## 2022-01-05 DIAGNOSIS — I69354 Hemiplegia and hemiparesis following cerebral infarction affecting left non-dominant side: Secondary | ICD-10-CM

## 2022-01-05 NOTE — Patient Instructions (Signed)
. ? ? ?  ROM: Abduction - Wand Sitting  ? ? ?Holding wand with left hand palm up, push wand directly out to side, leading with other hand palm down, until stretch is felt. Hold 5 seconds. ?Repeat 10 times per set. Do 2 sessions per day. ? ? ? ?Cane Overhead - sitting  ? ? ?Sit with arms straight, hold cane forward at waist. Raise cane above head. Hold 3 seconds. ?Repeat 10 times. Do 2 times per day. ? ?SCAPULA: Retraction ? ? ? ?Hold cane with both hands. Pinch shoulder blades together. Do not shrug shoulders. Hold _5__ seconds.  ?10__ reps per set, __2_ sets per day, _7__ days per week ? ?Copyright ? [employer]. All rights reserved.  ? ? ? ?

## 2022-01-05 NOTE — Telephone Encounter (Signed)
Hi Alice Williams, meant to send you a message. Okay for AROM and PROM of right wrist. Okay for strengthening exercises in the brace ?We will dc brace in ~2 weeks as long as she is doing okay on exam at that point

## 2022-01-05 NOTE — Therapy (Signed)
°OUTPATIENT PHYSICAL THERAPY TREATMENT NOTE ° ° °Patient Name: Alice Williams °MRN: [id number] °DOB:12/25/1960, 61 y.o., female °Today's Date: 01/05/2022 ° °PCP: Saguier, Edward, PA-C °REFERRING PROVIDER: Ahern, Antonia B, MD  ° ° PT End of Session - 01/05/22 1054   ° ° Visit Number 3   ° Number of Visits 25   ° Date for PT Re-Evaluation 03/14/22   ° Authorization Type [facility] Needs auth after 12th visit   ° Authorization - Visit Number 1   ° Authorization - Number of Visits 12   ° PT Start Time 1003   ° PT Stop Time 1048   ° PT Time Calculation (min) 45 min   ° Equipment Utilized During Treatment Gait belt   ° Activity Tolerance Treatment limited secondary to medical complications (Comment)   Orthostatic  ° Behavior During Therapy Flat affect;WFL for tasks assessed/performed   ° °  °  ° °  ° ° ° °Past Medical History:  °Diagnosis Date  ° Anal fissure   ° Anemia   ° in early 20's  ° Anxiety   ° Arthritis   ° back  ° Breast calcifications on mammogram   ° Cancer ([facility])   ° pre cancer colon,cervix  ° Colon polyp   ° Dyslipidemia 06/24/2018  ° Elevated cholesterol   ° Facet arthropathy, lumbosacral 08/28/2017  ° Family history of adverse reaction to anesthesia   ° sibling has N/V  ° GERD (gastroesophageal reflux disease)   ° Heart murmur   ° History of colon polyps   ° History of kidney stones   ° has small ones  ° IBS (irritable bowel syndrome)   ° Inappropriate sinus tachycardia   ° Migraines   ° Osteopenia   ° Osteoporosis of femur without pathological fracture 08/28/2017  ° Palpitations   ° PONV (postoperative nausea and vomiting)   ° Premature surgical menopause   ° PTSD (post-traumatic stress disorder)   ° Sleep apnea   ° °Past Surgical History:  °Procedure Laterality Date  ° ANAL FISSURE REPAIR    ° APPENDECTOMY  2000  ° COLONOSCOPY W/ POLYPECTOMY  03/2015  ° COLONOSCOPY WITH ESOPHAGOGASTRODUODENOSCOPY (EGD)  04/01/2015  ° KNEE SURGERY    ° OOPHORECTOMY    ° OOPHORECTOMY    ° 1995, 1998  ° ORIF ANKLE FRACTURE  Right 11/28/2021  ° Procedure: OPEN REDUCTION INTERNAL FIXATION (ORIF) ANKLE FRACTURE;  Surgeon: Dean, Gregory Scott, MD;  Location: [facility];  Service: Orthopedics;  Laterality: Right;  ° PELVIC LAPAROSCOPY    ° x 4 for endometriosis   ° VAGINAL HYSTERECTOMY  1994  ° °Patient Active Problem List  ° Diagnosis Date Noted  ° Acute right MCA stroke ([facility]) 12/07/2021  ° GERD without esophagitis 12/07/2021  ° Right wrist fracture, with routine healing, subsequent encounter 12/07/2021  ° Bimalleolar ankle fracture, right, closed, with routine healing, subsequent encounter   ° Blepharospasm 11/08/2021  ° Sleep disorder, circadian, shift work type 08/01/2021  ° Excessive daytime sleepiness 08/01/2021  ° Morning headache 02/16/2021  ° Left eye pain 02/16/2021  ° Intractable episodic cluster headache 02/16/2021  ° Behavioral insomnia of childhood 02/16/2021  ° Retrognathia 02/16/2021  ° Chronic migraine without aura, with intractable migraine, so stated, with status migrainosus 01/12/2021  ° Muscle spasm 10/14/2020  ° Vitamin D deficiency 08/15/2020  ° Acute bilateral low back pain with bilateral sciatica 07/13/2020  ° Gastric polyp 02/11/2019  ° Sessile colonic polyp 02/11/2019  ° Chronic fatigue 02/11/2019  ° Grade I internal   hemorrhoids 02/11/2019  ° Ductal hyperplasia of breast 08/21/2018  ° Cognitive changes 06/24/2018  ° Dyslipidemia 06/24/2018  ° Dense breast tissue on mammogram 04/17/2018  ° Fibrocystic breast determined by biopsy 04/09/2018  ° Family history of breast cancer in mother 04/09/2018  ° Paroxysmal sinus tachycardia ([facility]) 03/27/2018  ° Osteoporosis 08/28/2017  ° Facet arthropathy, lumbosacral 08/28/2017  ° Chronic left SI joint pain 08/27/2017  ° Left lumbar radiculopathy 08/27/2017  ° Allergic rhinitis 08/09/2017  ° Disorder of lipoid metabolism 08/09/2017  ° Insomnia secondary to anxiety 08/09/2017  ° Postmenopausal osteoporosis 08/09/2017  ° History of hysterectomy for benign disease 08/09/2017  ° GAD  (generalized anxiety disorder) 08/09/2017  ° Former smoker, stopped smoking in distant past 08/09/2017  ° Minor depressive disorder 08/09/2017  ° Palpitations 03/16/2015  ° Family history of colon cancer 03/02/2015  ° Hiatal hernia with GERD without esophagitis 03/02/2015  ° History of adenomatous polyp of colon 03/02/2015  ° Chronic migraine without aura 10/10/2014  ° History of cardiovascular disorder 01/18/2003  ° ° °REFERRING DIAG: R53.1 (ICD-10-CM) - Left-sided weakness R41.4 (ICD-10-CM) - Left-sided neglect I63.411 (ICD-10-CM) - Cerebrovascular accident (CVA) due to embolism of right middle cerebral artery (HCC) I63.9,I77.70 (ICD-10-CM) - Cerebral ischemic stroke due to arterial dissection (HCC)  ° °THERAPY DIAG:  °Muscle weakness (generalized) ° °Unsteadiness on feet ° °Hemiplegia and hemiparesis following cerebral infarction affecting left non-dominant side (HCC) ° °PERTINENT HISTORY: SVT, migraine headaches, PTSD, anxiety, irritable bowel syndrome, GERD, osteoporosis, sleep apnea, left sciatica.  ° °PRECAUTIONS: Fall, L hemi, WBAT RLE and R wrist, okay to do ROM of R ankle  ° °SUBJECTIVE: Pt reports orthopedic appointment went well, cleared for WBAT of R hemibody. Has order for RW but "was waiting for this session" prior to obtaining one. Pt reports doing her exercises at home and those are going well.  ° °PAIN:  °Are you having pain? No ° °VITALS-BP all taken in LUE ° -Pre-session taken seated: 90/59 mmHg, asymptomatic °-After SciFit taken seated: 80/55 mmHg, asymptomatic °-After first set of standing marches: 68/41 mmHg, symptomatic °-Prior to second set of marches in sitting: 81/56 mmHg, asymptomatic  °-After second set of standing marches: 77/49 mmHg, symptomatic (Of note, BP machine gave error code on first 2 attempts to gather BP, so actual BP likely lower following marches)   ° °TODAY'S TREATMENT:  °Transfers/Gait training  °-With RW, pt performed sit <>stand pivot from WC to SciFit w/CGA slowly.  Noted hesitancy to bear weight through RLE, minimal pain reported with weightbearing which pt described as "stretching". Pt began feeling lightheaded prior to sitting on SciFit, provided min verbal ces for diaphragmatic breathing and assisted pt on moving BLEs on bike to improve HRR.  °-Sit <>stands throughout session from WC to RW w/CGA, min verbal cues to bear weight through RLE and hand placement.  ° ° °Ther Ex  °-SciFit level 1 for 6 minutes w/BLE/BUE for dynamic cardiovascular warmup, endurance and global strengthening. Min verbal cues to maintain steps >60 and full ROM of BLEs.  ° °Standing marches w/BUE support on RW and CGA for improved lateral weight shifting, cardiovascular conditioning and BLE strength. Pt performed 2x30s prior to becoming symptomatic, CGA throughout  ° ° ° °PATIENT EDUCATION: °Education details: Obtaining RW and BP machine to monitor BP at home, wearing compression socks for improved HR response in standing and performing ankle pumps/marches/LAQ when feeling symptomatic to assist w/cardiovascular response. Encouraged pt to keep log of her BP in sitting/standing and length of time she   can stay standing prior to becoming symptomatic.   °Person educated: Patient °Education method: Explanation and Handouts °Education comprehension: verbalized understanding and needs further education ° ° °HOME EXERCISE PROGRAM: °Access Code: [email or web address] °URL: [email or web address] °Date: 01/05/2022 °Prepared by: Jannah Plaster ° °Exercises °Seated Hamstring Stretch with Strap - 1 x daily - 7 x weekly - 3 sets - 4 reps - 30s hold °Seated Ankle Pumps - 1 x daily - 7 x weekly - 3 sets - 10 reps °Standing March with Counter Support - 1 x daily - 7 x weekly - 3 sets - 10 reps ° ° ° PT Short Term Goals - 12/14/21 1344   ° °  ° PT SHORT TERM GOAL #1  ° Title Pt will be independent and compliant with initial HEP in order to improve functional mobility.  (Target Date: 01/13/22)   ° Time 4   ° Period Weeks    ° Status New   ° Target Date 01/13/22   °  ° PT SHORT TERM GOAL #2  ° Title Pt will perform lateral scoot transfers both directions at min A level maintaining NWB precautions without cues.   ° Time 4   ° Period Weeks   ° Status New   °  ° PT SHORT TERM GOAL #3  ° Title Will assess and address any vertigo that may be present in order to dec dizziness.   ° Time 4   ° Period Weeks   ° Status New   °  ° PT SHORT TERM GOAL #4  ° Title Pt will perform sit<>stand at min A level maintaining NWB precautions.   ° Time 4   ° Period Weeks   ° Status New   °  ° PT SHORT TERM GOAL #5  ° Title Pt will report being out of bed in sitting position at least 4 hours of the day in order to improve upright tolerance.   ° Time 4   ° Period Weeks   ° Status New   ° °  °  ° °  ° ° ° PT Long Term Goals - 12/14/21 1347   ° °  ° PT LONG TERM GOAL #1  ° Title Pt will be IND with final HEP in order to indicate dec fall risk and improved functional mobility.  (Target Date: 02/12/22 (will update new LTGs to 03/14/22))   ° Time 8   ° Period Weeks   ° Status New   ° Target Date 02/12/22   °  ° PT LONG TERM GOAL #2  ° Title Pt will perform lateral scoot transfers at S level in order to indicate improved functional mobility.   ° Time 4   ° Period Weeks   ° Status New   °  ° PT LONG TERM GOAL #3  ° Title Pt will perform sit<>stand at S level to LRAD in order to indicate improved functional mobility.   ° Time 8   ° Period Weeks   ° Status New   °  ° PT LONG TERM GOAL #4  ° Title Pt will tolerate up to 5 mins of standing and report being out of bed at least 7 hours per day in order to improve upright tolerance.   ° Time 8   ° Period Weeks   ° Status New   °  ° PT LONG TERM GOAL #5  ° Title Will add gait goals when clearned to begin weight bearing.   ° Time 8   °   Period Weeks    Status New              Plan - 01/05/22 1056     Clinical Impression Statement Emphasis of skilled PT session on weightbearing tolerance of RLE, cardiovascular  conditioning for improved heart rate response and transfers. Pt cleared to WBAT of RUE/RLE and reports she has not been practicing standing at home. Heavy education regarding wearing compression socks at home, monitoring BP and keeping BP journal to keep track of orthostatic episodes and standing tolerance prior to becoming symptomatic. Pt able to bear weight through RLE well with minimal pain and it most limited by orthostatic BP. Continue POC.    Personal Factors and Comorbidities Comorbidity 3+;Profession    Comorbidities see above    Examination-Activity Limitations Bathing;Bed Mobility;Dressing;Locomotion Level;Sit;Squat;Stairs;Stand;Transfers    Examination-Participation Restrictions Community Activity;Driving;Occupation;Meal Prep;Shop;Laundry    Stability/Clinical Decision Making Unstable/Unpredictable    Rehab Potential Good    PT Frequency 2x / week    PT Duration 12 weeks    PT Treatment/Interventions ADLs/Self Care Home Management;Aquatic Therapy;DME Instruction;Electrical Stimulation;Gait training;Stair training;Functional mobility training;Therapeutic activities;Therapeutic exercise;Balance training;Neuromuscular re-education;Cognitive remediation;Patient/family education;Manual techniques;Dry needling;Vestibular;Passive range of motion    PT Next Visit Plan Did pt bring RW? Update/review HEP, standing tolerance, gait training w/RW, BLE strength    PT Home Exercise Plan [email or web address]    Consulted and Agree with Plan of Care Patient                Cruzita Lederer Harmoney Sienkiewicz, PT, DPT 01/05/2022, 10:59 AM

## 2022-01-05 NOTE — Telephone Encounter (Signed)
Dr. Blase Mess,  ?Annelise Mccoy is receiving occupational therapy at our site s/p CVA and wrist fx.. ? ?When may we begin A/ROM to right wrist and forearm? ? ?When will she be cleared for P/ROM and strengthening ?  ? ?Best regards, ?Curt Bears Latese Dufault, OTR/L ?

## 2022-01-05 NOTE — Therapy (Addendum)
[facility] [address] [place], [place], [address] Phone: [phone]   Fax:  [phone]  Occupational Therapy Treatment  Patient Details  Name: Alice Williams MRN: [id number] Date of Birth: 03/05/1961 Referring Provider (OT): Dr. Naomie Dean   Encounter Date: 01/05/2022   OT End of Session - 01/05/22 1057     Visit Number 3    Number of Visits 25    Date for OT Re-Evaluation 03/25/22    Authorization Type Focus 2023  copay $40 per visit  No Ded , VL: [place]  Auth Reqd: after 12th visit    Authorization - Visit Number 3    Authorization - Number of Visits 12    OT Start Time 1050    OT Stop Time 1130    OT Time Calculation (min) 40 min    Activity Tolerance Patient tolerated treatment well    Behavior During Therapy [facility] for tasks assessed/performed             Past Medical History:  Diagnosis Date   Anal fissure    Anemia    in early 20's   Anxiety    Arthritis    back   Breast calcifications on mammogram    Cancer (HCC)    pre cancer colon,cervix   Colon polyp    Dyslipidemia 06/24/2018   Elevated cholesterol    Facet arthropathy, lumbosacral 08/28/2017   Family history of adverse reaction to anesthesia    sibling has N/V   GERD (gastroesophageal reflux disease)    Heart murmur    History of colon polyps    History of kidney stones    has small ones   IBS (irritable bowel syndrome)    Inappropriate sinus tachycardia    Migraines    Osteopenia    Osteoporosis of femur without pathological fracture 08/28/2017   Palpitations    PONV (postoperative nausea and vomiting)    Premature surgical menopause    PTSD (post-traumatic stress disorder)    Sleep apnea     Past Surgical History:  Procedure Laterality Date   ANAL FISSURE REPAIR     APPENDECTOMY  2000   COLONOSCOPY W/ POLYPECTOMY  03/2015   COLONOSCOPY WITH ESOPHAGOGASTRODUODENOSCOPY (EGD)  04/01/2015   KNEE SURGERY     OOPHORECTOMY      OOPHORECTOMY     1995, 1998   ORIF ANKLE FRACTURE Right 11/28/2021   Procedure: OPEN REDUCTION INTERNAL FIXATION (ORIF) ANKLE FRACTURE;  Surgeon: Cammy Copa, MD;  Location: [facility] OR;  Service: Orthopedics;  Laterality: Right;   PELVIC LAPAROSCOPY     x 4 for endometriosis    VAGINAL HYSTERECTOMY  1994    There were no vitals filed for this visit.   Subjective Assessment - 01/05/22 1056     Subjective  "a little pain in the ankle but it's gotten better"    Limitations 01/05/22- received after therapy session was completed - per MD-Okay for AROM and PROM of right wrist. Okay for strengthening exercises in the brace  We will dc brace in ~2 weeks as long as she is doing okay on exam at that point, (pt is cleared for walker use and WB through RLE also)    Currently in Pain? Yes    Pain Score 2     Pain Location Ankle    Pain Orientation Right    Pain Descriptors / Indicators Aching    Pain Type Acute pain    Pain Onset More than  a month ago    Pain Frequency Intermittent    Aggravating Factors  weightbearing    Pain Relieving Factors rest                      Treatment: Stand pivot transfer to mat with walker, pt has been cleared by MD to WB though RLE and use walker, still waiting on clearance for wrist ROM. Supine closed chain shoulder flexion and chest press, progressed to seated dowel exercises see pt instructions Functional reaching in seated with LUE to place and remove graded clothespins 1-8# no adverse reactions, Pt fatigues quickly.           OT Education - 01/05/22 1143     Education Details Seated closed chain exercises with dowel, theraband exercises placed on hold at this time as they pull in pt's shoulder,after attempting with therapist   Person(s) Educated Patient    Methods Explanation;Handout;Verbal cues;Demonstration    Comprehension Verbalized understanding;Returned demonstration;Verbal cues required              OT Short Term  Goals - 01/01/22 1217       OT SHORT TERM GOAL #1   Title Pt will be independent with initial HEP for strength/activity tolerance.--check STGs 01/23/22    Time 4    Period Weeks    Status On-going      OT SHORT TERM GOAL #2   Title Pt will be independent with initial HEP for R wrist ROM (once cleared by MD).    Time 4    Period Weeks    Status On-going      OT SHORT TERM GOAL #3   Title Pt will perform BADLs mod I.    Time 4    Period Weeks    Status New      OT SHORT TERM GOAL #4   Title Pt will perform simple home maintenance/snack prep with supervision.    Time 4    Period Weeks    Status New               OT Long Term Goals - 12/25/21 1234       OT LONG TERM GOAL #1   Title Pt will be independent with updated strengthening HEP for bilateral UEs/R wrist (once cleared by MD).--check LTGs 03/25/22    Time 12    Period Weeks    Status New      OT LONG TERM GOAL #2   Title Pt will perform simple clooking and cleaning tasks mod I.    Time 12    Period Weeks    Status New      OT LONG TERM GOAL #3   Title Pt will be able to complete IADL task for at least prior to rest break.    Time 12    Period Weeks    Status New      OT LONG TERM GOAL #4   Title Pt will demo at least 35lbs R grip strength for lifting/carrying objects and opening containers.    Time 12    Period Weeks    Status New      OT LONG TERM GOAL #5   Title Pt will demo at least 60* wrist flex/ext for ADLs/IADLs.    Time 12    Period Weeks    Status New                   Plan - 01/05/22 1057  Clinical Impression Statement Pt reports pt is having orthostatic BP today when standing. Pt has been cleared for WBing through RLE and walker use. Therapist has not received clearance yet from MD to proceed with A/ROM to wrist . (Pt reports that MD said he was going to send information)    OT Occupational Profile and History Detailed Assessment- Review of Records and additional  review of physical, cognitive, psychosocial history related to current functional performance    Occupational performance deficits (Please refer to evaluation for details): ADL's;IADL's;Work;Leisure    Body Structure / Function / Physical Skills ADL;Decreased knowledge of use of DME;Strength;Balance;UE functional use;ROM;IADL;Endurance;Mobility    Rehab Potential Good    Clinical Decision Making Several treatment options, min-mod task modification necessary    Comorbidities Affecting Occupational Performance: May have comorbidities impacting occupational performance    Modification or Assistance to Complete Evaluation  No modification of tasks or assist necessary to complete eval    OT Frequency 2x / week    OT Duration 12 weeks   +eval, may modify based on scheduling needs/when precautions are lifted   OT Treatment/Interventions Self-care/ADL training;Moist Heat;Fluidtherapy;DME and/or AE instruction;Splinting;Therapeutic activities;Balance training;Contrast Bath;Ultrasound;Therapeutic exercise;Passive range of motion;Functional Mobility Training;Neuromuscular education;Cryotherapy;Electrical Stimulation;Paraffin;Manual Therapy;Patient/family education;Energy conservation    Plan clearance received for wrist ROM- after session was completed,  issue A/ROM next session, Review seated dowel exercises, consider arm bike    Consulted and Agree with Plan of Care Patient    Family Member Consulted --             Patient will benefit from skilled therapeutic intervention in order to improve the following deficits and impairments:   Body Structure / Function / Physical Skills: ADL, Decreased knowledge of use of DME, Strength, Balance, UE functional use, ROM, IADL, Endurance, Mobility       Visit Diagnosis: Muscle weakness (generalized)  Unsteadiness on feet  Hemiplegia and hemiparesis following cerebral infarction affecting left non-dominant side (HCC)  Stiffness of right wrist, not  elsewhere classified    Problem List Patient Active Problem List   Diagnosis Date Noted   Acute right MCA stroke (HCC) 12/07/2021   GERD without esophagitis 12/07/2021   Right wrist fracture, with routine healing, subsequent encounter 12/07/2021   Bimalleolar ankle fracture, right, closed, with routine healing, subsequent encounter    Blepharospasm 11/08/2021   Sleep disorder, circadian, shift work type 08/01/2021   Excessive daytime sleepiness 08/01/2021   Morning headache 02/16/2021   Left eye pain 02/16/2021   Intractable episodic cluster headache 02/16/2021   Behavioral insomnia of childhood 02/16/2021   Retrognathia 02/16/2021   Chronic migraine without aura, with intractable migraine, so stated, with status migrainosus 01/12/2021   Muscle spasm 10/14/2020   Vitamin D deficiency 08/15/2020   Acute bilateral low back pain with bilateral sciatica 07/13/2020   Gastric polyp 02/11/2019   Sessile colonic polyp 02/11/2019   Chronic fatigue 02/11/2019   Grade I internal hemorrhoids 02/11/2019   Ductal hyperplasia of breast 08/21/2018   Cognitive changes 06/24/2018   Dyslipidemia 06/24/2018   Dense breast tissue on mammogram 04/17/2018   Fibrocystic breast determined by biopsy 04/09/2018   Family history of breast cancer in mother 04/09/2018   Paroxysmal sinus tachycardia (HCC) 03/27/2018   Osteoporosis 08/28/2017   Facet arthropathy, lumbosacral 08/28/2017   Chronic left SI joint pain 08/27/2017   Left lumbar radiculopathy 08/27/2017   Allergic rhinitis 08/09/2017   Disorder of lipoid metabolism 08/09/2017   Insomnia secondary to anxiety 08/09/2017   Postmenopausal osteoporosis 08/09/2017  History of hysterectomy for benign disease 08/09/2017   GAD (generalized anxiety disorder) 08/09/2017   Former smoker, stopped smoking in distant past 08/09/2017   Minor depressive disorder 08/09/2017   Palpitations 03/16/2015   Family history of colon cancer 03/02/2015   Hiatal  hernia with GERD without esophagitis 03/02/2015   History of adenomatous polyp of colon 03/02/2015   Chronic migraine without aura 10/10/2014   History of cardiovascular disorder 01/18/2003    Modest Draeger, OT 01/05/2022, 12:43 PM  [facility] [address] [place], [place], [address] Phone: [phone]   Fax:  [phone]  Name: Alice Williams MRN: [id number] Date of Birth: [birth year]

## 2022-01-08 ENCOUNTER — Other Ambulatory Visit (HOSPITAL_COMMUNITY): Payer: Self-pay

## 2022-01-08 ENCOUNTER — Other Ambulatory Visit: Payer: Self-pay

## 2022-01-08 ENCOUNTER — Other Ambulatory Visit: Payer: Self-pay | Admitting: Neurology

## 2022-01-09 ENCOUNTER — Ambulatory Visit: Payer: No Typology Code available for payment source

## 2022-01-09 ENCOUNTER — Ambulatory Visit: Payer: No Typology Code available for payment source | Admitting: Physical Therapy

## 2022-01-09 ENCOUNTER — Ambulatory Visit: Payer: No Typology Code available for payment source | Admitting: Occupational Therapy

## 2022-01-09 ENCOUNTER — Other Ambulatory Visit: Payer: Self-pay

## 2022-01-09 ENCOUNTER — Other Ambulatory Visit (HOSPITAL_COMMUNITY): Payer: Self-pay

## 2022-01-09 DIAGNOSIS — R2681 Unsteadiness on feet: Secondary | ICD-10-CM

## 2022-01-09 DIAGNOSIS — I69354 Hemiplegia and hemiparesis following cerebral infarction affecting left non-dominant side: Secondary | ICD-10-CM

## 2022-01-09 DIAGNOSIS — M6281 Muscle weakness (generalized): Secondary | ICD-10-CM

## 2022-01-09 DIAGNOSIS — M25631 Stiffness of right wrist, not elsewhere classified: Secondary | ICD-10-CM

## 2022-01-09 DIAGNOSIS — R471 Dysarthria and anarthria: Secondary | ICD-10-CM

## 2022-01-09 NOTE — Telephone Encounter (Addendum)
Copay enrollment complete ? ? ?

## 2022-01-09 NOTE — Therapy (Signed)
?OUTPATIENT PHYSICAL THERAPY TREATMENT NOTE ? ? ?Patient Name: Alice Williams ?MRN: [id number] ?DOB:1961/10/24, 61 y.o., female ?Today's Date: 01/09/2022 ? ?PCP: Mackie Pai, PA-C ?REFERRING PROVIDER: Melvenia Beam, MD  ? ? PT End of Session - 01/09/22 1020   ? ? Visit Number 4   ? Number of Visits 25   ? Date for PT Re-Evaluation 03/14/22   ? Authorization Type Focus ([facility]) Needs auth after 12th visit   ? Authorization - Visit Number 1   ? Authorization - Number of Visits 12   ? PT Start Time 1018   ? PT Stop Time 1100   ? PT Time Calculation (min) 42 min   ? Equipment Utilized During Treatment Gait belt   ? Activity Tolerance Treatment limited secondary to medical complications (Comment)   Orthostatic  ? Behavior During Therapy [facility] for tasks assessed/performed   ? ?  ?  ? ?  ? ? ? ? ?Past Medical History:  ?Diagnosis Date  ? Anal fissure   ? Anemia   ? in early 20's  ? Anxiety   ? Arthritis   ? back  ? Breast calcifications on mammogram   ? Cancer ([place])   ? pre cancer colon,cervix  ? Colon polyp   ? Dyslipidemia 06/24/2018  ? Elevated cholesterol   ? Facet arthropathy, lumbosacral 08/28/2017  ? Family history of adverse reaction to anesthesia   ? sibling has N/V  ? GERD (gastroesophageal reflux disease)   ? Heart murmur   ? History of colon polyps   ? History of kidney stones   ? has small ones  ? IBS (irritable bowel syndrome)   ? Inappropriate sinus tachycardia   ? Migraines   ? Osteopenia   ? Osteoporosis of femur without pathological fracture 08/28/2017  ? Palpitations   ? PONV (postoperative nausea and vomiting)   ? Premature surgical menopause   ? PTSD (post-traumatic stress disorder)   ? Sleep apnea   ? ?Past Surgical History:  ?Procedure Laterality Date  ? ANAL FISSURE REPAIR    ? APPENDECTOMY  2000  ? COLONOSCOPY W/ POLYPECTOMY  03/2015  ? COLONOSCOPY WITH ESOPHAGOGASTRODUODENOSCOPY (EGD)  04/01/2015  ? KNEE SURGERY    ? OOPHORECTOMY    ? OOPHORECTOMY    ? 1995, 1998  ? ORIF ANKLE FRACTURE Right  11/28/2021  ? Procedure: OPEN REDUCTION INTERNAL FIXATION (ORIF) ANKLE FRACTURE;  Surgeon: Meredith Pel, MD;  Location: [facility];  Service: Orthopedics;  Laterality: Right;  ? PELVIC LAPAROSCOPY    ? x 4 for endometriosis   ? VAGINAL HYSTERECTOMY  1994  ? ?Patient Active Problem List  ? Diagnosis Date Noted  ? Acute right MCA stroke ([place]) 12/07/2021  ? GERD without esophagitis 12/07/2021  ? Right wrist fracture, with routine healing, subsequent encounter 12/07/2021  ? Bimalleolar ankle fracture, right, closed, with routine healing, subsequent encounter   ? Blepharospasm 11/08/2021  ? Sleep disorder, circadian, shift work type 08/01/2021  ? Excessive daytime sleepiness 08/01/2021  ? Morning headache 02/16/2021  ? Left eye pain 02/16/2021  ? Intractable episodic cluster headache 02/16/2021  ? Behavioral insomnia of childhood 02/16/2021  ? Retrognathia 02/16/2021  ? Chronic migraine without aura, with intractable migraine, so stated, with status migrainosus 01/12/2021  ? Muscle spasm 10/14/2020  ? Vitamin D deficiency 08/15/2020  ? Acute bilateral low back pain with bilateral sciatica 07/13/2020  ? Gastric polyp 02/11/2019  ? Sessile colonic polyp 02/11/2019  ? Chronic fatigue 02/11/2019  ? Grade I internal  hemorrhoids 02/11/2019  ? Ductal hyperplasia of breast 08/21/2018  ? Cognitive changes 06/24/2018  ? Dyslipidemia 06/24/2018  ? Dense breast tissue on mammogram 04/17/2018  ? Fibrocystic breast determined by biopsy 04/09/2018  ? Family history of breast cancer in mother 04/09/2018  ? Paroxysmal sinus tachycardia ([place]) 03/27/2018  ? Osteoporosis 08/28/2017  ? Facet arthropathy, lumbosacral 08/28/2017  ? Chronic left SI joint pain 08/27/2017  ? Left lumbar radiculopathy 08/27/2017  ? Allergic rhinitis 08/09/2017  ? Disorder of lipoid metabolism 08/09/2017  ? Insomnia secondary to anxiety 08/09/2017  ? Postmenopausal osteoporosis 08/09/2017  ? History of hysterectomy for benign disease 08/09/2017  ? GAD (generalized  anxiety disorder) 08/09/2017  ? Former smoker, stopped smoking in distant past 08/09/2017  ? Minor depressive disorder 08/09/2017  ? Palpitations 03/16/2015  ? Family history of colon cancer 03/02/2015  ? Hiatal hernia with GERD without esophagitis 03/02/2015  ? History of adenomatous polyp of colon 03/02/2015  ? Chronic migraine without aura 10/10/2014  ? History of cardiovascular disorder 01/18/2003  ? ? ?REFERRING DIAG: R53.1 (ICD-10-CM) - Left-sided weakness R41.4 (ICD-10-CM) - Left-sided neglect I63.411 (ICD-10-CM) - Cerebrovascular accident (CVA) due to embolism of right middle cerebral artery (HCC) I63.9,I77.70 (ICD-10-CM) - Cerebral ischemic stroke due to arterial dissection (HCC)  ? ?THERAPY DIAG:  ?Muscle weakness (generalized) ? ?Unsteadiness on feet ? ?Hemiplegia and hemiparesis following cerebral infarction affecting left non-dominant side ([place]) ? ?PERTINENT HISTORY: SVT, migraine headaches, PTSD, anxiety, irritable bowel syndrome, GERD, osteoporosis, sleep apnea, left sciatica.  ? ?PRECAUTIONS: Fall, L hemi, WBAT RLE and R wrist, okay to do ROM of R ankle  ? ?SUBJECTIVE: "I threw up all day Sunday, I could not keep anything down". Pt reports it started off with phlegm and then progressed to emesis and diarrhea. Pt has been wearing compression socks at home and reports her BP has stayed around 90/60 mmHg but unable to obtain reading in standing w/BP machine due to error code, still experiencing orthostatics.  ? ?PAIN:  ?Are you having pain? No ? ?VITALS-BP all taken in LUE manually ? -Pre-session taken seated: 110/12mHg, asymptomatic ?-After first set of seated marches: 108/66 mmHg, asymptomatic  ?-After first walk: 112/68 mmHg, asymptomatic  ? ? ?TODAY'S TREATMENT:  ?  Pt wore compression sock on LLE throughout session  ? ?Ther Ex   ?Seated alt. marches for improved cardiovascular conditioning and BLE strength as well as priming for walking, x2 minutes.  ? ?Gait training  ?Gait pattern: step to  pattern, decreased step length- Left, decreased stance time- Right, decreased hip/knee flexion- Left, decreased ankle dorsiflexion- Left, shuffling, and poor foot clearance- Left ?Distance walked: 176, 626 and 635 ?Assistive device utilized: WEnvironmental consultant- 2 wheeled ?Level of assistance: SBA ?Comments: Pt did not report symptoms of orthostasis throughout session, so BP not assessed following initial gait trial. Pt ambulating with decreased cadence and over reliance on BUEs on RW. Noted poor step clearance of LLE due to decreased knee flexion. Mod verbal cues for knee flexion of LLE and to "march forward" to promote step-through gait pattern and pt began to initiate step-through pattern on final gait trial with improved step clearance of LLE.  ?  ? ?PATIENT EDUCATION: ?Education details: Obtaining RW, asking Dr. SHarvie Heckabout wearing compression sock on RLE, safety w/ambulation at home  ?Person educated: Patient ?Education method: Explanation and Handouts ?Education comprehension: verbalized understanding and needs further education ? ? ?HOME EXERCISE PROGRAM: ?Access Code: [email or web address]?URL: [email or web address] ?Date: 01/05/2022 ?Prepared by: JMickie BailPlaster ? ?Exercises ?Seated  Hamstring Stretch with Strap - 1 x daily - 7 x weekly - 3 sets - 4 reps - 30s hold ?Seated Ankle Pumps - 1 x daily - 7 x weekly - 3 sets - 10 reps ?Standing March with Counter Support - 1 x daily - 7 x weekly - 3 sets - 10 reps ? ? ? PT Short Term Goals - 01/09/22 1210   ? ?  ? PT SHORT TERM GOAL #1  ? Title Pt will be independent and compliant with initial HEP in order to improve functional mobility.  (Target Date: 01/13/22)   ? Time 4   ? Period Weeks   ? Status Achieved   ? Target Date 01/13/22   ?  ? PT SHORT TERM GOAL #2  ? Title Pt will perform lateral scoot transfers both directions at min A level maintaining NWB precautions without cues.   ? Time 4   ? Period Weeks   ? Status Achieved   ?  ? PT SHORT TERM GOAL #3  ? Title Will  assess and address any vertigo that may be present in order to dec dizziness.   ? Time 4   ? Period Weeks   ? Status Achieved   ?  ? PT SHORT TERM GOAL #4  ? Title Pt will perform sit<>stand at min A level maintai

## 2022-01-09 NOTE — Therapy (Signed)
[facility], [place], [address] ?Phone: [phone]   Fax:  [phone] ? ?Speech Language Pathology Treatment ? ?Patient Details  ?Name: Alice Williams ?MRN: [id number] ?Date of Birth: [birth year] ?Referring Provider (SLP): Dr. Rosalin Hawking ? ? ?Encounter Date: 01/09/2022 ? ? End of Session - 01/09/22 1152   ? ? Visit Number 3   ? Number of Visits 25   ? Date for SLP Re-Evaluation 03/23/22   ? Authorization Type needs auth after 12 visits combined - will need to monitor OT/PT visits   ? SLP Start Time 1148   ? SLP Stop Time  1230   ? SLP Time Calculation (min) 42 min   ? Activity Tolerance Patient tolerated treatment well   ? ?  ?  ? ?  ? ? ?Past Medical History:  ?Diagnosis Date  ? Anal fissure   ? Anemia   ? in early 20's  ? Anxiety   ? Arthritis   ? back  ? Breast calcifications on mammogram   ? Cancer ([place])   ? pre cancer colon,cervix  ? Colon polyp   ? Dyslipidemia 06/24/2018  ? Elevated cholesterol   ? Facet arthropathy, lumbosacral 08/28/2017  ? Family history of adverse reaction to anesthesia   ? sibling has N/V  ? GERD (gastroesophageal reflux disease)   ? Heart murmur   ? History of colon polyps   ? History of kidney stones   ? has small ones  ? IBS (irritable bowel syndrome)   ? Inappropriate sinus tachycardia   ? Migraines   ? Osteopenia   ? Osteoporosis of femur without pathological fracture 08/28/2017  ? Palpitations   ? PONV (postoperative nausea and vomiting)   ? Premature surgical menopause   ? PTSD (post-traumatic stress disorder)   ? Sleep apnea   ? ? ?Past Surgical History:  ?Procedure Laterality Date  ? ANAL FISSURE REPAIR    ? APPENDECTOMY  2000  ? COLONOSCOPY W/ POLYPECTOMY  03/2015  ? COLONOSCOPY WITH ESOPHAGOGASTRODUODENOSCOPY (EGD)  04/01/2015  ? KNEE SURGERY    ? OOPHORECTOMY    ? OOPHORECTOMY    ? 1995, 1998  ? ORIF ANKLE FRACTURE Right 11/28/2021  ? Procedure: OPEN REDUCTION INTERNAL FIXATION (ORIF) ANKLE  FRACTURE;  Surgeon: Meredith Pel, MD;  Location: [facility];  Service: Orthopedics;  Laterality: Right;  ? PELVIC LAPAROSCOPY    ? x 4 for endometriosis   ? VAGINAL HYSTERECTOMY  1994  ? ? ?There were no vitals filed for this visit. ? ? Subjective Assessment - 01/09/22 1150   ? ? Subjective "I had a rough weekend"   ? Currently in Pain? No/denies   ? ?  ?  ? ?  ? ? ? ? ? ? ? ? ADULT SLP TREATMENT - 01/09/22 1151   ? ?  ? General Information  ? Behavior/Cognition Alert;Cooperative;Pleasant mood   ?  ? Treatment Provided  ? Treatment provided Cognitive-Linquistic   ?  ? Cognitive-Linquistic Treatment  ? Treatment focused on Dysarthria;Patient/family/caregiver education   ? Skilled Treatment Pt enters with rough vocal quality not exhibited last week. N/V reported over weekend which could be causing irritation/inflammation. SLP educated and instructed SOVTE and flow phonation to addresss rough vocal quality given presentation of strain/roughness in conversation and during PHoRTE exercises. Occasional min A required to complete exercises. Improved vocal quality exhibited after SOVTE and flow phonation in addition to cued projection. HEP provided.   ?  ?  Assessment / Recommendations / Plan  ? Plan Continue with current plan of care   ?  ? Progression Toward Goals  ? Progression toward goals Progressing toward goals   ? ?  ?  ? ?  ? ? ? SLP Education - 01/09/22 1246   ? ? Education Details SOVTE, flow phonation, HEP   ? Person(s) Educated Patient   ? Methods Explanation;Demonstration;Verbal cues;Handout   ? Comprehension Verbalized understanding;Returned demonstration;Need further instruction   ? ?  ?  ? ?  ? ? ? SLP Short Term Goals - 01/09/22 1249   ? ?  ? SLP SHORT TERM GOAL #1  ? Title Pt will complete loud AH with average of 85dB over 2 sessions with rare min A   ? Baseline 01-01-22   ? Time 3   ? Period Weeks   ? Status On-going   ?  ? SLP SHORT TERM GOAL #2  ? Title Pt will complete HEP for voice/dysarthria (PHoRTE)  with rare min A over 2 sessions   ? Time 3   ? Period Weeks   ? Status On-going   ?  ? SLP SHORT TERM GOAL #3  ? Title Pt will average 72dB 18/20 sentences in structured speech tasks   ? Baseline 01-01-22   ? Time 3   ? Period Weeks   ? Status On-going   ?  ? SLP SHORT TERM GOAL #4  ? Title Pt will maintain average of 70dB and clear phonation over 8 minute conversation with occasional min A   ? Time 3   ? Period Weeks   ? Status On-going   ? ?  ?  ? ?  ? ? ? SLP Long Term Goals - 01/09/22 1249   ? ?  ? SLP LONG TERM GOAL #1  ? Title Pt will complete HEP for dysarthria with mod I   ? Time 11   ? Period Weeks   ? Status On-going   ?  ? SLP LONG TERM GOAL #2  ? Title Pt will self correct low, hoarse voice with rare min A as needed over 2 sessions   ? Time 11   ? Period Weeks   ? Status On-going   ?  ? SLP LONG TERM GOAL #3  ? Title Pt will average 70dB over 15 minute conversation with rare min A   ? Time 11   ? Period Weeks   ? Status On-going   ?  ? SLP LONG TERM GOAL #4  ? Title Pt will be 100% intelligible in noisy environment with rare min A over 15 minute conversation   ? Time 11   ? Period Weeks   ? Status On-going   ?  ? SLP LONG TERM GOAL #5  ? Title Pt will improve score on Voice Related Quality of Life Measure (VRQOL) raw score by 2 points   ? Baseline raw score - 20; calculated score - 75   ? Time 11   ? Period Weeks   ? Status On-going   ? ?  ?  ? ?  ? ? ? Plan - 01/09/22 1247   ? ? Clinical Impression Statement Kelis Plasse is referred by neurology for cognition and speech s/p CVA. Today, Bunny presents with mild hypokinetic dysarthria with low volume and usual roughness/hoarseness not exhibited last session. SLP educated and instructed SOVT and flow phonation exercises to optimize vocal quality and reduce tension/roughness, with good benefit. Inconsistent carryover of forward focused  phonation and intentional voicing exhibited in conversation. Pt would benefit from skilled ST intervention to optimize  speech intelligilbility and communication effectiveness.   ? Speech Therapy Frequency 2x / week   ? Duration 12 weeks   ? Treatment/Interventions SLP instruction and feedback;Compensatory strategies;Functional tasks;Patient/family education;Multimodal communcation approach;Internal/external aids;Environmental controls   ? Potential to Achieve Goals Good   ? ?  ?  ? ?  ? ? ?Patient will benefit from skilled therapeutic intervention in order to improve the following deficits and impairments:   ?Dysarthria and anarthria ? ? ? ?Problem List ?Patient Active Problem List  ? Diagnosis Date Noted  ? Acute right MCA stroke ([place]) 12/07/2021  ? GERD without esophagitis 12/07/2021  ? Right wrist fracture, with routine healing, subsequent encounter 12/07/2021  ? Bimalleolar ankle fracture, right, closed, with routine healing, subsequent encounter   ? Blepharospasm 11/08/2021  ? Sleep disorder, circadian, shift work type 08/01/2021  ? Excessive daytime sleepiness 08/01/2021  ? Morning headache 02/16/2021  ? Left eye pain 02/16/2021  ? Intractable episodic cluster headache 02/16/2021  ? Behavioral insomnia of childhood 02/16/2021  ? Retrognathia 02/16/2021  ? Chronic migraine without aura, with intractable migraine, so stated, with status migrainosus 01/12/2021  ? Muscle spasm 10/14/2020  ? Vitamin D deficiency 08/15/2020  ? Acute bilateral low back pain with bilateral sciatica 07/13/2020  ? Gastric polyp 02/11/2019  ? Sessile colonic polyp 02/11/2019  ? Chronic fatigue 02/11/2019  ? Grade I internal hemorrhoids 02/11/2019  ? Ductal hyperplasia of breast 08/21/2018  ? Cognitive changes 06/24/2018  ? Dyslipidemia 06/24/2018  ? Dense breast tissue on mammogram 04/17/2018  ? Fibrocystic breast determined by biopsy 04/09/2018  ? Family history of breast cancer in mother 04/09/2018  ? Paroxysmal sinus tachycardia ([place]) 03/27/2018  ? Osteoporosis 08/28/2017  ? Facet arthropathy, lumbosacral 08/28/2017  ? Chronic left SI joint pain  08/27/2017  ? Left lumbar radiculopathy 08/27/2017  ? Allergic rhinitis 08/09/2017  ? Disorder of lipoid metabolism 08/09/2017  ? Insomnia secondary to anxiety 08/09/2017  ? Postmenopausal osteoporosis 08/09/2017

## 2022-01-09 NOTE — Patient Instructions (Signed)
?  AROM: Wrist Extension ? ? ?.  ?With _Rt___ palm down, bend wrist up. ?Repeat __10__ times per set.  Do __4-6__ sessions per day. ? ? ?AROM: Wrist Flexion ? ? ?With__Rt___ palm up, bend wrist up. ?Repeat __10__ times per set.  Do _4-6___ sessions per day. ? ? ? ?AROM: Wrist Radial / Ulnar Deviation ? ? ? ?Gently bend right wrist from side to side as far as possible. ?Repeat __10__ times per set. Do _4-6___ sessions per day. ? ? ? ?AROM: Forearm Pronation / Supination ? ? ?With _Rt___ arm in handshake position, elbow bent at 90*, slowly rotate palm down until stretch is felt. Relax. Then rotate palm up until stretch is felt. ?Repeat _10___ times per set. Do _4-6___ sessions per day. ? ? ? ?  ? ?

## 2022-01-09 NOTE — Therapy (Signed)
[facility] ?[place] ?[place], [place], [address] ?Phone: [phone]   Fax:  [phone] ? ?Occupational Therapy Treatment ? ?Patient Details  ?Name: Alice Williams ?MRN: [id number] ?Date of Birth: [birth year] ?Referring Provider (OT): Dr. Sarina Ill ? ? ?Encounter Date: 01/09/2022 ? ? OT End of Session - 01/09/22 1138   ? ? Visit Number 4   ? Number of Visits 25   ? Date for OT Re-Evaluation 03/25/22   ? Authorization Type [employer] 2023  copay $40 per visit  No Ded , VL: [place]  Auth Reqd: after 12th visit   ? Authorization - Visit Number 3   ? Authorization - Number of Visits 12   ? OT Start Time 1100   ? OT Stop Time 1140   ? OT Time Calculation (min) 40 min   ? Activity Tolerance Patient tolerated treatment well   ? Behavior During Therapy [facility] for tasks assessed/performed   ? ?  ?  ? ?  ? ? ?Past Medical History:  ?Diagnosis Date  ? Anal fissure   ? Anemia   ? in early 20's  ? Anxiety   ? Arthritis   ? back  ? Breast calcifications on mammogram   ? Cancer ([place])   ? pre cancer colon,cervix  ? Colon polyp   ? Dyslipidemia 06/24/2018  ? Elevated cholesterol   ? Facet arthropathy, lumbosacral 08/28/2017  ? Family history of adverse reaction to anesthesia   ? sibling has N/V  ? GERD (gastroesophageal reflux disease)   ? Heart murmur   ? History of colon polyps   ? History of kidney stones   ? has small ones  ? IBS (irritable bowel syndrome)   ? Inappropriate sinus tachycardia   ? Migraines   ? Osteopenia   ? Osteoporosis of femur without pathological fracture 08/28/2017  ? Palpitations   ? PONV (postoperative nausea and vomiting)   ? Premature surgical menopause   ? PTSD (post-traumatic stress disorder)   ? Sleep apnea   ? ? ?Past Surgical History:  ?Procedure Laterality Date  ? ANAL FISSURE REPAIR    ? APPENDECTOMY  2000  ? COLONOSCOPY W/ POLYPECTOMY  03/2015  ? COLONOSCOPY WITH ESOPHAGOGASTRODUODENOSCOPY (EGD)  04/01/2015  ? KNEE SURGERY    ? OOPHORECTOMY     ? OOPHORECTOMY    ? 1995, 1998  ? ORIF ANKLE FRACTURE Right 11/28/2021  ? Procedure: OPEN REDUCTION INTERNAL FIXATION (ORIF) ANKLE FRACTURE;  Surgeon: Meredith Pel, MD;  Location: [facility];  Service: Orthopedics;  Laterality: Right;  ? PELVIC LAPAROSCOPY    ? x 4 for endometriosis   ? VAGINAL HYSTERECTOMY  1994  ? ? ?There were no vitals filed for this visit. ? ? Subjective Assessment - 01/09/22 1105   ? ? Subjective  no pain   ? Limitations 01/05/22- received after therapy session was completed - per MD-Okay for AROM and PROM of right wrist. Okay for strengthening exercises in the brace  We will dc brace in ~2 weeks as long as she is doing okay on exam at that point, (pt is cleared for walker use and WB through RLE also)   ? Patient Stated Goals get back to where I was before (return to living alone and go back to work)   ? Currently in Pain? No/denies   ? Pain Onset More than a month ago   ? ?  ?  ? ?  ? ? ? ? ? [facility] OT  Assessment - 01/09/22 0001   ? ?  ? AROM  ? AROM Assessment Site Forearm;Wrist   ? Right/Left Forearm Right   ? Right Forearm Pronation --   WFL's  ? Right Forearm Supination --   WFL's  ? Right/Left Wrist Right   ? Right Wrist Extension 52 Degrees   ? Right Wrist Flexion 55 Degrees   ? Right Wrist Radial Deviation 15 Degrees   ? Right Wrist Ulnar Deviation 38 Degrees   ? ?  ?  ? ?  ? ? ? ? ? ? ? ? ? ? ? OT Treatments/Exercises (OP) - 01/09/22 0001   ? ?  ? ADLs  ? ADL Comments Pt is cleared for A/ROM and P/ROM to Rt wrist. Assessed A/ROM forearm and wrist - see above measurements   ?  ? Exercises  ? Exercises Wrist;Shoulder   ?  ? Shoulder Exercises: ROM/Strengthening  ? UBE (Upper Arm Bike) UBE x 8 min for UE endurance, level 1 (w/ wrist brace on)   ?  ? Wrist Exercises  ? Other wrist exercises Pt issued A/ROM HEP - see pt instructions for details.   ? ?  ?  ? ?  ? ? ? ? ? ? ? ? ? OT Education - 01/09/22 1144   ? ? Education Details A/ROM HEP for wrist and forearm   ? Person(s) Educated  Patient   ? Methods Explanation;Handout;Verbal cues;Demonstration   ? Comprehension Verbalized understanding;Returned demonstration;Verbal cues required   ? ?  ?  ? ?  ? ? ? OT Short Term Goals - 01/01/22 1217   ? ?  ? OT SHORT TERM GOAL #1  ? Title Pt will be independent with initial HEP for strength/activity tolerance.--check STGs 01/23/22   ? Time 4   ? Period Weeks   ? Status On-going   ?  ? OT SHORT TERM GOAL #2  ? Title Pt will be independent with initial HEP for R wrist ROM (once cleared by MD).   ? Time 4   ? Period Weeks   ? Status On-going   ?  ? OT SHORT TERM GOAL #3  ? Title Pt will perform BADLs mod I.   ? Time 4   ? Period Weeks   ? Status New   ?  ? OT SHORT TERM GOAL #4  ? Title Pt will perform simple home maintenance/snack prep with supervision.   ? Time 4   ? Period Weeks   ? Status New   ? ?  ?  ? ?  ? ? ? ? OT Long Term Goals - 12/25/21 1234   ? ?  ? OT LONG TERM GOAL #1  ? Title Pt will be independent with updated strengthening HEP for bilateral UEs/R wrist (once cleared by MD).--check LTGs 03/25/22   ? Time 12   ? Period Weeks   ? Status New   ?  ? OT LONG TERM GOAL #2  ? Title Pt will perform simple clooking and cleaning tasks mod I.   ? Time 12   ? Period Weeks   ? Status New   ?  ? OT LONG TERM GOAL #3  ? Title Pt will be able to complete IADL task for at least 90mn prior to rest break.   ? Time 12   ? Period Weeks   ? Status New   ?  ? OT LONG TERM GOAL #4  ? Title Pt will demo at least 35lbs R  grip strength for lifting/carrying objects and opening containers.   ? Time 12   ? Period Weeks   ? Status New   ?  ? OT LONG TERM GOAL #5  ? Title Pt will demo at least 60* wrist flex/ext for ADLs/IADLs.   ? Time 12   ? Period Weeks   ? Status New   ? ?  ?  ? ?  ? ? ? ? ? ? ? ? Plan - 01/09/22 1139   ? ? Clinical Impression Statement Pt progressing with wrist ROM. Do not anticipate any limitations with wrist as pt's ROM is almost WNL's at this time   ? OT Occupational Profile and History Detailed  Assessment- Review of Records and additional review of physical, cognitive, psychosocial history related to current functional performance   ? Occupational performance deficits (Please refer to evaluation for details): ADL's;IADL's;Work;Leisure   ? Body Structure / Function / Physical Skills ADL;Decreased knowledge of use of DME;Strength;Balance;UE functional use;ROM;IADL;Endurance;Mobility   ? Rehab Potential Good   ? Clinical Decision Making Several treatment options, min-mod task modification necessary   ? Comorbidities Affecting Occupational Performance: May have comorbidities impacting occupational performance   ? Modification or Assistance to Complete Evaluation  No modification of tasks or assist necessary to complete eval   ? OT Frequency 2x / week   ? OT Duration 12 weeks   +eval, may modify based on scheduling needs/when precautions are lifted  ? OT Treatment/Interventions Self-care/ADL training;Moist Heat;Fluidtherapy;DME and/or AE instruction;Splinting;Therapeutic activities;Balance training;Contrast Bath;Ultrasound;Therapeutic exercise;Passive range of motion;Functional Mobility Training;Neuromuscular education;Cryotherapy;Electrical Stimulation;Paraffin;Manual Therapy;Patient/family education;Energy conservation   ? Plan add P/ROM to wrist if needed, consider dynamic standing/light IADL tasks, continue UBE   ? Consulted and Agree with Plan of Care Patient   ? ?  ?  ? ?  ? ? ?Patient will benefit from skilled therapeutic intervention in order to improve the following deficits and impairments:   ?Body Structure / Function / Physical Skills: ADL, Decreased knowledge of use of DME, Strength, Balance, UE functional use, ROM, IADL, Endurance, Mobility ?  ?  ? ? ?Visit Diagnosis: ?Stiffness of right wrist, not elsewhere classified ? ?Hemiplegia and hemiparesis following cerebral infarction affecting left non-dominant side ([place]) ? ? ? ?Problem List ?Patient Active Problem List  ? Diagnosis Date Noted  ? Acute  right MCA stroke ([place]) 12/07/2021  ? GERD without esophagitis 12/07/2021  ? Right wrist fracture, with routine healing, subsequent encounter 12/07/2021  ? Bimalleolar ankle fracture, right, closed, with routin

## 2022-01-09 NOTE — Telephone Encounter (Signed)
Wait and see what Dr Jaynee Eagles says. Pt was given this in the hospital with Aspirin. Pt asked Dr Jaynee Eagles if she needed to continue it. Waiting on answer.  ?

## 2022-01-09 NOTE — Telephone Encounter (Signed)
Alice Williams, will need to send RX to pharmacy or have pt bring your EOB from insurance if using copay card.  ? ?Pt ready for scheduling on or after 12/22/21 ? ?Out-of-pocket cost due at time of visit: $50 ?OR ($25 if using [employer] copay card) ? ?Primary: [facility] ?P[employer]ia co-insurance: $50 ?Admin fee co-insurance: $50  ? ?Secondary: n/a ?Prolia co-insurance:  ?Admin fee co-insurance:  ? ?Deductible: does not apply ? ?Prior Auth: APPROVED ?PA# [id number] ?Valid: 12/22/21-12/21/22 ?  ? ?** This summary of benefits is an estimation of the patient's out-of-pocket cost. Exact cost may very based on individual plan coverage.  ? ?

## 2022-01-09 NOTE — Patient Instructions (Addendum)
?  Semi-occluded vocal tract exercises ? ?These allow your vocal folds to vibrate without excess tension and promotes high placement of the voice ? ?Use SOVTE as a warm up before prolonged speaking and vocal exercises ? ?Watch Vocal Straw Exercises with Lolita Cram on YouTube: ?[email or web address] ? ?Pitch Glides for 2 minutes ? ?Accents (siren) ? ?Hum the [employer] ? ?A goal would be 2-3 minutes several times a day and prior to vocal exercises ? ?As always, use good belly breathing while completing SOVTE ? ?Flow Phonation ? ?You have tension in your larynx limiting the air flow when you speak, resulting in a hoarse or gravelly voice. We call this "pressed speech" ? ?We use flow phonation to improve proper air flow during speech. Sounds  that promote air flow include: s, z, sh, th, f, v, ch. We call this "flow speech" ? ?After you complete the semi-occluded vocal tract exercises (straw exercises), complete the following twice a day ? ?Use a tissue to focus on airflow: ? ?Whooo 10x ?Shoe 10x ?Collie Siad 10x ? ?Shoe-Fu ?Sue-Pu ?He- She ?Fu-Sue ?Pu-Lu ? ? ?Who are you? ? ?Who is Collie Siad? ? ?Twenty ?Thirty  ?Forty ?Fifty ?Sixty ?Seventy ?Eighty ?Ninety ?Hundred ? ?She sells sea shells ? ?See Sue's shoes ? ?Fifty-Fifty ? ?Hit the hammer ? ?High school hero ? ?Hocus Pocus ? ?To each his own ? ?Zebra's zig zag at the zoo ? ?[employer] chews cheddar cheese ? ?Choosy moms choose Jiff ? ?Fat Deveron Furlong prefers french fries ? ?Vince vowed to vote ? ?Feel the furry fish ? ?She should polish her shoes ? ?Show them the fresh fruit ? ?Teachers eat ripe peaches at the beach ? ?Not now nor never ? ?My mama makes me muffins ? ?No one knows Norman's nickname ? ?See Gay Filler Sleep soundly by the sea ? ? ? ? ? ?

## 2022-01-10 ENCOUNTER — Other Ambulatory Visit: Payer: Self-pay | Admitting: Neurology

## 2022-01-10 ENCOUNTER — Other Ambulatory Visit (HOSPITAL_COMMUNITY): Payer: Self-pay

## 2022-01-10 ENCOUNTER — Other Ambulatory Visit (HOSPITAL_BASED_OUTPATIENT_CLINIC_OR_DEPARTMENT_OTHER): Payer: Self-pay

## 2022-01-10 MED ORDER — CLOPIDOGREL BISULFATE 75 MG PO TABS
75.0000 mg | ORAL_TABLET | Freq: Every day | ORAL | 1 refills | Status: DC
Start: 2022-01-10 — End: 2022-02-28
  Filled 2022-01-10: qty 30, 30d supply, fill #0
  Filled 2022-02-08: qty 30, 30d supply, fill #1

## 2022-01-11 ENCOUNTER — Ambulatory Visit: Payer: No Typology Code available for payment source

## 2022-01-11 ENCOUNTER — Other Ambulatory Visit (HOSPITAL_BASED_OUTPATIENT_CLINIC_OR_DEPARTMENT_OTHER): Payer: Self-pay

## 2022-01-11 ENCOUNTER — Other Ambulatory Visit: Payer: Self-pay

## 2022-01-11 ENCOUNTER — Ambulatory Visit: Payer: No Typology Code available for payment source | Admitting: Occupational Therapy

## 2022-01-11 ENCOUNTER — Ambulatory Visit (INDEPENDENT_AMBULATORY_CARE_PROVIDER_SITE_OTHER): Payer: No Typology Code available for payment source | Admitting: Medical

## 2022-01-11 VITALS — BP 94/60 | HR 72 | Temp 97.9°F | Resp 18 | Ht 67.0 in | Wt 125.0 lb

## 2022-01-11 DIAGNOSIS — M6281 Muscle weakness (generalized): Secondary | ICD-10-CM | POA: Diagnosis not present

## 2022-01-11 DIAGNOSIS — R002 Palpitations: Secondary | ICD-10-CM

## 2022-01-11 DIAGNOSIS — S82841D Displaced bimalleolar fracture of right lower leg, subsequent encounter for closed fracture with routine healing: Secondary | ICD-10-CM | POA: Diagnosis not present

## 2022-01-11 DIAGNOSIS — R471 Dysarthria and anarthria: Secondary | ICD-10-CM

## 2022-01-11 DIAGNOSIS — R110 Nausea: Secondary | ICD-10-CM | POA: Diagnosis not present

## 2022-01-11 DIAGNOSIS — F411 Generalized anxiety disorder: Secondary | ICD-10-CM

## 2022-01-11 DIAGNOSIS — K219 Gastro-esophageal reflux disease without esophagitis: Secondary | ICD-10-CM | POA: Diagnosis not present

## 2022-01-11 LAB — COMPREHENSIVE METABOLIC PANEL
ALT: 14 U/L (ref 0–35)
AST: 19 U/L (ref 0–37)
Albumin: 3.8 g/dL (ref 3.5–5.2)
Alkaline Phosphatase: 72 U/L (ref 39–117)
BUN: 9 mg/dL (ref 6–23)
CO2: 23 mEq/L (ref 19–32)
Calcium: 8.9 mg/dL (ref 8.4–10.5)
Chloride: 107 mEq/L (ref 96–112)
Creatinine, Ser: 0.73 mg/dL (ref 0.40–1.20)
GFR: 89.56 mL/min (ref >60.00)
Glucose, Bld: 103 mg/dL — ABNORMAL HIGH (ref 70–99)
Potassium: 3.4 mEq/L — ABNORMAL LOW (ref 3.5–5.1)
Sodium: 138 mEq/L (ref 135–145)
Total Bilirubin: 0.5 mg/dL (ref 0.2–1.2)
Total Protein: 5.9 g/dL — ABNORMAL LOW (ref 6.0–8.3)

## 2022-01-11 LAB — LIPASE: Lipase: 24 U/L (ref 11.0–59.0)

## 2022-01-11 MED ORDER — FAMOTIDINE 20 MG PO TABS
20.0000 mg | ORAL_TABLET | Freq: Every day | ORAL | 0 refills | Status: DC
Start: 2022-01-11 — End: 2022-07-25
  Filled 2022-01-11: qty 30, 30d supply, fill #0

## 2022-01-11 MED ORDER — ONDANSETRON HCL 4 MG PO TABS
4.0000 mg | ORAL_TABLET | Freq: Three times a day (TID) | ORAL | 0 refills | Status: AC | PRN
  Filled 2022-01-11: qty 20, 7d supply, fill #0

## 2022-01-11 NOTE — Therapy (Signed)
[facility] ?[place], [place], [address] ?Phone: [phone]   Fax:  [phone] ? ?Speech Language Pathology Treatment ? ?Patient Details  ?Name: Alice Williams ?MRN: [id number] ?Date of Birth: May 03, 1961 ?Referring Provider (SLP): Dr. Rosalin Hawking ? ? ?Encounter Date: 01/11/2022 ? ? End of Session - 01/11/22 1149   ? ? Visit Number 4   ? Number of Visits 25   ? Date for SLP Re-Evaluation 03/23/22   ? Authorization Type needs auth after 12 visits combined - will need to monitor OT/PT visits   ? Authorization - Visit Number 9   ? Authorization - Number of Visits 12   ? SLP Start Time 1148   ? SLP Stop Time  1230   ? SLP Time Calculation (min) 42 min   ? Activity Tolerance Patient tolerated treatment well   ? ?  ?  ? ?  ? ? ?Past Medical History:  ?Diagnosis Date  ? Anal fissure   ? Anemia   ? in early 20's  ? Anxiety   ? Arthritis   ? back  ? Breast calcifications on mammogram   ? Cancer ([place])   ? pre cancer colon,cervix  ? Colon polyp   ? Dyslipidemia 06/24/2018  ? Elevated cholesterol   ? Facet arthropathy, lumbosacral 08/28/2017  ? Family history of adverse reaction to anesthesia   ? sibling has N/V  ? GERD (gastroesophageal reflux disease)   ? Heart murmur   ? History of colon polyps   ? History of kidney stones   ? has small ones  ? IBS (irritable bowel syndrome)   ? Inappropriate sinus tachycardia   ? Migraines   ? Osteopenia   ? Osteoporosis of femur without pathological fracture 08/28/2017  ? Palpitations   ? PONV (postoperative nausea and vomiting)   ? Premature surgical menopause   ? PTSD (post-traumatic stress disorder)   ? Sleep apnea   ? ? ?Past Surgical History:  ?Procedure Laterality Date  ? ANAL FISSURE REPAIR    ? APPENDECTOMY  2000  ? COLONOSCOPY W/ POLYPECTOMY  03/2015  ? COLONOSCOPY WITH ESOPHAGOGASTRODUODENOSCOPY (EGD)  04/01/2015  ? KNEE SURGERY    ? OOPHORECTOMY    ? OOPHORECTOMY    ? 1995, 1998  ? ORIF ANKLE FRACTURE Right  11/28/2021  ? Procedure: OPEN REDUCTION INTERNAL FIXATION (ORIF) ANKLE FRACTURE;  Surgeon: Meredith Pel, MD;  Location: [facility];  Service: Orthopedics;  Laterality: Right;  ? PELVIC LAPAROSCOPY    ? x 4 for endometriosis   ? VAGINAL HYSTERECTOMY  1994  ? ? ?There were no vitals filed for this visit. ? ? Subjective Assessment - 01/11/22 1150   ? ? Subjective "I went to the doctor this morning"   ? Currently in Pain? No/denies   ? ?  ?  ? ?  ? ? ? ? ? ? ? ? ADULT SLP TREATMENT - 01/11/22 1149   ? ?  ? General Information  ? Behavior/Cognition Alert;Cooperative;Pleasant mood   ?  ? Treatment Provided  ? Treatment provided Cognitive-Linquistic   ?  ? Cognitive-Linquistic Treatment  ? Treatment focused on Dysarthria;Patient/family/caregiver education   ? Skilled Treatment Mildly improved vocal intensity exhibited today although rough/hoarse quality still present. SLP re-educated SOVT exercises and flow phonation to reduce hoarseness and optimize forward focused phonation. Pt becoming increasingly aware of reduced breath support/impact on speech production and able to self-correct intermittently. Usual fading to occasional mod A required to ID  back focused phonation and optimize breath support/projection/flow to aid vocal quality. SLP recommended increasing frequency of HEP to aid carryover.   ?  ? Assessment / Recommendations / Plan  ? Plan Continue with current plan of care   ?  ? Progression Toward Goals  ? Progression toward goals Progressing toward goals   ? ?  ?  ? ?  ? ? ? SLP Education - 01/11/22 1259   ? ? Education Details forward focused phonation versus back backed phonation, breath support   ? Person(s) Educated Patient   ? Methods Explanation;Demonstration;Verbal cues;Handout   ? Comprehension Verbalized understanding;Returned demonstration;Verbal cues required;Need further instruction   ? ?  ?  ? ?  ? ? ? SLP Short Term Goals - 01/11/22 1302   ? ?  ? SLP SHORT TERM GOAL #1  ? Title Pt will complete loud  AH with average of 85dB over 2 sessions with rare min A   ? Baseline 01-01-22   ? Time 3   ? Period Weeks   ? Status On-going   ?  ? SLP SHORT TERM GOAL #2  ? Title Pt will complete HEP for voice/dysarthria (PHoRTE) with rare min A over 2 sessions   ? Time 3   ? Period Weeks   ? Status On-going   ?  ? SLP SHORT TERM GOAL #3  ? Title Pt will average 72dB 18/20 sentences in structured speech tasks   ? Baseline 01-01-22   ? Time 3   ? Period Weeks   ? Status On-going   ?  ? SLP SHORT TERM GOAL #4  ? Title Pt will maintain average of 70dB and clear phonation over 8 minute conversation with occasional min A   ? Time 3   ? Period Weeks   ? Status On-going   ? ?  ?  ? ?  ? ? ? SLP Long Term Goals - 01/11/22 1302   ? ?  ? SLP LONG TERM GOAL #1  ? Title Pt will complete HEP for dysarthria with mod I   ? Time 11   ? Period Weeks   ? Status On-going   ?  ? SLP LONG TERM GOAL #2  ? Title Pt will self correct low, hoarse voice with rare min A as needed over 2 sessions   ? Time 11   ? Period Weeks   ? Status On-going   ?  ? SLP LONG TERM GOAL #3  ? Title Pt will average 70dB over 15 minute conversation with rare min A   ? Time 11   ? Period Weeks   ? Status On-going   ?  ? SLP LONG TERM GOAL #4  ? Title Pt will be 100% intelligible in noisy environment with rare min A over 15 minute conversation   ? Time 11   ? Period Weeks   ? Status On-going   ?  ? SLP LONG TERM GOAL #5  ? Title Pt will improve score on Voice Related Quality of Life Measure (VRQOL) raw score by 2 points   ? Baseline raw score - 20; calculated score - 75   ? Time 11   ? Period Weeks   ? Status On-going   ? ?  ?  ? ?  ? ? ? Plan - 01/11/22 1300   ? ? Clinical Impression Statement Today, Alice Williams presents with mild hypokinetic dysarthria c/b low volume and usual roughness/hoarseness still present since last session. SLP educated and  instructed SOVTE and flow phonation exercises to optimize vocal quality and reduce tension/roughness, with good intermittent benefit.  Improving awareness and carryover of forward focused phonation and intentional voicing exhibited on structured speech tasks. Pt would benefit from skilled ST intervention to optimize speech intelligilbility and communication effectiveness.   ? Speech Therapy Frequency 2x / week   ? Duration 12 weeks   ? Treatment/Interventions SLP instruction and feedback;Compensatory strategies;Functional tasks;Patient/family education;Multimodal communcation approach;Internal/external aids;Environmental controls   ? Potential to Achieve Goals Good   ? ?  ?  ? ?  ? ? ?Patient will benefit from skilled therapeutic intervention in order to improve the following deficits and impairments:   ?Dysarthria and anarthria ? ? ? ?Problem List ?Patient Active Problem List  ? Diagnosis Date Noted  ? Acute right MCA stroke ([place]) 12/07/2021  ? GERD without esophagitis 12/07/2021  ? Right wrist fracture, with routine healing, subsequent encounter 12/07/2021  ? Bimalleolar ankle fracture, right, closed, with routine healing, subsequent encounter   ? Blepharospasm 11/08/2021  ? Sleep disorder, circadian, shift work type 08/01/2021  ? Excessive daytime sleepiness 08/01/2021  ? Morning headache 02/16/2021  ? Left eye pain 02/16/2021  ? Intractable episodic cluster headache 02/16/2021  ? Behavioral insomnia of childhood 02/16/2021  ? Retrognathia 02/16/2021  ? Chronic migraine without aura, with intractable migraine, so stated, with status migrainosus 01/12/2021  ? Muscle spasm 10/14/2020  ? Vitamin D deficiency 08/15/2020  ? Acute bilateral low back pain with bilateral sciatica 07/13/2020  ? Gastric polyp 02/11/2019  ? Sessile colonic polyp 02/11/2019  ? Chronic fatigue 02/11/2019  ? Grade I internal hemorrhoids 02/11/2019  ? Ductal hyperplasia of breast 08/21/2018  ? Cognitive changes 06/24/2018  ? Dyslipidemia 06/24/2018  ? Dense breast tissue on mammogram 04/17/2018  ? Fibrocystic breast determined by biopsy 04/09/2018  ? Family history of breast  cancer in mother 04/09/2018  ? Paroxysmal sinus tachycardia ([place]) 03/27/2018  ? Osteoporosis 08/28/2017  ? Facet arthropathy, lumbosacral 08/28/2017  ? Chronic left SI joint pain 08/27/2017  ? Left lumbar

## 2022-01-11 NOTE — Progress Notes (Signed)
? ?Subjective:  ? ? Patient ID: Alice Williams, female    DOB: June 11, 1961, 61 y.o.   MRN: [id number] ? ?HPI ? ?Pt in for follow up. ? ?She update me that she is now walking with walker. She had ankle fracture with surgery.  ? ?Also rt wrist fracture. Routinely healing. ? ?GAD- anxiety conrolled with lexapro. ? ?Pt bp typically on low side. Hx of intermittent palpitation in past. Psvt on review. Last palpitation was 2 weeks ago and last 5 minutes. ? ?Pt states bp is usually in 70's. ? ? ?Hyperlipidemia- on lipitor. ? ?Hx of stroke- pt is still on plavix. Pt will see Dr. Jaynee Eagles January 23, 2022. ? ?Last weekend and weekend before had episodes vomiting. One time occures after blt and othe time after larger meal shrmp pasta. No symptoms. Some nausea recently after eating ? ? ? ? ?Review of Systems  ?Constitutional:  Negative for chills, fatigue and fever.  ?Respiratory:  Negative for cough, chest tightness, shortness of breath and wheezing.   ?Cardiovascular:  Negative for chest pain and palpitations.  ?Gastrointestinal:  Positive for nausea. Negative for abdominal distention, abdominal pain and blood in stool.  ?     Vomited during nausea episodes over the past 2 weekends.  Some loose stools around that time but no stool recently.  ?Musculoskeletal:  Negative for back pain, myalgias and neck pain.  ?Psychiatric/Behavioral:  Positive for dysphoric mood. Negative for decreased concentration.   ?     Improved with lexapro.  ? ? ?Past Medical History:  ?Diagnosis Date  ? Anal fissure   ? Anemia   ? in early 20's  ? Anxiety   ? Arthritis   ? back  ? Breast calcifications on mammogram   ? Cancer ([place])   ? pre cancer colon,cervix  ? Colon polyp   ? Dyslipidemia 06/24/2018  ? Elevated cholesterol   ? Facet arthropathy, lumbosacral 08/28/2017  ? Family history of adverse reaction to anesthesia   ? sibling has N/V  ? GERD (gastroesophageal reflux disease)   ? Heart murmur   ? History of colon polyps   ? History of kidney stones   ?  has small ones  ? IBS (irritable bowel syndrome)   ? Inappropriate sinus tachycardia   ? Migraines   ? Osteopenia   ? Osteoporosis of femur without pathological fracture 08/28/2017  ? Palpitations   ? PONV (postoperative nausea and vomiting)   ? Premature surgical menopause   ? PTSD (post-traumatic stress disorder)   ? Sleep apnea   ? ?  ?Social History  ? ?Socioeconomic History  ? Marital status: Divorced  ?  Spouse name: Not on file  ? Number of children: 1  ? Years of education: Not on file  ? Highest education level: Master's degree (e.g., MA, MS, MEng, MEd, MSW, MBA)  ?Occupational History  ? Occupation: Therapist, sports  ?Tobacco Use  ? Smoking status: Former  ?  Types: Cigarettes  ?  Quit date: 08/09/2006  ?  Years since quitting: 15.4  ? Smokeless tobacco: Never  ?Vaping Use  ? Vaping Use: Never used  ?Substance and Sexual Activity  ? Alcohol use: Not Currently  ?  Comment: rare  ? Drug use: No  ? Sexual activity: Never  ?  Birth control/protection: Surgical  ?Other Topics Concern  ? Not on file  ?Social History Narrative  ? Single, livevs alone in a 2 story house. Drinks two sodas a day. Does not exercise regularly.  ? ?  Social Determinants of Health  ? ?Financial Resource Strain: Not on file  ?Food Insecurity: Not on file  ?Transportation Needs: Not on file  ?Physical Activity: Not on file  ?Stress: Not on file  ?Social Connections: Not on file  ?Intimate Partner Violence: Not on file  ? ? ?Past Surgical History:  ?Procedure Laterality Date  ? ANAL FISSURE REPAIR    ? APPENDECTOMY  2000  ? COLONOSCOPY W/ POLYPECTOMY  03/2015  ? COLONOSCOPY WITH ESOPHAGOGASTRODUODENOSCOPY (EGD)  04/01/2015  ? KNEE SURGERY    ? OOPHORECTOMY    ? OOPHORECTOMY    ? 1995, 1998  ? ORIF ANKLE FRACTURE Right 11/28/2021  ? Procedure: OPEN REDUCTION INTERNAL FIXATION (ORIF) ANKLE FRACTURE;  Surgeon: Meredith Pel, MD;  Location: [facility];  Service: Orthopedics;  Laterality: Right;  ? PELVIC LAPAROSCOPY    ? x 4 for endometriosis   ? VAGINAL  HYSTERECTOMY  1994  ? ? ?Family History  ?Problem Relation Age of Onset  ? Colon cancer Mother   ? Breast cancer Mother 81  ?     and liver  ? Migraines Mother   ? Hyperlipidemia Father   ? Alcohol abuse Father   ? Heart attack Father   ? Cancer Father   ?     Head and neck   ? Hyperlipidemia Brother   ? Lung cancer Maternal Grandmother   ? Parkinson's disease Maternal Grandfather   ? Dementia Maternal Grandfather   ? Prostate cancer Maternal Grandfather   ? Parkinson's disease Paternal Grandmother   ? Heart attack Paternal Grandfather   ? Kidney cancer Sister 64  ? Migraines Sister   ? Migraines Sister   ? Esophageal cancer Neg Hx   ? ? ?Allergies  ?Allergen Reactions  ? Sulfa Antibiotics Anaphylaxis  ? Sumatriptan Succinate Anaphylaxis  ? Emgality [Galcanezumab-Gnlm] Nausea Only  ?  GI upset - diarrhea, nausea  ? ? ?Current Outpatient Medications on File Prior to Visit  ?Medication Sig Dispense Refill  ? acetaminophen (TYLENOL) 500 MG tablet Take 500 mg by mouth every 6 (six) hours as needed for mild pain, fever or headache.    ? aspirin (ASPIRIN CHILDRENS) 81 MG chewable tablet Chew 1 tablet (81 mg total) by mouth daily. 90 tablet 0  ? Atogepant 60 MG TABS Take 60 mg by mouth daily. 30 tablet 6  ? BOTOX 200 units SOLR INJECT 200 UNITS AS DIRECTED EVERY 3 (THREE) MONTHS. 1 each 3  ? Botulinum Toxin Type A (BOTOX) 200 units SOLR Inject 200 Units as directed every 3 (three) months. 1 each 1  ? Calcium Carbonate-Vitamin D 600-400 MG-UNIT tablet Take 1 tablet by mouth 2 (two) times daily. 60 tablet 11  ? clopidogrel (PLAVIX) 75 MG tablet Take 1 tablet (75 mg total) by mouth daily. 30 tablet 1  ? Coenzyme Q10 (CO Q 10 PO) Take 1 tablet by mouth daily.    ? escitalopram (LEXAPRO) 10 MG tablet TAKE 1 TABLET BY MOUTH ONCE DAILY (Patient taking differently: Take 10 mg by mouth daily.) 90 tablet 1  ? methocarbamol (ROBAXIN) 500 MG tablet Take 1 tablet (500 mg total) by mouth every 8 (eight) hours as needed. (Patient taking  differently: Take 500 mg by mouth every 8 (eight) hours as needed for muscle spasms.) 30 tablet 0  ? nadolol (CORGARD) 40 MG tablet Take 1 tablet (40 mg total) by mouth daily. 90 tablet 3  ? oxyCODONE-acetaminophen (PERCOCET) 5-325 MG tablet Take 1 tablet by mouth every 4 (  four) hours as needed for severe pain. 30 tablet 0  ? pantoprazole (PROTONIX) 40 MG tablet TAKE 1 TABLET BY MOUTH ONCE DAILY 30 tablet 6  ? polycarbophil (FIBERCON) 625 MG tablet Take 625 mg by mouth daily.    ? Riboflavin 400 MG CAPS Take 400 mg by mouth daily.    ? Topiramate ER (TROKENDI XR) 200 MG CP24 Take 1 capsule (200 mg) by mouth daily. 90 capsule 3  ? atorvastatin (LIPITOR) 40 MG tablet Take 1 tablet (40 mg total) by mouth daily. 30 tablet 0  ? traZODone (DESYREL) 50 MG tablet TAKE 1/2 - 1 TABLET BY MOUTH EVERY NIGHT AT BEDTIME AS NEEDED FOR SLEEP (Patient not taking: Reported on 12/07/2021) 30 tablet 3  ? ?No current facility-administered medications on file prior to visit.  ? ? ?BP 93/66   Pulse 72   Temp 97.9 ?F (36.6 ?C)   Resp 18   Ht '5\' 7"'$  (1.702 m)   Wt 125 lb (56.7 kg) Comment: pt estimated  SpO2 99%   BMI 19.58 kg/m?  ?  ?   ?Objective:  ? Physical Exam ? ?General: Color- Normal Color. Moisture- Normal Moisture. ?  ?Neck ?Carotid Arteries- Normal color. Moisture- Normal Moisture. No carotid bruits. No JVD. ?  ?Chest and Lung Exam ?Auscultation: ?Breath Sounds:-Normal. ?  ?Cardiovascular ?Auscultation:Rythm- Regular. ?Murmurs & Other Heart Sounds:Auscultation of the heart reveals- No Murmurs. ?  ?Abdomen ?Inspection:-Inspeection Normal. ?Palpation/Percussion:Note:No mass. Palpation and Percussion of the abdomen reveal- Non Tender, Non Distended + BS, no rebound or guarding. ?  ?  ?  ?Neurologic ?Cranial Nerve exam:- CN III-XII intact(No nystagmus) ?Upper and lower ext gross movement intact. ? ? ?   ?Assessment & Plan:  ? ?Patient Instructions  ?Recent nausea with episodes of vomiting over the past weekend.  This seems to  have occurred after eating some potentially greasy meals.  We will get metabolic panel and lipase today.  Recommend continue Protonix.  Try to eat healthy.  I will make Zofran available if you have recurrent constant

## 2022-01-11 NOTE — Patient Instructions (Addendum)
Recent nausea with episodes of vomiting over the past weekend.  This seems to have occurred after eating some potentially greasy meals.  We will get metabolic panel and lipase today.  Recommend continue Protonix.  Try to eat healthy.  I will make Zofran available if you have recurrent constant nausea.  Also if you have symptoms after eating you could add on famotidine daily. ? ?Ankle fracture and wrist fracture.  Follow-up with orthopedist.  Glad to hear you are making progress with PT. ? ?For generalized anxiety and depressed mood continue with Lexapro. ? ?Palpitations occurring rare and intermittent.  None for the past 2 weeks.  History of PSVT.  Continue beta-blocker and stay well-hydrated.  If having palpitations would asked that you check pulse and if consistent over 100 please let us know. ? ?For recent stroke continue Plavix and follow-up with Dr. Jaynee Eagles.  The neurologist typically send me follow-up note to review. ? ?Follow-up in 2 months or sooner if needed. ? ? ?

## 2022-01-15 ENCOUNTER — Ambulatory Visit: Payer: No Typology Code available for payment source | Admitting: Physical Therapy

## 2022-01-15 ENCOUNTER — Ambulatory Visit: Payer: No Typology Code available for payment source | Admitting: Occupational Therapy

## 2022-01-16 NOTE — Telephone Encounter (Signed)
Pt informed of below.  She will call us back when she is ready to start [facility].  ?

## 2022-01-17 ENCOUNTER — Ambulatory Visit (INDEPENDENT_AMBULATORY_CARE_PROVIDER_SITE_OTHER): Payer: No Typology Code available for payment source | Admitting: Orthopedic Surgery

## 2022-01-17 ENCOUNTER — Ambulatory Visit (INDEPENDENT_AMBULATORY_CARE_PROVIDER_SITE_OTHER): Payer: No Typology Code available for payment source

## 2022-01-17 ENCOUNTER — Other Ambulatory Visit: Payer: Self-pay

## 2022-01-17 DIAGNOSIS — S82891A Other fracture of right lower leg, initial encounter for closed fracture: Secondary | ICD-10-CM | POA: Diagnosis not present

## 2022-01-17 DIAGNOSIS — S62184A Nondisplaced fracture of trapezoid [smaller multangular], right wrist, initial encounter for closed fracture: Secondary | ICD-10-CM

## 2022-01-17 NOTE — Progress Notes (Signed)
? ?Post-Op Visit Note ?  ?Patient: Alice Williams           ?Date of Birth: 04-Jun-1961           ?MRN: [id number] ?Visit Date: 01/17/2022 ?PCP: Mackie Pai, PA-C ? ? ?Assessment & Plan: ? ?Chief Complaint:  ?Chief Complaint  ?Patient presents with  ? Other  ?   ?Follow up wrist/ankle  ? ?Visit Diagnoses:  ?1. Closed nondisplaced fracture of trapezoid of right wrist, initial encounter   ?2. Closed right ankle fracture, initial encounter   ? ? ?Plan: Patient is a 61 year old female who presents s/p right ankle ORIF on 12/06/2021.  She has been full weightbearing with a walker in the fracture boot since last visit.  She states she has no pain with walking.  Ankle does not give out on her.  Overall her pain is steadily improving.  Continuing on Plavix.  She is continued with occupational therapy, physical therapy, speech therapy following her stroke.  She has been using a wrist brace with the nondisplaced triquetrum fracture that she sustained at the time of ankle injury as well.  She has been weightbearing with the right wrist brace.  Radiographs of the right ankle and wrist taken today do demonstrate no significant change in fracture alignment of the right ankle with no compromise of the hardware.  No significant fracture line noted in the right wrist radiographs.  She has absolutely no tenderness over the medial malleolus and 1/10 tenderness over the lateral malleolus.  Incisions are well-healed.  No sign of DVT on exam today.  Patient is overall doing well.  She has no tenderness throughout the right wrist aside from some very minimal tenderness over the volar aspect of the ulnar wrist.  She has some expected stiffness of the wrist but there is no pain with passive and active range of motion of her wrist. ? ?Plan is transition to regular shoe with walker for balance.  She may wean off the walker as she works with physical therapy more and more.  Okay to discontinue the right wrist brace entirely.  Full weightbearing  with the right wrist.  Follow-up for final check postoperatively with Dr. Marlou Sa in 4 weeks. ? ?Follow-Up Instructions: No follow-ups on file.  ? ?Orders:  ?Orders Placed This Encounter  ?Procedures  ? XR Ankle Complete Right  ? XR Wrist Complete Right  ? ?No orders of the defined types were placed in this encounter. ? ? ?Imaging: ?No results found. ? ?PMFS History: ?Patient Active Problem List  ? Diagnosis Date Noted  ? Acute right MCA stroke ([place]) 12/07/2021  ? GERD without esophagitis 12/07/2021  ? Right wrist fracture, with routine healing, subsequent encounter 12/07/2021  ? Bimalleolar ankle fracture, right, closed, with routine healing, subsequent encounter   ? Blepharospasm 11/08/2021  ? Sleep disorder, circadian, shift work type 08/01/2021  ? Excessive daytime sleepiness 08/01/2021  ? Morning headache 02/16/2021  ? Left eye pain 02/16/2021  ? Intractable episodic cluster headache 02/16/2021  ? Behavioral insomnia of childhood 02/16/2021  ? Retrognathia 02/16/2021  ? Chronic migraine without aura, with intractable migraine, so stated, with status migrainosus 01/12/2021  ? Muscle spasm 10/14/2020  ? Vitamin D deficiency 08/15/2020  ? Acute bilateral low back pain with bilateral sciatica 07/13/2020  ? Gastric polyp 02/11/2019  ? Sessile colonic polyp 02/11/2019  ? Chronic fatigue 02/11/2019  ? Grade I internal hemorrhoids 02/11/2019  ? Ductal hyperplasia of breast 08/21/2018  ? Cognitive changes 06/24/2018  ? Dyslipidemia  06/24/2018  ? Dense breast tissue on mammogram 04/17/2018  ? Fibrocystic breast determined by biopsy 04/09/2018  ? Family history of breast cancer in mother 04/09/2018  ? Paroxysmal sinus tachycardia ([place]) 03/27/2018  ? Osteoporosis 08/28/2017  ? Facet arthropathy, lumbosacral 08/28/2017  ? Chronic left SI joint pain 08/27/2017  ? Left lumbar radiculopathy 08/27/2017  ? Allergic rhinitis 08/09/2017  ? Disorder of lipoid metabolism 08/09/2017  ? Insomnia secondary to anxiety 08/09/2017  ?  Postmenopausal osteoporosis 08/09/2017  ? History of hysterectomy for benign disease 08/09/2017  ? GAD (generalized anxiety disorder) 08/09/2017  ? Former smoker, stopped smoking in distant past 08/09/2017  ? Minor depressive disorder 08/09/2017  ? Palpitations 03/16/2015  ? Family history of colon cancer 03/02/2015  ? Hiatal hernia with GERD without esophagitis 03/02/2015  ? History of adenomatous polyp of colon 03/02/2015  ? Chronic migraine without aura 10/10/2014  ? History of cardiovascular disorder 01/18/2003  ? ?Past Medical History:  ?Diagnosis Date  ? Anal fissure   ? Anemia   ? in early 20's  ? Anxiety   ? Arthritis   ? back  ? Breast calcifications on mammogram   ? Cancer ([place])   ? pre cancer colon,cervix  ? Colon polyp   ? Dyslipidemia 06/24/2018  ? Elevated cholesterol   ? Facet arthropathy, lumbosacral 08/28/2017  ? Family history of adverse reaction to anesthesia   ? sibling has N/V  ? GERD (gastroesophageal reflux disease)   ? Heart murmur   ? History of colon polyps   ? History of kidney stones   ? has small ones  ? IBS (irritable bowel syndrome)   ? Inappropriate sinus tachycardia   ? Migraines   ? Osteopenia   ? Osteoporosis of femur without pathological fracture 08/28/2017  ? Palpitations   ? PONV (postoperative nausea and vomiting)   ? Premature surgical menopause   ? PTSD (post-traumatic stress disorder)   ? Sleep apnea   ?  ?Family History  ?Problem Relation Age of Onset  ? Colon cancer Mother   ? Breast cancer Mother 37  ?     and liver  ? Migraines Mother   ? Hyperlipidemia Father   ? Alcohol abuse Father   ? Heart attack Father   ? Cancer Father   ?     Head and neck   ? Hyperlipidemia Brother   ? Lung cancer Maternal Grandmother   ? Parkinson's disease Maternal Grandfather   ? Dementia Maternal Grandfather   ? Prostate cancer Maternal Grandfather   ? Parkinson's disease Paternal Grandmother   ? Heart attack Paternal Grandfather   ? Kidney cancer Sister 2  ? Migraines Sister   ? Migraines  Sister   ? Esophageal cancer Neg Hx   ?  ?Past Surgical History:  ?Procedure Laterality Date  ? ANAL FISSURE REPAIR    ? APPENDECTOMY  2000  ? COLONOSCOPY W/ POLYPECTOMY  03/2015  ? COLONOSCOPY WITH ESOPHAGOGASTRODUODENOSCOPY (EGD)  04/01/2015  ? KNEE SURGERY    ? OOPHORECTOMY    ? OOPHORECTOMY    ? 1995, 1998  ? ORIF ANKLE FRACTURE Right 11/28/2021  ? Procedure: OPEN REDUCTION INTERNAL FIXATION (ORIF) ANKLE FRACTURE;  Surgeon: Meredith Pel, MD;  Location: [facility];  Service: Orthopedics;  Laterality: Right;  ? PELVIC LAPAROSCOPY    ? x 4 for endometriosis   ? VAGINAL HYSTERECTOMY  1994  ? ?Social History  ? ?Occupational History  ? Occupation: Therapist, sports  ?Tobacco Use  ? Smoking  status: Former  ?  Types: Cigarettes  ?  Quit date: 08/09/2006  ?  Years since quitting: 15.4  ? Smokeless tobacco: Never  ?Vaping Use  ? Vaping Use: Never used  ?Substance and Sexual Activity  ? Alcohol use: Not Currently  ?  Comment: rare  ? Drug use: No  ? Sexual activity: Never  ?  Birth control/protection: Surgical  ? ? ? ?

## 2022-01-18 ENCOUNTER — Ambulatory Visit: Payer: No Typology Code available for payment source | Admitting: Occupational Therapy

## 2022-01-18 ENCOUNTER — Ambulatory Visit: Payer: No Typology Code available for payment source

## 2022-01-18 ENCOUNTER — Ambulatory Visit: Payer: No Typology Code available for payment source | Admitting: Physical Therapy

## 2022-01-18 DIAGNOSIS — R2681 Unsteadiness on feet: Secondary | ICD-10-CM

## 2022-01-18 DIAGNOSIS — M6281 Muscle weakness (generalized): Secondary | ICD-10-CM

## 2022-01-18 DIAGNOSIS — R471 Dysarthria and anarthria: Secondary | ICD-10-CM

## 2022-01-18 DIAGNOSIS — R296 Repeated falls: Secondary | ICD-10-CM

## 2022-01-18 DIAGNOSIS — M25631 Stiffness of right wrist, not elsewhere classified: Secondary | ICD-10-CM

## 2022-01-18 DIAGNOSIS — I69354 Hemiplegia and hemiparesis following cerebral infarction affecting left non-dominant side: Secondary | ICD-10-CM

## 2022-01-18 NOTE — Patient Instructions (Addendum)
Flexor Tendon Gliding (Active Hook Fist) ? ? ?With fingers and knuckles straight, bend middle and tip joints. Do not bend large knuckles. ?Repeat _10-15___ times. Do _3___ sessions per day. ? ?MP Flexion (Active) ? ? ?With back of hand on table, bend large knuckles as far as they will go, keeping small joints straight. ?Repeat _10-15___ times. Do __3_ sessions per day. ?Activity: Reach into a narrow container.* ? ?  ?  ?Finger Flexion / Extension ? ? ?With palm up, bend fingers of left hand toward palm, making a  fist. Straighten fingers, opening fist. ?Repeat sequence _10-15___ times per session. Do _3__ sessions per day. ?Hand Variation: Palm down  ? ?Copyright ? VHI. All rights reserved.   ? ? ? ? ? ?PROM: Wrist Flexion / Extension ? ? ?Grasp  hand and slowly bend wrist until stretch is felt. Relax. Then stretch as far as possible in opposite direction. Be sure to keep elbow bent.  Hold __10__ sec. each way ?Repeat _10_ times per set.    Do _2-3__ sessions per day. ? ? ?[employer] analyst) ? ? ? ?Sitting with elbows on table and palms together, slowly lower wrists toward table until stretch is felt. Be sure to keep palms together throughout stretch. Hold __10__ seconds. Relax. ?Repeat _5-10___ times. Do 2___ sessions per day. ? ?Copyright ? VHI. All rights reserved.  ? ?

## 2022-01-18 NOTE — Therapy (Signed)
[facility] ?[place] ?[place], [place], [address] ?Phone: [phone]   Fax:  [phone] ? ?Occupational Therapy Treatment ? ?Patient Details  ?Name: Alice Williams ?MRN: [id number] ?Date of Birth: Jan 02, 1961 ?Referring Provider (OT): Dr. Sarina Ill ? ? ?Encounter Date: 01/18/2022 ? ? OT End of Session - 01/18/22 1027   ? ? Visit Number 5   ? Number of Visits 25   ? Date for OT Re-Evaluation 03/25/22   ? Authorization Type [employer] 2023  copay $40 per visit  No Ded , VL: [place]  Auth Reqd: after 12th visit   ? Authorization - Visit Number 5   corrected count  ? Authorization - Number of Visits 12   ? OT Start Time 1018   ? OT Stop Time 1100   ? OT Time Calculation (min) 42 min   ? Activity Tolerance Patient tolerated treatment well   ? Behavior During Therapy [facility] for tasks assessed/performed   ? ?  ?  ? ?  ? ? ?Past Medical History:  ?Diagnosis Date  ? Anal fissure   ? Anemia   ? in early 20's  ? Anxiety   ? Arthritis   ? back  ? Breast calcifications on mammogram   ? Cancer ([place])   ? pre cancer colon,cervix  ? Colon polyp   ? Dyslipidemia 06/24/2018  ? Elevated cholesterol   ? Facet arthropathy, lumbosacral 08/28/2017  ? Family history of adverse reaction to anesthesia   ? sibling has N/V  ? GERD (gastroesophageal reflux disease)   ? Heart murmur   ? History of colon polyps   ? History of kidney stones   ? has small ones  ? IBS (irritable bowel syndrome)   ? Inappropriate sinus tachycardia   ? Migraines   ? Osteopenia   ? Osteoporosis of femur without pathological fracture 08/28/2017  ? Palpitations   ? PONV (postoperative nausea and vomiting)   ? Premature surgical menopause   ? PTSD (post-traumatic stress disorder)   ? Sleep apnea   ? ? ?Past Surgical History:  ?Procedure Laterality Date  ? ANAL FISSURE REPAIR    ? APPENDECTOMY  2000  ? COLONOSCOPY W/ POLYPECTOMY  03/2015  ? COLONOSCOPY WITH ESOPHAGOGASTRODUODENOSCOPY (EGD)  04/01/2015  ? KNEE SURGERY     ? OOPHORECTOMY    ? OOPHORECTOMY    ? 1995, 1998  ? ORIF ANKLE FRACTURE Right 11/28/2021  ? Procedure: OPEN REDUCTION INTERNAL FIXATION (ORIF) ANKLE FRACTURE;  Surgeon: Meredith Pel, MD;  Location: [facility];  Service: Orthopedics;  Laterality: Right;  ? PELVIC LAPAROSCOPY    ? x 4 for endometriosis   ? VAGINAL HYSTERECTOMY  1994  ? ? ?There were no vitals filed for this visit. ? ? Subjective Assessment - 01/18/22 1025   ? ? Subjective  no pain   ? Limitations 01/05/22- received after therapy session was completed - per MD-Okay for AROM and PROM of right wrist. Okay for strengthening exercises in the brace  We will dc brace in ~2 weeks as long as she is doing okay on exam at that point, (pt is cleared for walker use and WB through RLE also)   ? Patient Stated Goals get back to where I was before (return to living alone and go back to work)   ? Currently in Pain? Yes   ? Pain Score 2    ? Pain Location Wrist   ? Pain Orientation Right   ? Pain Descriptors /  Indicators Aching   ? Pain Type Acute pain   ? Pain Onset More than a month ago   ? Pain Frequency Intermittent   ? ?  ?  ? ?  ? ? ? ? ? ?Treatment: Pt. Reports pain and stiffness in RUE. Pt states MD said she can be out of the wrist brace. ?Fluidotherapy x 9 mins to right hand and wrist for stiffness and pain with pt performing A/ROM while in fluido., No adverse reactions. ?A/ROM wrist flexion/ extension, supination/ pronation followed by P/ROM wrist flexion/ extension, and tendongliding. ?Light functional use of RUE: flipping and dealing cards, stacking and manipulating coins in RUE, min v.c to avoid compensation at shoulder. ?Pt reports RUE feels better at end of session. ? ? ? ? ? ? ? ? ? ? ? ? ? ? ? ? ? OT Education - 01/18/22 1047   ? ? Education Details P/ROM wrist flexion/ extension and tendon gliding A/ROM   ? Person(s) Educated Patient   ? Methods Explanation;Handout;Verbal cues;Demonstration   ? Comprehension Verbalized understanding;Returned  demonstration;Verbal cues required   ? ?  ?  ? ?  ? ? ? OT Short Term Goals - 01/01/22 1217   ? ?  ? OT SHORT TERM GOAL #1  ? Title Pt will be independent with initial HEP for strength/activity tolerance.--check STGs 01/23/22   ? Time 4   ? Period Weeks   ? Status On-going   ?  ? OT SHORT TERM GOAL #2  ? Title Pt will be independent with initial HEP for R wrist ROM (once cleared by MD).   ? Time 4   ? Period Weeks   ? Status On-going   ?  ? OT SHORT TERM GOAL #3  ? Title Pt will perform BADLs mod I.   ? Time 4   ? Period Weeks   ? Status New   ?  ? OT SHORT TERM GOAL #4  ? Title Pt will perform simple home maintenance/snack prep with supervision.   ? Time 4   ? Period Weeks   ? Status New   ? ?  ?  ? ?  ? ? ? ? OT Long Term Goals - 12/25/21 1234   ? ?  ? OT LONG TERM GOAL #1  ? Title Pt will be independent with updated strengthening HEP for bilateral UEs/R wrist (once cleared by MD).--check LTGs 03/25/22   ? Time 12   ? Period Weeks   ? Status New   ?  ? OT LONG TERM GOAL #2  ? Title Pt will perform simple clooking and cleaning tasks mod I.   ? Time 12   ? Period Weeks   ? Status New   ?  ? OT LONG TERM GOAL #3  ? Title Pt will be able to complete IADL task for at least 70mn prior to rest break.   ? Time 12   ? Period Weeks   ? Status New   ?  ? OT LONG TERM GOAL #4  ? Title Pt will demo at least 35lbs R grip strength for lifting/carrying objects and opening containers.   ? Time 12   ? Period Weeks   ? Status New   ?  ? OT LONG TERM GOAL #5  ? Title Pt will demo at least 60* wrist flex/ext for ADLs/IADLs.   ? Time 12   ? Period Weeks   ? Status New   ? ?  ?  ? ?  ? ? ? ? ? ? ? ?  Plan - 01/18/22 1028   ? ? Clinical Impression Statement Pt progressing  towards goals. She demonstrates improving A/ROM for righth and  wrist.   ? OT Occupational Profile and History Detailed Assessment- Review of Records and additional review of physical, cognitive, psychosocial history related to current functional performance   ?  Occupational performance deficits (Please refer to evaluation for details): ADL's;IADL's;Work;Leisure   ? Body Structure / Function / Physical Skills ADL;Decreased knowledge of use of DME;Strength;Balance;UE functional use;ROM;IADL;Endurance;Mobility   ? Rehab Potential Good   ? Clinical Decision Making Several treatment options, min-mod task modification necessary   ? Comorbidities Affecting Occupational Performance: May have comorbidities impacting occupational performance   ? Modification or Assistance to Complete Evaluation  No modification of tasks or assist necessary to complete eval   ? OT Frequency 2x / week   ? OT Duration 12 weeks   +eval, may modify based on scheduling needs/when precautions are lifted  ? OT Treatment/Interventions Self-care/ADL training;Moist Heat;Fluidtherapy;DME and/or AE instruction;Splinting;Therapeutic activities;Balance training;Contrast Bath;Ultrasound;Therapeutic exercise;Passive range of motion;Functional Mobility Training;Neuromuscular education;Cryotherapy;Electrical Stimulation;Paraffin;Manual Therapy;Patient/family education;Energy conservation   ? Plan UBE, dynamic standing balance, A/ROM and P/ROM for RUE, ?fluido   ? Consulted and Agree with Plan of Care Patient   ? ?  ?  ? ?  ? ? ?Patient will benefit from skilled therapeutic intervention in order to improve the following deficits and impairments:   ?Body Structure / Function / Physical Skills: ADL, Decreased knowledge of use of DME, Strength, Balance, UE functional use, ROM, IADL, Endurance, Mobility ?  ?  ? ? ?Visit Diagnosis: ?Stiffness of right wrist, not elsewhere classified ? ?Hemiplegia and hemiparesis following cerebral infarction affecting left non-dominant side ([place]) ? ?Muscle weakness (generalized) ? ?Unsteadiness on feet ? ? ? ?Problem List ?Patient Active Problem List  ? Diagnosis Date Noted  ? Acute right MCA stroke ([place]) 12/07/2021  ? GERD without esophagitis 12/07/2021  ? Right wrist fracture, with  routine healing, subsequent encounter 12/07/2021  ? Bimalleolar ankle fracture, right, closed, with routine healing, subsequent encounter   ? Blepharospasm 11/08/2021  ? Sleep disorder, circadian, shift work type 08/01/2021

## 2022-01-18 NOTE — Therapy (Signed)
?OUTPATIENT PHYSICAL THERAPY TREATMENT NOTE ? ? ?Patient Name: Alice Williams ?MRN: [id number] ?DOB:1960-11-26, 61 y.o., female ?Today's Date: 01/18/2022 ? ?PCP: Mackie Pai, PA-C ?REFERRING PROVIDER: Melvenia Beam, MD  ? ? PT End of Session - 01/18/22 1109   ? ? Visit Number 5   ? Number of Visits 25   ? Date for PT Re-Evaluation 03/14/22   ? Authorization Type [facility] Needs auth after 12th visit   ? Authorization - Visit Number 1   ? Authorization - Number of Visits 12   ? PT Start Time 1106   ? PT Stop Time 1146   ? PT Time Calculation (min) 40 min   ? Equipment Utilized During Treatment Gait belt   ? Activity Tolerance Patient tolerated treatment well   Orthostatic  ? Behavior During Therapy [facility] for tasks assessed/performed   ? ?  ?  ? ?  ? ? ? ? ? ?Past Medical History:  ?Diagnosis Date  ? Anal fissure   ? Anemia   ? in early 20's  ? Anxiety   ? Arthritis   ? back  ? Breast calcifications on mammogram   ? Cancer ([place])   ? pre cancer colon,cervix  ? Colon polyp   ? Dyslipidemia 06/24/2018  ? Elevated cholesterol   ? Facet arthropathy, lumbosacral 08/28/2017  ? Family history of adverse reaction to anesthesia   ? sibling has N/V  ? GERD (gastroesophageal reflux disease)   ? Heart murmur   ? History of colon polyps   ? History of kidney stones   ? has small ones  ? IBS (irritable bowel syndrome)   ? Inappropriate sinus tachycardia   ? Migraines   ? Osteopenia   ? Osteoporosis of femur without pathological fracture 08/28/2017  ? Palpitations   ? PONV (postoperative nausea and vomiting)   ? Premature surgical menopause   ? PTSD (post-traumatic stress disorder)   ? Sleep apnea   ? ?Past Surgical History:  ?Procedure Laterality Date  ? ANAL FISSURE REPAIR    ? APPENDECTOMY  2000  ? COLONOSCOPY W/ POLYPECTOMY  03/2015  ? COLONOSCOPY WITH ESOPHAGOGASTRODUODENOSCOPY (EGD)  04/01/2015  ? KNEE SURGERY    ? OOPHORECTOMY    ? OOPHORECTOMY    ? 1995, 1998  ? ORIF ANKLE FRACTURE Right 11/28/2021  ? Procedure: OPEN  REDUCTION INTERNAL FIXATION (ORIF) ANKLE FRACTURE;  Surgeon: Meredith Pel, MD;  Location: [place];  Service: Orthopedics;  Laterality: Right;  ? PELVIC LAPAROSCOPY    ? x 4 for endometriosis   ? VAGINAL HYSTERECTOMY  1994  ? ?Patient Active Problem List  ? Diagnosis Date Noted  ? Acute right MCA stroke ([place]) 12/07/2021  ? GERD without esophagitis 12/07/2021  ? Right wrist fracture, with routine healing, subsequent encounter 12/07/2021  ? Bimalleolar ankle fracture, right, closed, with routine healing, subsequent encounter   ? Blepharospasm 11/08/2021  ? Sleep disorder, circadian, shift work type 08/01/2021  ? Excessive daytime sleepiness 08/01/2021  ? Morning headache 02/16/2021  ? Left eye pain 02/16/2021  ? Intractable episodic cluster headache 02/16/2021  ? Behavioral insomnia of childhood 02/16/2021  ? Retrognathia 02/16/2021  ? Chronic migraine without aura, with intractable migraine, so stated, with status migrainosus 01/12/2021  ? Muscle spasm 10/14/2020  ? Vitamin D deficiency 08/15/2020  ? Acute bilateral low back pain with bilateral sciatica 07/13/2020  ? Gastric polyp 02/11/2019  ? Sessile colonic polyp 02/11/2019  ? Chronic fatigue 02/11/2019  ? Grade I internal hemorrhoids 02/11/2019  ?  Ductal hyperplasia of breast 08/21/2018  ? Cognitive changes 06/24/2018  ? Dyslipidemia 06/24/2018  ? Dense breast tissue on mammogram 04/17/2018  ? Fibrocystic breast determined by biopsy 04/09/2018  ? Family history of breast cancer in mother 04/09/2018  ? Paroxysmal sinus tachycardia ([place]) 03/27/2018  ? Osteoporosis 08/28/2017  ? Facet arthropathy, lumbosacral 08/28/2017  ? Chronic left SI joint pain 08/27/2017  ? Left lumbar radiculopathy 08/27/2017  ? Allergic rhinitis 08/09/2017  ? Disorder of lipoid metabolism 08/09/2017  ? Insomnia secondary to anxiety 08/09/2017  ? Postmenopausal osteoporosis 08/09/2017  ? History of hysterectomy for benign disease 08/09/2017  ? GAD (generalized anxiety disorder) 08/09/2017   ? Former smoker, stopped smoking in distant past 08/09/2017  ? Minor depressive disorder 08/09/2017  ? Palpitations 03/16/2015  ? Family history of colon cancer 03/02/2015  ? Hiatal hernia with GERD without esophagitis 03/02/2015  ? History of adenomatous polyp of colon 03/02/2015  ? Chronic migraine without aura 10/10/2014  ? History of cardiovascular disorder 01/18/2003  ? ? ?REFERRING DIAG: R53.1 (ICD-10-CM) - Left-sided weakness R41.4 (ICD-10-CM) - Left-sided neglect I63.411 (ICD-10-CM) - Cerebrovascular accident (CVA) due to embolism of right middle cerebral artery (HCC) I63.9,I77.70 (ICD-10-CM) - Cerebral ischemic stroke due to arterial dissection (HCC)  ? ?THERAPY DIAG:  ?Muscle weakness (generalized) ? ?Unsteadiness on feet ? ?Repeated falls ? ?PERTINENT HISTORY: SVT, migraine headaches, PTSD, anxiety, irritable bowel syndrome, GERD, osteoporosis, sleep apnea, left sciatica.  ? ?PRECAUTIONS: Fall, L hemi, WBAT RLE and R wrist, okay to do ROM of R ankle  ? ?SUBJECTIVE: Pt reports she is feeling better, excited to try walking with a tennis shoe on R foot today. No new changes  ? ?PAIN:  ?Are you having pain? No ? ?VITALS-BP all taken in LUE  ? -Pre-session taken seated: 103/73 mmHg, asymptomatic ? ?TODAY'S TREATMENT:  ?  Pt wore compression sock on BLEs throughout session  ? ?Ther Ex   ?Updated and demonstrated HEP (see bolded below) for improved BLE strength and stretching of R ankle.  ? ?-Pt performed sit <>stands x5 w/BUE support and mod multimodal cues for anterior weight shift using NDT principles.  ? ?Gait training  ?Removed surgical boot and donned R tennis shoe.  ? ?Gait pattern: step to pattern, decreased step length- Left, decreased stance time- Right, decreased hip/knee flexion- Left, decreased ankle dorsiflexion- Left, knee flexed in stance- Right, shuffling, and poor foot clearance- Left ?Distance walked: 1' x2 ?Assistive device utilized: Environmental consultant - 2 wheeled ?Level of assistance: SBA ?Comments:  Pt did not report symptoms of orthostasis throughout session, so BP not assessed. Pt ambulating with decreased cadence and over reliance on BUEs on RW. Noted poor step clearance of LLE due to R knee being flexed in stance phase. Min verbal cues for knee extension of RLE and increased step length/clearance of LLE.  ? ?Gait pattern: step to pattern, decreased step length- Left, decreased stance time- Right, decreased stride length, decreased ankle dorsiflexion- Right, Left foot flat, knee flexed in stance- Right, antalgic, and poor foot clearance- Left ?Distance walked: 12' ?Assistive device utilized:  HHA  ?Level of assistance: Mod A ?Comments: Pt very hesitant to attempt to walk without RW and demonstrated significant R lean into therapist and step-to patten of LLE. Mod verbal cues for R knee extension and mod tactile cues for lateral weight shift.  ? ?  ? ?PATIENT EDUCATION: ?Education details: Updates to HEP, proper gait kinematics, proper sit <>stands  ?Person educated: Patient ?Education method: Explanation and Handouts ?Education comprehension:  verbalized understanding and needs further education ? ? ?HOME EXERCISE PROGRAM: ?Access Code: [email or web address] ?URL: [email or web address] ?Date: 01/18/2022 ?Prepared by: Mickie Bail Yussef Jorge ? ?Exercises ?- Standing March with Counter Support  - 1 x daily - 7 x weekly - 3 sets - 10 reps ?- Proper Sit to Stand Technique  - 1 x daily - 7 x weekly - 3 sets - 10 reps ?- Calf stretch  - 1 x daily - 7 x weekly - 3 sets - 10 reps - 30s hold ?- Heel Raises with Counter Support  - 1 x daily - 7 x weekly - 3 sets - 10 reps - 3s hold ? ? ? ? PT Short Term Goals - 01/09/22 1210   ? ?  ? PT SHORT TERM GOAL #1  ? Title Pt will be independent and compliant with initial HEP in order to improve functional mobility.  (Target Date: 01/13/22)   ? Time 4   ? Period Weeks   ? Status Achieved   ? Target Date 01/13/22   ?  ? PT SHORT TERM GOAL #2  ? Title Pt will perform lateral scoot  transfers both directions at min A level maintaining NWB precautions without cues.   ? Time 4   ? Period Weeks   ? Status Achieved   ?  ? PT SHORT TERM GOAL #3  ? Title Will assess and address any vertigo that ma

## 2022-01-18 NOTE — Therapy (Signed)
[facility] ?[place], [place], [address] ?Phone: [phone]   Fax:  [phone] ? ?Speech Language Pathology Treatment ? ?Patient Details  ?Name: Alice Williams ?MRN: [id number] ?Date of Birth: 1961/05/01 ?Referring Provider (SLP): Dr. Rosalin Hawking ? ? ?Encounter Date: 01/18/2022 ? ? End of Session - 01/18/22 1148   ? ? Visit Number 5   ? Number of Visits 25   ? Date for SLP Re-Evaluation 03/23/22   ? Authorization Type needs auth after 12 visits combined - will need to monitor OT/PT visits   ? Authorization - Visit Number 12   ? Authorization - Number of Visits 12   ? SLP Start Time 1148   ? SLP Stop Time  1230   ? SLP Time Calculation (min) 42 min   ? Activity Tolerance Patient tolerated treatment well   ? ?  ?  ? ?  ? ? ?Past Medical History:  ?Diagnosis Date  ? Anal fissure   ? Anemia   ? in early 20's  ? Anxiety   ? Arthritis   ? back  ? Breast calcifications on mammogram   ? Cancer ([place])   ? pre cancer colon,cervix  ? Colon polyp   ? Dyslipidemia 06/24/2018  ? Elevated cholesterol   ? Facet arthropathy, lumbosacral 08/28/2017  ? Family history of adverse reaction to anesthesia   ? sibling has N/V  ? GERD (gastroesophageal reflux disease)   ? Heart murmur   ? History of colon polyps   ? History of kidney stones   ? has small ones  ? IBS (irritable bowel syndrome)   ? Inappropriate sinus tachycardia   ? Migraines   ? Osteopenia   ? Osteoporosis of femur without pathological fracture 08/28/2017  ? Palpitations   ? PONV (postoperative nausea and vomiting)   ? Premature surgical menopause   ? PTSD (post-traumatic stress disorder)   ? Sleep apnea   ? ? ?Past Surgical History:  ?Procedure Laterality Date  ? ANAL FISSURE REPAIR    ? APPENDECTOMY  2000  ? COLONOSCOPY W/ POLYPECTOMY  03/2015  ? COLONOSCOPY WITH ESOPHAGOGASTRODUODENOSCOPY (EGD)  04/01/2015  ? KNEE SURGERY    ? OOPHORECTOMY    ? OOPHORECTOMY    ? 1995, 1998  ? ORIF ANKLE FRACTURE Right  11/28/2021  ? Procedure: OPEN REDUCTION INTERNAL FIXATION (ORIF) ANKLE FRACTURE;  Surgeon: Meredith Pel, MD;  Location: [facility];  Service: Orthopedics;  Laterality: Right;  ? PELVIC LAPAROSCOPY    ? x 4 for endometriosis   ? VAGINAL HYSTERECTOMY  1994  ? ? ?There were no vitals filed for this visit. ? ? Subjective Assessment - 01/18/22 1150   ? ? Subjective "I'm now walking with my walker"   ? Currently in Pain? Yes   ? Pain Score 1    ? Pain Location Knee   ? ?  ?  ? ?  ? ? ? ? ? ? ? ? ADULT SLP TREATMENT - 01/18/22 1148   ? ?  ? General Information  ? Behavior/Cognition Alert;Cooperative;Pleasant mood   ?  ? Treatment Provided  ? Treatment provided Cognitive-Linquistic   ?  ? Cognitive-Linquistic Treatment  ? Treatment focused on Dysarthria;Patient/family/caregiver education   ? Skilled Treatment Pt enters with improved vocal quality and clarity, although intermittent hoarseness exhibited. SLP instructed SOVTE to warm up voice, combination of flow phonation techniques to reduce muscle tension and hoarseness, and dysarthria compensations to optimize vocal intensity. In structured  tasks and structured conversation, pt benefited from intermittent cues to maintain carryover of compensations for improved vocal quality/clarity. Carryover of compensations decreased as pt fatigued.   ?  ? Assessment / Recommendations / Plan  ? Plan Continue with current plan of care   ?  ? Progression Toward Goals  ? Progression toward goals Progressing toward goals   ? ?  ?  ? ?  ? ? ? SLP Education - 01/18/22 1235   ? ? Education Details forward focused phonation, breath support, flow   ? Person(s) Educated Patient   ? Methods Explanation;Demonstration;Verbal cues   ? Comprehension Verbalized understanding;Returned demonstration;Need further instruction   ? ?  ?  ? ?  ? ? ? SLP Short Term Goals - 01/18/22 1148   ? ?  ? SLP SHORT TERM GOAL #1  ? Title Pt will complete loud AH with average of 85dB over 2 sessions with rare min A   ?  Baseline 01-01-22, 01-18-22   ? Time 2   ? Period Weeks   ? Status Achieved   ?  ? SLP SHORT TERM GOAL #2  ? Title Pt will complete HEP for voice/dysarthria (PHoRTE) with rare min A over 2 sessions   ? Baseline 01-18-22   ? Time 2   ? Period Weeks   ? Status On-going   ?  ? SLP SHORT TERM GOAL #3  ? Title Pt will average 72dB 18/20 sentences in structured speech tasks   ? Baseline 01-01-22, 01-18-22   ? Time 2   ? Period Weeks   ? Status Achieved   ?  ? SLP SHORT TERM GOAL #4  ? Title Pt will maintain average of 70dB and clear phonation over 8 minute conversation with occasional min A   ? Time 2   ? Period Weeks   ? Status On-going   ? ?  ?  ? ?  ? ? ? SLP Long Term Goals - 01/18/22 1149   ? ?  ? SLP LONG TERM GOAL #1  ? Title Pt will complete HEP for dysarthria with mod I   ? Time 10   ? Period Weeks   ? Status On-going   ?  ? SLP LONG TERM GOAL #2  ? Title Pt will self correct low, hoarse voice with rare min A as needed over 2 sessions   ? Time 10   ? Period Weeks   ? Status On-going   ?  ? SLP LONG TERM GOAL #3  ? Title Pt will average 70dB over 15 minute conversation with rare min A   ? Time 10   ? Period Weeks   ? Status On-going   ?  ? SLP LONG TERM GOAL #4  ? Title Pt will be 100% intelligible in noisy environment with rare min A over 15 minute conversation   ? Time 10   ? Period Weeks   ? Status On-going   ?  ? SLP LONG TERM GOAL #5  ? Title Pt will improve score on Voice Related Quality of Life Measure (VRQOL) raw score by 2 points   ? Baseline raw score - 20; calculated score - 75   ? Time 10   ? Period Weeks   ? Status On-going   ? ?  ?  ? ?  ? ? ? Plan - 01/18/22 1148   ? ? Clinical Impression Statement Today, Alice Williams presents with mild hypokinetic dysarthria c/b low volume and occasional roughness/hoarseness still  present but improved over last several sessions. SLP educated and instructed SOVTE and flow phonation exercises to optimize vocal quality and reduce tension/roughness, with good intermittent benefit.  Improving awareness and carryover of forward focused phonation and intentional voicing exhibited on structured speech tasks. Pt would benefit from skilled ST intervention to optimize speech intelligilbility and communication effectiveness.   ? Speech Therapy Frequency 2x / week   ? Duration 12 weeks   ? Treatment/Interventions SLP instruction and feedback;Compensatory strategies;Functional tasks;Patient/family education;Multimodal communcation approach;Internal/external aids;Environmental controls   ? Potential to Achieve Goals Good   ? ?  ?  ? ?  ? ? ?Patient will benefit from skilled therapeutic intervention in order to improve the following deficits and impairments:   ?Dysarthria and anarthria ? ? ? ?Problem List ?Patient Active Problem List  ? Diagnosis Date Noted  ? Acute right MCA stroke ([place]) 12/07/2021  ? GERD without esophagitis 12/07/2021  ? Right wrist fracture, with routine healing, subsequent encounter 12/07/2021  ? Bimalleolar ankle fracture, right, closed, with routine healing, subsequent encounter   ? Blepharospasm 11/08/2021  ? Sleep disorder, circadian, shift work type 08/01/2021  ? Excessive daytime sleepiness 08/01/2021  ? Morning headache 02/16/2021  ? Left eye pain 02/16/2021  ? Intractable episodic cluster headache 02/16/2021  ? Behavioral insomnia of childhood 02/16/2021  ? Retrognathia 02/16/2021  ? Chronic migraine without aura, with intractable migraine, so stated, with status migrainosus 01/12/2021  ? Muscle spasm 10/14/2020  ? Vitamin D deficiency 08/15/2020  ? Acute bilateral low back pain with bilateral sciatica 07/13/2020  ? Gastric polyp 02/11/2019  ? Sessile colonic polyp 02/11/2019  ? Chronic fatigue 02/11/2019  ? Grade I internal hemorrhoids 02/11/2019  ? Ductal hyperplasia of breast 08/21/2018  ? Cognitive changes 06/24/2018  ? Dyslipidemia 06/24/2018  ? Dense breast tissue on mammogram 04/17/2018  ? Fibrocystic breast determined by biopsy 04/09/2018  ? Family history of breast  cancer in mother 04/09/2018  ? Paroxysmal sinus tachycardia ([place]) 03/27/2018  ? Osteoporosis 08/28/2017  ? Facet arthropathy, lumbosacral 08/28/2017  ? Chronic left SI joint pain 08/27/2017  ? Left lum

## 2022-01-20 ENCOUNTER — Encounter: Payer: Self-pay | Admitting: Orthopedic Surgery

## 2022-01-22 ENCOUNTER — Ambulatory Visit: Payer: No Typology Code available for payment source | Admitting: Occupational Therapy

## 2022-01-22 ENCOUNTER — Encounter: Payer: Self-pay | Admitting: Orthopedic Surgery

## 2022-01-22 ENCOUNTER — Ambulatory Visit: Payer: No Typology Code available for payment source | Admitting: Speech Pathology

## 2022-01-22 ENCOUNTER — Encounter: Payer: Self-pay | Admitting: Medical

## 2022-01-22 ENCOUNTER — Other Ambulatory Visit (HOSPITAL_COMMUNITY): Payer: Self-pay

## 2022-01-22 MED ORDER — ATORVASTATIN CALCIUM 40 MG PO TABS
40.0000 mg | ORAL_TABLET | Freq: Every day | ORAL | 1 refills | Status: DC
  Filled 2022-01-22: qty 90, 90d supply, fill #0
  Filled 2022-05-14: qty 90, 90d supply, fill #1

## 2022-01-23 ENCOUNTER — Ambulatory Visit (INDEPENDENT_AMBULATORY_CARE_PROVIDER_SITE_OTHER): Payer: No Typology Code available for payment source | Admitting: Neurology

## 2022-01-23 ENCOUNTER — Other Ambulatory Visit: Payer: Self-pay

## 2022-01-23 ENCOUNTER — Encounter: Payer: Self-pay | Admitting: Neurology

## 2022-01-23 VITALS — BP 111/70 | HR 73 | Ht 67.0 in | Wt 124.4 lb

## 2022-01-23 DIAGNOSIS — I7771 Dissection of carotid artery: Secondary | ICD-10-CM | POA: Diagnosis not present

## 2022-01-23 DIAGNOSIS — I63511 Cerebral infarction due to unspecified occlusion or stenosis of right middle cerebral artery: Secondary | ICD-10-CM

## 2022-01-23 DIAGNOSIS — G43711 Chronic migraine without aura, intractable, with status migrainosus: Secondary | ICD-10-CM

## 2022-01-23 NOTE — Progress Notes (Signed)
?[facility] ? ? ? ?Provider:  Dr Jaynee Eagles ?Requesting Provider: Rosalin Hawking, MD ?Primary Care Provider:  Mackie Pai, PA-C ? ?CC:  Chronic migraines, carotid dissection, right MCA stroke ? ?01/23/2022: Is here for follow-up of carotid dissection 12/07/2021.  I reviewed notes from patient's admission, she was admitted in February 2023, she stated that 2 weeks prior she had had a bad fall, tripped over the right leg with right ankle fracture and wrist fracture, the day before admission she had significant pain in the right neck and pulsatile tinnitus in the right ear.  CTA showed right ICA 70% stenosis with circumferential fibrofatty plaque and possible adherent thrombus, MRA of the neck showed mid cervical right ICA with extensive peripheral mural thrombus more likely hematoma from dissection, also right MCA embolic stroke.  Patient was started on dual antiplatelet therapy.  She is here today for follow-up.  Surprisingly she is doing extremely well, she is following with orthopedics for her closed right ankle fracture of which she underwent fixation of the bimalleolar portion of the fracture, she was nonweightbearing but is now using a walker.  We reviewed MRI of the brain, CTAs, discussed carotid dissection, she is actually doing extremely well considering, I did not appreciate any significant left-sided weakness, however she is still having difficulty with ambulation due to her fracture of her leg and right wrist.Therapy is helping her, her voice is still a little weak, and the right leg from the fall.  She is still on the dual antiplatelet. The neck pain is gone and the pulsating is gone, she has not had a migraine since the fall while on vyepti. ? ?Personally reviewed imaging and agree with the following: ? ?MRI brain (reviewed images with patient as well) ? ?IMPRESSION: ?1. Patchy acute ischemic nonhemorrhagic right MCA distribution ?infarct as above. No associated mass effect. ?2. Abnormal  flow void within the petrous and cavernous right ICA, ?which could be related to slow flow and/or occlusion. ?3. Otherwise normal brain MRI for age. ? ? ?CTA H&N 2/16 ?EXAM:  ?CT ANGIOGRAPHY HEAD AND NECK  ? ?TECHNIQUE:  ?Multidetector CT imaging of the head and neck was performed using  ?the standard protocol during bolus administration of intravenous  ?contrast. Multiplanar CT image reconstructions and MIPs were  ?obtained to evaluate the vascular anatomy. Carotid stenosis  ?measurements (when applicable) are obtained utilizing NASCET  ?criteria, using the distal internal carotid diameter as the  ?denominator.  ? ?RADIATION DOSE REDUCTION: This exam was performed according to the  ?departmental dose-optimization program which includes automated  ?exposure control, adjustment of the mA and/or kV according to  ?patient size and/or use of iterative reconstruction technique.  ? ?CONTRAST:  6m OMNIPAQUE IOHEXOL 350 MG/ML SOLN  ? ?COMPARISON:  CT head 12/14/2021.  MRI head 12/07/2021  ? ?FINDINGS:  ?CTA NECK FINDINGS  ? ?Aortic arch: Standard branching. Imaged portion shows no evidence of  ?aneurysm or dissection. No significant stenosis of the major arch  ?vessel origins.  ? ?Right carotid system: Segmental narrowing of the right internal  ?carotid artery above the bifurcation. No associated calcification.  ?There is diffuse circumferential thickening of the wall of the  ?internal carotid in this area. No intraluminal thrombus. Probable  ?dissection. Estimated 50% diameter stenosis.  ? ?Left carotid system: Normal left carotid without atherosclerotic  ?disease or stenosis. No dissection.  ? ?Vertebral arteries: Both vertebral arteries patent to the skull base  ?without stenosis.  ? ?Skeleton: No acute skeletal abnormality.  Scoliosis.  ? ?  Other neck: Negative for mass or edema in the neck.  ? ?Upper chest: Lung apices clear bilaterally.  ? ?Review of the MIP images confirms the above findings  ? ?CTA HEAD FINDINGS   ? ?Anterior circulation: Cavernous carotid widely patent bilaterally  ?without stenosis. Anterior and middle cerebral arteries patent  ?without stenosis or large vessel occlusion. No lesion to account for  ?the right MCA infarct.  ? ?Posterior circulation: Both vertebral arteries patent to the basilar  ?without stenosis. Left PICA patent. Right PICA not visualized.  ?Basilar widely patent. Superior cerebellar and posterior cerebral  ?arteries patent without stenosis.  ? ?Venous sinuses: Normal venous enhancement  ? ?Anatomic variants: None  ? ?Review of the MIP images confirms the above findings  ? ?IMPRESSION:  ?1. Negative for intracranial large vessel occlusion or significant  ?stenosis  ?2. Long segment stenosis of the right internal carotid artery which  ?begins approximately 4 cm above the bifurcation. There is  ?circumferential thickening of the wall of the artery in this area  ?which is most likely dissection. No dissection flap or thrombus  ?identified.  ?3. Left carotid normal.  Both vertebral arteries normal.  ?4. Code stroke imaging results were communicated on 12/14/2021 at  ?6:16 pm to provider Palikh via text page  ?CTA H&N 2/9: ?  ?CLINICAL DATA:  Stroke/TIA, determine embolic source ?  ?EXAM: ?CT ANGIOGRAPHY HEAD AND NECK ?  ?TECHNIQUE: ?Multidetector CT imaging of the head and neck was performed using ?the standard protocol during bolus administration of intravenous ?contrast. Multiplanar CT image reconstructions and MIPs were ?obtained to evaluate the vascular anatomy. Carotid stenosis ?measurements (when applicable) are obtained utilizing NASCET ?criteria, using the distal internal carotid diameter as the ?denominator. ?  ?RADIATION DOSE REDUCTION: This exam was performed according to the ?departmental dose-optimization program which includes automated ?exposure control, adjustment of the mA and/or kV according to ?patient size and/or use of iterative reconstruction technique. ?  ?CONTRAST:  8m  OMNIPAQUE IOHEXOL 350 MG/ML SOLN ?  ?COMPARISON:  12/06/2021 ?  ?FINDINGS: ?CT HEAD ?  ?Brain: There is no acute intracranial hemorrhage. Hypoattenuation ?with loss of gray-white differentiation is present in the right MCA ?territory involving parietal and temporal lobes and insula. There is ?also involvement of the body of the caudate and a portion of the ?lentiform nucleus. Ventricles and sulci are normal in size and ?configuration. No extra-axial collection. ?  ?Vascular: Linear hyperdensity in the area of infarction probably ?reflects intraluminal thrombus. ?  ?Skull: Calvarium is unremarkable. ?  ?Sinuses/Orbits: No acute finding. ?  ?Other: None. ?  ?Review of the MIP images confirms the above findings ?  ?CTA NECK ?  ?Aortic arch: Great vessel origins are patent. ?  ?Right carotid system: Patent. There is irregularity and narrowing of ?the mildly tortuous cervical ICA beyond the origin. Thickening along ?the wall reflecting fibrofatty plaque or mural thrombus. There is ?some exophytic plaque or adherent thrombus. Stenosis measures up to ?70%. ?  ?Left carotid system: Patent.  No stenosis. ?  ?Vertebral arteries: Patent. Left vertebral is dominant. No stenosis. ?  ?Skeleton: Left facet predominant cervical spine degenerative ?changes. ?  ?Other neck: Unremarkable. ?  ?Upper chest: Included upper lungs are clear. ?  ?Review of the MIP images confirms the above findings ?  ?CTA HEAD ?  ?Anterior circulation: Intracranial internal carotid arteries are ?patent. Proximal right middle cerebral artery is patent. There is ?occlusion of a right M3 segment in the region of curvilinear ?hyperdensity on noncontrast  CT. Left middle and both anterior ?cerebral arteries are patent. ?  ?Posterior circulation: Intracranial vertebral arteries are patent. ?Basilar artery is patent. Major cerebellar artery origins are ?patent. Posterior cerebral arteries are patent. ?  ?Venous sinuses: Patent as allowed by contrast bolus  timing. ?  ?Review of the MIP images confirms the above findings ?  ?IMPRESSION: ?No acute intracranial hemorrhage. Evolving acute infarction of the ?right MCA territory involving parietal and temporal lobes, in

## 2022-01-23 NOTE — Patient Instructions (Addendum)
Repeat CTA of the head, neck - schedule it for about a month out ?Continue DUAP until we get the results  ? ?Carotid Artery Dissection ?A carotid artery dissection is a tear in a carotid artery. The carotid arteries are blood vessels on each side of the neck. They carry blood from the heart to the brain and other parts of the head. When an artery tears, blood collects inside the layers of the artery wall. This can cause a blood clot. ?This condition increases the risk of a stroke if it is not diagnosed and treated right away. ?What are the causes? ?This condition may be caused by having: ?A neck injury due to sudden or excessive neck movement. This is called a traumatic dissection. ?Weak blood vessel walls. The walls may tear even when no injury occurs (spontaneous dissection). ?An angiogram procedure. This is a rare cause of carotid artery dissection. ?In many cases, the cause of this condition is not known. ?What increases the risk? ?Carotid artery dissection is more likely to develop in people who are 73-55 years old. You may be more likely to have a spontaneous carotid artery dissection if you have: ?High blood pressure. ?A migraine disorder. ?Certain inherited diseases or connective tissue disorders that weaken the blood vessels. ?Fibromuscular dysplasia. ?Had an artery dissection in the past. ?Abused amphetamine drugs. ?What are the signs or symptoms? ?Symptoms of carotid artery dissection are similar to signs of a stroke. Symptoms may include: ?Headache. ?Eye, face, or neck pain. ?Changes in vision. ?Weakness on one side of the face or body. ?Drooping eyelid. ?Loss of taste. ?Ringing in the ear. ?How is this diagnosed? ?This condition is diagnosed with tests, such as: ?CT angiogram. This test uses a computer to take X-rays of your carotid arteries. A dye may be injected into your blood to show the inside of your blood vessels more clearly. ?MRI angiogram. This is a type of MRI that is used to evaluate the  blood vessels. ?Cerebral angiogram. This test uses X-rays and a dye to show the blood vessels in the brain and neck. ?Doppler ultrasound. This test uses sound waves to show the carotid arteries. The test will also show how well blood flows through your arteries. ?How is this treated? ?Treatment for this condition depends on the cause of your carotid artery dissection and your overall health. The most important goal of treatment is to prevent a stroke. If you are having a stroke, it is important to get treatment quickly. The condition may be treated with: ?Blood thinners. This medicine helps to prevent blood clots. This may be given first through an IV, and then as pills for 3-6 months. ?Procedures to widen a narrow blood vessel (angioplasty) or to place a mesh tube (stent) inside the blood vessel to keep it open. ?Surgery to repair the area. This is rarely needed. ?Follow these instructions at home: ?Lifestyle ? ?Work with your health care provider to control your blood pressure. This may involve: ?Exercising regularly, as told by your health care provider. Check with your health care provider before starting any new type of exercise. ?Eating a heart-healthy diet. This includes limiting unhealthy fats and eating more healthy fats. ?Reducing the amount of salt (sodium) that you eat to less than 1,500 mg a day. ?Reducing stress by participating in things that you enjoy and avoiding things that cause you stress. ?Avoid activities that put you at risk for neck injuries, such as contact sports. ?Do not use any products that contain nicotine or  tobacco, such as cigarettes, e-cigarettes, and chewing tobacco. If you need help quitting, ask your health care provider. ?General instructions ?Take over-the-counter and prescription medicines only as told by your health care provider. ?Keep all follow-up visits as told by your health care provider. This is important. ?Get help right away if: ?You feel weak or dizzy. ?You have a  sudden, severe headache with no known cause. ?You notice changes in your vision or speech. ?You have difficulty breathing. ?You have a loss of balance or coordination. ?You have numbness in your face, arm, or leg. ?You have chest pain. ?You have a fever. ?These symptoms may represent a serious problem that is an emergency. Do not wait to see if the symptoms will go away. Get medical help right away. Call your local emergency services (911 in the [place] Do not drive yourself to the hospital. ?Summary ?A carotid artery dissection is a tear in a carotid artery. When an artery tears, blood collects inside the layers of the artery wall. This can cause a blood clot. ?Treatment for this condition depends on the cause of your carotid artery dissection and your overall health. The most important goal of treatment is to prevent a stroke. ?The condition may be treated with blood thinners, procedures to widen a narrow blood vessel, or surgery to repair the area. ?Avoid activities that put you at risk for neck injuries, such as contact sports. ?Take over-the-counter and prescription medicines only as told by your health care provider. ?This information is not intended to replace advice given to you by your health care provider. Make sure you discuss any questions you have with your health care provider. ?Document Revised: 06/20/2018 Document Reviewed: 06/20/2018 ?Elsevier Patient Education ? [place]. ? ?

## 2022-01-25 ENCOUNTER — Ambulatory Visit: Payer: No Typology Code available for payment source | Admitting: Occupational Therapy

## 2022-01-25 ENCOUNTER — Ambulatory Visit: Payer: No Typology Code available for payment source | Admitting: Physical Therapy

## 2022-01-25 DIAGNOSIS — M6281 Muscle weakness (generalized): Secondary | ICD-10-CM

## 2022-01-25 DIAGNOSIS — R296 Repeated falls: Secondary | ICD-10-CM

## 2022-01-25 DIAGNOSIS — R2681 Unsteadiness on feet: Secondary | ICD-10-CM

## 2022-01-25 DIAGNOSIS — I69354 Hemiplegia and hemiparesis following cerebral infarction affecting left non-dominant side: Secondary | ICD-10-CM

## 2022-01-25 DIAGNOSIS — M25631 Stiffness of right wrist, not elsewhere classified: Secondary | ICD-10-CM

## 2022-01-25 NOTE — Therapy (Signed)
[facility], [place], [address] ?Phone: [phone]   Fax:  [phone] ? ?Occupational Therapy Treatment ? ?Patient Details  ?Name: Alice Williams ?MRN: [id number] ?Date of Birth: 01-20-1961 ?Referring Provider (OT): Dr. Sarina Ill ? ? ?Encounter Date: 01/25/2022 ? ? OT End of Session - 01/25/22 1059   ? ? Visit Number 6   ? Number of Visits 25   ? Date for OT Re-Evaluation 03/25/22   ? Authorization Type Focus 2023  copay $40 per visit  No Ded , VL: [place]  Auth Reqd: after 12th visit   ? Authorization - Visit Number 5   corrected count  ? Authorization - Number of Visits 12   ? OT Start Time 1017   ? OT Stop Time 1100   ? OT Time Calculation (min) 43 min   ? Activity Tolerance Patient tolerated treatment well   ? Behavior During Therapy [facility] for tasks assessed/performed   ? ?  ?  ? ?  ? ? ?Past Medical History:  ?Diagnosis Date  ? Anal fissure   ? Anemia   ? in early 20's  ? Anxiety   ? Arthritis   ? back  ? Breast calcifications on mammogram   ? Cancer ([place])   ? pre cancer colon,cervix  ? Colon polyp   ? Dyslipidemia 06/24/2018  ? Elevated cholesterol   ? Facet arthropathy, lumbosacral 08/28/2017  ? Family history of adverse reaction to anesthesia   ? sibling has N/V  ? GERD (gastroesophageal reflux disease)   ? Heart murmur   ? History of colon polyps   ? History of kidney stones   ? has small ones  ? IBS (irritable bowel syndrome)   ? Inappropriate sinus tachycardia   ? Migraines   ? Osteopenia   ? Osteoporosis of femur without pathological fracture 08/28/2017  ? Palpitations   ? PONV (postoperative nausea and vomiting)   ? Premature surgical menopause   ? PTSD (post-traumatic stress disorder)   ? Sleep apnea   ? ? ?Past Surgical History:  ?Procedure Laterality Date  ? ANAL FISSURE REPAIR    ? APPENDECTOMY  2000  ? COLONOSCOPY W/ POLYPECTOMY  03/2015  ? COLONOSCOPY WITH ESOPHAGOGASTRODUODENOSCOPY (EGD)  04/01/2015  ? KNEE SURGERY     ? OOPHORECTOMY    ? OOPHORECTOMY    ? 1995, 1998  ? ORIF ANKLE FRACTURE Right 11/28/2021  ? Procedure: OPEN REDUCTION INTERNAL FIXATION (ORIF) ANKLE FRACTURE;  Surgeon: Meredith Pel, MD;  Location: [place];  Service: Orthopedics;  Laterality: Right;  ? PELVIC LAPAROSCOPY    ? x 4 for endometriosis   ? VAGINAL HYSTERECTOMY  1994  ? ? ?There were no vitals filed for this visit. ? ? Subjective Assessment - 01/25/22 1029   ? ? Subjective  no pain   ? Limitations 01/05/22- received after therapy session was completed - per MD-Okay for AROM and PROM of right wrist. Okay for strengthening exercises in the brace  We will dc brace in ~2 weeks as long as she is doing okay on exam at that point, (pt is cleared for walker use and WB through RLE also)   ? Patient Stated Goals get back to where I was before (return to living alone and go back to work)   ? Currently in Pain? No/denies   ? Pain Onset More than a month ago   ? ?  ?  ? ?  ? ?Fluidotherapy x 10  min. Rt hand to decrease stiffness ? ?Reviewed P/ROM HEP especially for wrist extension.  ?Issued light strengthening wrist HEP w/ 1 lb dumbbell - see pt instructions for details. Pt w/ no pain or adverse reactions ?UBE x 7 min. Level 5 resistance  ? ?Standing at countertop to retrieve cones from shelf and place and lower surface to Rt side to increase weight over RLE reaching slightly outside BOS.  ?Side stepping along counter to Rt and Lt sides- pt very hesitant to put full weight through RLE ? ? ? ? ? ? ? ? ? ? ? ? ? ? ? ? ? ? ? ? ? OT Education - 01/25/22 1039   ? ? Education Details light wrist strengthening HEP   ? Person(s) Educated Patient   ? Methods Explanation;Handout;Verbal cues;Demonstration   ? Comprehension Verbalized understanding;Returned demonstration   ? ?  ?  ? ?  ? ? ? OT Short Term Goals - 01/01/22 1217   ? ?  ? OT SHORT TERM GOAL #1  ? Title Pt will be independent with initial HEP for strength/activity tolerance.--check STGs 01/23/22   ? Time 4   ?  Period Weeks   ? Status On-going   ?  ? OT SHORT TERM GOAL #2  ? Title Pt will be independent with initial HEP for R wrist ROM (once cleared by MD).   ? Time 4   ? Period Weeks   ? Status On-going   ?  ? OT SHORT TERM GOAL #3  ? Title Pt will perform BADLs mod I.   ? Time 4   ? Period Weeks   ? Status New   ?  ? OT SHORT TERM GOAL #4  ? Title Pt will perform simple home maintenance/snack prep with supervision.   ? Time 4   ? Period Weeks   ? Status New   ? ?  ?  ? ?  ? ? ? ? OT Long Term Goals - 12/25/21 1234   ? ?  ? OT LONG TERM GOAL #1  ? Title Pt will be independent with updated strengthening HEP for bilateral UEs/R wrist (once cleared by MD).--check LTGs 03/25/22   ? Time 12   ? Period Weeks   ? Status New   ?  ? OT LONG TERM GOAL #2  ? Title Pt will perform simple clooking and cleaning tasks mod I.   ? Time 12   ? Period Weeks   ? Status New   ?  ? OT LONG TERM GOAL #3  ? Title Pt will be able to complete IADL task for at least 62mn prior to rest break.   ? Time 12   ? Period Weeks   ? Status New   ?  ? OT LONG TERM GOAL #4  ? Title Pt will demo at least 35lbs R grip strength for lifting/carrying objects and opening containers.   ? Time 12   ? Period Weeks   ? Status New   ?  ? OT LONG TERM GOAL #5  ? Title Pt will demo at least 60* wrist flex/ext for ADLs/IADLs.   ? Time 12   ? Period Weeks   ? Status New   ? ?  ?  ? ?  ? ? ? ? ? ? ? ? Plan - 01/25/22 1059   ? ? Clinical Impression Statement Pt progressing towards goals. Initiated light strengthening to wrist today and pt tolerated well   ? OT Occupational  Profile and History Detailed Assessment- Review of Records and additional review of physical, cognitive, psychosocial history related to current functional performance   ? Occupational performance deficits (Please refer to evaluation for details): ADL's;IADL's;Work;Leisure   ? Body Structure / Function / Physical Skills ADL;Decreased knowledge of use of DME;Strength;Balance;UE functional  use;ROM;IADL;Endurance;Mobility   ? Rehab Potential Good   ? Clinical Decision Making Several treatment options, min-mod task modification necessary   ? Comorbidities Affecting Occupational Performance: May have comorbidities impacting occupational performance   ? Modification or Assistance to Complete Evaluation  No modification of tasks or assist necessary to complete eval   ? OT Frequency 2x / week   ? OT Duration 12 weeks   +eval, may modify based on scheduling needs/when precautions are lifted  ? OT Treatment/Interventions Self-care/ADL training;Moist Heat;Fluidtherapy;DME and/or AE instruction;Splinting;Therapeutic activities;Balance training;Contrast Bath;Ultrasound;Therapeutic exercise;Passive range of motion;Functional Mobility Training;Neuromuscular education;Cryotherapy;Electrical Stimulation;Paraffin;Manual Therapy;Patient/family education;Energy conservation   ? Plan continue fluido prn, passive wrist ext and light wrist strengthening (increase to 2 lb weight end of next week), UBE, dynamic standing balance (side stepping along counter and reaching outside BOS)   ? Consulted and Agree with Plan of Care Patient   ? ?  ?  ? ?  ? ? ?Patient will benefit from skilled therapeutic intervention in order to improve the following deficits and impairments:   ?Body Structure / Function / Physical Skills: ADL, Decreased knowledge of use of DME, Strength, Balance, UE functional use, ROM, IADL, Endurance, Mobility ?  ?  ? ? ?Visit Diagnosis: ?Stiffness of right wrist, not elsewhere classified ? ?Hemiplegia and hemiparesis following cerebral infarction affecting left non-dominant side ([place]) ? ?Muscle weakness (generalized) ? ?Unsteadiness on feet ? ? ? ?Problem List ?Patient Active Problem List  ? Diagnosis Date Noted  ? Acute right MCA stroke ([place]) 12/07/2021  ? GERD without esophagitis 12/07/2021  ? Right wrist fracture, with routine healing, subsequent encounter 12/07/2021  ? Bimalleolar ankle fracture, right,  closed, with routine healing, subsequent encounter   ? Blepharospasm 11/08/2021  ? Sleep disorder, circadian, shift work type 08/01/2021  ? Excessive daytime sleepiness 08/01/2021  ? Morning headache 02/16/2021  ? Left eye pain

## 2022-01-25 NOTE — Patient Instructions (Signed)
Wrist Extension: Resisted ? ? ? ?With right palm down, __1__ pound weight in hand, bend wrist up. Return slowly. ?Repeat _10___ times per set.  Do __1-2__ sessions per day. ? ?Wrist Flexion: Resisted ? ? ? ?With right palm up, __1__ pound weight in hand, bend wrist up. Return slowly. ?Repeat __10__ times per set. Do _1-2___ sessions per day. ? ?Wrist Radial Deviation: Resisted ? ? ? ?With right thumb up, __1__ pound weight in hand, bend wrist up. Return slowly. ?Repeat __10__ times per set.  Do __1-2__ sessions per day. ? ? ? ? ? ?

## 2022-01-25 NOTE — Therapy (Signed)
?OUTPATIENT PHYSICAL THERAPY TREATMENT NOTE ? ? ?Patient Name: Alice Williams ?MRN: [id number] ?DOB:[birth year], 61 y.o., female ?Today's Date: 01/25/2022 ? ?PCP: Mackie Pai, PA-C ?REFERRING PROVIDER: Melvenia Beam, MD  ? ? PT End of Session - 01/25/22 1106   ? ? Visit Number 6   ? Number of Visits 25   ? Date for PT Re-Evaluation 03/14/22   ? Authorization Type [facility] ([facility]) Needs auth after 12th visit   ? Authorization - Visit Number 1   ? Authorization - Number of Visits 12   ? PT Start Time 1105   ? PT Stop Time 1148   ? PT Time Calculation (min) 43 min   ? Equipment Utilized During Treatment Gait belt   ? Activity Tolerance Patient tolerated treatment well   Orthostatic  ? Behavior During Therapy [facility] for tasks assessed/performed   ? ?  ?  ? ?  ? ? ? ? ? ?Past Medical History:  ?Diagnosis Date  ? Anal fissure   ? Anemia   ? in early 20's  ? Anxiety   ? Arthritis   ? back  ? Breast calcifications on mammogram   ? Cancer ([place])   ? pre cancer colon,cervix  ? Colon polyp   ? Dyslipidemia 06/24/2018  ? Elevated cholesterol   ? Facet arthropathy, lumbosacral 08/28/2017  ? Family history of adverse reaction to anesthesia   ? sibling has N/V  ? GERD (gastroesophageal reflux disease)   ? Heart murmur   ? History of colon polyps   ? History of kidney stones   ? has small ones  ? IBS (irritable bowel syndrome)   ? Inappropriate sinus tachycardia   ? Migraines   ? Osteopenia   ? Osteoporosis of femur without pathological fracture 08/28/2017  ? Palpitations   ? PONV (postoperative nausea and vomiting)   ? Premature surgical menopause   ? PTSD (post-traumatic stress disorder)   ? Sleep apnea   ? ?Past Surgical History:  ?Procedure Laterality Date  ? ANAL FISSURE REPAIR    ? APPENDECTOMY  2000  ? COLONOSCOPY W/ POLYPECTOMY  03/2015  ? COLONOSCOPY WITH ESOPHAGOGASTRODUODENOSCOPY (EGD)  04/01/2015  ? KNEE SURGERY    ? OOPHORECTOMY    ? OOPHORECTOMY    ? 1995, 1998  ? ORIF ANKLE FRACTURE Right 11/28/2021  ? Procedure: OPEN  REDUCTION INTERNAL FIXATION (ORIF) ANKLE FRACTURE;  Surgeon: Meredith Pel, MD;  Location: [place];  Service: Orthopedics;  Laterality: Right;  ? PELVIC LAPAROSCOPY    ? x 4 for endometriosis   ? VAGINAL HYSTERECTOMY  1994  ? ?Patient Active Problem List  ? Diagnosis Date Noted  ? Acute right MCA stroke ([place]) 12/07/2021  ? GERD without esophagitis 12/07/2021  ? Right wrist fracture, with routine healing, subsequent encounter 12/07/2021  ? Bimalleolar ankle fracture, right, closed, with routine healing, subsequent encounter   ? Blepharospasm 11/08/2021  ? Sleep disorder, circadian, shift work type 08/01/2021  ? Excessive daytime sleepiness 08/01/2021  ? Morning headache 02/16/2021  ? Left eye pain 02/16/2021  ? Intractable episodic cluster headache 02/16/2021  ? Behavioral insomnia of childhood 02/16/2021  ? Retrognathia 02/16/2021  ? Chronic migraine without aura, with intractable migraine, so stated, with status migrainosus 01/12/2021  ? Muscle spasm 10/14/2020  ? Vitamin D deficiency 08/15/2020  ? Acute bilateral low back pain with bilateral sciatica 07/13/2020  ? Gastric polyp 02/11/2019  ? Sessile colonic polyp 02/11/2019  ? Chronic fatigue 02/11/2019  ? Grade I internal hemorrhoids 02/11/2019  ?  Ductal hyperplasia of breast 08/21/2018  ? Cognitive changes 06/24/2018  ? Dyslipidemia 06/24/2018  ? Dense breast tissue on mammogram 04/17/2018  ? Fibrocystic breast determined by biopsy 04/09/2018  ? Family history of breast cancer in mother 04/09/2018  ? Paroxysmal sinus tachycardia ([place]) 03/27/2018  ? Osteoporosis 08/28/2017  ? Facet arthropathy, lumbosacral 08/28/2017  ? Chronic left SI joint pain 08/27/2017  ? Left lumbar radiculopathy 08/27/2017  ? Allergic rhinitis 08/09/2017  ? Disorder of lipoid metabolism 08/09/2017  ? Insomnia secondary to anxiety 08/09/2017  ? Postmenopausal osteoporosis 08/09/2017  ? History of hysterectomy for benign disease 08/09/2017  ? GAD (generalized anxiety disorder) 08/09/2017   ? Former smoker, stopped smoking in distant past 08/09/2017  ? Minor depressive disorder 08/09/2017  ? Palpitations 03/16/2015  ? Family history of colon cancer 03/02/2015  ? Hiatal hernia with GERD without esophagitis 03/02/2015  ? History of adenomatous polyp of colon 03/02/2015  ? Chronic migraine without aura 10/10/2014  ? History of cardiovascular disorder 01/18/2003  ? ? ?REFERRING DIAG: R53.1 (ICD-10-CM) - Left-sided weakness R41.4 (ICD-10-CM) - Left-sided neglect I63.411 (ICD-10-CM) - Cerebrovascular accident (CVA) due to embolism of right middle cerebral artery (HCC) I63.9,I77.70 (ICD-10-CM) - Cerebral ischemic stroke due to arterial dissection (HCC)  ? ?THERAPY DIAG:  ?Muscle weakness (generalized) ? ?Unsteadiness on feet ? ?Repeated falls ? ?PERTINENT HISTORY: SVT, migraine headaches, PTSD, anxiety, irritable bowel syndrome, GERD, osteoporosis, sleep apnea, left sciatica.  ? ?PRECAUTIONS: Fall, L hemi, Full weightbearing R wrist, WBAT RLE  ? ?SUBJECTIVE: "My ankle feels weird, it is like I can feel the bone". Pt ambulated into clinic with RW, wearing tennis shoes. Pt reports sh does not feel like herself, "I feel like I have a flat affect"  ? ?PAIN:  ?Are you having pain? No ?OBJECTIVE ? ?TODAY'S TREATMENT:  ?  Pt wore compression sock on BLEs throughout session  ? ?Ther Ex   ?NuStep level 4 for 8 minutes with BLE/BUEs for dynamic cardiovascular warmup and BLE strength. Min cues to maintain steps/min >55.  ? ?Anterior hurdle navigation w/counter support and HHA (min A) for improved step clearance and weightbearing tolerance of RLE. Stepped over 4 hurdles x4 sets. Pt initially demonstrated fear-avoidance behavior and heavy reliance on counter but quickly relaxed and demonstrated upright posture and improved weightbearing on RLE.  ? ?Gait training  ? ? ?Gait pattern: step to pattern, decreased step length- Left, decreased stance time- Right, decreased hip/knee flexion- Left, decreased ankle dorsiflexion-  Left, knee flexed in stance- Right, shuffling, and poor foot clearance- Left ?Distance walked: 230' ?Assistive device utilized: Environmental consultant - 2 wheeled ?Level of assistance: SBA ?Comments: Pt ambulating with decreased cadence and heavy reliance on BUEs on RW. Mod verbal cues for increased step length/clearance of LLE and pt able to progress to step-through pattern.  ? ?Gait pattern: step to pattern, decreased step length- Left, decreased stance time- Right, decreased stride length, decreased ankle dorsiflexion- Right, Left foot flat, knee flexed in stance- Right, antalgic, and poor foot clearance- Left ?Distance walked: 150' ?Assistive device utilized:  LBQD ?Level of assistance: CGA ?Comments: Pt able to walk w/LBQD in LUE well, demonstrated increased weightbearing on RLE and progression towards step-through pattern w/min cues. Pt kept RUE braced against therapist but by end of walk began to demonstrate minor arm swing.  ? ? ? ?PATIENT EDUCATION: ?Education details: Continuing HEP, add lateral stepping and sit <>stands with unilateral support  ?Person educated: Patient ?Education method: Explanation and Handouts ?Education comprehension: verbalized understanding and needs  further education ? ? ?HOME EXERCISE PROGRAM: ?Access Code: [email or web address] ?URL: [email or web address] ?Date: 01/18/2022 ?Prepared by: Mickie Bail Dynasti Kerman ? ?Exercises ?- Standing March with Counter Support  - 1 x daily - 7 x weekly - 3 sets - 10 reps ?- Proper Sit to Stand Technique  - 1 x daily - 7 x weekly - 3 sets - 10 reps ?- Calf stretch  - 1 x daily - 7 x weekly - 3 sets - 10 reps - 30s hold ?- Heel Raises with Counter Support  - 1 x daily - 7 x weekly - 3 sets - 10 reps - 3s hold ? ? ? ? PT Short Term Goals - 01/09/22 1210   ? ?  ? PT SHORT TERM GOAL #1  ? Title Pt will be independent and compliant with initial HEP in order to improve functional mobility.  (Target Date: 01/13/22)   ? Time 4   ? Period Weeks   ? Status Achieved   ? Target Date  01/13/22   ?  ? PT SHORT TERM GOAL #2  ? Title Pt will perform lateral scoot transfers both directions at min A level maintaining NWB precautions without cues.   ? Time 4   ? Period Weeks   ? Status Achie

## 2022-01-29 ENCOUNTER — Ambulatory Visit: Payer: No Typology Code available for payment source

## 2022-01-29 ENCOUNTER — Ambulatory Visit: Payer: No Typology Code available for payment source | Attending: Neurology | Admitting: Physical Therapy

## 2022-01-29 ENCOUNTER — Ambulatory Visit: Payer: No Typology Code available for payment source | Admitting: Occupational Therapy

## 2022-01-29 ENCOUNTER — Other Ambulatory Visit: Payer: Self-pay | Admitting: Medical

## 2022-01-29 ENCOUNTER — Other Ambulatory Visit (HOSPITAL_BASED_OUTPATIENT_CLINIC_OR_DEPARTMENT_OTHER): Payer: Self-pay

## 2022-01-29 DIAGNOSIS — M25631 Stiffness of right wrist, not elsewhere classified: Secondary | ICD-10-CM

## 2022-01-29 DIAGNOSIS — M6281 Muscle weakness (generalized): Secondary | ICD-10-CM | POA: Diagnosis not present

## 2022-01-29 DIAGNOSIS — R471 Dysarthria and anarthria: Secondary | ICD-10-CM

## 2022-01-29 DIAGNOSIS — R2681 Unsteadiness on feet: Secondary | ICD-10-CM | POA: Diagnosis present

## 2022-01-29 DIAGNOSIS — R2689 Other abnormalities of gait and mobility: Secondary | ICD-10-CM | POA: Diagnosis present

## 2022-01-29 DIAGNOSIS — I69354 Hemiplegia and hemiparesis following cerebral infarction affecting left non-dominant side: Secondary | ICD-10-CM | POA: Insufficient documentation

## 2022-01-29 DIAGNOSIS — R296 Repeated falls: Secondary | ICD-10-CM | POA: Diagnosis present

## 2022-01-29 MED ORDER — ESCITALOPRAM OXALATE 10 MG PO TABS
ORAL_TABLET | Freq: Every day | ORAL | 1 refills | Status: DC
  Filled 2022-01-29: qty 90, 90d supply, fill #0
  Filled 2022-04-27: qty 90, 90d supply, fill #1

## 2022-01-29 NOTE — Therapy (Signed)
[facility] ?[address] ?[place], [place], [address] ?Phone: [phone]   Fax:  [phone] ? ?Occupational Therapy Treatment ? ?Patient Details  ?Name: Alice Williams ?MRN: [id number] ?Date of Birth: 1960-11-24 ?Referring Provider (OT): Dr. Sarina Ill ? ? ?Encounter Date: 01/29/2022 ? ? OT End of Session - 01/29/22 1113   ? ? Visit Number 7   ? Number of Visits 25   ? Date for OT Re-Evaluation 03/25/22   ? Authorization Type [facility] 2023  copay $40 per visit  No Ded , VL: [place]  Auth Reqd: after 12th visit   ? Authorization - Visit Number 6   corrected count  ? Authorization - Number of Visits 12   ? OT Start Time 1105   ? OT Stop Time 1145   ? OT Time Calculation (min) 40 min   ? Activity Tolerance Patient tolerated treatment well   ? Behavior During Therapy [facility] for tasks assessed/performed   ? ?  ?  ? ?  ? ? ?Past Medical History:  ?Diagnosis Date  ? Anal fissure   ? Anemia   ? in early 20's  ? Anxiety   ? Arthritis   ? back  ? Breast calcifications on mammogram   ? Cancer ([place])   ? pre cancer colon,cervix  ? Colon polyp   ? Dyslipidemia 06/24/2018  ? Elevated cholesterol   ? Facet arthropathy, lumbosacral 08/28/2017  ? Family history of adverse reaction to anesthesia   ? sibling has N/V  ? GERD (gastroesophageal reflux disease)   ? Heart murmur   ? History of colon polyps   ? History of kidney stones   ? has small ones  ? IBS (irritable bowel syndrome)   ? Inappropriate sinus tachycardia   ? Migraines   ? Osteopenia   ? Osteoporosis of femur without pathological fracture 08/28/2017  ? Palpitations   ? PONV (postoperative nausea and vomiting)   ? Premature surgical menopause   ? PTSD (post-traumatic stress disorder)   ? Sleep apnea   ? ? ?Past Surgical History:  ?Procedure Laterality Date  ? ANAL FISSURE REPAIR    ? APPENDECTOMY  2000  ? COLONOSCOPY W/ POLYPECTOMY  03/2015  ? COLONOSCOPY WITH ESOPHAGOGASTRODUODENOSCOPY (EGD)  04/01/2015  ? KNEE SURGERY    ?  OOPHORECTOMY    ? OOPHORECTOMY    ? 1995, 1998  ? ORIF ANKLE FRACTURE Right 11/28/2021  ? Procedure: OPEN REDUCTION INTERNAL FIXATION (ORIF) ANKLE FRACTURE;  Surgeon: Meredith Pel, MD;  Location: [place];  Service: Orthopedics;  Laterality: Right;  ? PELVIC LAPAROSCOPY    ? x 4 for endometriosis   ? VAGINAL HYSTERECTOMY  1994  ? ? ?There were no vitals filed for this visit. ? ? Subjective Assessment - 01/29/22 1114   ? ? Subjective  I squeezed my shampoo bottle, washed my hair, and wrung out my washcloth and it didn't bother me with my Rt hand, and it usually does.   ? Limitations 01/05/22- received after therapy session was completed - per MD-Okay for AROM and PROM of right wrist. Okay for strengthening exercises in the brace  We will dc brace in ~2 weeks as long as she is doing okay on exam at that point, (pt is cleared for walker use and WB through RLE also)   ? Patient Stated Goals get back to where I was before (return to living alone and go back to work)   ? Currently in Pain? No/denies   ?  Pain Onset More than a month ago   ? ?  ?  ? ?  ? ? ?Fluidotherapy x 12 min RT hand/wrist to decrease stiffness ? ?Assessed progress towards goals - see below for ROM measurements and grip strength. Side stepping along counter to RT and LT sides x 2  ? ?Issued putty HEP - see pt instructions for details. Issued yellow putty ? ?Reviewed wrist strengthening HEP w/ 1 lb dumbbell - pt did wrist flex, ext, and RD x 10 reps each ? ?UBE x 5 min. Level 6 resistance for UB strength/endurance ? ? ? ? ? ? ? ? ? ? ? ? ? ? ? ? ? ? ? ? OT Education - 01/29/22 1125   ? ? Education Details putty HEP   ? Person(s) Educated Patient   ? Methods Explanation;Handout;Verbal cues;Demonstration   ? Comprehension Verbalized understanding;Returned demonstration   ? ?  ?  ? ?  ? ? ? OT Short Term Goals - 01/29/22 1115   ? ?  ? OT SHORT TERM GOAL #1  ? Title Pt will be independent with initial HEP for strength/activity tolerance.--check STGs  01/23/22   ? Time 4   ? Period Weeks   ? Status Achieved   ?  ? OT SHORT TERM GOAL #2  ? Title Pt will be independent with initial HEP for R wrist ROM (once cleared by MD).   ? Time 4   ? Period Weeks   ? Status Achieved   ?  ? OT SHORT TERM GOAL #3  ? Title Pt will perform BADLs mod I.   ? Time 4   ? Period Weeks   ? Status Achieved   ?  ? OT SHORT TERM GOAL #4  ? Title Pt will perform simple home maintenance/snack prep with supervision.   ? Time 4   ? Period Weeks   ? Status On-going   ? ?  ?  ? ?  ? ? ? ? OT Long Term Goals - 01/29/22 1117   ? ?  ? OT LONG TERM GOAL #1  ? Title Pt will be independent with updated strengthening HEP for bilateral UEs/R wrist (once cleared by MD).--check LTGs 03/25/22   ? Time 12   ? Period Weeks   ? Status On-going   ?  ? OT LONG TERM GOAL #2  ? Title Pt will perform simple clooking and cleaning tasks mod I.   ? Time 12   ? Period Weeks   ? Status New   ?  ? OT LONG TERM GOAL #3  ? Title Pt will be able to complete IADL task for at least 46mn prior to rest break.   ? Time 12   ? Period Weeks   ? Status New   ?  ? OT LONG TERM GOAL #4  ? Title Pt will demo at least 35lbs R grip strength for lifting/carrying objects and opening containers.   ? Time 12   ? Period Weeks   ? Status On-going   01/29/22: Rt = 19.1 lbs, Lt = 44.7 lbs  ?  ? OT LONG TERM GOAL #5  ? Title Pt will demo at least 60* wrist flex/ext for ADLs/IADLs.   ? Time 12   ? Period Weeks   ? Status Achieved   flex = 65*, ext = 60*  ? ?  ?  ? ?  ? ? ? ? ? ? ? ? Plan - 01/29/22 1127   ? ?  Clinical Impression Statement Continued light strengthening to wrist and added putty HEP today. Pt progressing towards goals and has met most STG's and 1 LTG   ? OT Occupational Profile and History Detailed Assessment- Review of Records and additional review of physical, cognitive, psychosocial history related to current functional performance   ? Occupational performance deficits (Please refer to evaluation for details):  ADL's;IADL's;Work;Leisure   ? Body Structure / Function / Physical Skills ADL;Decreased knowledge of use of DME;Strength;Balance;UE functional use;ROM;IADL;Endurance;Mobility   ? Rehab Potential Good   ? Clinical Decision Making Several treatment options, min-mod task modification necessary   ? Comorbidities Affecting Occupational Performance: May have comorbidities impacting occupational performance   ? Modification or Assistance to Complete Evaluation  No modification of tasks or assist necessary to complete eval   ? OT Frequency 2x / week   ? OT Duration 12 weeks   +eval, may modify based on scheduling needs/when precautions are lifted  ? OT Treatment/Interventions Self-care/ADL training;Moist Heat;Fluidtherapy;DME and/or AE instruction;Splinting;Therapeutic activities;Balance training;Contrast Bath;Ultrasound;Therapeutic exercise;Passive range of motion;Functional Mobility Training;Neuromuscular education;Cryotherapy;Electrical Stimulation;Paraffin;Manual Therapy;Patient/family education;Energy conservation   ? Plan continue fluido prn, passive wrist ext and light wrist strengthening - increase to 2 lb weight, gripper UBE, dynamic standing balance (side stepping along counter and reaching outside BOS)   ? Consulted and Agree with Plan of Care Patient   ? ?  ?  ? ?  ? ? ?Patient will benefit from skilled therapeutic intervention in order to improve the following deficits and impairments:   ?Body Structure / Function / Physical Skills: ADL, Decreased knowledge of use of DME, Strength, Balance, UE functional use, ROM, IADL, Endurance, Mobility ?  ?  ? ? ?Visit Diagnosis: ?Muscle weakness (generalized) ? ?Stiffness of right wrist, not elsewhere classified ? ?Unsteadiness on feet ? ? ? ?Problem List ?Patient Active Problem List  ? Diagnosis Date Noted  ? Acute right MCA stroke ([place]) 12/07/2021  ? GERD without esophagitis 12/07/2021  ? Right wrist fracture, with routine healing, subsequent encounter 12/07/2021  ?  Bimalleolar ankle fracture, right, closed, with routine healing, subsequent encounter   ? Blepharospasm 11/08/2021  ? Sleep disorder, circadian, shift work type 08/01/2021  ? Excessive daytime sleepiness 08/01/2021  ? Morning heada

## 2022-01-29 NOTE — Patient Instructions (Signed)
Daily speech exercises:  ? ?Straw exercises: blow through straw in/out water, hum, pitch glides (low to up to low), accent (woo/woo), part of song  ? ?May--Me--My--Moe--Moo x5. Big breath, be LOUD, stay energized  ? ?Counting 1-10 x3. Big breath, be LOUD, stay energized ? ?Read these sentences: ?Good morning.  ?What's wrong? ?I am feeling fine.  ?See you tomorrow.  ?How is the weather? ?It is time to go.  ?Answer the phone.  ?Thank you so much.  ?I am better than before.  ?Are you ready? ?

## 2022-01-29 NOTE — Therapy (Signed)
?OUTPATIENT PHYSICAL THERAPY TREATMENT NOTE ? ? ?Patient Name: Alice Williams ?MRN: [id number] ?DOB:Dec 21, 1960, 61 y.o., female ?Today's Date: 01/29/2022 ? ?PCP: Mackie Pai, PA-C ?REFERRING PROVIDER: Melvenia Beam, MD  ? ? PT End of Session - 01/29/22 1022   ? ? Visit Number 7   ? Number of Visits 25   ? Date for PT Re-Evaluation 03/14/22   ? Authorization Type [facility] Needs auth after 12th visit   ? Authorization - Visit Number 1   ? Authorization - Number of Visits 12   ? PT Start Time 1019   ? PT Stop Time 1105   ? PT Time Calculation (min) 46 min   ? Equipment Utilized During Treatment Gait belt   ? Activity Tolerance Patient tolerated treatment well   Orthostatic  ? Behavior During Therapy [facility] for tasks assessed/performed   ? ?  ?  ? ?  ? ? ? ? ? ? ?Past Medical History:  ?Diagnosis Date  ? Anal fissure   ? Anemia   ? in early 20's  ? Anxiety   ? Arthritis   ? back  ? Breast calcifications on mammogram   ? Cancer ([place])   ? pre cancer colon,cervix  ? Colon polyp   ? Dyslipidemia 06/24/2018  ? Elevated cholesterol   ? Facet arthropathy, lumbosacral 08/28/2017  ? Family history of adverse reaction to anesthesia   ? sibling has N/V  ? GERD (gastroesophageal reflux disease)   ? Heart murmur   ? History of colon polyps   ? History of kidney stones   ? has small ones  ? IBS (irritable bowel syndrome)   ? Inappropriate sinus tachycardia   ? Migraines   ? Osteopenia   ? Osteoporosis of femur without pathological fracture 08/28/2017  ? Palpitations   ? PONV (postoperative nausea and vomiting)   ? Premature surgical menopause   ? PTSD (post-traumatic stress disorder)   ? Sleep apnea   ? ?Past Surgical History:  ?Procedure Laterality Date  ? ANAL FISSURE REPAIR    ? APPENDECTOMY  2000  ? COLONOSCOPY W/ POLYPECTOMY  03/2015  ? COLONOSCOPY WITH ESOPHAGOGASTRODUODENOSCOPY (EGD)  04/01/2015  ? KNEE SURGERY    ? OOPHORECTOMY    ? OOPHORECTOMY    ? 1995, 1998  ? ORIF ANKLE FRACTURE Right 11/28/2021  ? Procedure: OPEN  REDUCTION INTERNAL FIXATION (ORIF) ANKLE FRACTURE;  Surgeon: Meredith Pel, MD;  Location: [facility];  Service: Orthopedics;  Laterality: Right;  ? PELVIC LAPAROSCOPY    ? x 4 for endometriosis   ? VAGINAL HYSTERECTOMY  1994  ? ?Patient Active Problem List  ? Diagnosis Date Noted  ? Acute right MCA stroke ([place]) 12/07/2021  ? GERD without esophagitis 12/07/2021  ? Right wrist fracture, with routine healing, subsequent encounter 12/07/2021  ? Bimalleolar ankle fracture, right, closed, with routine healing, subsequent encounter   ? Blepharospasm 11/08/2021  ? Sleep disorder, circadian, shift work type 08/01/2021  ? Excessive daytime sleepiness 08/01/2021  ? Morning headache 02/16/2021  ? Left eye pain 02/16/2021  ? Intractable episodic cluster headache 02/16/2021  ? Behavioral insomnia of childhood 02/16/2021  ? Retrognathia 02/16/2021  ? Chronic migraine without aura, with intractable migraine, so stated, with status migrainosus 01/12/2021  ? Muscle spasm 10/14/2020  ? Vitamin D deficiency 08/15/2020  ? Acute bilateral low back pain with bilateral sciatica 07/13/2020  ? Gastric polyp 02/11/2019  ? Sessile colonic polyp 02/11/2019  ? Chronic fatigue 02/11/2019  ? Grade I internal hemorrhoids  02/11/2019  ? Ductal hyperplasia of breast 08/21/2018  ? Cognitive changes 06/24/2018  ? Dyslipidemia 06/24/2018  ? Dense breast tissue on mammogram 04/17/2018  ? Fibrocystic breast determined by biopsy 04/09/2018  ? Family history of breast cancer in mother 04/09/2018  ? Paroxysmal sinus tachycardia ([place]) 03/27/2018  ? Osteoporosis 08/28/2017  ? Facet arthropathy, lumbosacral 08/28/2017  ? Chronic left SI joint pain 08/27/2017  ? Left lumbar radiculopathy 08/27/2017  ? Allergic rhinitis 08/09/2017  ? Disorder of lipoid metabolism 08/09/2017  ? Insomnia secondary to anxiety 08/09/2017  ? Postmenopausal osteoporosis 08/09/2017  ? History of hysterectomy for benign disease 08/09/2017  ? GAD (generalized anxiety disorder) 08/09/2017   ? Former smoker, stopped smoking in distant past 08/09/2017  ? Minor depressive disorder 08/09/2017  ? Palpitations 03/16/2015  ? Family history of colon cancer 03/02/2015  ? Hiatal hernia with GERD without esophagitis 03/02/2015  ? History of adenomatous polyp of colon 03/02/2015  ? Chronic migraine without aura 10/10/2014  ? History of cardiovascular disorder 01/18/2003  ? ? ?REFERRING DIAG: R53.1 (ICD-10-CM) - Left-sided weakness R41.4 (ICD-10-CM) - Left-sided neglect I63.411 (ICD-10-CM) - Cerebrovascular accident (CVA) due to embolism of right middle cerebral artery (HCC) I63.9,I77.70 (ICD-10-CM) - Cerebral ischemic stroke due to arterial dissection (HCC)  ? ?THERAPY DIAG:  ?Muscle weakness (generalized) ? ?Unsteadiness on feet ? ?Repeated falls ? ?PERTINENT HISTORY: SVT, migraine headaches, PTSD, anxiety, irritable bowel syndrome, GERD, osteoporosis, sleep apnea, left sciatica.  ? ?PRECAUTIONS: Fall, L hemi, Full weightbearing R wrist, WBAT RLE  ? ?SUBJECTIVE: Pt reports she "feels good", washed her hair by herself for the first time since her stroke this weekend. No falls  ? ?PAIN:  ?Are you having pain? No ?OBJECTIVE ? ?TODAY'S TREATMENT:  ?  Pt wore compression sock on BLEs throughout session  ? ?Gait Training ?In // bars for improved lateral weighshifting, RLE weightbearing tolerance and midline orientation: ?-Pt ambulated multiple laps in // bars with mix of bilat and unilateral (LUE) support to practice step-through gait pattern, midline orientation and R arm swing. Mod multimodal cues to shift weight to R side as pt demonstrated L trunk lean and poor LLE foot clearance throughout. Encouraged pt to watch herself in mirror for midline orientation and weight shift assistance.  ?-Progressed to fwd/retro gait w/intermittent BUE support, min A for lateral weight shift assistance to R side and mod verbal cues for wider stance and reduced lean to L side. Pt using mirror throughout for biofeedback on trunk  position. Pt demonstrates L hip flexor weakness and has difficulty clearing LLE during swing phase. Encouraged pt to begin steppage gait pattern to over exaggerate hip/knee flexion of LLE.  ?-Pt required intermittent seated rest breaks in between trials 2/2 dizziness, vitals not assessed due to pt request  ? ? ?Gait pattern: step to pattern, decreased step length- Left, decreased stance time- Right, decreased hip/knee flexion- Left, decreased ankle dorsiflexion- Left, knee flexed in stance- Right, shuffling, and poor foot clearance- Left ?Distance walked: 230' ?Assistive device utilized: Environmental consultant - 2 wheeled ?Level of assistance: SBA ?Comments: Pt ambulating into clinic w/RW and demonstrated improved step-through pattern and weightshift to L side with reduced reliance on BUEs  ? ? ? ?PATIENT EDUCATION: ?Education details: Continuing HEP, add walking fwd/retro at counter w/steppage pattern for improved L hip flexor strength  ?Person educated: Patient ?Education method: Explanation and Handouts ?Education comprehension: verbalized understanding and needs further education ? ? ?HOME EXERCISE PROGRAM: ?Access Code: [email or web address] ?URL: [email or web address] ?Date: 01/18/2022 ?Prepared by: Mickie Bail Shawne Bulow ? ?  Exercises ?- Standing March with Counter Support  - 1 x daily - 7 x weekly - 3 sets - 10 reps ?- Proper Sit to Stand Technique  - 1 x daily - 7 x weekly - 3 sets - 10 reps ?- Calf stretch  - 1 x daily - 7 x weekly - 3 sets - 10 reps - 30s hold ?- Heel Raises with Counter Support  - 1 x daily - 7 x weekly - 3 sets - 10 reps - 3s hold ? ? ? ? PT Short Term Goals - 01/09/22 1210   ? ?  ? PT SHORT TERM GOAL #1  ? Title Pt will be independent and compliant with initial HEP in order to improve functional mobility.  (Target Date: 01/13/22)   ? Time 4   ? Period Weeks   ? Status Achieved   ? Target Date 01/13/22   ?  ? PT SHORT TERM GOAL #2  ? Title Pt will perform lateral scoot transfers both directions at min A level  maintaining NWB precautions without cues.   ? Time 4   ? Period Weeks   ? Status Achieved   ?  ? PT SHORT TERM GOAL #3  ? Title Will assess and address any vertigo that may be present in order to dec dizziness

## 2022-01-29 NOTE — Patient Instructions (Signed)
1. Grip Strengthening (Resistive Putty) ? ? ?Squeeze putty using thumb and all fingers. ?Repeat _20___ times. Do __2__ sessions per day. ? ? ?2. Roll putty into tube on table and pinch between first two fingers and thumb x 10 reps. Do 2 sessions per day ? ? ? ? ? ?

## 2022-01-29 NOTE — Therapy (Signed)
[facility] ?[place], [place], [address] ?Phone: [phone]   Fax:  [phone] ? ?Speech Language Pathology Treatment ? ?Patient Details  ?Name: Alice Williams ?MRN: [id number] ?Date of Birth: 1961/05/19 ?Referring Provider (SLP): Dr. Rosalin Hawking ? ? ?Encounter Date: 01/29/2022 ? ? End of Session - 01/29/22 1131   ? ? Visit Number 6   ? Number of Visits 25   ? Date for SLP Re-Evaluation 03/23/22   ? Authorization Type needs auth after 12 visits per discipline   ? Authorization - Visit Number 5   ? Authorization - Number of Visits 12   ? SLP Start Time 1149   ? SLP Stop Time  1230   ? SLP Time Calculation (min) 41 min   ? Activity Tolerance Patient tolerated treatment well   ? ?  ?  ? ?  ? ? ?Past Medical History:  ?Diagnosis Date  ? Anal fissure   ? Anemia   ? in early 20's  ? Anxiety   ? Arthritis   ? back  ? Breast calcifications on mammogram   ? Cancer ([place])   ? pre cancer colon,cervix  ? Colon polyp   ? Dyslipidemia 06/24/2018  ? Elevated cholesterol   ? Facet arthropathy, lumbosacral 08/28/2017  ? Family history of adverse reaction to anesthesia   ? sibling has N/V  ? GERD (gastroesophageal reflux disease)   ? Heart murmur   ? History of colon polyps   ? History of kidney stones   ? has small ones  ? IBS (irritable bowel syndrome)   ? Inappropriate sinus tachycardia   ? Migraines   ? Osteopenia   ? Osteoporosis of femur without pathological fracture 08/28/2017  ? Palpitations   ? PONV (postoperative nausea and vomiting)   ? Premature surgical menopause   ? PTSD (post-traumatic stress disorder)   ? Sleep apnea   ? ? ?Past Surgical History:  ?Procedure Laterality Date  ? ANAL FISSURE REPAIR    ? APPENDECTOMY  2000  ? COLONOSCOPY W/ POLYPECTOMY  03/2015  ? COLONOSCOPY WITH ESOPHAGOGASTRODUODENOSCOPY (EGD)  04/01/2015  ? KNEE SURGERY    ? OOPHORECTOMY    ? OOPHORECTOMY    ? 1995, 1998  ? ORIF ANKLE FRACTURE Right 11/28/2021  ? Procedure: OPEN  REDUCTION INTERNAL FIXATION (ORIF) ANKLE FRACTURE;  Surgeon: Meredith Pel, MD;  Location: [facility];  Service: Orthopedics;  Laterality: Right;  ? PELVIC LAPAROSCOPY    ? x 4 for endometriosis   ? VAGINAL HYSTERECTOMY  1994  ? ? ?There were no vitals filed for this visit. ? ? Subjective Assessment - 01/29/22 1147   ? ? Subjective "I was feeling sick"   ? Currently in Pain? No/denies   ? ?  ?  ? ?  ? ? ? ? ? ? ? ? ADULT SLP TREATMENT - 01/29/22 1131   ? ?  ? General Information  ? Behavior/Cognition Alert;Cooperative;Pleasant mood   ?  ? Treatment Provided  ? Treatment provided Cognitive-Linquistic   ?  ? Cognitive-Linquistic Treatment  ? Treatment focused on Dysarthria;Patient/family/caregiver education   ? Skilled Treatment Pt reports good speech/voicing "comes and goes" related to raspiness. Pt did participate in extended conversation with friend where pt reported speech sounded improved and listener heard everything. Inconsistent carryover of HEP reported, with handout and recommendations provided to maximize outcomes. SLP instructed SOVT exercises as vocal warm up and components of Speak Out!/Phorte to target exuberant voice due to  decreased volume and intensity. Pt able to achieve WNL voice with ~80% of structured tasks today with intermittent SLP cued and modeling.   ?  ? Assessment / Recommendations / Plan  ? Plan Continue with current plan of care   ?  ? Progression Toward Goals  ? Progression toward goals Progressing toward goals   ? ?  ?  ? ?  ? ? ? SLP Education - 01/29/22 1243   ? ? Education Details HEP, targeted exercises   ? Person(s) Educated Patient   ? Methods Explanation;Demonstration;Handout   ? Comprehension Verbalized understanding;Returned demonstration;Need further instruction   ? ?  ?  ? ?  ? ? ? SLP Short Term Goals - 01/29/22 1132   ? ?  ? SLP SHORT TERM GOAL #1  ? Title Pt will complete loud AH with average of 85dB over 2 sessions with rare min A   ? Baseline 01-01-22, 01-18-22   ? Status  Achieved   ?  ? SLP SHORT TERM GOAL #2  ? Title Pt will complete HEP for voice/dysarthria (PHoRTE) with rare min A over 2 sessions   ? Baseline 01-18-22   ? Time 1   ? Period Weeks   ? Status On-going   ?  ? SLP SHORT TERM GOAL #3  ? Title Pt will average 72dB 18/20 sentences in structured speech tasks   ? Baseline 01-01-22, 01-18-22   ? Status Achieved   ?  ? SLP SHORT TERM GOAL #4  ? Title Pt will maintain average of 70dB and clear phonation over 8 minute conversation with occasional min A   ? Time 1   ? Period Weeks   ? Status On-going   ? ?  ?  ? ?  ? ? ? SLP Long Term Goals - 01/29/22 1132   ? ?  ? SLP LONG TERM GOAL #1  ? Title Pt will complete HEP for dysarthria with mod I   ? Time 9   ? Period Weeks   ? Status On-going   ?  ? SLP LONG TERM GOAL #2  ? Title Pt will self correct low, hoarse voice with rare min A as needed over 2 sessions   ? Time 9   ? Period Weeks   ? Status On-going   ?  ? SLP LONG TERM GOAL #3  ? Title Pt will average 70dB over 15 minute conversation with rare min A   ? Time 9   ? Period Weeks   ? Status On-going   ?  ? SLP LONG TERM GOAL #4  ? Title Pt will be 100% intelligible in noisy environment with rare min A over 15 minute conversation   ? Time 9   ? Period Weeks   ? Status On-going   ?  ? SLP LONG TERM GOAL #5  ? Title Pt will improve score on Voice Related Quality of Life Measure (VRQOL) raw score by 2 points   ? Baseline raw score - 20; calculated score - 75   ? Time 9   ? Period Weeks   ? Status On-going   ? ?  ?  ? ?  ? ? ? Plan - 01/29/22 1132   ? ? Clinical Impression Statement Today, Treva presents with mild hypokinetic dysarthria c/b low volume and occasional roughness/hoarseness still present. SLP educated and instructed SOVTE, PhoRTE, and Speak Out! exercises to optimize vocal quality and reduce tension/roughness, with good intermittent benefit. Improving awareness and carryover of  forward focused phonation and intentional voicing exhibited on structured speech tasks. Pt  would benefit from skilled ST intervention to optimize speech intelligilbility and communication effectiveness.   ? Speech Therapy Frequency 2x / week   ? Duration 12 weeks   ? Treatment/Interventions SLP instruction and feedback;Compensatory strategies;Functional tasks;Patient/family education;Multimodal communcation approach;Internal/external aids;Environmental controls   ? Potential to Achieve Goals Good   ? ?  ?  ? ?  ? ? ?Patient will benefit from skilled therapeutic intervention in order to improve the following deficits and impairments:   ?Dysarthria and anarthria ? ? ? ?Problem List ?Patient Active Problem List  ? Diagnosis Date Noted  ? Acute right MCA stroke ([place]) 12/07/2021  ? GERD without esophagitis 12/07/2021  ? Right wrist fracture, with routine healing, subsequent encounter 12/07/2021  ? Bimalleolar ankle fracture, right, closed, with routine healing, subsequent encounter   ? Blepharospasm 11/08/2021  ? Sleep disorder, circadian, shift work type 08/01/2021  ? Excessive daytime sleepiness 08/01/2021  ? Morning headache 02/16/2021  ? Left eye pain 02/16/2021  ? Intractable episodic cluster headache 02/16/2021  ? Behavioral insomnia of childhood 02/16/2021  ? Retrognathia 02/16/2021  ? Chronic migraine without aura, with intractable migraine, so stated, with status migrainosus 01/12/2021  ? Muscle spasm 10/14/2020  ? Vitamin D deficiency 08/15/2020  ? Acute bilateral low back pain with bilateral sciatica 07/13/2020  ? Gastric polyp 02/11/2019  ? Sessile colonic polyp 02/11/2019  ? Chronic fatigue 02/11/2019  ? Grade I internal hemorrhoids 02/11/2019  ? Ductal hyperplasia of breast 08/21/2018  ? Cognitive changes 06/24/2018  ? Dyslipidemia 06/24/2018  ? Dense breast tissue on mammogram 04/17/2018  ? Fibrocystic breast determined by biopsy 04/09/2018  ? Family history of breast cancer in mother 04/09/2018  ? Paroxysmal sinus tachycardia ([place]) 03/27/2018  ? Osteoporosis 08/28/2017  ? Facet arthropathy,  lumbosacral 08/28/2017  ? Chronic left SI joint pain 08/27/2017  ? Left lumbar radiculopathy 08/27/2017  ? Allergic rhinitis 08/09/2017  ? Disorder of lipoid metabolism 08/09/2017  ? Insomnia secondary to anx

## 2022-01-30 ENCOUNTER — Other Ambulatory Visit (HOSPITAL_BASED_OUTPATIENT_CLINIC_OR_DEPARTMENT_OTHER): Payer: Self-pay

## 2022-01-31 ENCOUNTER — Telehealth: Payer: Self-pay | Admitting: Neurology

## 2022-01-31 NOTE — Telephone Encounter (Signed)
[facility] Alice Williams: [phone] (exp. 02/02/22 to 03/04/22) order sent to GI, they will reach out to the patient to schedule.  ?

## 2022-02-01 ENCOUNTER — Ambulatory Visit: Payer: No Typology Code available for payment source | Admitting: Occupational Therapy

## 2022-02-01 ENCOUNTER — Ambulatory Visit: Payer: No Typology Code available for payment source | Admitting: Physical Therapy

## 2022-02-01 ENCOUNTER — Ambulatory Visit: Payer: No Typology Code available for payment source

## 2022-02-01 ENCOUNTER — Other Ambulatory Visit (HOSPITAL_BASED_OUTPATIENT_CLINIC_OR_DEPARTMENT_OTHER): Payer: Self-pay

## 2022-02-01 ENCOUNTER — Encounter: Payer: Self-pay | Admitting: Occupational Therapy

## 2022-02-01 DIAGNOSIS — R296 Repeated falls: Secondary | ICD-10-CM

## 2022-02-01 DIAGNOSIS — R471 Dysarthria and anarthria: Secondary | ICD-10-CM

## 2022-02-01 DIAGNOSIS — R2681 Unsteadiness on feet: Secondary | ICD-10-CM

## 2022-02-01 DIAGNOSIS — M25631 Stiffness of right wrist, not elsewhere classified: Secondary | ICD-10-CM

## 2022-02-01 DIAGNOSIS — M6281 Muscle weakness (generalized): Secondary | ICD-10-CM | POA: Diagnosis not present

## 2022-02-01 NOTE — Patient Instructions (Signed)
Continue with your daily practice   ? ?May--Me--My--Moe--Moo ? ?Loud "ahhhh" - keep it level or neutral; keep the power  ? ?Pitch glides - keep the power  ? ?Read aloud with power and intention  ?

## 2022-02-01 NOTE — Therapy (Signed)
[facility] ?[place], [place], [address] ?Phone: [phone]   Fax:  [phone] ? ?Occupational Therapy Treatment ? ?Patient Details  ?Name: Alice Williams ?MRN: [id number] ?Date of Birth: [birth year] ?Referring Provider (OT): Dr. Sarina Ill ? ? ?Encounter Date: 02/01/2022 ? ? OT End of Session - 02/01/22 1119   ? ? Visit Number 8   ? Number of Visits 25   ? Date for OT Re-Evaluation 03/25/22   ? Authorization Type Focus 2023  copay $40 per visit  No Ded , VL: [place]  Auth Reqd: after 12th visit   ? Authorization - Visit Number 6   corrected count  ? Authorization - Number of Visits 12   ? OT Start Time 1103   ? OT Stop Time 1145   ? OT Time Calculation (min) 42 min   ? Activity Tolerance Patient tolerated treatment well   ? Behavior During Therapy [facility] for tasks assessed/performed   ? ?  ?  ? ?  ? ? ?Past Medical History:  ?Diagnosis Date  ? Anal fissure   ? Anemia   ? in early 20's  ? Anxiety   ? Arthritis   ? back  ? Breast calcifications on mammogram   ? Cancer ([place])   ? pre cancer colon,cervix  ? Colon polyp   ? Dyslipidemia 06/24/2018  ? Elevated cholesterol   ? Facet arthropathy, lumbosacral 08/28/2017  ? Family history of adverse reaction to anesthesia   ? sibling has N/V  ? GERD (gastroesophageal reflux disease)   ? Heart murmur   ? History of colon polyps   ? History of kidney stones   ? has small ones  ? IBS (irritable bowel syndrome)   ? Inappropriate sinus tachycardia   ? Migraines   ? Osteopenia   ? Osteoporosis of femur without pathological fracture 08/28/2017  ? Palpitations   ? PONV (postoperative nausea and vomiting)   ? Premature surgical menopause   ? PTSD (post-traumatic stress disorder)   ? Sleep apnea   ? ? ?Past Surgical History:  ?Procedure Laterality Date  ? ANAL FISSURE REPAIR    ? APPENDECTOMY  2000  ? COLONOSCOPY W/ POLYPECTOMY  03/2015  ? COLONOSCOPY WITH ESOPHAGOGASTRODUODENOSCOPY (EGD)  04/01/2015  ? KNEE SURGERY    ?  OOPHORECTOMY    ? OOPHORECTOMY    ? 1995, 1998  ? ORIF ANKLE FRACTURE Right 11/28/2021  ? Procedure: OPEN REDUCTION INTERNAL FIXATION (ORIF) ANKLE FRACTURE;  Surgeon: Meredith Pel, MD;  Location: [facility];  Service: Orthopedics;  Laterality: Right;  ? PELVIC LAPAROSCOPY    ? x 4 for endometriosis   ? VAGINAL HYSTERECTOMY  1994  ? ? ?There were no vitals filed for this visit. ? ? Subjective Assessment - 02/01/22 1105   ? ? Subjective  I made tuna fish sandwich by myself and carried it to the table. I made ghoulash (my son helped with draining noodles and lifting the big pot). I graduated to the cane today   ? Limitations 01/05/22- received after therapy session was completed - per MD-Okay for AROM and PROM of right wrist. Okay for strengthening exercises in the brace  We will dc brace in ~2 weeks as long as she is doing okay on exam at that point, (pt is cleared for walker use and WB through RLE also)   ? Patient Stated Goals get back to where I was before (return to living alone and go back to work)   ?  Currently in Pain? Yes   ? Pain Score 4    ? Pain Location Arm   ? Pain Descriptors / Indicators --   pulling  ? Pain Type Acute pain   ? Pain Onset More than a month ago   ? Pain Frequency Constant   ? Aggravating Factors  sleeping on Rt side, first thing in the morning, reaching higher   ? Pain Relieving Factors stretches   ? ?  ?  ? ?  ? ? ?Increased wrist strengthening HEP to 2 lb weight - pt performed wrist ext, flex, and RD each x 10 reps  ? ?Gripper set at level 1 resistance to pick up blocks Rt hand for sustained grip strength w/ min difficulty ? ?Passive wrist ext (prayer stretch) x 3 reps, holding 10 sec each ? ?Assessed shoulder pain and issued sh HEP in supine - see pt instructions for details ? ? ? ? ? ? ? ? ? ? ? ? ? ? ? ? ? ? ? OT Education - 02/01/22 1148   ? ? Education Details shoulder HEP   ? Person(s) Educated Patient   ? Methods Explanation;Handout;Verbal cues;Demonstration   ? Comprehension  Verbalized understanding;Returned demonstration   ? ?  ?  ? ?  ? ? ? OT Short Term Goals - 01/29/22 1115   ? ?  ? OT SHORT TERM GOAL #1  ? Title Pt will be independent with initial HEP for strength/activity tolerance.--check STGs 01/23/22   ? Time 4   ? Period Weeks   ? Status Achieved   ?  ? OT SHORT TERM GOAL #2  ? Title Pt will be independent with initial HEP for R wrist ROM (once cleared by MD).   ? Time 4   ? Period Weeks   ? Status Achieved   ?  ? OT SHORT TERM GOAL #3  ? Title Pt will perform BADLs mod I.   ? Time 4   ? Period Weeks   ? Status Achieved   ?  ? OT SHORT TERM GOAL #4  ? Title Pt will perform simple home maintenance/snack prep with supervision.   ? Time 4   ? Period Weeks   ? Status On-going   ? ?  ?  ? ?  ? ? ? ? OT Long Term Goals - 01/29/22 1117   ? ?  ? OT LONG TERM GOAL #1  ? Title Pt will be independent with updated strengthening HEP for bilateral UEs/R wrist (once cleared by MD).--check LTGs 03/25/22   ? Time 12   ? Period Weeks   ? Status On-going   ?  ? OT LONG TERM GOAL #2  ? Title Pt will perform simple clooking and cleaning tasks mod I.   ? Time 12   ? Period Weeks   ? Status New   ?  ? OT LONG TERM GOAL #3  ? Title Pt will be able to complete IADL task for at least 36mn prior to rest break.   ? Time 12   ? Period Weeks   ? Status New   ?  ? OT LONG TERM GOAL #4  ? Title Pt will demo at least 35lbs R grip strength for lifting/carrying objects and opening containers.   ? Time 12   ? Period Weeks   ? Status On-going   01/29/22: Rt = 19.1 lbs, Lt = 44.7 lbs  ?  ? OT LONG TERM GOAL #5  ? Title Pt  will demo at least 60* wrist flex/ext for ADLs/IADLs.   ? Time 12   ? Period Weeks   ? Status Achieved   flex = 65*, ext = 60*  ? ?  ?  ? ?  ? ? ? ? ? ? ? ? Plan - 02/01/22 1120   ? ? Clinical Impression Statement Pt improving w/ functional balance for light meal prep. Pt with increased use of RUE but does report mild pain/pulling from Rt shoulder down to hand.   ? OT Occupational Profile and  History Detailed Assessment- Review of Records and additional review of physical, cognitive, psychosocial history related to current functional performance   ? Occupational performance deficits (Please refer to evaluation for details): ADL's;IADL's;Work;Leisure   ? Body Structure / Function / Physical Skills ADL;Decreased knowledge of use of DME;Strength;Balance;UE functional use;ROM;IADL;Endurance;Mobility   ? Rehab Potential Good   ? Clinical Decision Making Several treatment options, min-mod task modification necessary   ? Comorbidities Affecting Occupational Performance: May have comorbidities impacting occupational performance   ? Modification or Assistance to Complete Evaluation  No modification of tasks or assist necessary to complete eval   ? OT Frequency 2x / week   ? OT Duration 12 weeks   +eval, may modify based on scheduling needs/when precautions are lifted  ? OT Treatment/Interventions Self-care/ADL training;Moist Heat;Fluidtherapy;DME and/or AE instruction;Splinting;Therapeutic activities;Balance training;Contrast Bath;Ultrasound;Therapeutic exercise;Passive range of motion;Functional Mobility Training;Neuromuscular education;Cryotherapy;Electrical Stimulation;Paraffin;Manual Therapy;Patient/family education;Energy conservation   ? Plan updated putty to red resistance, UBE, dynamic standing balance (side stepping along counter and reaching outside BOS), review shoulder HEP and monitor Rt shoulder/arm pain, if time allows - further assess cognition (executive functioning skills)   ? Consulted and Agree with Plan of Care Patient   ? ?  ?  ? ?  ? ? ?Patient will benefit from skilled therapeutic intervention in order to improve the following deficits and impairments:   ?Body Structure / Function / Physical Skills: ADL, Decreased knowledge of use of DME, Strength, Balance, UE functional use, ROM, IADL, Endurance, Mobility ?  ?  ? ? ?Visit Diagnosis: ?Muscle weakness (generalized) ? ?Stiffness of right  wrist, not elsewhere classified ? ? ? ?Problem List ?Patient Active Problem List  ? Diagnosis Date Noted  ? Acute right MCA stroke ([place]) 12/07/2021  ? GERD without esophagitis 12/07/2021  ? Right wrist fracture,

## 2022-02-01 NOTE — Patient Instructions (Signed)
SHOULDER: Flexion - Supine (Cane) ? ? ? ?Hold 2 lb weight in both hands (palms facing). Raise arms up overhead. Do not allow back to arch.  _10__ reps per set, _2__ sets per day. RELAX NECK ? ?ROM: External Rotation - Wand (Supine) ? ? ? ?Lie on back holding wand with elbows bent to 90?Marland Kitchen Rotate forearms to forehead. Put pillows under each arm (upper arm) ?Repeat __10__ times per set.  Do __2__ sessions per day. ? ? ? ?Copyright ? VHI. All rights reserved.  ? ?

## 2022-02-01 NOTE — Therapy (Signed)
?OUTPATIENT PHYSICAL THERAPY TREATMENT NOTE ? ? ?Patient Name: Alice Williams ?MRN: [id number] ?DOB:1961-07-13, 61 y.o., female ?Today's Date: 02/01/2022 ? ?PCP: Mackie Pai, PA-C ?REFERRING PROVIDER: Melvenia Beam, MD  ? ? PT End of Session - 02/01/22 1015   ? ? Visit Number 8   ? Number of Visits 25   ? Date for PT Re-Evaluation 03/14/22   ? Authorization Type Focus ([facility]) Needs auth after 12th visit   ? Authorization - Visit Number 1   ? Authorization - Number of Visits 12   ? PT Start Time 1012   ? PT Stop Time 1100   ? PT Time Calculation (min) 48 min   ? Equipment Utilized During Treatment Gait belt   ? Activity Tolerance Patient tolerated treatment well   Orthostatic  ? Behavior During Therapy [facility] for tasks assessed/performed   ? ?  ?  ? ?  ? ? ? ? ? ? ? ?Past Medical History:  ?Diagnosis Date  ? Anal fissure   ? Anemia   ? in early 20's  ? Anxiety   ? Arthritis   ? back  ? Breast calcifications on mammogram   ? Cancer ([place])   ? pre cancer colon,cervix  ? Colon polyp   ? Dyslipidemia 06/24/2018  ? Elevated cholesterol   ? Facet arthropathy, lumbosacral 08/28/2017  ? Family history of adverse reaction to anesthesia   ? sibling has N/V  ? GERD (gastroesophageal reflux disease)   ? Heart murmur   ? History of colon polyps   ? History of kidney stones   ? has small ones  ? IBS (irritable bowel syndrome)   ? Inappropriate sinus tachycardia   ? Migraines   ? Osteopenia   ? Osteoporosis of femur without pathological fracture 08/28/2017  ? Palpitations   ? PONV (postoperative nausea and vomiting)   ? Premature surgical menopause   ? PTSD (post-traumatic stress disorder)   ? Sleep apnea   ? ?Past Surgical History:  ?Procedure Laterality Date  ? ANAL FISSURE REPAIR    ? APPENDECTOMY  2000  ? COLONOSCOPY W/ POLYPECTOMY  03/2015  ? COLONOSCOPY WITH ESOPHAGOGASTRODUODENOSCOPY (EGD)  04/01/2015  ? KNEE SURGERY    ? OOPHORECTOMY    ? OOPHORECTOMY    ? 1995, 1998  ? ORIF ANKLE FRACTURE Right 11/28/2021  ? Procedure: OPEN  REDUCTION INTERNAL FIXATION (ORIF) ANKLE FRACTURE;  Surgeon: Meredith Pel, MD;  Location: [facility];  Service: Orthopedics;  Laterality: Right;  ? PELVIC LAPAROSCOPY    ? x 4 for endometriosis   ? VAGINAL HYSTERECTOMY  1994  ? ?Patient Active Problem List  ? Diagnosis Date Noted  ? Acute right MCA stroke ([place]) 12/07/2021  ? GERD without esophagitis 12/07/2021  ? Right wrist fracture, with routine healing, subsequent encounter 12/07/2021  ? Bimalleolar ankle fracture, right, closed, with routine healing, subsequent encounter   ? Blepharospasm 11/08/2021  ? Sleep disorder, circadian, shift work type 08/01/2021  ? Excessive daytime sleepiness 08/01/2021  ? Morning headache 02/16/2021  ? Left eye pain 02/16/2021  ? Intractable episodic cluster headache 02/16/2021  ? Behavioral insomnia of childhood 02/16/2021  ? Retrognathia 02/16/2021  ? Chronic migraine without aura, with intractable migraine, so stated, with status migrainosus 01/12/2021  ? Muscle spasm 10/14/2020  ? Vitamin D deficiency 08/15/2020  ? Acute bilateral low back pain with bilateral sciatica 07/13/2020  ? Gastric polyp 02/11/2019  ? Sessile colonic polyp 02/11/2019  ? Chronic fatigue 02/11/2019  ? Grade I internal  hemorrhoids 02/11/2019  ? Ductal hyperplasia of breast 08/21/2018  ? Cognitive changes 06/24/2018  ? Dyslipidemia 06/24/2018  ? Dense breast tissue on mammogram 04/17/2018  ? Fibrocystic breast determined by biopsy 04/09/2018  ? Family history of breast cancer in mother 04/09/2018  ? Paroxysmal sinus tachycardia ([place]) 03/27/2018  ? Osteoporosis 08/28/2017  ? Facet arthropathy, lumbosacral 08/28/2017  ? Chronic left SI joint pain 08/27/2017  ? Left lumbar radiculopathy 08/27/2017  ? Allergic rhinitis 08/09/2017  ? Disorder of lipoid metabolism 08/09/2017  ? Insomnia secondary to anxiety 08/09/2017  ? Postmenopausal osteoporosis 08/09/2017  ? History of hysterectomy for benign disease 08/09/2017  ? GAD (generalized anxiety disorder) 08/09/2017   ? Former smoker, stopped smoking in distant past 08/09/2017  ? Minor depressive disorder 08/09/2017  ? Palpitations 03/16/2015  ? Family history of colon cancer 03/02/2015  ? Hiatal hernia with GERD without esophagitis 03/02/2015  ? History of adenomatous polyp of colon 03/02/2015  ? Chronic migraine without aura 10/10/2014  ? History of cardiovascular disorder 01/18/2003  ? ? ?REFERRING DIAG: R53.1 (ICD-10-CM) - Left-sided weakness R41.4 (ICD-10-CM) - Left-sided neglect I63.411 (ICD-10-CM) - Cerebrovascular accident (CVA) due to embolism of right middle cerebral artery (HCC) I63.9,I77.70 (ICD-10-CM) - Cerebral ischemic stroke due to arterial dissection (HCC)  ? ?THERAPY DIAG:  ?Muscle weakness (generalized) ? ?Unsteadiness on feet ? ?Repeated falls ? ?PERTINENT HISTORY: SVT, migraine headaches, PTSD, anxiety, irritable bowel syndrome, GERD, osteoporosis, sleep apnea, left sciatica.  ? ?PRECAUTIONS: Fall, L hemi, Full weightbearing R wrist, WBAT RLE  ? ?SUBJECTIVE: Pt reports she made dinner for the first time by herself yesterday, went very well. Reports her RUE has been aching at night at posterior arm.  ? ?PAIN:  ?Are you having pain? No ?OBJECTIVE ? ?TODAY'S TREATMENT:  ?  Pt wore compression sock on BLEs throughout session  ? ?Gait Training ?Gait pattern: step through pattern, decreased arm swing- Right, decreased step length- Left, decreased stance time- Right, decreased hip/knee flexion- Right, decreased hip/knee flexion- Left, decreased ankle dorsiflexion- Right, decreased ankle dorsiflexion- Left, antalgic, lateral lean- Left, and poor foot clearance- Left ?Distance walked: 150' ?Assistive device utilized: Quad cane large base in LUE  ?Level of assistance: SBA ?Comments: Noted minor trunk lean to L side, min verbal cues for midline orientation and improved step clearance of LLE.  ? ?Gait pattern: see above  ?Distance walked: 150' while holding water cup in RUE  ?Assistive device utilized: Quad cane large  base ?Level of assistance: SBA ?Comments: Pt able to hold cup of water steady throughout walk, no LOB noted or spilling. Decreased cadence as pt fatigued  ? ?Gait pattern: see above ?Distance walked: >250'  ?Assistive device utilized: Quad cane large base ?Level of assistance: CGA ?Comments: Outside on uneven sidewalk, noted increased scuffing of LLE, provided min verbal cues to assume steppage gait pattern to over-exaggerate step clearance of LLE. Pt demonstrated very slow and cautious cadence but improved RUE arm swing.  ? ? ? ?PATIENT EDUCATION: ?Education details: Cleared to use LBQD at home, using RW when fatigued  ?Person educated: Patient ?Education method: Explanation and Handouts ?Education comprehension: verbalized understanding and needs further education ? ? ?HOME EXERCISE PROGRAM: ?Access Code: [email or web address] ?URL: [email or web address] ?Date: 01/18/2022 ?Prepared by: Mickie Bail Pinki Rottman ? ?Exercises ?- Standing March with Counter Support  - 1 x daily - 7 x weekly - 3 sets - 10 reps ?- Proper Sit to Stand Technique  - 1 x daily - 7 x weekly - 3 sets - 10 reps ?-  Calf stretch  - 1 x daily - 7 x weekly - 3 sets - 10 reps - 30s hold ?- Heel Raises with Counter Support  - 1 x daily - 7 x weekly - 3 sets - 10 reps - 3s hold ? ? ? ? PT Short Term Goals - 01/09/22 1210   ? ?  ? PT SHORT TERM GOAL #1  ? Title Pt will be independent and compliant with initial HEP in order to improve functional mobility.  (Target Date: 01/13/22)   ? Time 4   ? Period Weeks   ? Status Achieved   ? Target Date 01/13/22   ?  ? PT SHORT TERM GOAL #2  ? Title Pt will perform lateral scoot transfers both directions at min A level maintaining NWB precautions without cues.   ? Time 4   ? Period Weeks   ? Status Achieved   ?  ? PT SHORT TERM GOAL #3  ? Title Will assess and address any vertigo that may be present in order to dec dizziness.   ? Time 4   ? Period Weeks   ? Status Achieved   ?  ? PT SHORT TERM GOAL #4  ? Title Pt will  perform sit<>stand at min A level maintaining NWB precautions.   ? Time 4   ? Period Weeks   ? Status Achieved   ?  ? PT SHORT TERM GOAL #5  ? Title Pt will report being out of bed in sitting position at [employer]

## 2022-02-01 NOTE — Therapy (Signed)
[facility] [address] ?[place], [place], [address] ?Phone: [phone]   Fax:  [phone] ? ?Speech Language Pathology Treatment ? ?Patient Details  ?Name: Alice Williams ?MRN: [id number] ?Date of Birth: 04-17-1961 ?Referring Provider (SLP): Dr. Rosalin Hawking ? ? ?Encounter Date: 02/01/2022 ? ? End of Session - 02/01/22 1221   ? ? Visit Number 7   ? Number of Visits 25   ? Date for SLP Re-Evaluation 03/23/22   ? Authorization Type needs auth after 12 visits per discipline   ? Authorization - Visit Number 6   ? Authorization - Number of Visits 12   ? SLP Start Time 1150   ? SLP Stop Time  1230   ? SLP Time Calculation (min) 40 min   ? Activity Tolerance Patient tolerated treatment well   ? ?  ?  ? ?  ? ? ?Past Medical History:  ?Diagnosis Date  ? Anal fissure   ? Anemia   ? in early 20's  ? Anxiety   ? Arthritis   ? back  ? Breast calcifications on mammogram   ? Cancer ([place])   ? pre cancer colon,cervix  ? Colon polyp   ? Dyslipidemia 06/24/2018  ? Elevated cholesterol   ? Facet arthropathy, lumbosacral 08/28/2017  ? Family history of adverse reaction to anesthesia   ? sibling has N/V  ? GERD (gastroesophageal reflux disease)   ? Heart murmur   ? History of colon polyps   ? History of kidney stones   ? has small ones  ? IBS (irritable bowel syndrome)   ? Inappropriate sinus tachycardia   ? Migraines   ? Osteopenia   ? Osteoporosis of femur without pathological fracture 08/28/2017  ? Palpitations   ? PONV (postoperative nausea and vomiting)   ? Premature surgical menopause   ? PTSD (post-traumatic stress disorder)   ? Sleep apnea   ? ? ?Past Surgical History:  ?Procedure Laterality Date  ? ANAL FISSURE REPAIR    ? APPENDECTOMY  2000  ? COLONOSCOPY W/ POLYPECTOMY  03/2015  ? COLONOSCOPY WITH ESOPHAGOGASTRODUODENOSCOPY (EGD)  04/01/2015  ? KNEE SURGERY    ? OOPHORECTOMY    ? OOPHORECTOMY    ? 1995, 1998  ? ORIF ANKLE FRACTURE Right 11/28/2021  ? Procedure: OPEN  REDUCTION INTERNAL FIXATION (ORIF) ANKLE FRACTURE;  Surgeon: Meredith Pel, MD;  Location: [facility];  Service: Orthopedics;  Laterality: Right;  ? PELVIC LAPAROSCOPY    ? x 4 for endometriosis   ? VAGINAL HYSTERECTOMY  1994  ? ? ?There were no vitals filed for this visit. ? ? Subjective Assessment - 02/01/22 1237   ? ? Subjective "I've been doing my exercises every day"   ? Currently in Pain? Yes   ? Pain Score 4    ? Pain Location Arm   ? ?  ?  ? ?  ? ? ? ? ? ? ? ? ADULT SLP TREATMENT - 02/01/22 1240   ? ?  ? General Information  ? Behavior/Cognition Alert;Cooperative;Pleasant mood   ?  ? Treatment Provided  ? Treatment provided Cognitive-Linquistic   ?  ? Cognitive-Linquistic Treatment  ? Treatment focused on Dysarthria;Patient/family/caregiver education   ? Skilled Treatment Pt entered with improved vocal quality, clarity, and volume today. Improved consistency of HEP reported. Occasional fading to rare min A required to increase breath support and pitch (tendency to get high pitch and strained) on structured tasks. Pt is becoming increasingly aware of  changes in vocal quality in structured and unstructured tasks and pt able to usually self-correct this session. In conversation, occasional cues required to maintain adequate volume.   ?  ? Assessment / Recommendations / Plan  ? Plan Continue with current plan of care   ?  ? Progression Toward Goals  ? Progression toward goals Progressing toward goals   ? ?  ?  ? ?  ? ? ? SLP Education - 02/01/22 1243   ? ? Education Details HEP modfications   ? Person(s) Educated Patient   ? Methods Explanation;Demonstration;Verbal cues;Handout   ? Comprehension Verbalized understanding;Returned demonstration;Need further instruction   ? ?  ?  ? ?  ? ? ? SLP Short Term Goals - 02/01/22 1154   ? ?  ? SLP SHORT TERM GOAL #1  ? Title Pt will complete loud AH with average of 85dB over 2 sessions with rare min A   ? Baseline 01-01-22, 01-18-22   ? Status Achieved   ?  ? SLP SHORT TERM  GOAL #2  ? Title Pt will complete HEP for voice/dysarthria (PHoRTE) with rare min A over 2 sessions   ? Baseline 01-18-22, 02-01-22   ? Status Achieved   ?  ? SLP SHORT TERM GOAL #3  ? Title Pt will average 72dB 18/20 sentences in structured speech tasks   ? Baseline 01-01-22, 01-18-22   ? Status Achieved   ?  ? SLP SHORT TERM GOAL #4  ? Title Pt will maintain average of 70dB and clear phonation over 8 minute conversation with occasional min A   ? Baseline 02-01-22   ? Time 1   ? Period Weeks   ? Status On-going   ? ?  ?  ? ?  ? ? ? SLP Long Term Goals - 02/01/22 1226   ? ?  ? SLP LONG TERM GOAL #1  ? Title Pt will complete HEP for dysarthria with mod I   ? Time 9   ? Period Weeks   ? Status On-going   ?  ? SLP LONG TERM GOAL #2  ? Title Pt will self correct low, hoarse voice with rare min A as needed over 2 sessions   ? Baseline 02-01-22   ? Time 9   ? Period Weeks   ? Status On-going   ?  ? SLP LONG TERM GOAL #3  ? Title Pt will average 70dB over 15 minute conversation with rare min A   ? Time 9   ? Period Weeks   ? Status On-going   ?  ? SLP LONG TERM GOAL #4  ? Title Pt will be 100% intelligible in noisy environment with rare min A over 15 minute conversation   ? Time 9   ? Period Weeks   ? Status On-going   ?  ? SLP LONG TERM GOAL #5  ? Title Pt will improve score on Voice Related Quality of Life Measure (VRQOL) raw score by 2 points   ? Baseline raw score - 20; calculated score - 75   ? Time 9   ? Period Weeks   ? Status On-going   ? ?  ?  ? ?  ? ? ? Plan - 02/01/22 1222   ? ? Clinical Impression Statement Today, Alice Williams presents with improvements in mild hypokinetic dysarthria c/b improved WNL volume and rare roughness/hoarseness today. SLP conducted ongoing education and instruction of SOVTE, [place], and Speak Out! exercises to optimize vocal quality and reduce  tension/roughness, with good usual benefit. Improving awareness and carryover of forward focused phonation and intentional voicing exhibited on structured  speech tasks. Pt would benefit from skilled ST intervention to optimize speech intelligilbility and communication effectiveness.   ? Speech Therapy Frequency 2x / week   ? Duration 12 weeks   ? Treatment/Interventions SLP instruction and feedback;Compensatory strategies;Functional tasks;Patient/family education;Multimodal communcation approach;Internal/external aids;Environmental controls   ? Potential to Achieve Goals Good   ? ?  ?  ? ?  ? ? ?Patient will benefit from skilled therapeutic intervention in order to improve the following deficits and impairments:   ?Dysarthria and anarthria ? ? ? ?Problem List ?Patient Active Problem List  ? Diagnosis Date Noted  ? Acute right MCA stroke ([facility]) 12/07/2021  ? GERD without esophagitis 12/07/2021  ? Right wrist fracture, with routine healing, subsequent encounter 12/07/2021  ? Bimalleolar ankle fracture, right, closed, with routine healing, subsequent encounter   ? Blepharospasm 11/08/2021  ? Sleep disorder, circadian, shift work type 08/01/2021  ? Excessive daytime sleepiness 08/01/2021  ? Morning headache 02/16/2021  ? Left eye pain 02/16/2021  ? Intractable episodic cluster headache 02/16/2021  ? Behavioral insomnia of childhood 02/16/2021  ? Retrognathia 02/16/2021  ? Chronic migraine without aura, with intractable migraine, so stated, with status migrainosus 01/12/2021  ? Muscle spasm 10/14/2020  ? Vitamin D deficiency 08/15/2020  ? Acute bilateral low back pain with bilateral sciatica 07/13/2020  ? Gastric polyp 02/11/2019  ? Sessile colonic polyp 02/11/2019  ? Chronic fatigue 02/11/2019  ? Grade I internal hemorrhoids 02/11/2019  ? Ductal hyperplasia of breast 08/21/2018  ? Cognitive changes 06/24/2018  ? Dyslipidemia 06/24/2018  ? Dense breast tissue on mammogram 04/17/2018  ? Fibrocystic breast determined by biopsy 04/09/2018  ? Family history of breast cancer in mother 04/09/2018  ? Paroxysmal sinus tachycardia ([facility]) 03/27/2018  ? Osteoporosis 08/28/2017  ? Facet  arthropathy, lumbosacral 08/28/2017  ? Chronic left SI joint pain 08/27/2017  ? Left lumbar radiculopathy 08/27/2017  ? Allergic rhinitis 08/09/2017  ? Disorder of lipoid metabolism 08/09/2017  ? Insomnia s

## 2022-02-05 ENCOUNTER — Ambulatory Visit: Payer: No Typology Code available for payment source

## 2022-02-05 ENCOUNTER — Ambulatory Visit: Payer: No Typology Code available for payment source | Admitting: Physical Therapy

## 2022-02-05 DIAGNOSIS — M6281 Muscle weakness (generalized): Secondary | ICD-10-CM

## 2022-02-05 DIAGNOSIS — R471 Dysarthria and anarthria: Secondary | ICD-10-CM

## 2022-02-05 DIAGNOSIS — R296 Repeated falls: Secondary | ICD-10-CM

## 2022-02-05 DIAGNOSIS — R2681 Unsteadiness on feet: Secondary | ICD-10-CM

## 2022-02-05 NOTE — Patient Instructions (Signed)
?  ACID REFLUX PRECAUTIONS  ? ?ACID REFLUX can be a possible cause of a voice disorder or throat irritation. Acid reflux is a disorder where acid from your stomach is abnormally spilled over onto your voice box after eating, during sleep, or even during singing. Acid reflux causes irritation and inflammation to your vocal folds and should be avoided and treated by changing eating habits, changing lifestyle, and taking medication (if prescribed by your doctor).  ? ?CHANGE EATING HABITS  ? Avoid ?trigger? foods. Certain foods and drinks can trigger acid reflux. ? 1. Caffeine- in coffee, tea, chocolate, sodas ? 2. Carbonated beverages  ?3. Mint and menthol  ?4. Fatty/fried foods  ?5. Citrus fruits  ?6. Tomato products  ?7. Spicy foods  ?8. Alcohol  ? ? CHANGING LIFESTYLE HABITS  ? Drink 8 glasses of water per day (64oz)   ? Stop smoking  ? Avoid clearing your throat  ? Allow 3 hours between last big meal and going to bed at night  ? Keep yourself upright for one hour after you eat ?  Elevate the head of your bed using 6-inch blocks under the head of the bed or a bed wedge between the box spring and the mattress. ?  Eat small meals throughout the day rather than 3 big meals ?  Eat slowly  ? Wear loose clothing  ? ?TAKING MEDICATION ?  If prescribed one time a day, take 15-30 minutes before breakfast ?  If prescribed two times a day, take 15-30 minutes before breakfast and 15- 30 minutes before dinner  ? ?CHANGING THE WAY YOU USE YOUR VOICE ? ?Best voice/ Least effort" ? ? ?

## 2022-02-05 NOTE — Therapy (Signed)
?OUTPATIENT PHYSICAL THERAPY TREATMENT NOTE ? ? ?Patient Name: Alice Williams ?MRN: [id number] ?DOB:08/09/1961, 61 y.o., female ?Today's Date: 02/05/2022 ? ?PCP: Mackie Pai, PA-C ?REFERRING PROVIDER: Melvenia Beam, MD  ? ? PT End of Session - 02/05/22 1021   ? ? Visit Number 9   ? Number of Visits 25   ? Date for PT Re-Evaluation 03/14/22   ? Authorization Type [facility] Needs auth after 12th visit   ? Authorization - Visit Number 1   ? Authorization - Number of Visits 12   ? PT Start Time 1018   ? PT Stop Time 1100   ? PT Time Calculation (min) 42 min   ? Equipment Utilized During Treatment Gait belt   ? Activity Tolerance Patient tolerated treatment well   Orthostatic  ? Behavior During Therapy [facility] for tasks assessed/performed   ? ?  ?  ? ?  ? ? ? ? ? ? ? ? ?Past Medical History:  ?Diagnosis Date  ? Anal fissure   ? Anemia   ? in early 20's  ? Anxiety   ? Arthritis   ? back  ? Breast calcifications on mammogram   ? Cancer ([facility])   ? pre cancer colon,cervix  ? Colon polyp   ? Dyslipidemia 06/24/2018  ? Elevated cholesterol   ? Facet arthropathy, lumbosacral 08/28/2017  ? Family history of adverse reaction to anesthesia   ? sibling has N/V  ? GERD (gastroesophageal reflux disease)   ? Heart murmur   ? History of colon polyps   ? History of kidney stones   ? has small ones  ? IBS (irritable bowel syndrome)   ? Inappropriate sinus tachycardia   ? Migraines   ? Osteopenia   ? Osteoporosis of femur without pathological fracture 08/28/2017  ? Palpitations   ? PONV (postoperative nausea and vomiting)   ? Premature surgical menopause   ? PTSD (post-traumatic stress disorder)   ? Sleep apnea   ? ?Past Surgical History:  ?Procedure Laterality Date  ? ANAL FISSURE REPAIR    ? APPENDECTOMY  2000  ? COLONOSCOPY W/ POLYPECTOMY  03/2015  ? COLONOSCOPY WITH ESOPHAGOGASTRODUODENOSCOPY (EGD)  04/01/2015  ? KNEE SURGERY    ? OOPHORECTOMY    ? OOPHORECTOMY    ? 1995, 1998  ? ORIF ANKLE FRACTURE Right 11/28/2021  ? Procedure: OPEN  REDUCTION INTERNAL FIXATION (ORIF) ANKLE FRACTURE;  Surgeon: Meredith Pel, MD;  Location: [facility];  Service: Orthopedics;  Laterality: Right;  ? PELVIC LAPAROSCOPY    ? x 4 for endometriosis   ? VAGINAL HYSTERECTOMY  1994  ? ?Patient Active Problem List  ? Diagnosis Date Noted  ? Acute right MCA stroke ([place]) 12/07/2021  ? GERD without esophagitis 12/07/2021  ? Right wrist fracture, with routine healing, subsequent encounter 12/07/2021  ? Bimalleolar ankle fracture, right, closed, with routine healing, subsequent encounter   ? Blepharospasm 11/08/2021  ? Sleep disorder, circadian, shift work type 08/01/2021  ? Excessive daytime sleepiness 08/01/2021  ? Morning headache 02/16/2021  ? Left eye pain 02/16/2021  ? Intractable episodic cluster headache 02/16/2021  ? Behavioral insomnia of childhood 02/16/2021  ? Retrognathia 02/16/2021  ? Chronic migraine without aura, with intractable migraine, so stated, with status migrainosus 01/12/2021  ? Muscle spasm 10/14/2020  ? Vitamin D deficiency 08/15/2020  ? Acute bilateral low back pain with bilateral sciatica 07/13/2020  ? Gastric polyp 02/11/2019  ? Sessile colonic polyp 02/11/2019  ? Chronic fatigue 02/11/2019  ? Grade I  internal hemorrhoids 02/11/2019  ? Ductal hyperplasia of breast 08/21/2018  ? Cognitive changes 06/24/2018  ? Dyslipidemia 06/24/2018  ? Dense breast tissue on mammogram 04/17/2018  ? Fibrocystic breast determined by biopsy 04/09/2018  ? Family history of breast cancer in mother 04/09/2018  ? Paroxysmal sinus tachycardia ([place]) 03/27/2018  ? Osteoporosis 08/28/2017  ? Facet arthropathy, lumbosacral 08/28/2017  ? Chronic left SI joint pain 08/27/2017  ? Left lumbar radiculopathy 08/27/2017  ? Allergic rhinitis 08/09/2017  ? Disorder of lipoid metabolism 08/09/2017  ? Insomnia secondary to anxiety 08/09/2017  ? Postmenopausal osteoporosis 08/09/2017  ? History of hysterectomy for benign disease 08/09/2017  ? GAD (generalized anxiety disorder) 08/09/2017   ? Former smoker, stopped smoking in distant past 08/09/2017  ? Minor depressive disorder 08/09/2017  ? Palpitations 03/16/2015  ? Family history of colon cancer 03/02/2015  ? Hiatal hernia with GERD without esophagitis 03/02/2015  ? History of adenomatous polyp of colon 03/02/2015  ? Chronic migraine without aura 10/10/2014  ? History of cardiovascular disorder 01/18/2003  ? ? ?REFERRING DIAG: R53.1 (ICD-10-CM) - Left-sided weakness R41.4 (ICD-10-CM) - Left-sided neglect I63.411 (ICD-10-CM) - Cerebrovascular accident (CVA) due to embolism of right middle cerebral artery (HCC) I63.9,I77.70 (ICD-10-CM) - Cerebral ischemic stroke due to arterial dissection (HCC)  ? ?THERAPY DIAG:  ?Muscle weakness (generalized) ? ?Unsteadiness on feet ? ?Repeated falls ? ?PERTINENT HISTORY: SVT, migraine headaches, PTSD, anxiety, irritable bowel syndrome, GERD, osteoporosis, sleep apnea, left sciatica.  ? ?PRECAUTIONS: Fall, L hemi, Full weightbearing R wrist, WBAT RLE  ? ?SUBJECTIVE: Pt reports she made Easter dinner by herself, was able to take ham out of oven w/minor burn to her R hand. Also took shower for the first time since her stroke last night using shower chair and RW.  ? ?PAIN:  ?Are you having pain? Yes ?Pain Location: R wrist  ?Pain severity: 3/10  ?OBJECTIVE ? ?TODAY'S TREATMENT:  ?Pt wore compression sock on BLEs throughout session. Pt ambulated into clinic w/LBQD in LUE. Adjusted cane to proper height.  ? ?Ther Ex ?NuStep level 4 for 6 minutes w/BLEs/BUEs for dynamic cardiovascular warmup, LLE NMR and UE/LE coordination to prime for stair training. Min cues to maintain steps/min > 60. Total of 369 steps. ? ?Wall gastroc stretch, 2x45s per side for improved ankle mobility of BLEs  ? ?Standing hip abduction at counter (added to HEP) for improved glute strength and single leg stability, x10 reps per side  ? ? ?Gait Training ? ?STAIRS: ? Level of Assistance: SBA ? Stair Negotiation Technique: Step to Pattern ?With use of  AD: LBQD in LUE  with No Rails ? Number of Stairs: 4 steps completed 3 times   ? Height of Stairs: 6"  ?Comments: Mod verbal cues for proper leg sequencing throughout. Pt demonstrated good use of cane and balance throughout.  ? ?GAIT: ?Gait pattern: step through pattern, decreased arm swing- Right, decreased step length- Right, decreased step length- Left, decreased stance time- Right, Right hip hike, lateral lean- Left, narrow BOS, and poor foot clearance- Left ?Distance walked: Various clinic distances  ?Assistive device utilized: Quad cane large base ?Level of assistance: SBA ?Comments: Pt demonstrates slow cadence but step-through pattern with increased step clearance of LLE. Min cues for midline orientation as pt tends to assume L truncal lean throughout.  ? ? ? ?PATIENT EDUCATION: ?Education details: Increasing walking program at home, using tennis ball to roll plantar fascia of RLE, additions to HEP ?Person educated: Patient ?Education method: Explanation and Handouts ?Education  comprehension: verbalized understanding and needs further education ? ? ?HOME EXERCISE PROGRAM: ?Access Code: [email or web address] ?URL: [email or web address] ?Date: 02/05/2022 ?Prepared by: Mickie Bail Issachar Broady ? ?Exercises ?- Standing March with Counter Support  - 1 x daily - 7 x weekly - 3 sets - 10 reps ?- Proper Sit to Stand Technique  - 1 x daily - 7 x weekly - 3 sets - 10 reps ?- Calf stretch  - 1 x daily - 7 x weekly - 3 sets - 10 reps - 30s hold ?- Heel Raises with Counter Support  - 1 x daily - 7 x weekly - 3 sets - 10 reps - 3s hold ?- Standing Hip Abduction with Unilateral Counter Support  - 1 x daily - 7 x weekly - 3 sets - 10 reps ? ? ? ? PT Short Term Goals - 01/09/22 1210   ? ?  ? PT SHORT TERM GOAL #1  ? Title Pt will be independent and compliant with initial HEP in order to improve functional mobility.  (Target Date: 01/13/22)   ? Time 4   ? Period Weeks   ? Status Achieved   ? Target Date 01/13/22   ?  ? PT SHORT TERM  GOAL #2  ? Title Pt will perform lateral scoot transfers both directions at min A level maintaining NWB precautions without cues.   ? Time 4   ? Period Weeks   ? Status Achieved   ?  ? PT SHORT TERM GO

## 2022-02-05 NOTE — Therapy (Signed)
[facility] ?[place] ?[place], [place], [address] ?Phone: [phone]   Fax:  [phone] ? ?Speech Language Pathology Treatment ? ?Patient Details  ?Name: Alice Williams ?MRN: [id number] ?Date of Birth: 01-04-1961 ?Referring Provider (SLP): Dr. Rosalin Hawking ? ? ?Encounter Date: 02/05/2022 ? ? End of Session - 02/05/22 1006   ? ? Visit Number 8   ? Number of Visits 25   ? Date for SLP Re-Evaluation 03/23/22   ? Authorization Type needs auth after 12 visits per discipline   ? Authorization - Visit Number 7   ? Authorization - Number of Visits 12   ? SLP Start Time 1100   ? SLP Stop Time  1142   ? SLP Time Calculation (min) 42 min   ? Activity Tolerance Patient tolerated treatment well   ? ?  ?  ? ?  ? ? ?Past Medical History:  ?Diagnosis Date  ? Anal fissure   ? Anemia   ? in early 20's  ? Anxiety   ? Arthritis   ? back  ? Breast calcifications on mammogram   ? Cancer ([place])   ? pre cancer colon,cervix  ? Colon polyp   ? Dyslipidemia 06/24/2018  ? Elevated cholesterol   ? Facet arthropathy, lumbosacral 08/28/2017  ? Family history of adverse reaction to anesthesia   ? sibling has N/V  ? GERD (gastroesophageal reflux disease)   ? Heart murmur   ? History of colon polyps   ? History of kidney stones   ? has small ones  ? IBS (irritable bowel syndrome)   ? Inappropriate sinus tachycardia   ? Migraines   ? Osteopenia   ? Osteoporosis of femur without pathological fracture 08/28/2017  ? Palpitations   ? PONV (postoperative nausea and vomiting)   ? Premature surgical menopause   ? PTSD (post-traumatic stress disorder)   ? Sleep apnea   ? ? ?Past Surgical History:  ?Procedure Laterality Date  ? ANAL FISSURE REPAIR    ? APPENDECTOMY  2000  ? COLONOSCOPY W/ POLYPECTOMY  03/2015  ? COLONOSCOPY WITH ESOPHAGOGASTRODUODENOSCOPY (EGD)  04/01/2015  ? KNEE SURGERY    ? OOPHORECTOMY    ? OOPHORECTOMY    ? 1995, 1998  ? ORIF ANKLE FRACTURE Right 11/28/2021  ? Procedure: OPEN  REDUCTION INTERNAL FIXATION (ORIF) ANKLE FRACTURE;  Surgeon: Meredith Pel, MD;  Location: [facility];  Service: Orthopedics;  Laterality: Right;  ? PELVIC LAPAROSCOPY    ? x 4 for endometriosis   ? VAGINAL HYSTERECTOMY  1994  ? ? ?There were no vitals filed for this visit. ? ? Subjective Assessment - 02/05/22 1100   ? ? Subjective "I had a voice until yesterday. I started heaving and I lost it"   ? Currently in Pain? Yes   ? Pain Score 2    ? Pain Location Foot   ? ?  ?  ? ?  ? ? ? ? ? ? ? ? ADULT SLP TREATMENT - 02/05/22 1006   ? ?  ? General Information  ? Behavior/Cognition Alert;Cooperative;Pleasant mood   ?  ? Treatment Provided  ? Treatment provided Cognitive-Linquistic   ?  ? Cognitive-Linquistic Treatment  ? Treatment focused on Dysarthria;Patient/family/caregiver education   ? Skilled Treatment Intermittent setbacks in vocal quality appear related to GI issues (regurgitation/heaving) causing throat irriation and voice disorder. Pt reported excellent carryover of volume and clarity until yesterday as pt experienced "heaving." SOVT exercises initiated, which somewhat resolved hoarseness.  Pt able to identify reduced effort and energy during SOVTE with rare min A. Occasional improved vocal quality achieved with cued increased breath support and forward focused phonation via flow phonation exercises. SLP educated acid reflux precautions and foods to avoid as this may be contributing to GI component.   ?  ? Assessment / Recommendations / Plan  ? Plan Continue with current plan of care   ?  ? Progression Toward Goals  ? Progression toward goals Progressing toward goals   ? ?  ?  ? ?  ? ? ? SLP Education - 02/05/22 1251   ? ? Education Details acid reflux precautions   ? Person(s) Educated Patient   ? Methods Explanation;Demonstration;Verbal cues;Handout   ? Comprehension Verbalized understanding;Returned demonstration;Verbal cues required;Need further instruction   ? ?  ?  ? ?  ? ? ? SLP Short Term Goals -  02/01/22 1154   ? ?  ? SLP SHORT TERM GOAL #1  ? Title Pt will complete loud AH with average of 85dB over 2 sessions with rare min A   ? Baseline 01-01-22, 01-18-22   ? Status Achieved   ?  ? SLP SHORT TERM GOAL #2  ? Title Pt will complete HEP for voice/dysarthria (PHoRTE) with rare min A over 2 sessions   ? Baseline 01-18-22, 02-01-22   ? Status Achieved   ?  ? SLP SHORT TERM GOAL #3  ? Title Pt will average 72dB 18/20 sentences in structured speech tasks   ? Baseline 01-01-22, 01-18-22   ? Status Achieved   ?  ? SLP SHORT TERM GOAL #4  ? Title Pt will maintain average of 70dB and clear phonation over 8 minute conversation with occasional min A   ? Baseline 02-01-22   ? Time 1   ? Period Weeks   ? Status On-going   ? ?  ?  ? ?  ? ? ? SLP Long Term Goals - 02/05/22 1007   ? ?  ? SLP LONG TERM GOAL #1  ? Title Pt will complete HEP for dysarthria with mod I   ? Time 8   ? Period Weeks   ? Status On-going   ?  ? SLP LONG TERM GOAL #2  ? Title Pt will self correct low, hoarse voice with rare min A as needed over 2 sessions   ? Baseline 02-01-22   ? Time 8   ? Period Weeks   ? Status On-going   ?  ? SLP LONG TERM GOAL #3  ? Title Pt will average 70dB over 15 minute conversation with rare min A   ? Time 8   ? Period Weeks   ? Status On-going   ?  ? SLP LONG TERM GOAL #4  ? Title Pt will be 100% intelligible in noisy environment with rare min A over 15 minute conversation   ? Time 8   ? Period Weeks   ? Status On-going   ?  ? SLP LONG TERM GOAL #5  ? Title Pt will improve score on Voice Related Quality of Life Measure (VRQOL) raw score by 2 points   ? Baseline raw score - 20; calculated score - 75   ? Time 8   ? Period Weeks   ? Status On-going   ? ?  ?  ? ?  ? ? ? Plan - 02/05/22 1007   ? ? Clinical Impression Statement Today, Alice Williams presents with mild hypokinetic dysarthria c/b inconsistent WNL  volume and increased roughness/hoarseness today. SLP conducted ongoing education and instruction of SOVTE, flow phonation, and Speak Out!  exercises to optimize vocal quality and reduce tension/roughness, with good intermittent benefit. Improving awareness and carryover of forward focused phonation and intentional voicing exhibited on structured speech tasks. Education provided re: acid reflux precautions which may be contributing to GI component. Pt would benefit from skilled ST intervention to optimize speech intelligilbility and communication effectiveness.   ? Speech Therapy Frequency 2x / week   ? Duration 12 weeks   ? Treatment/Interventions SLP instruction and feedback;Compensatory strategies;Functional tasks;Patient/family education;Multimodal communcation approach;Internal/external aids;Environmental controls   ? Potential to Achieve Goals Good   ? ?  ?  ? ?  ? ? ?Patient will benefit from skilled therapeutic intervention in order to improve the following deficits and impairments:   ?Dysarthria and anarthria ? ? ? ?Problem List ?Patient Active Problem List  ? Diagnosis Date Noted  ? Acute right MCA stroke ([place]) 12/07/2021  ? GERD without esophagitis 12/07/2021  ? Right wrist fracture, with routine healing, subsequent encounter 12/07/2021  ? Bimalleolar ankle fracture, right, closed, with routine healing, subsequent encounter   ? Blepharospasm 11/08/2021  ? Sleep disorder, circadian, shift work type 08/01/2021  ? Excessive daytime sleepiness 08/01/2021  ? Morning headache 02/16/2021  ? Left eye pain 02/16/2021  ? Intractable episodic cluster headache 02/16/2021  ? Behavioral insomnia of childhood 02/16/2021  ? Retrognathia 02/16/2021  ? Chronic migraine without aura, with intractable migraine, so stated, with status migrainosus 01/12/2021  ? Muscle spasm 10/14/2020  ? Vitamin D deficiency 08/15/2020  ? Acute bilateral low back pain with bilateral sciatica 07/13/2020  ? Gastric polyp 02/11/2019  ? Sessile colonic polyp 02/11/2019  ? Chronic fatigue 02/11/2019  ? Grade I internal hemorrhoids 02/11/2019  ? Ductal hyperplasia of breast 08/21/2018   ? Cognitive changes 06/24/2018  ? Dyslipidemia 06/24/2018  ? Dense breast tissue on mammogram 04/17/2018  ? Fibrocystic breast determined by biopsy 04/09/2018  ? Family history of breast cancer in mother 06/1

## 2022-02-08 ENCOUNTER — Other Ambulatory Visit (HOSPITAL_BASED_OUTPATIENT_CLINIC_OR_DEPARTMENT_OTHER): Payer: Self-pay

## 2022-02-08 ENCOUNTER — Ambulatory Visit: Payer: No Typology Code available for payment source | Admitting: Physical Therapy

## 2022-02-08 ENCOUNTER — Ambulatory Visit: Payer: No Typology Code available for payment source | Admitting: Speech Pathology

## 2022-02-08 DIAGNOSIS — R296 Repeated falls: Secondary | ICD-10-CM

## 2022-02-08 DIAGNOSIS — R471 Dysarthria and anarthria: Secondary | ICD-10-CM

## 2022-02-08 DIAGNOSIS — R2681 Unsteadiness on feet: Secondary | ICD-10-CM

## 2022-02-08 DIAGNOSIS — M6281 Muscle weakness (generalized): Secondary | ICD-10-CM | POA: Diagnosis not present

## 2022-02-08 NOTE — Therapy (Signed)
[facility] ?[address] ?[place], [place], [address] ?Phone: [phone]   Fax:  [phone] ? ?Speech Language Pathology Treatment ? ?Patient Details  ?Name: Alice Williams ?MRN: [id number] ?Date of Birth: August 19, 1961 ?Referring Provider (SLP): Dr. Rosalin Hawking ? ? ?Encounter Date: 02/08/2022 ? ? End of Session - 02/08/22 1031   ? ? Visit Number 9   ? Number of Visits 25   ? Date for SLP Re-Evaluation 03/23/22   ? Authorization Type needs auth after 12 visits per discipline   ? Authorization - Visit Number 7   ? Authorization - Number of Visits 12   ? SLP Start Time 1100   ? SLP Stop Time  1139   ? SLP Time Calculation (min) 39 min   ? Activity Tolerance Patient tolerated treatment well   ? ?  ?  ? ?  ? ? ?Past Medical History:  ?Diagnosis Date  ? Anal fissure   ? Anemia   ? in early 20's  ? Anxiety   ? Arthritis   ? back  ? Breast calcifications on mammogram   ? Cancer ([place])   ? pre cancer colon,cervix  ? Colon polyp   ? Dyslipidemia 06/24/2018  ? Elevated cholesterol   ? Facet arthropathy, lumbosacral 08/28/2017  ? Family history of adverse reaction to anesthesia   ? sibling has N/V  ? GERD (gastroesophageal reflux disease)   ? Heart murmur   ? History of colon polyps   ? History of kidney stones   ? has small ones  ? IBS (irritable bowel syndrome)   ? Inappropriate sinus tachycardia   ? Migraines   ? Osteopenia   ? Osteoporosis of femur without pathological fracture 08/28/2017  ? Palpitations   ? PONV (postoperative nausea and vomiting)   ? Premature surgical menopause   ? PTSD (post-traumatic stress disorder)   ? Sleep apnea   ? ? ?Past Surgical History:  ?Procedure Laterality Date  ? ANAL FISSURE REPAIR    ? APPENDECTOMY  2000  ? COLONOSCOPY W/ POLYPECTOMY  03/2015  ? COLONOSCOPY WITH ESOPHAGOGASTRODUODENOSCOPY (EGD)  04/01/2015  ? KNEE SURGERY    ? OOPHORECTOMY    ? OOPHORECTOMY    ? 1995, 1998  ? ORIF ANKLE FRACTURE Right 11/28/2021  ? Procedure: OPEN  REDUCTION INTERNAL FIXATION (ORIF) ANKLE FRACTURE;  Surgeon: Meredith Pel, MD;  Location: [facility];  Service: Orthopedics;  Laterality: Right;  ? PELVIC LAPAROSCOPY    ? x 4 for endometriosis   ? VAGINAL HYSTERECTOMY  1994  ? ? ?There were no vitals filed for this visit. ? ? Subjective Assessment - 02/08/22 1051   ? ? Subjective "I am doing good, my voice came back Tuesday morning"   ? Patient is accompained by: Family member   grandson, Alice Williams  ? Currently in Pain? Yes   ? Pain Score 2    ? Pain Location Foot   ? ?  ?  ? ?  ? ? ? ? ? ? ? ? ADULT SLP TREATMENT - 02/08/22 1034   ? ?  ? General Information  ? Behavior/Cognition Alert;Cooperative;Pleasant mood   ?  ? Treatment Provided  ? Treatment provided Cognitive-Linquistic   ?  ? Cognitive-Linquistic Treatment  ? Treatment focused on Dysarthria;Patient/family/caregiver education   ? Skilled Treatment Pt arrives with notable improved vocal quality and adequate vocal intensity (range from 67-72 dB) across 10 minute spontatnous speech sample. ST initiated pt teaching back previously taught voice/dysarthria  strategies and HEP. Pt required occasional min-A for detail and completeness during teach back. Initiation of conversation treatment therapy principles of using clear speech throuough slightly reduced rate, pitch elvation at beginning of utterances, and articulating all sounds. Pt demonstrates during reading exercise of x10 phrases with occasional min-A from ST. Some intermittant horseness noted, pt attributes to post nasal drip, tells ST "I can feel it."   ?  ? Assessment / Recommendations / Plan  ? Plan Continue with current plan of care   ?  ? Progression Toward Goals  ? Progression toward goals Progressing toward goals   ? ?  ?  ? ?  ? ? ? ? ? SLP Short Term Goals - 02/08/22 1032   ? ?  ? SLP SHORT TERM GOAL #1  ? Title Pt will complete loud AH with average of 85dB over 2 sessions with rare min A   ? Baseline 01-01-22, 01-18-22   ? Status Achieved   ?  ? SLP  SHORT TERM GOAL #2  ? Title Pt will complete HEP for voice/dysarthria (PHoRTE) with rare min A over 2 sessions   ? Baseline 01-18-22, 02-01-22   ? Status Achieved   ?  ? SLP SHORT TERM GOAL #3  ? Title Pt will average 72dB 18/20 sentences in structured speech tasks   ? Baseline 01-01-22, 01-18-22   ? Status Achieved   ?  ? SLP SHORT TERM GOAL #4  ? Title Pt will maintain average of 70dB and clear phonation over 8 minute conversation with occasional min A   ? Baseline 02-01-22; 02-08-22   ? Time 1   ? Period Weeks   ? Status Achieved   ? ?  ?  ? ?  ? ? ? SLP Long Term Goals - 02/08/22 1033   ? ?  ? SLP LONG TERM GOAL #1  ? Title Pt will complete HEP for dysarthria with mod I   ? Time 7   ? Period Weeks   ? Status On-going   ?  ? SLP LONG TERM GOAL #2  ? Title Pt will self correct low, hoarse voice with rare min A as needed over 2 sessions   ? Baseline 02-01-22   ? Time 7   ? Period Weeks   ? Status On-going   ?  ? SLP LONG TERM GOAL #3  ? Title Pt will average 70dB over 15 minute conversation with rare min A   ? Time 7   ? Period Weeks   ? Status On-going   ?  ? SLP LONG TERM GOAL #4  ? Title Pt will be 100% intelligible in noisy environment with rare min A over 15 minute conversation   ? Time 7   ? Period Weeks   ? Status On-going   ?  ? SLP LONG TERM GOAL #5  ? Title Pt will improve score on Voice Related Quality of Life Measure (VRQOL) raw score by 2 points   ? Baseline raw score - 20; calculated score - 75   ? Time 7   ? Period Weeks   ? Status On-going   ? ?  ?  ? ?  ? ? ? Plan - 02/08/22 1031   ? ? Clinical Impression Statement Alice Williams presents with mild hypokinetic dysarthria c/b inconsistent WNL volume and intermittant increased roughness/hoarseness.Marland Kitchen SLP implementing ongoing education and instruction of SOVTE, flow phonation, and Speak Out! exercise components to optimize vocal quality + intensity and reduce tension/roughness, with  good  intermittent benefit. Improving awareness and carryover of forward focused  phonation and intentional voicing exhibited on structured speech tasks. ST has provided education provided re: acid reflux precautions which may be contributing to GI component. Pt would benefit from skilled ST intervention to optimize speech intelligilbility and communication effectiveness.   ? Speech Therapy Frequency 2x / week   ? Duration 12 weeks   ? Treatment/Interventions SLP instruction and feedback;Compensatory strategies;Functional tasks;Patient/family education;Multimodal communcation approach;Internal/external aids;Environmental controls   ? Potential to Achieve Goals Good   ? ?  ?  ? ?  ? ? ?Patient will benefit from skilled therapeutic intervention in order to improve the following deficits and impairments:   ?Dysarthria ? ? ? ?Problem List ?Patient Active Problem List  ? Diagnosis Date Noted  ? Acute right MCA stroke ([facility]) 12/07/2021  ? GERD without esophagitis 12/07/2021  ? Right wrist fracture, with routine healing, subsequent encounter 12/07/2021  ? Bimalleolar ankle fracture, right, closed, with routine healing, subsequent encounter   ? Blepharospasm 11/08/2021  ? Sleep disorder, circadian, shift work type 08/01/2021  ? Excessive daytime sleepiness 08/01/2021  ? Morning headache 02/16/2021  ? Left eye pain 02/16/2021  ? Intractable episodic cluster headache 02/16/2021  ? Behavioral insomnia of childhood 02/16/2021  ? Retrognathia 02/16/2021  ? Chronic migraine without aura, with intractable migraine, so stated, with status migrainosus 01/12/2021  ? Muscle spasm 10/14/2020  ? Vitamin D deficiency 08/15/2020  ? Acute bilateral low back pain with bilateral sciatica 07/13/2020  ? Gastric polyp 02/11/2019  ? Sessile colonic polyp 02/11/2019  ? Chronic fatigue 02/11/2019  ? Grade I internal hemorrhoids 02/11/2019  ? Ductal hyperplasia of breast 08/21/2018  ? Cognitive changes 06/24/2018  ? Dyslipidemia 06/24/2018  ? Dense breast tissue on mammogram 04/17/2018  ? Fibrocystic breast determined by biopsy  04/09/2018  ? Family history of breast cancer in mother 04/09/2018  ? Paroxysmal sinus tachycardia ([facility]) 03/27/2018  ? Osteoporosis 08/28/2017  ? Facet arthropathy, lumbosacral 08/28/2017  ? Chronic left SI joint

## 2022-02-08 NOTE — Therapy (Signed)
?OUTPATIENT PHYSICAL THERAPY TREATMENT NOTE ? ? ?Patient Name: Alice Williams ?MRN: [id number] ?DOB:01-Jul-1961, 61 y.o., female ?Today's Date: 02/08/2022 ? ?PCP: Mackie Pai, PA-C ?REFERRING PROVIDER: Melvenia Beam, MD  ? ? PT End of Session - 02/08/22 1021   ? ? Visit Number 10   ? Number of Visits 25   ? Date for PT Re-Evaluation 03/14/22   ? Authorization Type [facility] ([facility]) Needs auth after 12th visit   ? Authorization - Visit Number 1   ? Authorization - Number of Visits 12   ? PT Start Time 1018   ? PT Stop Time 1058   ? PT Time Calculation (min) 40 min   ? Equipment Utilized During Treatment --   ? Activity Tolerance Patient tolerated treatment well   Orthostatic  ? Behavior During Therapy [facility] for tasks assessed/performed   ? ?  ?  ? ?  ? ? ? ? ? ? ? ? ? ?Past Medical History:  ?Diagnosis Date  ? Anal fissure   ? Anemia   ? in early 20's  ? Anxiety   ? Arthritis   ? back  ? Breast calcifications on mammogram   ? Cancer ([place])   ? pre cancer colon,cervix  ? Colon polyp   ? Dyslipidemia 06/24/2018  ? Elevated cholesterol   ? Facet arthropathy, lumbosacral 08/28/2017  ? Family history of adverse reaction to anesthesia   ? sibling has N/V  ? GERD (gastroesophageal reflux disease)   ? Heart murmur   ? History of colon polyps   ? History of kidney stones   ? has small ones  ? IBS (irritable bowel syndrome)   ? Inappropriate sinus tachycardia   ? Migraines   ? Osteopenia   ? Osteoporosis of femur without pathological fracture 08/28/2017  ? Palpitations   ? PONV (postoperative nausea and vomiting)   ? Premature surgical menopause   ? PTSD (post-traumatic stress disorder)   ? Sleep apnea   ? ?Past Surgical History:  ?Procedure Laterality Date  ? ANAL FISSURE REPAIR    ? APPENDECTOMY  2000  ? COLONOSCOPY W/ POLYPECTOMY  03/2015  ? COLONOSCOPY WITH ESOPHAGOGASTRODUODENOSCOPY (EGD)  04/01/2015  ? KNEE SURGERY    ? OOPHORECTOMY    ? OOPHORECTOMY    ? 1995, 1998  ? ORIF ANKLE FRACTURE Right 11/28/2021  ? Procedure: OPEN  REDUCTION INTERNAL FIXATION (ORIF) ANKLE FRACTURE;  Surgeon: Meredith Pel, MD;  Location: [place];  Service: Orthopedics;  Laterality: Right;  ? PELVIC LAPAROSCOPY    ? x 4 for endometriosis   ? VAGINAL HYSTERECTOMY  1994  ? ?Patient Active Problem List  ? Diagnosis Date Noted  ? Acute right MCA stroke ([place]) 12/07/2021  ? GERD without esophagitis 12/07/2021  ? Right wrist fracture, with routine healing, subsequent encounter 12/07/2021  ? Bimalleolar ankle fracture, right, closed, with routine healing, subsequent encounter   ? Blepharospasm 11/08/2021  ? Sleep disorder, circadian, shift work type 08/01/2021  ? Excessive daytime sleepiness 08/01/2021  ? Morning headache 02/16/2021  ? Left eye pain 02/16/2021  ? Intractable episodic cluster headache 02/16/2021  ? Behavioral insomnia of childhood 02/16/2021  ? Retrognathia 02/16/2021  ? Chronic migraine without aura, with intractable migraine, so stated, with status migrainosus 01/12/2021  ? Muscle spasm 10/14/2020  ? Vitamin D deficiency 08/15/2020  ? Acute bilateral low back pain with bilateral sciatica 07/13/2020  ? Gastric polyp 02/11/2019  ? Sessile colonic polyp 02/11/2019  ? Chronic fatigue 02/11/2019  ? Grade I  internal hemorrhoids 02/11/2019  ? Ductal hyperplasia of breast 08/21/2018  ? Cognitive changes 06/24/2018  ? Dyslipidemia 06/24/2018  ? Dense breast tissue on mammogram 04/17/2018  ? Fibrocystic breast determined by biopsy 04/09/2018  ? Family history of breast cancer in mother 04/09/2018  ? Paroxysmal sinus tachycardia ([place]) 03/27/2018  ? Osteoporosis 08/28/2017  ? Facet arthropathy, lumbosacral 08/28/2017  ? Chronic left SI joint pain 08/27/2017  ? Left lumbar radiculopathy 08/27/2017  ? Allergic rhinitis 08/09/2017  ? Disorder of lipoid metabolism 08/09/2017  ? Insomnia secondary to anxiety 08/09/2017  ? Postmenopausal osteoporosis 08/09/2017  ? History of hysterectomy for benign disease 08/09/2017  ? GAD (generalized anxiety disorder) 08/09/2017   ? Former smoker, stopped smoking in distant past 08/09/2017  ? Minor depressive disorder 08/09/2017  ? Palpitations 03/16/2015  ? Family history of colon cancer 03/02/2015  ? Hiatal hernia with GERD without esophagitis 03/02/2015  ? History of adenomatous polyp of colon 03/02/2015  ? Chronic migraine without aura 10/10/2014  ? History of cardiovascular disorder 01/18/2003  ? ? ?REFERRING DIAG: R53.1 (ICD-10-CM) - Left-sided weakness R41.4 (ICD-10-CM) - Left-sided neglect I63.411 (ICD-10-CM) - Cerebrovascular accident (CVA) due to embolism of right middle cerebral artery (HCC) I63.9,I77.70 (ICD-10-CM) - Cerebral ischemic stroke due to arterial dissection (HCC)  ? ?THERAPY DIAG:  ?Muscle weakness (generalized) ? ?Unsteadiness on feet ? ?Repeated falls ? ?PERTINENT HISTORY: SVT, migraine headaches, PTSD, anxiety, irritable bowel syndrome, GERD, osteoporosis, sleep apnea, left sciatica.  ? ?PRECAUTIONS: Fall, L hemi, Full weightbearing R wrist, WBAT RLE  ? ?SUBJECTIVE: Pt reports her exercises are going well, is walking more at home. States her R ankle "popped" and her swelling has reduced ever since.  ? ?PAIN:  ?Are you having pain? No ?Pain Location:  ?Pain severity: 0/10  ?OBJECTIVE ? ?TODAY'S TREATMENT:  ?Pt wore compression sock on BLEs throughout session. Pt ambulated into clinic w/LBQD in LUE demonstrating step-though pattern.  ? ?Gait Training ?The following treadmill training was completed for aerobic/neural priming, endurance, symmetrical gait pattern and gait speed. Lengthy seated rest breaks taken between trials  ?- Round 1: 6:53 up to 1.2 mph. BUE support on rails throughout. Noted decreased step clearance of L foot and significant L trunk lean. Mod multimodal cues for midline orientation, forward gaze, and "quiet feet" on treadmill. CGA throughout  ?- Round 2: 4:31 up to 1.2 mph. Challenged pt to let go of RUE, min guard throughout posteriorly. Pt demonstrated decreased L knee/hip flexion and significant  scuffing of L foot, regressing to step-to pattern. Max multimodal cues for midline orientation and progression towards steppage gait pattern for increased hip/knee flexion and strength on L side.  ?- Round 3: 5:31 at 1.2 mph. BUE support throughout and no verbal cues provided. Pt able to sustain steppage gait pattern throughout for improved step clearance but exhibited significant forward lean and over reliance on BUEs.  ? ? ?PATIENT EDUCATION: ?Education details: Increasing walking program at home, using tennis ball to roll plantar fascia of RLE, additions to HEP ?Person educated: Patient ?Education method: Explanation and Handouts ?Education comprehension: verbalized understanding and needs further education ? ? ?HOME EXERCISE PROGRAM: ?Access Code: [email or web address] ?URL: [email or web address] ?Date: 02/05/2022 ?Prepared by: Mickie Bail Davidmichael Zarazua ? ?Exercises ?- Standing March with Counter Support  - 1 x daily - 7 x weekly - 3 sets - 10 reps ?- Proper Sit to Stand Technique  - 1 x daily - 7 x weekly - 3 sets - 10 reps ?- Calf stretch  - 1 x daily -  7 x weekly - 3 sets - 10 reps - 30s hold ?- Heel Raises with Counter Support  - 1 x daily - 7 x weekly - 3 sets - 10 reps - 3s hold ?- Standing Hip Abduction with Unilateral Counter Support  - 1 x daily - 7 x weekly - 3 sets - 10 reps ? ? ? ? PT Short Term Goals - 01/09/22 1210   ? ?  ? PT SHORT TERM GOAL #1  ? Title Pt will be independent and compliant with initial HEP in order to improve functional mobility.  (Target Date: 01/13/22)   ? Time 4   ? Period Weeks   ? Status Achieved   ? Target Date 01/13/22   ?  ? PT SHORT TERM GOAL #2  ? Title Pt will perform lateral scoot transfers both directions at min A level maintaining NWB precautions without cues.   ? Time 4   ? Period Weeks   ? Status Achieved   ?  ? PT SHORT TERM GOAL #3  ? Title Will assess and address any vertigo that may be present in order to dec dizziness.   ? Time 4   ? Period Weeks   ? Status Achieved   ?   ? PT SHORT TERM GOAL #4  ? Title Pt will perform sit<>stand at min A level maintaining NWB precautions.   ? Time 4   ? Period Weeks   ? Status Achieved   ?  ? PT SHORT TERM GOAL #5  ? Title Pt will rep

## 2022-02-12 ENCOUNTER — Ambulatory Visit: Payer: No Typology Code available for payment source

## 2022-02-12 ENCOUNTER — Ambulatory Visit: Payer: No Typology Code available for payment source | Admitting: Occupational Therapy

## 2022-02-12 ENCOUNTER — Ambulatory Visit: Payer: No Typology Code available for payment source | Admitting: Physical Therapy

## 2022-02-12 DIAGNOSIS — R2681 Unsteadiness on feet: Secondary | ICD-10-CM

## 2022-02-12 DIAGNOSIS — M25631 Stiffness of right wrist, not elsewhere classified: Secondary | ICD-10-CM

## 2022-02-12 DIAGNOSIS — M6281 Muscle weakness (generalized): Secondary | ICD-10-CM | POA: Diagnosis not present

## 2022-02-12 DIAGNOSIS — R471 Dysarthria and anarthria: Secondary | ICD-10-CM

## 2022-02-12 DIAGNOSIS — R2689 Other abnormalities of gait and mobility: Secondary | ICD-10-CM

## 2022-02-12 NOTE — Therapy (Signed)
[facility] ?[facility] ?[place], [place], [address] ?Phone: [phone]   Fax:  [phone] ? ?Occupational Therapy Treatment ? ?Patient Details  ?Name: Alice Williams ?MRN: [id number] ?Date of Birth: July 19, 1961 ?Referring Provider (OT): Dr. Sarina Ill ? ? ?Encounter Date: 02/12/2022 ? ? OT End of Session - 02/12/22 1104   ? ? Visit Number 9   ? Number of Visits 25   ? Date for OT Re-Evaluation 03/25/22   ? Authorization Type [facility] 2023  copay $40 per visit  No Ded , VL: [place]  Auth Reqd: after 12th visit   ? Authorization - Visit Number 8   corrected count  ? Authorization - Number of Visits 12   ? OT Start Time 1105   ? OT Stop Time 1145   ? OT Time Calculation (min) 40 min   ? Activity Tolerance Patient tolerated treatment well   ? Behavior During Therapy [facility] for tasks assessed/performed   ? ?  ?  ? ?  ? ? ?Past Medical History:  ?Diagnosis Date  ? Anal fissure   ? Anemia   ? in early 20's  ? Anxiety   ? Arthritis   ? back  ? Breast calcifications on mammogram   ? Cancer ([place])   ? pre cancer colon,cervix  ? Colon polyp   ? Dyslipidemia 06/24/2018  ? Elevated cholesterol   ? Facet arthropathy, lumbosacral 08/28/2017  ? Family history of adverse reaction to anesthesia   ? sibling has N/V  ? GERD (gastroesophageal reflux disease)   ? Heart murmur   ? History of colon polyps   ? History of kidney stones   ? has small ones  ? IBS (irritable bowel syndrome)   ? Inappropriate sinus tachycardia   ? Migraines   ? Osteopenia   ? Osteoporosis of femur without pathological fracture 08/28/2017  ? Palpitations   ? PONV (postoperative nausea and vomiting)   ? Premature surgical menopause   ? PTSD (post-traumatic stress disorder)   ? Sleep apnea   ? ? ?Past Surgical History:  ?Procedure Laterality Date  ? ANAL FISSURE REPAIR    ? APPENDECTOMY  2000  ? COLONOSCOPY W/ POLYPECTOMY  03/2015  ? COLONOSCOPY WITH ESOPHAGOGASTRODUODENOSCOPY (EGD)  04/01/2015  ? KNEE SURGERY     ? OOPHORECTOMY    ? OOPHORECTOMY    ? 1995, 1998  ? ORIF ANKLE FRACTURE Right 11/28/2021  ? Procedure: OPEN REDUCTION INTERNAL FIXATION (ORIF) ANKLE FRACTURE;  Surgeon: Meredith Pel, MD;  Location: [place];  Service: Orthopedics;  Laterality: Right;  ? PELVIC LAPAROSCOPY    ? x 4 for endometriosis   ? VAGINAL HYSTERECTOMY  1994  ? ? ?There were no vitals filed for this visit. ? ? Subjective Assessment - 02/12/22 1105   ? ? Subjective  I've cooked dinner a couple of times, walked on the indoor track, and taken a few showers w/ my shower chair. My shoulder is better too   ? Limitations 01/05/22- received after therapy session was completed - per MD-Okay for AROM and PROM of right wrist. Okay for strengthening exercises in the brace  We will dc brace in ~2 weeks as long as she is doing okay on exam at that point, (pt is cleared for walker use and WB through RLE also)   ? Patient Stated Goals get back to where I was before (return to living alone and go back to work)   ? Currently in Pain? No/denies   ?  Pain Onset More than a month ago   ? ?  ?  ? ?  ? ?Upgraded putty to red resistance and reviewed. Pt return demo.  ?Verbally reviewed sh HEP - pt reports going well and Rt shoulder is better. Only pain when laying on it.  ? ?Pt performed "Organizing Your Day" task for executive functioning (sequencing, organizing, problem solving) simple task w/ 100% accuracy; more complex task w/ 1 errand omitted.  ? ?Side stepping along counter RT/Lt without hand support. Wt shifting onto RLE reaching outside BOS to higher shelf and to get items off floor w/ one hand support.  ? ?UBE x 8 min, level 3.5 for UB conditioning/endurance ? ?Pt reports wearing flip flops in shower because shower floor is slippery. Recommended shower stickers and wearing water shoes vs. Flip flops to prevent slips/falls. Also recommended another grab bar (vertical) placed for getting in/out shower safely ? ? ? ? ? ? ? ? ? ? ? ? ? ? ? ? ? ? ? ? ? ? ? OT  Short Term Goals - 01/29/22 1115   ? ?  ? OT SHORT TERM GOAL #1  ? Title Pt will be independent with initial HEP for strength/activity tolerance.--check STGs 01/23/22   ? Time 4   ? Period Weeks   ? Status Achieved   ?  ? OT SHORT TERM GOAL #2  ? Title Pt will be independent with initial HEP for R wrist ROM (once cleared by MD).   ? Time 4   ? Period Weeks   ? Status Achieved   ?  ? OT SHORT TERM GOAL #3  ? Title Pt will perform BADLs mod I.   ? Time 4   ? Period Weeks   ? Status Achieved   ?  ? OT SHORT TERM GOAL #4  ? Title Pt will perform simple home maintenance/snack prep with supervision.   ? Time 4   ? Period Weeks   ? Status On-going   ? ?  ?  ? ?  ? ? ? ? OT Long Term Goals - 01/29/22 1117   ? ?  ? OT LONG TERM GOAL #1  ? Title Pt will be independent with updated strengthening HEP for bilateral UEs/R wrist (once cleared by MD).--check LTGs 03/25/22   ? Time 12   ? Period Weeks   ? Status On-going   ?  ? OT LONG TERM GOAL #2  ? Title Pt will perform simple clooking and cleaning tasks mod I.   ? Time 12   ? Period Weeks   ? Status New   ?  ? OT LONG TERM GOAL #3  ? Title Pt will be able to complete IADL task for at least 73mn prior to rest break.   ? Time 12   ? Period Weeks   ? Status New   ?  ? OT LONG TERM GOAL #4  ? Title Pt will demo at least 35lbs R grip strength for lifting/carrying objects and opening containers.   ? Time 12   ? Period Weeks   ? Status On-going   01/29/22: Rt = 19.1 lbs, Lt = 44.7 lbs  ?  ? OT LONG TERM GOAL #5  ? Title Pt will demo at least 60* wrist flex/ext for ADLs/IADLs.   ? Time 12   ? Period Weeks   ? Status Achieved   flex = 65*, ext = 60*  ? ?  ?  ? ?  ? ? ? ? ? ? ? ?  Plan - 02/12/22 1139   ? ? Clinical Impression Statement Pt improving w/ functional balance for light meal prep. Pt reports Rt shoulder better.   ? OT Occupational Profile and History Detailed Assessment- Review of Records and additional review of physical, cognitive, psychosocial history related to current  functional performance   ? Occupational performance deficits (Please refer to evaluation for details): ADL's;IADL's;Work;Leisure   ? Body Structure / Function / Physical Skills ADL;Decreased knowledge of use of DME;Strength;Balance;UE functional use;ROM;IADL;Endurance;Mobility   ? Rehab Potential Good   ? Clinical Decision Making Several treatment options, min-mod task modification necessary   ? Comorbidities Affecting Occupational Performance: May have comorbidities impacting occupational performance   ? Modification or Assistance to Complete Evaluation  No modification of tasks or assist necessary to complete eval   ? OT Frequency 2x / week   ? OT Duration 12 weeks   +eval, may modify based on scheduling needs/when precautions are lifted  ? OT Treatment/Interventions Self-care/ADL training;Moist Heat;Fluidtherapy;DME and/or AE instruction;Splinting;Therapeutic activities;Balance training;Contrast Bath;Ultrasound;Therapeutic exercise;Passive range of motion;Functional Mobility Training;Neuromuscular education;Cryotherapy;Electrical Stimulation;Paraffin;Manual Therapy;Patient/family education;Energy conservation   ? Plan gripper activity (level 1), wrist strengthening w/ 3 lb weight if tolerated, simulate shower transfer   ? Consulted and Agree with Plan of Care Patient   ? ?  ?  ? ?  ? ? ?Patient will benefit from skilled therapeutic intervention in order to improve the following deficits and impairments:   ?Body Structure / Function / Physical Skills: ADL, Decreased knowledge of use of DME, Strength, Balance, UE functional use, ROM, IADL, Endurance, Mobility ?  ?  ? ? ?Visit Diagnosis: ?Unsteadiness on feet ? ?Muscle weakness (generalized) ? ?Stiffness of right wrist, not elsewhere classified ? ? ? ?Problem List ?Patient Active Problem List  ? Diagnosis Date Noted  ? Acute right MCA stroke ([place]) 12/07/2021  ? GERD without esophagitis 12/07/2021  ? Right wrist fracture, with routine healing, subsequent encounter  12/07/2021  ? Bimalleolar ankle fracture, right, closed, with routine healing, subsequent encounter   ? Blepharospasm 11/08/2021  ? Sleep disorder, circadian, shift work type 08/01/2021  ? Excessive daytime sleepin

## 2022-02-12 NOTE — Therapy (Signed)
?OUTPATIENT PHYSICAL THERAPY TREATMENT NOTE ? ? ?Patient Name: Alice Williams ?MRN: [id number] ?DOB:May 08, 1961, 61 y.o., female ?Today's Date: 02/12/2022 ? ?PCP: Mackie Pai, PA-C ?REFERRING PROVIDER: Melvenia Beam, MD  ? ? PT End of Session - 02/12/22 1015   ? ? Visit Number 11   ? Number of Visits 25   ? Date for PT Re-Evaluation 03/14/22   ? Authorization Type [facility] Needs auth after 12th visit   ? Authorization - Visit Number 1   ? Authorization - Number of Visits 12   ? PT Start Time 1014   ? PT Stop Time 1101   ? PT Time Calculation (min) 47 min   ? Activity Tolerance Patient tolerated treatment well   Orthostatic  ? Behavior During Therapy [facility] for tasks assessed/performed   ? ?  ?  ? ?  ? ? ? ? ? ? ? ? ? ? ?Past Medical History:  ?Diagnosis Date  ? Anal fissure   ? Anemia   ? in early 20's  ? Anxiety   ? Arthritis   ? back  ? Breast calcifications on mammogram   ? Cancer ([place])   ? pre cancer colon,cervix  ? Colon polyp   ? Dyslipidemia 06/24/2018  ? Elevated cholesterol   ? Facet arthropathy, lumbosacral 08/28/2017  ? Family history of adverse reaction to anesthesia   ? sibling has N/V  ? GERD (gastroesophageal reflux disease)   ? Heart murmur   ? History of colon polyps   ? History of kidney stones   ? has small ones  ? IBS (irritable bowel syndrome)   ? Inappropriate sinus tachycardia   ? Migraines   ? Osteopenia   ? Osteoporosis of femur without pathological fracture 08/28/2017  ? Palpitations   ? PONV (postoperative nausea and vomiting)   ? Premature surgical menopause   ? PTSD (post-traumatic stress disorder)   ? Sleep apnea   ? ?Past Surgical History:  ?Procedure Laterality Date  ? ANAL FISSURE REPAIR    ? APPENDECTOMY  2000  ? COLONOSCOPY W/ POLYPECTOMY  03/2015  ? COLONOSCOPY WITH ESOPHAGOGASTRODUODENOSCOPY (EGD)  04/01/2015  ? KNEE SURGERY    ? OOPHORECTOMY    ? OOPHORECTOMY    ? 1995, 1998  ? ORIF ANKLE FRACTURE Right 11/28/2021  ? Procedure: OPEN REDUCTION INTERNAL FIXATION (ORIF) ANKLE  FRACTURE;  Surgeon: Meredith Pel, MD;  Location: [place];  Service: Orthopedics;  Laterality: Right;  ? PELVIC LAPAROSCOPY    ? x 4 for endometriosis   ? VAGINAL HYSTERECTOMY  1994  ? ?Patient Active Problem List  ? Diagnosis Date Noted  ? Acute right MCA stroke ([place]) 12/07/2021  ? GERD without esophagitis 12/07/2021  ? Right wrist fracture, with routine healing, subsequent encounter 12/07/2021  ? Bimalleolar ankle fracture, right, closed, with routine healing, subsequent encounter   ? Blepharospasm 11/08/2021  ? Sleep disorder, circadian, shift work type 08/01/2021  ? Excessive daytime sleepiness 08/01/2021  ? Morning headache 02/16/2021  ? Left eye pain 02/16/2021  ? Intractable episodic cluster headache 02/16/2021  ? Behavioral insomnia of childhood 02/16/2021  ? Retrognathia 02/16/2021  ? Chronic migraine without aura, with intractable migraine, so stated, with status migrainosus 01/12/2021  ? Muscle spasm 10/14/2020  ? Vitamin D deficiency 08/15/2020  ? Acute bilateral low back pain with bilateral sciatica 07/13/2020  ? Gastric polyp 02/11/2019  ? Sessile colonic polyp 02/11/2019  ? Chronic fatigue 02/11/2019  ? Grade I internal hemorrhoids 02/11/2019  ? Ductal hyperplasia  of breast 08/21/2018  ? Cognitive changes 06/24/2018  ? Dyslipidemia 06/24/2018  ? Dense breast tissue on mammogram 04/17/2018  ? Fibrocystic breast determined by biopsy 04/09/2018  ? Family history of breast cancer in mother 04/09/2018  ? Paroxysmal sinus tachycardia ([place]) 03/27/2018  ? Osteoporosis 08/28/2017  ? Facet arthropathy, lumbosacral 08/28/2017  ? Chronic left SI joint pain 08/27/2017  ? Left lumbar radiculopathy 08/27/2017  ? Allergic rhinitis 08/09/2017  ? Disorder of lipoid metabolism 08/09/2017  ? Insomnia secondary to anxiety 08/09/2017  ? Postmenopausal osteoporosis 08/09/2017  ? History of hysterectomy for benign disease 08/09/2017  ? GAD (generalized anxiety disorder) 08/09/2017  ? Former smoker, stopped smoking in  distant past 08/09/2017  ? Minor depressive disorder 08/09/2017  ? Palpitations 03/16/2015  ? Family history of colon cancer 03/02/2015  ? Hiatal hernia with GERD without esophagitis 03/02/2015  ? History of adenomatous polyp of colon 03/02/2015  ? Chronic migraine without aura 10/10/2014  ? History of cardiovascular disorder 01/18/2003  ? ? ?REFERRING DIAG: R53.1 (ICD-10-CM) - Left-sided weakness R41.4 (ICD-10-CM) - Left-sided neglect I63.411 (ICD-10-CM) - Cerebrovascular accident (CVA) due to embolism of right middle cerebral artery (HCC) I63.9,I77.70 (ICD-10-CM) - Cerebral ischemic stroke due to arterial dissection (HCC)  ? ?THERAPY DIAG:  ?Unsteadiness on feet ? ?Muscle weakness (generalized) ? ?Other abnormalities of gait and mobility ? ?PERTINENT HISTORY: SVT, migraine headaches, PTSD, anxiety, irritable bowel syndrome, GERD, osteoporosis, sleep apnea, left sciatica.  ? ?PRECAUTIONS: Fall, L hemi, Full weightbearing R wrist, WBAT RLE  ? ?SUBJECTIVE: Pt reports she cooked dinner twice this weekend and walked around [facility] the [facility]. Able to take a few steps at home without using SPC.  ? ?PAIN:  ?Are you having pain? No ?Pain Location:  ?Pain severity: 0/10  ?OBJECTIVE ? ?TODAY'S TREATMENT:  ?Pt wore compression sock on BLEs throughout session. Pt ambulated into clinic w/LBQD in LUE demonstrating step-though pattern.  ? ?Ther Act  ?Completed outcomes assessment for updating STG and LTGS (See below)  ? [facility] PT Assessment - 02/12/22 1029   ? ?  ? Transfers  ? Five time sit to stand comments  16.75s w/BUE support   ?  ? Ambulation/Gait  ? Gait velocity 1.44 ft/s w/LBQC   ?  ? Balance  ? Balance Assessed Yes   ?  ? Standardized Balance Assessment  ? Standardized Balance Assessment Berg Balance Test;Timed Up and Go Test   ?  ? Berg Balance Test  ? Sit to Stand Able to stand without using hands and stabilize independently   ? Standing Unsupported Able to stand safely 2 minutes   ? Sitting with Back Unsupported  but Feet Supported on Floor or Stool Able to sit safely and securely 2 minutes   ? Stand to Sit Sits safely with minimal use of hands   ? Transfers Able to transfer with verbal cueing and /or supervision   ? Standing Unsupported with Eyes Closed Able to stand 10 seconds with supervision   ? Standing Unsupported with Feet Together Able to place feet together independently and stand for 1 minute with supervision   ? From Standing, Reach Forward with Outstretched Arm Can reach forward >12 cm safely (5")   8"  ? From Standing Position, Pick up Object from Floor Able to pick up shoe, needs supervision   ? From Standing Position, Turn to Look Behind Over each Shoulder Looks behind from both sides and weight shifts well   ? Turn 360 Degrees Able to turn  360 degrees safely but slowly   ? Standing Unsupported, Alternately Place Feet on Step/Stool Able to complete 4 steps without aid or supervision   Completed 8 steps but needed S*  ? Standing Unsupported, One Foot in Front Able to take small step independently and hold 30 seconds   ? Standing on One Leg Tries to lift leg/unable to hold 3 seconds but remains standing independently   ? Total Score 41   ? Berg comment: significant fall risk   ?  ? Timed Up and Go Test  ? Normal TUG (seconds) 21.54   w/LBQC  ? ?  ?  ? ?  ? ? ? ? ? ?PATIENT EDUCATION: ?Education details: STG and LTG assessment/updates, outcomes assessment  ?Person educated: Patient ?Education method: Explanation and Demonstration ?Education comprehension: verbalized understanding and needs further education ? ? ?HOME EXERCISE PROGRAM: ?Access Code: [email or web address] ?URL: [email or web address] ?Date: 02/05/2022 ?Prepared by: Mickie Bail Tangee Marszalek ? ?Exercises ?- Standing March with Counter Support  - 1 x daily - 7 x weekly - 3 sets - 10 reps ?- Proper Sit to Stand Technique  - 1 x daily - 7 x weekly - 3 sets - 10 reps ?- Calf stretch  - 1 x daily - 7 x weekly - 3 sets - 10 reps - 30s hold ?- Heel Raises with  Counter Support  - 1 x daily - 7 x weekly - 3 sets - 10 reps - 3s hold ?- Standing Hip Abduction with Unilateral Counter Support  - 1 x daily - 7 x weekly - 3 sets - 10 reps ? ? ? ? PT Short Term Goals - 01/09/22 1

## 2022-02-12 NOTE — Therapy (Signed)
[facility], [place], [address] ?Phone: [phone]   Fax:  [phone] ? ?Speech Language Pathology Treatment ? ?Patient Details  ?Name: Alice Williams ?MRN: [id number] ?Date of Birth: 25-Dec-1960 ?Referring Provider (SLP): Dr. Rosalin Williams ? ? ?Encounter Date: 02/12/2022 ? ? End of Session - 02/12/22 1149   ? ? Visit Number 10   ? Number of Visits 25   ? Date for SLP Re-Evaluation 03/23/22   ? Authorization Type needs auth after 12 visits per discipline   ? Authorization - Visit Number 9   ? Authorization - Number of Visits 12   ? SLP Start Time 1150   ? SLP Stop Time  1230   ? SLP Time Calculation (min) 40 min   ? Activity Tolerance Patient tolerated treatment well   ? ?  ?  ? ?  ? ? ?Past Medical History:  ?Diagnosis Date  ? Anal fissure   ? Anemia   ? in early 20's  ? Anxiety   ? Arthritis   ? back  ? Breast calcifications on mammogram   ? Cancer ([place])   ? pre cancer colon,cervix  ? Colon polyp   ? Dyslipidemia 06/24/2018  ? Elevated cholesterol   ? Facet arthropathy, lumbosacral 08/28/2017  ? Family history of adverse reaction to anesthesia   ? sibling has N/V  ? GERD (gastroesophageal reflux disease)   ? Heart murmur   ? History of colon polyps   ? History of kidney stones   ? has small ones  ? IBS (irritable bowel syndrome)   ? Inappropriate sinus tachycardia   ? Migraines   ? Osteopenia   ? Osteoporosis of femur without pathological fracture 08/28/2017  ? Palpitations   ? PONV (postoperative nausea and vomiting)   ? Premature surgical menopause   ? PTSD (post-traumatic stress disorder)   ? Sleep apnea   ? ? ?Past Surgical History:  ?Procedure Laterality Date  ? ANAL FISSURE REPAIR    ? APPENDECTOMY  2000  ? COLONOSCOPY W/ POLYPECTOMY  03/2015  ? COLONOSCOPY WITH ESOPHAGOGASTRODUODENOSCOPY (EGD)  04/01/2015  ? KNEE SURGERY    ? OOPHORECTOMY    ? OOPHORECTOMY    ? 1995, 1998  ? ORIF ANKLE FRACTURE Right 11/28/2021  ? Procedure: OPEN  REDUCTION INTERNAL FIXATION (ORIF) ANKLE FRACTURE;  Surgeon: Alice Pel, MD;  Location: [facility];  Service: Orthopedics;  Laterality: Right;  ? PELVIC LAPAROSCOPY    ? x 4 for endometriosis   ? VAGINAL HYSTERECTOMY  1994  ? ? ?There were no vitals filed for this visit. ? ? Subjective Assessment - 02/12/22 1151   ? ? Subjective "It's been much better" re: voice/speech   ? Currently in Pain? No/denies   ? ?  ?  ? ?  ? ? ? ? ? ? ? ? ADULT SLP TREATMENT - 02/12/22 1213   ? ?  ? General Information  ? Behavior/Cognition Alert;Cooperative;Pleasant mood   ?  ? Treatment Provided  ? Treatment provided Cognitive-Linquistic   ?  ? Cognitive-Linquistic Treatment  ? Treatment focused on Dysarthria;Patient/family/caregiver education   ? Skilled Treatment Pt entered with WNL vocal quality and clarity. Pt stated "they hear me all the time now." Improved carryover of increased vocal intensity and clarity reported at home with implementation of HEP. Today, in unstructured conversation with background noise, rare min A required to optimize volume and clarity (pt self-implemented water breaks secondary to post-nasal drip and as  throat clear alternative). Will monitor carryover of improved speech until next session and will likely discharge then as pt is pleased with current progress.   ?  ? Assessment / Recommendations / Plan  ? Plan Continue with current plan of care   likely d/c next session if good carryover persists  ?  ? Progression Toward Goals  ? Progression toward goals Progressing toward goals   ? ?  ?  ? ?  ? ? ? SLP Education - 02/12/22 1301   ? ? Education Details HEP   ? Person(s) Educated Patient   ? Methods Explanation;Demonstration   ? Comprehension Verbalized understanding;Returned demonstration;Need further instruction   ? ?  ?  ? ?  ? ? ? SLP Short Term Goals - 02/12/22 1149   ? ?  ? SLP SHORT TERM GOAL #1  ? Title Pt will complete loud AH with average of 85dB over 2 sessions with rare min A   ? Baseline  01-01-22, 01-18-22   ? Status Achieved   ?  ? SLP SHORT TERM GOAL #2  ? Title Pt will complete HEP for voice/dysarthria (PHoRTE) with rare min A over 2 sessions   ? Baseline 01-18-22, 02-01-22   ? Status Achieved   ?  ? SLP SHORT TERM GOAL #3  ? Title Pt will average 72dB 18/20 sentences in structured speech tasks   ? Baseline 01-01-22, 01-18-22   ? Status Achieved   ?  ? SLP SHORT TERM GOAL #4  ? Title Pt will maintain average of 70dB and clear phonation over 8 minute conversation with occasional min A   ? Baseline 02-01-22; 02-08-22   ? Status Achieved   ? ?  ?  ? ?  ? ? ? SLP Long Term Goals - 02/12/22 1150   ? ?  ? SLP LONG TERM GOAL #1  ? Title Pt will complete HEP for dysarthria with mod I   ? Baseline 02-12-22   ? Time 6   ? Period Weeks   ? Status On-going   ?  ? SLP LONG TERM GOAL #2  ? Title Pt will self correct low, hoarse voice with rare min A as needed over 2 sessions   ? Baseline 02-01-22, 02-12-22   ? Time 6   ? Period Weeks   ? Status Achieved   ?  ? SLP LONG TERM GOAL #3  ? Title Pt will average 70dB over 15 minute conversation with rare min A   ? Baseline 02-12-22   ? Time 6   ? Period Weeks   ? Status On-going   ?  ? SLP LONG TERM GOAL #4  ? Title Pt will be 100% intelligible in noisy environment with rare min A over 15 minute conversation   ? Baseline 02-12-22   ? Time 6   ? Period Weeks   ? Status On-going   ?  ? SLP LONG TERM GOAL #5  ? Title Pt will improve score on Voice Related Quality of Life Measure (VRQOL) raw score by 2 points   ? Baseline raw score - 20; calculated score - 75   ? Time 6   ? Period Weeks   ? Status On-going   ? ?  ?  ? ?  ? ? ? Plan - 02/12/22 1149   ? ? Clinical Impression Statement Alice Williams presents with improving mild hypokinetic dysarthria c/b more consistent WNL volume and clarity. SLP implemented ongoing education and instruction of dysarthria HEP exercises  to optimize vocal quality + intensity and reduce tension/roughness, with good carryover exhibited thus far. Improving awareness  and carryover of forward focused phonation and intentional voicing exhibited on structured and unstructured speech tasks. If progress continues, pt will likely discharge next session as pt is now pleased with current speech production. Pt would benefit from skilled ST intervention to optimize speech intelligilbility and communication effectiveness.   ? Speech Therapy Frequency 2x / week   ? Duration 12 weeks   ? Treatment/Interventions SLP instruction and feedback;Compensatory strategies;Functional tasks;Patient/family education;Multimodal communcation approach;Internal/external aids;Environmental controls   ? Potential to Achieve Goals Good   ? ?  ?  ? ?  ? ? ?Patient will benefit from skilled therapeutic intervention in order to improve the following deficits and impairments:   ?Dysarthria and anarthria ? ? ? ?Problem List ?Patient Active Problem List  ? Diagnosis Date Noted  ? Acute right MCA stroke ([place]) 12/07/2021  ? GERD without esophagitis 12/07/2021  ? Right wrist fracture, with routine healing, subsequent encounter 12/07/2021  ? Bimalleolar ankle fracture, right, closed, with routine healing, subsequent encounter   ? Blepharospasm 11/08/2021  ? Sleep disorder, circadian, shift work type 08/01/2021  ? Excessive daytime sleepiness 08/01/2021  ? Morning headache 02/16/2021  ? Left eye pain 02/16/2021  ? Intractable episodic cluster headache 02/16/2021  ? Behavioral insomnia of childhood 02/16/2021  ? Retrognathia 02/16/2021  ? Chronic migraine without aura, with intractable migraine, so stated, with status migrainosus 01/12/2021  ? Muscle spasm 10/14/2020  ? Vitamin D deficiency 08/15/2020  ? Acute bilateral low back pain with bilateral sciatica 07/13/2020  ? Gastric polyp 02/11/2019  ? Sessile colonic polyp 02/11/2019  ? Chronic fatigue 02/11/2019  ? Grade I internal hemorrhoids 02/11/2019  ? Ductal hyperplasia of breast 08/21/2018  ? Cognitive changes 06/24/2018  ? Dyslipidemia 06/24/2018  ? Dense breast  tissue on mammogram 04/17/2018  ? Fibrocystic breast determined by biopsy 04/09/2018  ? Family history of breast cancer in mother 04/09/2018  ? Paroxysmal sinus tachycardia ([place]) 03/27/2018  ? Osteoporosis 10/

## 2022-02-15 ENCOUNTER — Encounter: Payer: Self-pay | Admitting: Occupational Therapy

## 2022-02-15 ENCOUNTER — Ambulatory Visit: Payer: No Typology Code available for payment source | Admitting: Physical Therapy

## 2022-02-15 ENCOUNTER — Ambulatory Visit: Payer: No Typology Code available for payment source | Admitting: Occupational Therapy

## 2022-02-15 DIAGNOSIS — M25631 Stiffness of right wrist, not elsewhere classified: Secondary | ICD-10-CM

## 2022-02-15 DIAGNOSIS — R2689 Other abnormalities of gait and mobility: Secondary | ICD-10-CM

## 2022-02-15 DIAGNOSIS — R2681 Unsteadiness on feet: Secondary | ICD-10-CM

## 2022-02-15 DIAGNOSIS — M6281 Muscle weakness (generalized): Secondary | ICD-10-CM

## 2022-02-15 DIAGNOSIS — I69354 Hemiplegia and hemiparesis following cerebral infarction affecting left non-dominant side: Secondary | ICD-10-CM

## 2022-02-15 NOTE — Therapy (Signed)
?OUTPATIENT PHYSICAL THERAPY TREATMENT NOTE ? ? ?Patient Name: Alice Williams ?MRN: [id number] ?DOB:1961-10-22, 61 y.o., female ?Today's Date: 02/15/2022 ? ?PCP: Mackie Pai, PA-C ?REFERRING PROVIDER: Melvenia Beam, MD  ? ? PT End of Session - 02/15/22 1021   ? ? Visit Number 12   ? Number of Visits 25   ? Date for PT Re-Evaluation 03/14/22   ? Authorization Type [facility] ([facility]) Needs auth after 12th visit   ? Authorization - Visit Number 1   ? Authorization - Number of Visits 12   ? PT Start Time 1019   ? PT Stop Time 1100   ? PT Time Calculation (min) 41 min   ? Activity Tolerance Patient tolerated treatment well   Orthostatic  ? Behavior During Therapy [facility] for tasks assessed/performed   ? ?  ?  ? ?  ? ? ? ? ? ? ? ? ? ? ?Past Medical History:  ?Diagnosis Date  ? Anal fissure   ? Anemia   ? in early 20's  ? Anxiety   ? Arthritis   ? back  ? Breast calcifications on mammogram   ? Cancer ([place])   ? pre cancer colon,cervix  ? Colon polyp   ? Dyslipidemia 06/24/2018  ? Elevated cholesterol   ? Facet arthropathy, lumbosacral 08/28/2017  ? Family history of adverse reaction to anesthesia   ? sibling has N/V  ? GERD (gastroesophageal reflux disease)   ? Heart murmur   ? History of colon polyps   ? History of kidney stones   ? has small ones  ? IBS (irritable bowel syndrome)   ? Inappropriate sinus tachycardia   ? Migraines   ? Osteopenia   ? Osteoporosis of femur without pathological fracture 08/28/2017  ? Palpitations   ? PONV (postoperative nausea and vomiting)   ? Premature surgical menopause   ? PTSD (post-traumatic stress disorder)   ? Sleep apnea   ? ?Past Surgical History:  ?Procedure Laterality Date  ? ANAL FISSURE REPAIR    ? APPENDECTOMY  2000  ? COLONOSCOPY W/ POLYPECTOMY  03/2015  ? COLONOSCOPY WITH ESOPHAGOGASTRODUODENOSCOPY (EGD)  04/01/2015  ? KNEE SURGERY    ? OOPHORECTOMY    ? OOPHORECTOMY    ? 1995, 1998  ? ORIF ANKLE FRACTURE Right 11/28/2021  ? Procedure: OPEN REDUCTION INTERNAL FIXATION (ORIF) ANKLE  FRACTURE;  Surgeon: Meredith Pel, MD;  Location: [place];  Service: Orthopedics;  Laterality: Right;  ? PELVIC LAPAROSCOPY    ? x 4 for endometriosis   ? VAGINAL HYSTERECTOMY  1994  ? ?Patient Active Problem List  ? Diagnosis Date Noted  ? Acute right MCA stroke ([place]) 12/07/2021  ? GERD without esophagitis 12/07/2021  ? Right wrist fracture, with routine healing, subsequent encounter 12/07/2021  ? Bimalleolar ankle fracture, right, closed, with routine healing, subsequent encounter   ? Blepharospasm 11/08/2021  ? Sleep disorder, circadian, shift work type 08/01/2021  ? Excessive daytime sleepiness 08/01/2021  ? Morning headache 02/16/2021  ? Left eye pain 02/16/2021  ? Intractable episodic cluster headache 02/16/2021  ? Behavioral insomnia of childhood 02/16/2021  ? Retrognathia 02/16/2021  ? Chronic migraine without aura, with intractable migraine, so stated, with status migrainosus 01/12/2021  ? Muscle spasm 10/14/2020  ? Vitamin D deficiency 08/15/2020  ? Acute bilateral low back pain with bilateral sciatica 07/13/2020  ? Gastric polyp 02/11/2019  ? Sessile colonic polyp 02/11/2019  ? Chronic fatigue 02/11/2019  ? Grade I internal hemorrhoids 02/11/2019  ? Ductal hyperplasia  of breast 08/21/2018  ? Cognitive changes 06/24/2018  ? Dyslipidemia 06/24/2018  ? Dense breast tissue on mammogram 04/17/2018  ? Fibrocystic breast determined by biopsy 04/09/2018  ? Family history of breast cancer in mother 04/09/2018  ? Paroxysmal sinus tachycardia ([place]) 03/27/2018  ? Osteoporosis 08/28/2017  ? Facet arthropathy, lumbosacral 08/28/2017  ? Chronic left SI joint pain 08/27/2017  ? Left lumbar radiculopathy 08/27/2017  ? Allergic rhinitis 08/09/2017  ? Disorder of lipoid metabolism 08/09/2017  ? Insomnia secondary to anxiety 08/09/2017  ? Postmenopausal osteoporosis 08/09/2017  ? History of hysterectomy for benign disease 08/09/2017  ? GAD (generalized anxiety disorder) 08/09/2017  ? Former smoker, stopped smoking in  distant past 08/09/2017  ? Minor depressive disorder 08/09/2017  ? Palpitations 03/16/2015  ? Family history of colon cancer 03/02/2015  ? Hiatal hernia with GERD without esophagitis 03/02/2015  ? History of adenomatous polyp of colon 03/02/2015  ? Chronic migraine without aura 10/10/2014  ? History of cardiovascular disorder 01/18/2003  ? ? ?REFERRING DIAG: R53.1 (ICD-10-CM) - Left-sided weakness R41.4 (ICD-10-CM) - Left-sided neglect I63.411 (ICD-10-CM) - Cerebrovascular accident (CVA) due to embolism of right middle cerebral artery (HCC) I63.9,I77.70 (ICD-10-CM) - Cerebral ischemic stroke due to arterial dissection (HCC)  ? ?THERAPY DIAG:  ?Unsteadiness on feet ? ?Muscle weakness (generalized) ? ?Other abnormalities of gait and mobility ? ?PERTINENT HISTORY: SVT, migraine headaches, PTSD, anxiety, irritable bowel syndrome, GERD, osteoporosis, sleep apnea, left sciatica.  ? ?PRECAUTIONS: Fall, L hemi, Full weightbearing R wrist, WBAT RLE  ? ?SUBJECTIVE: Pt ambulated into clinic without using LQBC, very excited to be able to weight shift without UE support. Is able to cook at home and obtained shower shoes to use at home.  ? ?PAIN:  ?Are you having pain? No ?Pain Location:  ?Pain severity: 0/10  ?OBJECTIVE ? ?TODAY'S TREATMENT:  ?Pt wore compression sock on BLEs throughout session. Pt ambulated into clinic holding LBQD off ground in LUE demonstrating step-though pattern. Pt able to perform sit <>stands throughout session without BUE support for the first time.  ? ?Gait Training  ?Gait pattern: step through pattern, decreased stride length, Left foot flat, antalgic, lateral lean- Left, poor foot clearance- Right, and poor foot clearance- Left ?Distance walked: 150' ?Assistive device utilized: Single point cane ?Level of assistance: Modified independence ?Comments: Pt practiced ambulating lap in gym w/SPC in LUE, noted good cadence and improved step clearance bilaterally.  ? ?The following treadmill training was  completed for aerobic/neural priming, endurance, improved step length/clearance and gait speed.  ?-Round 1: 7:30 at 1.5 mph with unilateral UE support (alt side throughout). Noted improved step length/clearance and step-through pattern throughout, decreased lateral trunk lean to L side and improved midline orientation. Increased elevation to 1% for 4:30 at 1.5 mph for increased step clearance and quad activation, pt used unilateral UE support throughout. Noted foot flat of LLE w/increase in elevation. Total of 12 minutes for 1st round.  ?-short seated rest break taken between trials  ?-Round 2: 1:00 warmup up to 1.5 mph w/0% elevation and then 6 minutes at 2% elevation and 1.5 mph for continued bilat quad strengthening and step clearance of LLE, mix of BUE/ unilateral UE support throughout. Min cues for upright posture as pt demonstrated forward lean as she fatigued.  ? ?  ? ?PATIENT EDUCATION: ?Education details: Practicing walking without AD at home safely  ?Person educated: Patient ?Education method: Explanation and Demonstration ?Education comprehension: verbalized understanding and needs further education ? ? ?HOME EXERCISE PROGRAM: ?Access Code:  [email or web address] ?URL: [email or web address] ?Date: 02/05/2022 ?Prepared by: Mickie Bail Cadel Stairs ? ?Exercises ?- Standing March with Counter Support  - 1 x daily - 7 x weekly - 3 sets - 10 reps ?- Proper Sit to Stand Technique  - 1 x daily - 7 x weekly - 3 sets - 10 reps ?- Calf stretch  - 1 x daily - 7 x weekly - 3 sets - 10 reps - 30s hold ?- Heel Raises with Counter Support  - 1 x daily - 7 x weekly - 3 sets - 10 reps - 3s hold ?- Standing Hip Abduction with Unilateral Counter Support  - 1 x daily - 7 x weekly - 3 sets - 10 reps ? ? ? ?SHORT TERM GOALS:   Target date: 03/15/2022 ? ?Pt will ascend/descend 12 6" steps using LRAD and no rail mod I w/step-to pattern to imitate home environment and improve independence  ?Baseline:  ?Goal status: INITIAL ? ?2.  Pt will  ambulate 250' w/SPC and S* for improved functional mobility  ?Baseline: using LBQC ?Goal status: INITIAL ? ?3.  Pt will improve 5 x STS to less than or equal to 16 seconds without BUE support to demonstrate impro

## 2022-02-15 NOTE — Therapy (Signed)
[place] ?[address] ?[place], [place], [address] ?Phone: [phone]   Fax:  [phone] ? ?Occupational Therapy Treatment ? ?Patient Details  ?Name: Alice Williams ?MRN: [id number] ?Date of Birth: [birth year] ?Referring Provider (OT): Dr. Sarina Ill ? ? ?Encounter Date: 02/15/2022 ? ? OT End of Session - 02/15/22 1111   ? ? Visit Number 10   ? Number of Visits 25   ? Date for OT Re-Evaluation 03/25/22   ? Authorization Type [facility] 2023  copay $40 per visit  No Ded , VL: [place]  Auth Reqd: after 12th visit   ? Authorization - Visit Number 9   corrected count  ? Authorization - Number of Visits 12   ? OT Start Time 1108   ? OT Stop Time 1150   ? OT Time Calculation (min) 42 min   ? Activity Tolerance Patient tolerated treatment well   ? Behavior During Therapy [facility] for tasks assessed/performed   ? ?  ?  ? ?  ? ? ?Past Medical History:  ?Diagnosis Date  ? Anal fissure   ? Anemia   ? in early 20's  ? Anxiety   ? Arthritis   ? back  ? Breast calcifications on mammogram   ? Cancer ([place])   ? pre cancer colon,cervix  ? Colon polyp   ? Dyslipidemia 06/24/2018  ? Elevated cholesterol   ? Facet arthropathy, lumbosacral 08/28/2017  ? Family history of adverse reaction to anesthesia   ? sibling has N/V  ? GERD (gastroesophageal reflux disease)   ? Heart murmur   ? History of colon polyps   ? History of kidney stones   ? has small ones  ? IBS (irritable bowel syndrome)   ? Inappropriate sinus tachycardia   ? Migraines   ? Osteopenia   ? Osteoporosis of femur without pathological fracture 08/28/2017  ? Palpitations   ? PONV (postoperative nausea and vomiting)   ? Premature surgical menopause   ? PTSD (post-traumatic stress disorder)   ? Sleep apnea   ? ? ?Past Surgical History:  ?Procedure Laterality Date  ? ANAL FISSURE REPAIR    ? APPENDECTOMY  2000  ? COLONOSCOPY W/ POLYPECTOMY  03/2015  ? COLONOSCOPY WITH ESOPHAGOGASTRODUODENOSCOPY (EGD)  04/01/2015  ? KNEE SURGERY     ? OOPHORECTOMY    ? OOPHORECTOMY    ? 1995, 1998  ? ORIF ANKLE FRACTURE Right 11/28/2021  ? Procedure: OPEN REDUCTION INTERNAL FIXATION (ORIF) ANKLE FRACTURE;  Surgeon: Meredith Pel, MD;  Location: [place];  Service: Orthopedics;  Laterality: Right;  ? PELVIC LAPAROSCOPY    ? x 4 for endometriosis   ? VAGINAL HYSTERECTOMY  1994  ? ? ?There were no vitals filed for this visit. ? ? Subjective Assessment - 02/15/22 1110   ? ? Subjective  Pt reports walking some at home now without the cane.  Pt reports that she is not getting items for high overhead, carrying pots/pans.  Folding laundry (laundry is down in the basement).  Pt reports able to get in/out of shower and washes mod I (has water shoes, shower seat).   ? Limitations 01/05/22- received after therapy session was completed - per MD-Okay for AROM and PROM of right wrist. Okay for strengthening exercises in the brace  We will dc brace in ~2 weeks as long as she is doing okay on exam at that point, (pt is cleared for walker use and WB through RLE also)   ? Patient  Stated Goals get back to where I was before (return to living alone and go back to work)   ? Currently in Pain? No/denies   ? Pain Onset More than a month ago   ? ?  ?  ? ?  ? ? ? ?Picking up blocks using gripper on level 1 (black spring) for 1/2 blocks and level 2 for remaining with min-mod difficulty for level 2.   ? ?Wrist flex/ext and UD/RD with 3lb wt x15 each. ? ?Discussed progress--see subjective and goals section.  Pt reports intermittent pain in R shoulder and wrist (depending on the day).  Recommended pt discuss shoulder pain (and wrist pain) with MD at next appt (this week).  Pt reports pain with donning/doffing backpack.--Recommended pt don RUE first and doff RUE last and let LUE place/pick up backpack. ? ?Sitting, Closed-chain shoulder flex, chest press, diagonals to each side, and tricep ext  (with ER) with BUEs with ball with difficulty with tricep ext. ? ?Standing, functional reaching  incorporating lateral wt. Shift and reaching overhead and outside base of support to place/remove clothespins with 1-8lb resist with each UE for incr activity tolerance, balance, functional reach, and hand strength.   ? ? ? ? ? ? OT Short Term Goals - 02/15/22 1111   ? ?  ? OT SHORT TERM GOAL #1  ? Title Pt will be independent with initial HEP for strength/activity tolerance.--check STGs 01/23/22   ? Time 4   ? Period Weeks   ? Status Achieved   ?  ? OT SHORT TERM GOAL #2  ? Title Pt will be independent with initial HEP for R wrist ROM (once cleared by MD).   ? Time 4   ? Period Weeks   ? Status Achieved   ?  ? OT SHORT TERM GOAL #3  ? Title Pt will perform BADLs mod I.   ? Time 4   ? Period Weeks   ? Status Achieved   ?  ? OT SHORT TERM GOAL #4  ? Title Pt will perform simple home maintenance/snack prep with supervision.   ? Time 4   ? Period Weeks   ? Status Achieved   ? ?  ?  ? ?  ? ? ? ? OT Long Term Goals - 02/15/22 1112   ? ?  ? OT LONG TERM GOAL #1  ? Title Pt will be independent with updated strengthening HEP for bilateral UEs/R wrist (once cleared by MD).--check LTGs 03/25/22   ? Time 12   ? Period Weeks   ? Status On-going   ?  ? OT LONG TERM GOAL #2  ? Title Pt will perform simple clooking and cleaning tasks mod I.   ? Time 12   ? Period Weeks   ? Status On-going   ?  ? OT LONG TERM GOAL #3  ? Title Pt will be able to complete IADL task for at least 13mn prior to rest break.   ? Time 12   ? Period Weeks   ? Status On-going   02/15/22:  10-248m  ?  ? OT LONG TERM GOAL #4  ? Title Pt will demo at least 35lbs R grip strength for lifting/carrying objects and opening containers.   ? Time 12   ? Period Weeks   ? Status On-going   01/29/22: Rt = 19.1 lbs, Lt = 44.7 lbs  ?  ? OT LONG TERM GOAL #5  ? Title Pt will demo at least 60*  wrist flex/ext for ADLs/IADLs.   ? Time 12   ? Period Weeks   ? Status Achieved   flex = 65*, ext = 60*  ? ?  ?  ? ?  ? ? ? ? ? ? ? ? Plan - 02/15/22 1111   ? ? Clinical Impression  Statement Pt reports incr independence with ADLs and light IADLs.  Pt reports continued R shoulder/wrist pain at times (depends on the day).   ? OT Occupational Profile and History Detailed Assessment- Review of Records and additional review of physical, cognitive, psychosocial history related to current functional performance   ? Occupational performance deficits (Please refer to evaluation for details): ADL's;IADL's;Work;Leisure   ? Body Structure / Function / Physical Skills ADL;Decreased knowledge of use of DME;Strength;Balance;UE functional use;ROM;IADL;Endurance;Mobility   ? Rehab Potential Good   ? Clinical Decision Making Several treatment options, min-mod task modification necessary   ? Comorbidities Affecting Occupational Performance: May have comorbidities impacting occupational performance   ? Modification or Assistance to Complete Evaluation  No modification of tasks or assist necessary to complete eval   ? OT Frequency 2x / week   ? OT Duration 12 weeks   +eval, may modify based on scheduling needs/when precautions are lifted  ? OT Treatment/Interventions Self-care/ADL training;Moist Heat;Fluidtherapy;DME and/or AE instruction;Splinting;Therapeutic activities;Balance training;Contrast Bath;Ultrasound;Therapeutic exercise;Passive range of motion;Functional Mobility Training;Neuromuscular education;Cryotherapy;Electrical Stimulation;Paraffin;Manual Therapy;Patient/family education;Energy conservation   ? Plan continue to address R shoulder pain prn, R hand/wrist strengthening, balance for IADLs (auth needed after 12th visit)   ? Consulted and Agree with Plan of Care Patient   ? ?  ?  ? ?  ? ? ?Patient will benefit from skilled therapeutic intervention in order to improve the following deficits and impairments:   ?Body Structure / Function / Physical Skills: ADL, Decreased knowledge of use of DME, Strength, Balance, UE functional use, ROM, IADL, Endurance, Mobility ?  ?  ? ? ?Visit  Diagnosis: ?Unsteadiness on feet ? ?Muscle weakness (generalized) ? ?Other abnormalities of gait and mobility ? ?Stiffness of right wrist, not elsewhere classified ? ?Hemiplegia and hemiparesis following cerebral infarction affecti

## 2022-02-16 ENCOUNTER — Ambulatory Visit (INDEPENDENT_AMBULATORY_CARE_PROVIDER_SITE_OTHER): Payer: No Typology Code available for payment source

## 2022-02-16 ENCOUNTER — Ambulatory Visit (INDEPENDENT_AMBULATORY_CARE_PROVIDER_SITE_OTHER): Payer: No Typology Code available for payment source | Admitting: Orthopedic Surgery

## 2022-02-16 DIAGNOSIS — S82891A Other fracture of right lower leg, initial encounter for closed fracture: Secondary | ICD-10-CM

## 2022-02-17 ENCOUNTER — Encounter: Payer: Self-pay | Admitting: Orthopedic Surgery

## 2022-02-17 NOTE — Progress Notes (Signed)
? ?Post-Op Visit Note ?  ?Patient: Alice Williams           ?Date of Birth: [birth year]           ?MRN: [id number] ?Visit Date: 02/16/2022 ?PCP: Mackie Pai, PA-C ? ? ?Assessment & Plan: ? ?Chief Complaint:  ?Chief Complaint  ?Patient presents with  ? Right Ankle - Routine Post Op  ?  11/28/21 (11w 3d) Open Reduction Internal Fixation (orif) Ankle Fracture - Right ? ?  ?2 ?Visit Diagnoses:  ?1. Closed right ankle fracture, initial encounter   ? ? ?Plan: [name] is a 61 year old patient who is now 2 months out right ankle open reduction internal fixation performed 11/28/2021.  She has been full weightbearing in a regular shoe since Wednesday.  Reports some stiffness and swelling.  Her wrist and shoulder are slowly improving.  She is on Plavix and aspirin. ? ?On examination she actually has pretty good range of motion of the ankle.  Ankle dorsiflexion is about 5 to 10 degrees past neutral plantarflexion about 10 to 15 degrees past neutral.  Incisions intact and mortise is stable and syndesmosis is stable to stress.  No calf tenderness negative Homans.  Plan at this time is to continue with therapy for range of motion exercises.  4-week return with decision for or against shoulder subacromial injection at that time.  Examination today demonstrates no frozen shoulder and good rotator cuff strength but she may have some degree of bursitis present. ? ?Follow-Up Instructions: Return in about 4 weeks (around 03/16/2022).  ? ?Orders:  ?Orders Placed This Encounter  ?Procedures  ? XR Ankle Complete Right  ? ?No orders of the defined types were placed in this encounter. ? ? ?Imaging: ?XR Ankle Complete Right ? ?Result Date: 02/17/2022 ?AP lateral mortise radiographs right ankle reviewed.  Hardware in good position alignment with no complicating features.  Fractures appear to be healed in the medial and lateral malleolus.  No arthritis.  ? ?PMFS History: ?Patient Active Problem List  ? Diagnosis Date Noted  ? Acute right MCA  stroke ([place]) 12/07/2021  ? GERD without esophagitis 12/07/2021  ? Right wrist fracture, with routine healing, subsequent encounter 12/07/2021  ? Bimalleolar ankle fracture, right, closed, with routine healing, subsequent encounter   ? Blepharospasm 11/08/2021  ? Sleep disorder, circadian, shift work type 08/01/2021  ? Excessive daytime sleepiness 08/01/2021  ? Morning headache 02/16/2021  ? Left eye pain 02/16/2021  ? Intractable episodic cluster headache 02/16/2021  ? Behavioral insomnia of childhood 02/16/2021  ? Retrognathia 02/16/2021  ? Chronic migraine without aura, with intractable migraine, so stated, with status migrainosus 01/12/2021  ? Muscle spasm 10/14/2020  ? Vitamin D deficiency 08/15/2020  ? Acute bilateral low back pain with bilateral sciatica 07/13/2020  ? Gastric polyp 02/11/2019  ? Sessile colonic polyp 02/11/2019  ? Chronic fatigue 02/11/2019  ? Grade I internal hemorrhoids 02/11/2019  ? Ductal hyperplasia of breast 08/21/2018  ? Cognitive changes 06/24/2018  ? Dyslipidemia 06/24/2018  ? Dense breast tissue on mammogram 04/17/2018  ? Fibrocystic breast determined by biopsy 04/09/2018  ? Family history of breast cancer in mother 04/09/2018  ? Paroxysmal sinus tachycardia ([place]) 03/27/2018  ? Osteoporosis 08/28/2017  ? Facet arthropathy, lumbosacral 08/28/2017  ? Chronic left SI joint pain 08/27/2017  ? Left lumbar radiculopathy 08/27/2017  ? Allergic rhinitis 08/09/2017  ? Disorder of lipoid metabolism 08/09/2017  ? Insomnia secondary to anxiety 08/09/2017  ? Postmenopausal osteoporosis 08/09/2017  ? History of hysterectomy for benign disease  08/09/2017  ? GAD (generalized anxiety disorder) 08/09/2017  ? Former smoker, stopped smoking in distant past 08/09/2017  ? Minor depressive disorder 08/09/2017  ? Palpitations 03/16/2015  ? Family history of colon cancer 03/02/2015  ? Hiatal hernia with GERD without esophagitis 03/02/2015  ? History of adenomatous polyp of colon 03/02/2015  ? Chronic  migraine without aura 10/10/2014  ? History of cardiovascular disorder 01/18/2003  ? ?Past Medical History:  ?Diagnosis Date  ? Anal fissure   ? Anemia   ? in early 20's  ? Anxiety   ? Arthritis   ? back  ? Breast calcifications on mammogram   ? Cancer ([place])   ? pre cancer colon,cervix  ? Colon polyp   ? Dyslipidemia 06/24/2018  ? Elevated cholesterol   ? Facet arthropathy, lumbosacral 08/28/2017  ? Family history of adverse reaction to anesthesia   ? sibling has N/V  ? GERD (gastroesophageal reflux disease)   ? Heart murmur   ? History of colon polyps   ? History of kidney stones   ? has small ones  ? IBS (irritable bowel syndrome)   ? Inappropriate sinus tachycardia   ? Migraines   ? Osteopenia   ? Osteoporosis of femur without pathological fracture 08/28/2017  ? Palpitations   ? PONV (postoperative nausea and vomiting)   ? Premature surgical menopause   ? PTSD (post-traumatic stress disorder)   ? Sleep apnea   ?  ?Family History  ?Problem Relation Age of Onset  ? Colon cancer Mother   ? Breast cancer Mother 43  ?     and liver  ? Migraines Mother   ? Hyperlipidemia Father   ? Alcohol abuse Father   ? Heart attack Father   ? Cancer Father   ?     Head and neck   ? Kidney cancer Sister 77  ? Migraines Sister   ? Migraines Sister   ? Hyperlipidemia Brother   ? Lung cancer Maternal Grandmother   ? Parkinson's disease Maternal Grandfather   ? Dementia Maternal Grandfather   ? Prostate cancer Maternal Grandfather   ? Parkinson's disease Paternal Grandmother   ? Heart attack Paternal Grandfather   ? Esophageal cancer Neg Hx   ? Stroke Neg Hx   ?  ?Past Surgical History:  ?Procedure Laterality Date  ? ANAL FISSURE REPAIR    ? APPENDECTOMY  2000  ? COLONOSCOPY W/ POLYPECTOMY  03/2015  ? COLONOSCOPY WITH ESOPHAGOGASTRODUODENOSCOPY (EGD)  04/01/2015  ? KNEE SURGERY    ? OOPHORECTOMY    ? OOPHORECTOMY    ? 1995, 1998  ? ORIF ANKLE FRACTURE Right 11/28/2021  ? Procedure: OPEN REDUCTION INTERNAL FIXATION (ORIF) ANKLE FRACTURE;   Surgeon: Meredith Pel, MD;  Location: [place];  Service: Orthopedics;  Laterality: Right;  ? PELVIC LAPAROSCOPY    ? x 4 for endometriosis   ? VAGINAL HYSTERECTOMY  1994  ? ?Social History  ? ?Occupational History  ? Occupation: Therapist, sports  ?Tobacco Use  ? Smoking status: Former  ?  Types: Cigarettes  ?  Quit date: 08/09/2006  ?  Years since quitting: 15.5  ? Smokeless tobacco: Never  ?Vaping Use  ? Vaping Use: Never used  ?Substance and Sexual Activity  ? Alcohol use: Not Currently  ?  Comment: rare  ? Drug use: No  ? Sexual activity: Never  ?  Birth control/protection: Surgical  ? ? ? ?

## 2022-02-19 ENCOUNTER — Ambulatory Visit: Payer: No Typology Code available for payment source | Admitting: Occupational Therapy

## 2022-02-19 ENCOUNTER — Ambulatory Visit: Payer: No Typology Code available for payment source | Admitting: Physical Therapy

## 2022-02-19 ENCOUNTER — Encounter: Payer: Self-pay | Admitting: Speech Pathology

## 2022-02-19 ENCOUNTER — Ambulatory Visit: Payer: No Typology Code available for payment source | Admitting: Speech Pathology

## 2022-02-19 DIAGNOSIS — R2689 Other abnormalities of gait and mobility: Secondary | ICD-10-CM

## 2022-02-19 DIAGNOSIS — R2681 Unsteadiness on feet: Secondary | ICD-10-CM

## 2022-02-19 DIAGNOSIS — M6281 Muscle weakness (generalized): Secondary | ICD-10-CM

## 2022-02-19 DIAGNOSIS — I69354 Hemiplegia and hemiparesis following cerebral infarction affecting left non-dominant side: Secondary | ICD-10-CM

## 2022-02-19 DIAGNOSIS — M25631 Stiffness of right wrist, not elsewhere classified: Secondary | ICD-10-CM

## 2022-02-19 DIAGNOSIS — R471 Dysarthria and anarthria: Secondary | ICD-10-CM

## 2022-02-19 NOTE — Therapy (Signed)
[facility] ?[place], [place], [address] ?Phone: [phone]   Fax:  [phone] ? ?Occupational Therapy Treatment ? ?Patient Details  ?Name: Alice Williams ?MRN: [id number] ?Date of Birth: 17-Apr-1961 ?Referring Provider (OT): Dr. Sarina Ill ? ? ?Encounter Date: 02/19/2022 ? ? OT End of Session - 02/19/22 1017   ? ? Visit Number 11   ? Number of Visits 25   ? Date for OT Re-Evaluation 03/25/22   ? Authorization Type Focus 2023  copay $40 per visit  No Ded , VL: [place]  Auth Reqd: after 12th visit   ? Authorization - Visit Number 10   corrected count  ? Authorization - Number of Visits 12   ? OT Start Time 1148   ? OT Stop Time 1230   ? OT Time Calculation (min) 42 min   ? Activity Tolerance Patient tolerated treatment well   ? Behavior During Therapy [facility] for tasks assessed/performed   ? ?  ?  ? ?  ? ? ?Past Medical History:  ?Diagnosis Date  ? Anal fissure   ? Anemia   ? in early 20's  ? Anxiety   ? Arthritis   ? back  ? Breast calcifications on mammogram   ? Cancer ([place])   ? pre cancer colon,cervix  ? Colon polyp   ? Dyslipidemia 06/24/2018  ? Elevated cholesterol   ? Facet arthropathy, lumbosacral 08/28/2017  ? Family history of adverse reaction to anesthesia   ? sibling has N/V  ? GERD (gastroesophageal reflux disease)   ? Heart murmur   ? History of colon polyps   ? History of kidney stones   ? has small ones  ? IBS (irritable bowel syndrome)   ? Inappropriate sinus tachycardia   ? Migraines   ? Osteopenia   ? Osteoporosis of femur without pathological fracture 08/28/2017  ? Palpitations   ? PONV (postoperative nausea and vomiting)   ? Premature surgical menopause   ? PTSD (post-traumatic stress disorder)   ? Sleep apnea   ? ? ?Past Surgical History:  ?Procedure Laterality Date  ? ANAL FISSURE REPAIR    ? APPENDECTOMY  2000  ? COLONOSCOPY W/ POLYPECTOMY  03/2015  ? COLONOSCOPY WITH ESOPHAGOGASTRODUODENOSCOPY (EGD)  04/01/2015  ? KNEE SURGERY     ? OOPHORECTOMY    ? OOPHORECTOMY    ? 1995, 1998  ? ORIF ANKLE FRACTURE Right 11/28/2021  ? Procedure: OPEN REDUCTION INTERNAL FIXATION (ORIF) ANKLE FRACTURE;  Surgeon: Meredith Pel, MD;  Location: [place];  Service: Orthopedics;  Laterality: Right;  ? PELVIC LAPAROSCOPY    ? x 4 for endometriosis   ? VAGINAL HYSTERECTOMY  1994  ? ? ?There were no vitals filed for this visit. ? ? Subjective Assessment - 02/19/22 1017   ? ? Limitations 01/05/22- received after therapy session was completed - per MD-Okay for AROM and PROM of right wrist. Okay for strengthening exercises in the brace  We will dc brace in ~2 weeks as long as she is doing okay on exam at that point, (pt is cleared for walker use and WB through RLE also)   ? Patient Stated Goals get back to where I was before (return to living alone and go back to work)   ? ?  ?  ? ?  ? ?Pt reports MD diagnosed her with R shoulder bursitis, and said she has some fluid in her wrist. He may inject the shoulder if it does not  improve ? ?A/ROM Wrist flex/ext and UD/RD x 10 reps , ? A/ROM with 3lb wt x 10 reps  each. ? ?Standing, functional reaching  to place/remove clothespins with 1-8lb resist with RUE for incr activity tolerance, balance, functional reach, and hand strength.   ? ?Kinesiotape cross piece applied to volar wrist for pain and edema wrist , pt was instructed in wear, care and precautions. She verbalized understanding. ? ?[place] 1 mhz, 0.8 w/cm 2 x 8 mins to right shoulder and upper arm. No adverse reactions. Pt reports no pain after [place] ? ?Sitting, Closed-chain shoulder flex only to 90*, chest press, diagonals to each side,with cane, min v.c for posture  ? ? ? ? ? ? OT Education - 02/19/22 1257   ? ? Education Details kinesiotape wear, care and precautions   ? Person(s) Educated Patient   ? Methods Explanation;Handout   ? Comprehension Verbalized understanding   ? ?  ?  ? ?  ? ? ? OT Short Term Goals - 02/15/22 1111   ? ?  ? OT SHORT TERM GOAL #1  ? Title Pt will  be independent with initial HEP for strength/activity tolerance.--check STGs 01/23/22   ? Time 4   ? Period Weeks   ? Status Achieved   ?  ? OT SHORT TERM GOAL #2  ? Title Pt will be independent with initial HEP for R wrist ROM (once cleared by MD).   ? Time 4   ? Period Weeks   ? Status Achieved   ?  ? OT SHORT TERM GOAL #3  ? Title Pt will perform BADLs mod I.   ? Time 4   ? Period Weeks   ? Status Achieved   ?  ? OT SHORT TERM GOAL #4  ? Title Pt will perform simple home maintenance/snack prep with supervision.   ? Time 4   ? Period Weeks   ? Status Achieved   ? ?  ?  ? ?  ? ? ? ? OT Long Term Goals - 02/15/22 1112   ? ?  ? OT LONG TERM GOAL #1  ? Title Pt will be independent with updated strengthening HEP for bilateral UEs/R wrist (once cleared by MD).--check LTGs 03/25/22   ? Time 12   ? Period Weeks   ? Status On-going   ?  ? OT LONG TERM GOAL #2  ? Title Pt will perform simple clooking and cleaning tasks mod I.   ? Time 12   ? Period Weeks   ? Status On-going   ?  ? OT LONG TERM GOAL #3  ? Title Pt will be able to complete IADL task for at least 21mn prior to rest break.   ? Time 12   ? Period Weeks   ? Status On-going   02/15/22:  10-261m  ?  ? OT LONG TERM GOAL #4  ? Title Pt will demo at least 35lbs R grip strength for lifting/carrying objects and opening containers.   ? Time 12   ? Period Weeks   ? Status On-going   01/29/22: Rt = 19.1 lbs, Lt = 44.7 lbs  ?  ? OT LONG TERM GOAL #5  ? Title Pt will demo at least 60* wrist flex/ext for ADLs/IADLs.   ? Time 12   ? Period Weeks   ? Status Achieved   flex = 65*, ext = 60*  ? ?  ?  ? ?  ? ? ? ? ? ? ? ?  Plan - 02/19/22 1258   ? ? Clinical Impression Statement Pt is progressing towards goals with improving wrist and hand function RUE. Pt is newly diagnosed with bursitis in right shoulder.   ? OT Occupational Profile and History Detailed Assessment- Review of Records and additional review of physical, cognitive, psychosocial history related to current functional  performance   ? Occupational performance deficits (Please refer to evaluation for details): ADL's;IADL's;Work;Leisure   ? Body Structure / Function / Physical Skills ADL;Decreased knowledge of use of DME;Strength;Balance;UE functional use;ROM;IADL;Endurance;Mobility   ? Rehab Potential Good   ? Clinical Decision Making Several treatment options, min-mod task modification necessary   ? Comorbidities Affecting Occupational Performance: May have comorbidities impacting occupational performance   ? Modification or Assistance to Complete Evaluation  No modification of tasks or assist necessary to complete eval   ? OT Frequency 2x / week   ? OT Duration 12 weeks   +eval, may modify based on scheduling needs/when precautions are lifted  ? OT Treatment/Interventions Self-care/ADL training;Moist Heat;Fluidtherapy;DME and/or AE instruction;Splinting;Therapeutic activities;Balance training;Contrast Bath;Ultrasound;Therapeutic exercise;Passive range of motion;Functional Mobility Training;Neuromuscular education;Cryotherapy;Electrical Stimulation;Paraffin;Manual Therapy;Patient/family education;Energy conservation   ? Plan request authorization for additional visits, address R shoulder pain prn, R hand/wrist strengthening, consider kinesiotape for right shoulder   ? Consulted and Agree with Plan of Care Patient   ? ?  ?  ? ?  ? ? ? ?Patient will benefit from skilled therapeutic intervention in order to improve the following deficits and impairments:   ?Body Structure / Function / Physical Skills: ADL, Decreased knowledge of use of DME, Strength, Balance, UE functional use, ROM, IADL, Endurance, Mobility ?  ?  ? ? ?Visit Diagnosis: ?Muscle weakness (generalized) ? ?Other abnormalities of gait and mobility ? ?Stiffness of right wrist, not elsewhere classified ? ?Hemiplegia and hemiparesis following cerebral infarction affecting left non-dominant side ([place]) ? ?Unsteadiness on feet ? ? ? ?Problem List ?Patient Active Problem List  ?  Diagnosis Date Noted  ? Acute right MCA stroke ([place]) 12/07/2021  ? GERD without esophagitis 12/07/2021  ? Right wrist fracture, with routine healing, subsequent encounter 12/07/2021  ? Bimalleolar ankl

## 2022-02-19 NOTE — Therapy (Signed)
?OUTPATIENT PHYSICAL THERAPY TREATMENT NOTE ? ? ?Patient Name: Alice Williams ?MRN: [id number] ?DOB:[birth year], 61 y.o., female ?Today's Date: 02/19/2022 ? ?PCP: Mackie Pai, PA-C ?REFERRING PROVIDER: Melvenia Beam, MD  ? ? PT End of Session - 02/19/22 1106   ? ? Visit Number 13   ? Number of Visits 25   ? Date for PT Re-Evaluation 03/14/22   ? Authorization Type Focus ([facility]) Needs auth after 12th visit   ? Authorization - Visit Number 1   ? Authorization - Number of Visits 12   ? PT Start Time 1103   ? PT Stop Time 1145   ? PT Time Calculation (min) 42 min   ? Activity Tolerance Patient tolerated treatment well   Orthostatic  ? Behavior During Therapy [facility] for tasks assessed/performed   ? ?  ?  ? ?  ? ? ? ? ? ? ? ? ? ? ? ?Past Medical History:  ?Diagnosis Date  ? Anal fissure   ? Anemia   ? in early 20's  ? Anxiety   ? Arthritis   ? back  ? Breast calcifications on mammogram   ? Cancer ([place])   ? pre cancer colon,cervix  ? Colon polyp   ? Dyslipidemia 06/24/2018  ? Elevated cholesterol   ? Facet arthropathy, lumbosacral 08/28/2017  ? Family history of adverse reaction to anesthesia   ? sibling has N/V  ? GERD (gastroesophageal reflux disease)   ? Heart murmur   ? History of colon polyps   ? History of kidney stones   ? has small ones  ? IBS (irritable bowel syndrome)   ? Inappropriate sinus tachycardia   ? Migraines   ? Osteopenia   ? Osteoporosis of femur without pathological fracture 08/28/2017  ? Palpitations   ? PONV (postoperative nausea and vomiting)   ? Premature surgical menopause   ? PTSD (post-traumatic stress disorder)   ? Sleep apnea   ? ?Past Surgical History:  ?Procedure Laterality Date  ? ANAL FISSURE REPAIR    ? APPENDECTOMY  2000  ? COLONOSCOPY W/ POLYPECTOMY  03/2015  ? COLONOSCOPY WITH ESOPHAGOGASTRODUODENOSCOPY (EGD)  04/01/2015  ? KNEE SURGERY    ? OOPHORECTOMY    ? OOPHORECTOMY    ? 1995, 1998  ? ORIF ANKLE FRACTURE Right 11/28/2021  ? Procedure: OPEN REDUCTION INTERNAL FIXATION (ORIF) ANKLE  FRACTURE;  Surgeon: Meredith Pel, MD;  Location: [facility];  Service: Orthopedics;  Laterality: Right;  ? PELVIC LAPAROSCOPY    ? x 4 for endometriosis   ? VAGINAL HYSTERECTOMY  1994  ? ?Patient Active Problem List  ? Diagnosis Date Noted  ? Acute right MCA stroke ([place]) 12/07/2021  ? GERD without esophagitis 12/07/2021  ? Right wrist fracture, with routine healing, subsequent encounter 12/07/2021  ? Bimalleolar ankle fracture, right, closed, with routine healing, subsequent encounter   ? Blepharospasm 11/08/2021  ? Sleep disorder, circadian, shift work type 08/01/2021  ? Excessive daytime sleepiness 08/01/2021  ? Morning headache 02/16/2021  ? Left eye pain 02/16/2021  ? Intractable episodic cluster headache 02/16/2021  ? Behavioral insomnia of childhood 02/16/2021  ? Retrognathia 02/16/2021  ? Chronic migraine without aura, with intractable migraine, so stated, with status migrainosus 01/12/2021  ? Muscle spasm 10/14/2020  ? Vitamin D deficiency 08/15/2020  ? Acute bilateral low back pain with bilateral sciatica 07/13/2020  ? Gastric polyp 02/11/2019  ? Sessile colonic polyp 02/11/2019  ? Chronic fatigue 02/11/2019  ? Grade I internal hemorrhoids 02/11/2019  ? Ductal  hyperplasia of breast 08/21/2018  ? Cognitive changes 06/24/2018  ? Dyslipidemia 06/24/2018  ? Dense breast tissue on mammogram 04/17/2018  ? Fibrocystic breast determined by biopsy 04/09/2018  ? Family history of breast cancer in mother 04/09/2018  ? Paroxysmal sinus tachycardia ([place]) 03/27/2018  ? Osteoporosis 08/28/2017  ? Facet arthropathy, lumbosacral 08/28/2017  ? Chronic left SI joint pain 08/27/2017  ? Left lumbar radiculopathy 08/27/2017  ? Allergic rhinitis 08/09/2017  ? Disorder of lipoid metabolism 08/09/2017  ? Insomnia secondary to anxiety 08/09/2017  ? Postmenopausal osteoporosis 08/09/2017  ? History of hysterectomy for benign disease 08/09/2017  ? GAD (generalized anxiety disorder) 08/09/2017  ? Former smoker, stopped smoking in  distant past 08/09/2017  ? Minor depressive disorder 08/09/2017  ? Palpitations 03/16/2015  ? Family history of colon cancer 03/02/2015  ? Hiatal hernia with GERD without esophagitis 03/02/2015  ? History of adenomatous polyp of colon 03/02/2015  ? Chronic migraine without aura 10/10/2014  ? History of cardiovascular disorder 01/18/2003  ? ? ?REFERRING DIAG: R53.1 (ICD-10-CM) - Left-sided weakness R41.4 (ICD-10-CM) - Left-sided neglect I63.411 (ICD-10-CM) - Cerebrovascular accident (CVA) due to embolism of right middle cerebral artery (HCC) I63.9,I77.70 (ICD-10-CM) - Cerebral ischemic stroke due to arterial dissection (HCC)  ? ?THERAPY DIAG:  ?Muscle weakness (generalized) ? ?Other abnormalities of gait and mobility ? ?Unsteadiness on feet ? ?PERTINENT HISTORY: SVT, migraine headaches, PTSD, anxiety, irritable bowel syndrome, GERD, osteoporosis, sleep apnea, left sciatica.  ? ?PRECAUTIONS: Fall, L hemi, Full weightbearing R wrist, WBAT RLE  ? ?SUBJECTIVE: Pt ambulated into clinic without using AD, very excited to be able to weight shift without UE support. No new changes. R shoulder more painful today.  ? ?PAIN:  ?Are you having pain? Yes ?Pain Location: R shoulder (bursitis) and R wrist  ?Pain severity: 3/10  ? ?OBJECTIVE ? ?TODAY'S TREATMENT:  ?Pt wore compression sock on BLEs throughout session.  ? ?Ther Ex ?SciFit level 3 for 6 minutes using BUE/BLEs for dynamic cardiovascular warmup, BLE strength and neural priming for gait training. Pt maintained steps/min > 85 throughout.  ? ?Updated HEP (see below) for improved ankle ROM and hamstring mobility. Pt unable to straighten RLE in long sitting due to tightness of hamstring. Pt able to perform exercises well and demonstrates decreased ROM of inversion > eversion of R ankle.  ? ?NMR  ?In // bars, standing on rockerboard x6 minutes w/BUE support for improved ankle ROM, stability and ankle strategy. Pt practiced rocking forward and retro slowly, min cues to avoid  leaning w/trunk and isolate movement at ankles.  ?  ? ?PATIENT EDUCATION: ?Education details: Updates to HEP.  ?Person educated: Patient ?Education method: Explanation and Demonstrationand handout provided  ?Education comprehension: verbalized understanding and needs further education ? ? ?HOME EXERCISE PROGRAM: updated on 4/24 ?Access Code: [email or web address] ?URL: [email or web address] ?Date: 02/19/2022 ?Prepared by: Mickie Bail Angalina Ante ? ?Exercises ?- Proper Sit to Stand Technique  - 1 x daily - 7 x weekly - 3 sets - 10 reps ?- Seated Hamstring Stretch  - 1 x daily - 7 x weekly - 3 sets - 10 reps - 30 second hold ?- Seated Ankle Alphabet  - 1 x daily - 7 x weekly - 3 sets - 10 reps ?- Long Sitting Ankle Plantar Flexion with Resistance  - 1 x daily - 7 x weekly - 3 sets - 10 reps ?- Long Sitting Ankle Inversion with Resistance  - 1 x daily - 7 x weekly - 3 sets - 10 reps ?-  Long Sitting Ankle Eversion with Resistance  - 1 x daily - 7 x weekly - 3 sets - 10 reps ?- Ankle Dorsiflexion with Resistance  - 1 x daily - 7 x weekly - 3 sets - 10 reps ? ? ? ?SHORT TERM GOALS:   Target date: 03/19/2022 ? ?Pt will ascend/descend 12 6" steps using LRAD and no rail mod I w/step-to pattern to imitate home environment and improve independence  ?Baseline:  ?Goal status: INITIAL ? ?2.  Pt will ambulate 250' w/SPC and S* for improved functional mobility  ?Baseline: using LBQC ?Goal status: INITIAL ? ?3.  Pt will improve 5 x STS to less than or equal to 16 seconds without BUE support to demonstrate improved functional strength and transfer efficiency.  ? ?Baseline: 16.75s w/BUE ?Goal status: INITIAL ? ?4.  Pt will improve normal TUG to less than or equal to 15 seconds w/LRAD for improved functional mobility and decreased fall risk. ? ?Baseline: 21.54s w/LBQC ?Goal status: INITIAL ? ? ?LONG TERM GOALS:  Target date: 04/09/2022 ? ? ?Pt will improve Berg score to 50/56 for decreased fall risk ? ?Baseline: 41/56 on 4/17 ?Goal status:  INITIAL ? ?2.  Pt will improve gait velocity to at least 2.1 ft/s with LRAD for improved gait efficiency and performance at limited community ambulator level   ?Baseline: 1.4 ft/s w/LBQC ?Goal status: INITIAL ? ?3

## 2022-02-19 NOTE — Patient Instructions (Signed)
Kinesiotape- ? ?You can wear the kinesiotape for 3-5 days as long as no problems ?If you have increased pain or swelling, wet the tape and remove slowly. ?

## 2022-02-19 NOTE — Therapy (Signed)
[facility] ?[place] ?[place], [place], [address] ?Phone: [phone]   Fax:  [phone] ? ?Speech Language Pathology Treatment & Discharge Summary ? ?Patient Details  ?Name: Alice Williams ?MRN: [id number] ?Date of Birth: 03-27-1961 ?Referring Provider (SLP): Dr. Rosalin Hawking ? ? ?Encounter Date: 02/19/2022 ? ? End of Session - 02/19/22 1046   ? ? Visit Number 11   ? Number of Visits 25   ? Date for SLP Re-Evaluation 03/23/22   ? Authorization - Visit Number 10   ? Authorization - Number of Visits 12   ? SLP Start Time 1015   ? SLP Stop Time  1045   ? SLP Time Calculation (min) 30 min   ? Activity Tolerance Patient tolerated treatment well   ? ?  ?  ? ?  ? ? ?Past Medical History:  ?Diagnosis Date  ? Anal fissure   ? Anemia   ? in early 20's  ? Anxiety   ? Arthritis   ? back  ? Breast calcifications on mammogram   ? Cancer ([place])   ? pre cancer colon,cervix  ? Colon polyp   ? Dyslipidemia 06/24/2018  ? Elevated cholesterol   ? Facet arthropathy, lumbosacral 08/28/2017  ? Family history of adverse reaction to anesthesia   ? sibling has N/V  ? GERD (gastroesophageal reflux disease)   ? Heart murmur   ? History of colon polyps   ? History of kidney stones   ? has small ones  ? IBS (irritable bowel syndrome)   ? Inappropriate sinus tachycardia   ? Migraines   ? Osteopenia   ? Osteoporosis of femur without pathological fracture 08/28/2017  ? Palpitations   ? PONV (postoperative nausea and vomiting)   ? Premature surgical menopause   ? PTSD (post-traumatic stress disorder)   ? Sleep apnea   ? ? ?Past Surgical History:  ?Procedure Laterality Date  ? ANAL FISSURE REPAIR    ? APPENDECTOMY  2000  ? COLONOSCOPY W/ POLYPECTOMY  03/2015  ? COLONOSCOPY WITH ESOPHAGOGASTRODUODENOSCOPY (EGD)  04/01/2015  ? KNEE SURGERY    ? OOPHORECTOMY    ? OOPHORECTOMY    ? 1995, 1998  ? ORIF ANKLE FRACTURE Right 11/28/2021  ? Procedure: OPEN REDUCTION INTERNAL FIXATION (ORIF) ANKLE  FRACTURE;  Surgeon: Meredith Pel, MD;  Location: [facility];  Service: Orthopedics;  Laterality: Right;  ? PELVIC LAPAROSCOPY    ? x 4 for endometriosis   ? VAGINAL HYSTERECTOMY  1994  ? ? ?There were no vitals filed for this visit. ? ? Subjective Assessment - 02/19/22 1028   ? ? Subjective "[place] when [place] were screaming"   ? Currently in Pain? Yes   ? Pain Score 2    ? Pain Location Foot   ? Pain Orientation Right   ? Pain Descriptors / Indicators Discomfort   ? Pain Type Chronic pain   ? Pain Onset More than a month ago   ? Pain Frequency Constant   ? ?  ?  ? ?  ? ? ? ? ? ? ? ? ADULT SLP TREATMENT - 02/19/22 1023   ? ?  ? General Information  ? Behavior/Cognition Alert;Cooperative;Pleasant mood   ?  ? Treatment Provided  ? Treatment provided Cognitive-Linquistic   ?  ? Cognitive-Linquistic Treatment  ? Treatment focused on Dysarthria;Patient/family/caregiver education   ? Skilled Treatment Pt enters with WNL volume. She reports success talking  while on the treadmill, talking in noisy places and no more difficulty communicating over the phone. She was heard withoug difficulty. She endorses hearing if she gets hoarse and is able to correct this with mod I. Educated in energy conservation. Verdie scored WNL on VRQL with no problems, or score of 1. Increased from 20 to 10. she maintained 70-72dB over 20 minute conversation with mod I.   ?  ? Assessment / Recommendations / Plan  ? Plan Discharge SLP treatment due to (comment)   ?  ? Progression Toward Goals  ? Progression toward goals Goals met, education completed, patient discharged from SLP   ? ?  ?  ? ?  ? ? ? SLP Education - 02/19/22 1046   ? ? Education Details energy conservation   ? Person(s) Educated Patient   ? Methods Explanation;Verbal cues   ? Comprehension Verbalized understanding   ? ?  ?  ? ?  ? ?SPEECH THERAPY DISCHARGE SUMMARY ? ?Visits from Start of Care: 11 ? ?Current functional level related to  goals / functional outcomes: ?See goals below ?  ?Remaining deficits: ?Possible hypokinetic dysarthria with fatigue ?  ?Education / Equipment: ?HEP for dysarthria, compensations for dysarthria, vocal hygiene  ? ?Patient agrees to discharge. Patient goals were met. Patient is being discharged due to meeting the stated rehab goals.. ? ? ? ? SLP Short Term Goals - 02/19/22 1030   ? ?  ? SLP SHORT TERM GOAL #1  ? Title Pt will complete loud AH with average of 85dB over 2 sessions with rare min A   ? Baseline 01-01-22, 01-18-22   ? Status Achieved   ?  ? SLP SHORT TERM GOAL #2  ? Title Pt will complete HEP for voice/dysarthria (PHoRTE) with rare min A over 2 sessions   ? Baseline 01-18-22, 02-01-22   ? Status Achieved   ?  ? SLP SHORT TERM GOAL #3  ? Title Pt will average 72dB 18/20 sentences in structured speech tasks   ? Baseline 01-01-22, 01-18-22   ? Status Achieved   ?  ? SLP SHORT TERM GOAL #4  ? Title Pt will maintain average of 70dB and clear phonation over 8 minute conversation with occasional min A   ? Baseline 02-01-22; 02-08-22   ? Status Achieved   ? ?  ?  ? ?  ? ? ? SLP Long Term Goals - 02/19/22 1031   ? ?  ? SLP LONG TERM GOAL #1  ? Title Pt will complete HEP for dysarthria with mod I   ? Baseline 02-12-22; 02/19/22   ? Time 6   ? Period Weeks   ? Status Achieved   ?  ? SLP LONG TERM GOAL #2  ? Title Pt will self correct low, hoarse voice with rare min A as needed over 2 sessions   ? Baseline 02-01-22, 02-12-22   ? Time 6   ? Period Weeks   ? Status Achieved   ?  ? SLP LONG TERM GOAL #3  ? Title Pt will average 70dB over 15 minute conversation with rare min A   ? Baseline 02-12-22; 02/19/22   ? Time 6   ? Period Weeks   ? Status Achieved   ?  ? SLP LONG TERM GOAL #4  ? Title Pt will be 100% intelligible in noisy environment with rare min A over 15 minute conversation   ? Baseline 02-12-22; 02/19/22   ? Time 6   ? Period  Weeks   ? Status Achieved   ?  ? SLP LONG TERM GOAL #5  ? Title Pt will improve score on Voice Related  Quality of Life Measure (VRQOL) raw score by 2 points   ? Baseline raw score - 20; calculated score - 75; Pst - raw score 10, calcuated   ? Time 6   ? Period Weeks   ? Status Achieved   ? ?  ?  ? ?  ? ? ? Plan - 02/19/22 1047   ? ? Clinical Impression Statement Ramaya presents today with speech/voice WNL,. Volume WNL across settings and in noisy environemnts. She continues to report no difficulty over the phone.She will continue HEP as needed. She continues to demonstrate good vocal hygiene. Pt is pleased with her speech and voice, d/c ST. Pt in agreement   ? Speech Therapy Frequency 2x / week   ? Duration 12 weeks   ? Treatment/Interventions SLP instruction and feedback;Compensatory strategies;Functional tasks;Patient/family education;Multimodal communcation approach;Internal/external aids;Environmental controls   ? Potential to Achieve Goals Good   ? ?  ?  ? ?  ? ? ?Patient will benefit from skilled therapeutic intervention in order to improve the following deficits and impairments:   ?Dysarthria and anarthria ? ? ? ?Problem List ?Patient Active Problem List  ? Diagnosis Date Noted  ? Acute right MCA stroke ([place]) 12/07/2021  ? GERD without esophagitis 12/07/2021  ? Right wrist fracture, with routine healing, subsequent encounter 12/07/2021  ? Bimalleolar ankle fracture, right, closed, with routine healing, subsequent encounter   ? Blepharospasm 11/08/2021  ? Sleep disorder, circadian, shift work type 08/01/2021  ? Excessive daytime sleepiness 08/01/2021  ? Morning headache 02/16/2021  ? Left eye pain 02/16/2021  ? Intractable episodic cluster headache 02/16/2021  ? Behavioral insomnia of childhood 02/16/2021  ? Retrognathia 02/16/2021  ? Chronic migraine without aura, with intractable migraine, so stated, with status migrainosus 01/12/2021  ? Muscle spasm 10/14/2020  ? Vitamin D deficiency 08/15/2020  ? Acute bilateral low back pain with bilateral sciatica 07/13/2020  ? Gastric polyp 02/11/2019  ? Sessile colonic  polyp 02/11/2019  ? Chronic fatigue 02/11/2019  ? Grade I internal hemorrhoids 02/11/2019  ? Ductal hyperplasia of breast 08/21/2018  ? Cognitive changes 06/24/2018  ? Dyslipidemia 06/24/2018  ? Dense breast

## 2022-02-20 ENCOUNTER — Telehealth: Payer: Self-pay | Admitting: Orthopedic Surgery

## 2022-02-20 ENCOUNTER — Ambulatory Visit
Admission: RE | Admit: 2022-02-20 | Discharge: 2022-02-20 | Disposition: A | Discharge status: Home / Self Care | Payer: No Typology Code available for payment source | Source: Ambulatory Visit | Attending: Neurology | Admitting: Neurology

## 2022-02-20 DIAGNOSIS — I7771 Dissection of carotid artery: Secondary | ICD-10-CM

## 2022-02-20 DIAGNOSIS — I63511 Cerebral infarction due to unspecified occlusion or stenosis of right middle cerebral artery: Secondary | ICD-10-CM

## 2022-02-20 MED ORDER — IOPAMIDOL (ISOVUE-370) INJECTION 76%
75.0000 mL | Freq: Once | INTRAVENOUS | Status: AC | PRN
  Administered 2022-02-20: 75 mL via INTRAVENOUS

## 2022-02-20 NOTE — Telephone Encounter (Signed)
[employer] forms received. To [employer]. 

## 2022-02-22 ENCOUNTER — Ambulatory Visit: Payer: No Typology Code available for payment source | Admitting: Occupational Therapy

## 2022-02-22 ENCOUNTER — Encounter: Payer: Self-pay | Admitting: Occupational Therapy

## 2022-02-22 ENCOUNTER — Other Ambulatory Visit (HOSPITAL_COMMUNITY): Payer: Self-pay

## 2022-02-22 ENCOUNTER — Ambulatory Visit: Payer: No Typology Code available for payment source | Admitting: Physical Therapy

## 2022-02-22 DIAGNOSIS — M25631 Stiffness of right wrist, not elsewhere classified: Secondary | ICD-10-CM

## 2022-02-22 DIAGNOSIS — M6281 Muscle weakness (generalized): Secondary | ICD-10-CM

## 2022-02-22 DIAGNOSIS — R2681 Unsteadiness on feet: Secondary | ICD-10-CM

## 2022-02-22 DIAGNOSIS — I69354 Hemiplegia and hemiparesis following cerebral infarction affecting left non-dominant side: Secondary | ICD-10-CM

## 2022-02-22 DIAGNOSIS — R2689 Other abnormalities of gait and mobility: Secondary | ICD-10-CM

## 2022-02-22 NOTE — Patient Instructions (Signed)
SHOULDER: Flexion - Sitting ? ? ? ?Hold paper towel roll with both hands (palms facing - do not let pinky side of hand come up). Raise arms up to tolerance. Keep elbows straight.  ?10___ reps per set, _2__ sets per day ? ?Abduction (Eccentric) - Active (Cane) ? ? ? ?SEATED: Lift cane out to Rt side with affected arm, RT PALM UP. Avoid hiking shoulder. Keep palm relaxed. Slowly lower affected arm. ?_10__ reps per set, _2__ sets per day ? ? ? ?Scapular Retraction (Prone) ? ? ? ?Lie with arms at sides. Pinch shoulder blades together and down towards buttocks (try and imagine placing shoulder blades in back pocket)  ?Hold 5 sec. Repeat __10__ times per set.  Do __2__ sessions per day. ? ?On Elbows (Prone) ? ? ? ?Lift chest and rise up on elbows as high as possible, keeping stomach and hips on floor. Hold __5__ seconds. ?Repeat __5-10__ times per set.  Do __2__ sessions per day. ? ? ? ?

## 2022-02-22 NOTE — Therapy (Signed)
?OUTPATIENT PHYSICAL THERAPY TREATMENT NOTE ? ? ?Patient Name: Alice Williams ?MRN: [id number] ?DOB:1961/01/16, 61 y.o., female ?Today's Date: 02/22/2022 ? ?PCP: Mackie Pai, PA-C ?REFERRING PROVIDER: Melvenia Beam, MD  ? ? PT End of Session - 02/22/22 1104   ? ? Visit Number 14   ? Number of Visits 25   ? Date for PT Re-Evaluation 03/14/22   ? Authorization Type [facility] Needs auth after 12th visit   ? Authorization Time Period 12 visits from 02/22/22-04/06/22   ? Authorization - Visit Number 1   ? Authorization - Number of Visits 12   ? PT Start Time 1102   ? PT Stop Time 1146   ? PT Time Calculation (min) 44 min   ? Activity Tolerance Patient tolerated treatment well   Orthostatic  ? Behavior During Therapy [facility] for tasks assessed/performed   ? ?  ?  ? ?  ? ? ? ? ? ? ? ? ? ? ? ? ?Past Medical History:  ?Diagnosis Date  ? Anal fissure   ? Anemia   ? in early 20's  ? Anxiety   ? Arthritis   ? back  ? Breast calcifications on mammogram   ? Cancer ([place])   ? pre cancer colon,cervix  ? Colon polyp   ? Dyslipidemia 06/24/2018  ? Elevated cholesterol   ? Facet arthropathy, lumbosacral 08/28/2017  ? Family history of adverse reaction to anesthesia   ? sibling has N/V  ? GERD (gastroesophageal reflux disease)   ? Heart murmur   ? History of colon polyps   ? History of kidney stones   ? has small ones  ? IBS (irritable bowel syndrome)   ? Inappropriate sinus tachycardia   ? Migraines   ? Osteopenia   ? Osteoporosis of femur without pathological fracture 08/28/2017  ? Palpitations   ? PONV (postoperative nausea and vomiting)   ? Premature surgical menopause   ? PTSD (post-traumatic stress disorder)   ? Sleep apnea   ? ?Past Surgical History:  ?Procedure Laterality Date  ? ANAL FISSURE REPAIR    ? APPENDECTOMY  2000  ? COLONOSCOPY W/ POLYPECTOMY  03/2015  ? COLONOSCOPY WITH ESOPHAGOGASTRODUODENOSCOPY (EGD)  04/01/2015  ? KNEE SURGERY    ? OOPHORECTOMY    ? OOPHORECTOMY    ? 1995, 1998  ? ORIF ANKLE FRACTURE Right 11/28/2021   ? Procedure: OPEN REDUCTION INTERNAL FIXATION (ORIF) ANKLE FRACTURE;  Surgeon: Meredith Pel, MD;  Location: [facility];  Service: Orthopedics;  Laterality: Right;  ? PELVIC LAPAROSCOPY    ? x 4 for endometriosis   ? VAGINAL HYSTERECTOMY  1994  ? ?Patient Active Problem List  ? Diagnosis Date Noted  ? Acute right MCA stroke ([place]) 12/07/2021  ? GERD without esophagitis 12/07/2021  ? Right wrist fracture, with routine healing, subsequent encounter 12/07/2021  ? Bimalleolar ankle fracture, right, closed, with routine healing, subsequent encounter   ? Blepharospasm 11/08/2021  ? Sleep disorder, circadian, shift work type 08/01/2021  ? Excessive daytime sleepiness 08/01/2021  ? Morning headache 02/16/2021  ? Left eye pain 02/16/2021  ? Intractable episodic cluster headache 02/16/2021  ? Behavioral insomnia of childhood 02/16/2021  ? Retrognathia 02/16/2021  ? Chronic migraine without aura, with intractable migraine, so stated, with status migrainosus 01/12/2021  ? Muscle spasm 10/14/2020  ? Vitamin D deficiency 08/15/2020  ? Acute bilateral low back pain with bilateral sciatica 07/13/2020  ? Gastric polyp 02/11/2019  ? Sessile colonic polyp 02/11/2019  ? Chronic fatigue  02/11/2019  ? Grade I internal hemorrhoids 02/11/2019  ? Ductal hyperplasia of breast 08/21/2018  ? Cognitive changes 06/24/2018  ? Dyslipidemia 06/24/2018  ? Dense breast tissue on mammogram 04/17/2018  ? Fibrocystic breast determined by biopsy 04/09/2018  ? Family history of breast cancer in mother 04/09/2018  ? Paroxysmal sinus tachycardia ([place]) 03/27/2018  ? Osteoporosis 08/28/2017  ? Facet arthropathy, lumbosacral 08/28/2017  ? Chronic left SI joint pain 08/27/2017  ? Left lumbar radiculopathy 08/27/2017  ? Allergic rhinitis 08/09/2017  ? Disorder of lipoid metabolism 08/09/2017  ? Insomnia secondary to anxiety 08/09/2017  ? Postmenopausal osteoporosis 08/09/2017  ? History of hysterectomy for benign disease 08/09/2017  ? GAD (generalized anxiety  disorder) 08/09/2017  ? Former smoker, stopped smoking in distant past 08/09/2017  ? Minor depressive disorder 08/09/2017  ? Palpitations 03/16/2015  ? Family history of colon cancer 03/02/2015  ? Hiatal hernia with GERD without esophagitis 03/02/2015  ? History of adenomatous polyp of colon 03/02/2015  ? Chronic migraine without aura 10/10/2014  ? History of cardiovascular disorder 01/18/2003  ? ? ?REFERRING DIAG: R53.1 (ICD-10-CM) - Left-sided weakness R41.4 (ICD-10-CM) - Left-sided neglect I63.411 (ICD-10-CM) - Cerebrovascular accident (CVA) due to embolism of right middle cerebral artery (HCC) I63.9,I77.70 (ICD-10-CM) - Cerebral ischemic stroke due to arterial dissection (HCC)  ? ?THERAPY DIAG:  ?Unsteadiness on feet ? ?Other abnormalities of gait and mobility ? ?Muscle weakness (generalized) ? ?PERTINENT HISTORY: SVT, migraine headaches, PTSD, anxiety, irritable bowel syndrome, GERD, osteoporosis, sleep apnea, left sciatica.  ? ?PRECAUTIONS: Fall, L hemi, Full weightbearing R wrist, WBAT RLE  ? ?SUBJECTIVE: Pt reports working on her exercises, felt her R ankle pop during ankle ABCs which caught her off guard but was not painful. No other changes to reports.  ? ?PAIN:  ?Are you having pain? Yes ?Pain Location: R shoulder (bursitis) and R wrist  ?Pain severity: 3/10  ? ?OBJECTIVE ? ?DIAGNOSTIC FINDINGS: MRI and CTA performed on 02/20/22: ICA compared to 12/14/2021 and 12/07/2021 CTA studies. Although they were apparently not compared at the time, there was already some improvement between the two February studies. There is no longer any stenosis.  ? ?Right MCA occlusion seen on 12/07/2021 is no longer present. ? ?TODAY'S TREATMENT:  ?Pt wore compression sock on BLEs throughout session.  ? ?Ther Ex ?Updated HEP (see below) to include seated towel scrunches for improved ankle ROM and intrinsic foot musculature strength of R foot. Pt unable to flex foot well and reports feeling a "ball of something" in her foot.   ? ?NMR  ?In // bars for improved lateral weight shifting, single leg stability and eccentric control of BLEs: ?-Alt. Toe taps to 6" step without BUE support, x20 per side. Min cues to slow down when tapping w/LLE to improve stance control of RLE.  ?-Lateral step downs from 6" step, x20 per side, w/BUE support. Noted significant hesitancy when lowering with LLE due to weakness of RLE. Heavy reliance on BUE support when moving LLE, min cues for upright posture and reduced reliance on BUEs. ?-Progressed to alt. Forward step downs from 6" step, min tactile cues provided to pelvis to reduce pelvic rotation as a compensation to perform movement. X15 per side  ?-Alt. Step ups w/15s isometric hold on blue side of Bosu w/BUE support for improved ankle ROM, ankle strategy and single leg support. X2 reps per side.  ?  ? ?PATIENT EDUCATION: ?Education details: Continuing HEP and importance of stretching at home  ?Person educated: Patient ?Education method:  Explanation and Demonstration and handout  ?Education comprehension: verbalized understanding and needs further education ? ? ?HOME EXERCISE PROGRAM: updated on 4/24 ?Access Code: [email or web address] ?URL: [email or web address] ?Date: 02/22/2022 ?Prepared by: Mickie Bail Rayah Fines ? ?Exercises ?- Proper Sit to Stand Technique  - 1 x daily - 7 x weekly - 3 sets - 10 reps ?- Seated Hamstring Stretch  - 1 x daily - 7 x weekly - 3 sets - 10 reps - 30 second hold ?- Seated Ankle Alphabet  - 1 x daily - 7 x weekly - 3 sets - 10 reps ?- Long Sitting Ankle Plantar Flexion with Resistance  - 1 x daily - 7 x weekly - 3 sets - 10 reps ?- Long Sitting Ankle Inversion with Resistance  - 1 x daily - 7 x weekly - 3 sets - 10 reps ?- Long Sitting Ankle Eversion with Resistance  - 1 x daily - 7 x weekly - 3 sets - 10 reps ?- Ankle Dorsiflexion with Resistance  - 1 x daily - 7 x weekly - 3 sets - 10 reps ?- Seated Toe Towel Scrunches  - 1 x daily - 7 x weekly - 3 sets - 10 reps ? ? ? ?SHORT TERM  GOALS:   Target date: 03/22/2022 ? ?Pt will ascend/descend 12 6" steps using LRAD and no rail mod I w/step-to pattern to imitate home environment and improve independence  ?Baseline:  ?Goal status: INITIAL

## 2022-02-22 NOTE — Therapy (Signed)
[facility], [place], [address] ?Phone: [phone]   Fax:  [phone] ? ?Occupational Therapy Treatment ? ?Patient Details  ?Name: Alice Williams ?MRN: [id number] ?Date of Birth: February 18, 1961 ?Referring Provider (OT): Dr. Sarina Ill ? ? ?Encounter Date: 02/22/2022 ? ? OT End of Session - 02/22/22 1021   ? ? Visit Number 12   ? Number of Visits 25   ? Date for OT Re-Evaluation 03/25/22   ? Authorization Type Focus 2023  copay $40 per visit  No Ded , VL: [place]  Auth Reqd: after 12th visit   ? Authorization - Visit Number 11   corrected count  ? Authorization - Number of Visits 12   ? OT Start Time 1015   ? OT Stop Time 1100   ? OT Time Calculation (min) 45 min   ? Activity Tolerance Patient tolerated treatment well   ? Behavior During Therapy [facility] for tasks assessed/performed   ? ?  ?  ? ?  ? ? ?Past Medical History:  ?Diagnosis Date  ? Anal fissure   ? Anemia   ? in early 20's  ? Anxiety   ? Arthritis   ? back  ? Breast calcifications on mammogram   ? Cancer ([place])   ? pre cancer colon,cervix  ? Colon polyp   ? Dyslipidemia 06/24/2018  ? Elevated cholesterol   ? Facet arthropathy, lumbosacral 08/28/2017  ? Family history of adverse reaction to anesthesia   ? sibling has N/V  ? GERD (gastroesophageal reflux disease)   ? Heart murmur   ? History of colon polyps   ? History of kidney stones   ? has small ones  ? IBS (irritable bowel syndrome)   ? Inappropriate sinus tachycardia   ? Migraines   ? Osteopenia   ? Osteoporosis of femur without pathological fracture 08/28/2017  ? Palpitations   ? PONV (postoperative nausea and vomiting)   ? Premature surgical menopause   ? PTSD (post-traumatic stress disorder)   ? Sleep apnea   ? ? ?Past Surgical History:  ?Procedure Laterality Date  ? ANAL FISSURE REPAIR    ? APPENDECTOMY  2000  ? COLONOSCOPY W/ POLYPECTOMY  03/2015  ? COLONOSCOPY WITH ESOPHAGOGASTRODUODENOSCOPY (EGD)  04/01/2015  ? KNEE SURGERY     ? OOPHORECTOMY    ? OOPHORECTOMY    ? 1995, 1998  ? ORIF ANKLE FRACTURE Right 11/28/2021  ? Procedure: OPEN REDUCTION INTERNAL FIXATION (ORIF) ANKLE FRACTURE;  Surgeon: Meredith Pel, MD;  Location: [place];  Service: Orthopedics;  Laterality: Right;  ? PELVIC LAPAROSCOPY    ? x 4 for endometriosis   ? VAGINAL HYSTERECTOMY  1994  ? ? ?There were no vitals filed for this visit. ? ? Subjective Assessment - 02/22/22 1021   ? ? Subjective  Dr. Marlou Sa said I have bursitis in my shoulder and fluid in the Rt wrist. If shoulder is not better in a month, he will do a cortisone injection. The kinesiotape helped my wrist a little.   ? Limitations 01/05/22- received after therapy session was completed - per MD-Okay for AROM and PROM of right wrist. Okay for strengthening exercises in the brace  We will dc brace in ~2 weeks as long as she is doing okay on exam at that point, (pt is cleared for walker use and WB through RLE also)   ? Patient Stated Goals get back to where I was before (return to living alone and go  back to work)   ? Currently in Pain? Yes   ? Pain Score 2    ? Pain Location --   Rt shoulder and wrist  ? Pain Orientation Right   ? Pain Descriptors / Indicators Discomfort   ? Pain Type Chronic pain   ? Pain Onset More than a month ago   ? Pain Frequency Constant   ? Aggravating Factors  sleeping on Rt side, first thing in the morning, reaching higher   ? Pain Relieving Factors stretches   ? ?  ?  ? ?  ? ? ?Rt Grip strength = 25.3 lbs ?Rt Wrist flex = 70*, ext = 55*  ?RUE shoulder flex = 120* w/ pain 4/10 ? ?Prayer stretch x 3 reps holding 10 sec. Pt encouraged to continue this as HEP as she has lost 5 degrees of wrist extension since last assessed; however has increased wrist flex by 5 degrees.  ? ?Pt issued additional shoulder HEP and scapula stabilization HEP - see pt instructions for details. Pt return each x 10 reps. Pt instructed to stop with any increase in pain Rt shoulder.  ? ?Pt reports pain in shoulder  when sleeping on Rt side - pt instructed to sleep on back or Lt side, issued handout on bed positioning and demo w/ use of towels/pillows to support RUE.  ? ?Kinesiotape still on Rt wrist and looks well. Pt reports this helped a little w/ mild pain at wrist. Pt instructed to take off tomorrow as this will be 5th day and will possibly re-tape next week prn ? ? ? ? ? ? ? ? ? ? ? ? ? ? ? ? ? OT Education - 02/22/22 1059   ? ? Education Details additional HEP for shoulder and scapula   ? Person(s) Educated Patient   ? Methods Explanation;Handout;Demonstration   ? Comprehension Verbalized understanding;Returned demonstration   ? ?  ?  ? ?  ? ? ? OT Short Term Goals - 02/15/22 1111   ? ?  ? OT SHORT TERM GOAL #1  ? Title Pt will be independent with initial HEP for strength/activity tolerance.--check STGs 01/23/22   ? Time 4   ? Period Weeks   ? Status Achieved   ?  ? OT SHORT TERM GOAL #2  ? Title Pt will be independent with initial HEP for R wrist ROM (once cleared by MD).   ? Time 4   ? Period Weeks   ? Status Achieved   ?  ? OT SHORT TERM GOAL #3  ? Title Pt will perform BADLs mod I.   ? Time 4   ? Period Weeks   ? Status Achieved   ?  ? OT SHORT TERM GOAL #4  ? Title Pt will perform simple home maintenance/snack prep with supervision.   ? Time 4   ? Period Weeks   ? Status Achieved   ? ?  ?  ? ?  ? ? ? ? OT Long Term Goals - 02/22/22 1106   ? ?  ? OT LONG TERM GOAL #1  ? Title Pt will be independent with updated strengthening HEP for bilateral UEs/R wrist (once cleared by MD).--check LTGs 03/25/22   ? Time 12   ? Period Weeks   ? Status On-going   ?  ? OT LONG TERM GOAL #2  ? Title Pt will perform simple clooking and cleaning tasks mod I.   ? Time 12   ? Period Weeks   ?  Status On-going   ?  ? OT LONG TERM GOAL #3  ? Title Pt will be able to complete IADL task for at least 32mn prior to rest break.   ? Time 12   ? Period Weeks   ? Status On-going   02/15/22:  10-289m  ?  ? OT LONG TERM GOAL #4  ? Title Pt will demo at  least 35lbs R grip strength for lifting/carrying objects and opening containers.   ? Time 12   ? Period Weeks   ? Status On-going   01/29/22: Rt = 19.1 lbs, 02/22/22: Rt = 25.3 lbs  (Lt = 44.7 lbs)  ?  ? OT LONG TERM GOAL #5  ? Title Pt will demo at least 60* wrist flex/ext for ADLs/IADLs.   ? Time 12   ? Period Weeks   ? Status On-going   flex = 65*, ext = 60*; 02/22/22: flex = 70*, ext = 55*  ? ?  ?  ? ?  ? ? ? ? ? ? ? ? Plan - 02/22/22 1024   ? ? Clinical Impression Statement Pt is progressing towards goals with improving wrist and hand function RUE. Pt has lost approx 5* in wrist extension and encouraged to continue prayer stretch. Pt is newly diagnosed with bursitis in right shoulder. Pt also reports fluid in wrist (per MD). Pt has mild pain Rt shoulder and wrist   ? OT Occupational Profile and History Detailed Assessment- Review of Records and additional review of physical, cognitive, psychosocial history related to current functional performance   ? Occupational performance deficits (Please refer to evaluation for details): ADL's;IADL's;Work;Leisure   ? Body Structure / Function / Physical Skills ADL;Decreased knowledge of use of DME;Strength;Balance;UE functional use;ROM;IADL;Endurance;Mobility   ? Rehab Potential Good   ? Clinical Decision Making Several treatment options, min-mod task modification necessary   ? Comorbidities Affecting Occupational Performance: May have comorbidities impacting occupational performance   ? Modification or Assistance to Complete Evaluation  No modification of tasks or assist necessary to complete eval   ? OT Frequency 2x / week   ? OT Duration 12 weeks   +eval, may modify based on scheduling needs/when precautions are lifted  ? OT Treatment/Interventions Self-care/ADL training;Moist Heat;Fluidtherapy;DME and/or AE instruction;Splinting;Therapeutic activities;Balance training;Contrast Bath;Ultrasound;Therapeutic exercise;Passive range of motion;Functional Mobility  Training;Neuromuscular education;Cryotherapy;Electrical Stimulation;Paraffin;Manual Therapy;Patient/family education;Energy conservation   ? Plan check on authrization of additional visits, continue to address Rt sho

## 2022-02-23 ENCOUNTER — Ambulatory Visit (INDEPENDENT_AMBULATORY_CARE_PROVIDER_SITE_OTHER): Payer: No Typology Code available for payment source | Admitting: Physician Assistant

## 2022-02-23 ENCOUNTER — Encounter: Payer: Self-pay | Admitting: Physician Assistant

## 2022-02-23 ENCOUNTER — Encounter: Payer: No Typology Code available for payment source | Admitting: Physician Assistant

## 2022-02-23 VITALS — BP 91/64 | HR 81 | Wt 122.0 lb

## 2022-02-23 DIAGNOSIS — M62838 Other muscle spasm: Secondary | ICD-10-CM

## 2022-02-23 DIAGNOSIS — G43109 Migraine with aura, not intractable, without status migrainosus: Secondary | ICD-10-CM

## 2022-02-23 MED ORDER — ONABOTULINUMTOXINA 100 UNITS IJ SOLR
200.0000 [IU] | Freq: Once | INTRAMUSCULAR | Status: AC
  Administered 2022-02-23: 200 [IU] via INTRAMUSCULAR

## 2022-02-23 NOTE — Progress Notes (Signed)
Pt here to F/U on HA's and receive Botox. ? ?Pt states since her stroke her HA's have improved.  ? ?S: Pt in office today for Botox injections. She has typically had great response to the botox for headache prevention.  In late January this year, pt had a fall on the stairs which resulted in ankle/leg/wrist fractures.  She had surgery for ankle.  One week later her carotid artery tore and she suffered a frontal stroke.  She has been working with PT/OT/ST since that time to get back her functional level.  She notes she feels more flat.  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  Her headaches have been improved overall but she notes she is having them at least half the time.  She now has aura with her headaches.   Her botox is long worn off.  ?O: BP 91/64   Pulse 81   Wt 122 lb (55.3 kg)   BMI 19.11 kg/m?   ? ?Botox Procedure Note ?Vial of Botox was : Sent direct to office from pharmacy ? ?Botox Dosing by [employer] for Chronic Migraine ? ? ?Injection Sites for Migraines ? ?Botox 155 units was injected using the dosage in the table above in the pattern shown above. ? ?A:  ?1. Migraine with aura and without status migrainosus, not intractable   ?2. Muscle spasm   ? ? ? ?P: Botox 155 units injected today. (45 waste) ? ?Of note - I do wonder if her headaches have improved because she's no longer working or because she's not working night shift.  Hopefully it will continue. ? ?RTC 3 Months/PRN.  ?  ? ?

## 2022-02-23 NOTE — Addendum Note (Signed)
Addended by: Crosby Oyster on: 02/23/2022 12:12 PM ? ? Modules accepted: Orders ? ?

## 2022-02-23 NOTE — Patient Instructions (Signed)
Botulinum Toxin Cosmetic Injection ?Botulinum toxin injection is a procedure to soften lines and wrinkles in the face. Botulinum toxin is produced by a bacterium called Clostridium botulinum. The toxin is injected into muscles with a very thin needle. The toxin works by blocking nerve impulses to specific muscles. This weakens and paralyzes those muscles for a short time, which reduces the lines of facial expression. ?Tell a health care provider about: ?Any allergies you have, especially allergies to eggs or milk protein. ?All medicines you are taking, including antibiotics, vitamins, herbs, eye drops, creams, and over-the-counter medicines. ?Any problems you or family members have had with anesthetic medicines. ?Any bleeding problems you have. ?Any surgeries you have had. ?Any medical conditions you have, including neurological disorders, hyperthyroidism, lung disease, or heart disease. ?Whether you are pregnant, may be pregnant, or are breastfeeding. ?What are the risks? ?Generally, this is a safe procedure. However, complications may occur. Common complications include pain, swelling, and bruising at the injection sites. Other problems may include: ?Pain in the neck or jaw. ?Allergic reaction to the toxin. ?Infection. ?Drooping of the eyebrow or eyelid. ?Double or blurred vision. ?Changes in voice or speech. ?Losing the ability to close one or both eyes. ?Trouble swallowing. ?What happens before the procedure? ?Ask your health care provider about: ?Changing or stopping your regular medicines. This is especially important if you are taking diabetes medicines or blood thinners. ?Taking medicines such as aspirin and ibuprofen. These medicines can thin your blood. Do not take these medicines unless your health care provider tells you to take them. ?Taking over-the-counter medicines, vitamins, herbs, and supplements. ?Do not drink alcohol if: ?Your health care provider tells you not to drink. ?You are pregnant, may be  pregnant, or are planning to become pregnant. ?If you drink alcohol: ?Limit how much you have to: ?0-1 drink a day for women. ?0-2 drinks a day for men. ?Be aware of how much alcohol is in your drink. In the [place] one drink equals one 12 oz bottle of beer (355 mL), one 5 oz glass of wine (148 mL), or one 1? oz glass of hard liquor (44 mL). ?What happens during the procedure? ? ?The treatment site may have ice applied or you may be given a medicine to numb the area (local anesthetic) to reduce discomfort. ?Your health care provider will inject small amounts of the toxin into specific muscles. ?The number of injections that you get will depend on your specific condition and the area that is being treated. ?The procedure may vary among health care providers and hospitals. ?What can I expect after the procedure? ?After your procedure, it is common to have: ?Mild pain, soreness, swelling, itching, or redness around the injection sites. Redness may last for 1-2 days. In rare cases, it may last longer than 2 days. ?Small bruises around the injection sites. These will go away in a few days. ?Bumps or marks from the needle. These will go away within a few hours. ?Brief numbness near the injection sites. ?Headache. This is rare in cosmetic treatment. ?The results of your treatment may take up to 14 days to fully show, although many people will see the benefits in 3-5 days after treatment. In most people, the benefits last about 3-4 months. The effectiveness of botulinum toxin will last longer with follow-up treatments. ?Follow these instructions at home: ?Relieving pain and discomfort ? ?Take over-the-counter and prescription medicines only as told by your health care provider. ?If directed, apply ice to the injected area: ?Put  ice in a plastic bag. ?Place a towel between your skin and the bag. ?Leave the ice on for 20 minutes, 2-3 times per day. ?General instructions ?Do not lie down for 4 hours after treatment or as told by  your health care provider. ?Do not rub the injected area for at least 4 hours after the procedure. This can spread the botulinum toxin to surrounding muscles. ?Depending on the injection site: ?Your health care provider may ask you to avoid moving the muscles in the injected area. Follow instructions from your health care provider. ?Your health care provider may tell you to frown or squint regularly. You may need to do these facial exercises every 15 minutes for 1 hour after treatment, or as told by your health care provider. Follow instructions from your health care provider. ?Do not do any strenuous activity for 12 hours after the procedure or for as long as told by your health care provider. This includes lifting heavy items, working out, doing yoga, or doing activities that increase your heart rate. ?Do not get laser treatments, facials, or facial massages for 1-2 weeks after the procedure or for as long as told by your health care provider. ?Keep all follow-up visits as told by your health care provider. This is important. ?Contact a health care provider if: ?You have a headache that gets worse. ?You have a fever. ?Get help right away if you: ?You develop signs of an allergic reaction. These include: ?An itchy rash or welts. ?Wheezing or shortness of breath. ?Dizziness. ?Your eyelids become droopy or swollen. ?You have trouble speaking. ?You feel short of breath or have trouble breathing. ?You have trouble swallowing. ?Summary ?Botulinum toxin injection is a procedure to soften lines and wrinkles in the face. ?This is a safe procedure. However, some problems may occur, including infection, drooping eyelid, double or blurred vision, and trouble swallowing. ?Follow your health care provider's instructions before the procedure. These may include stopping or changing medicines or stopping alcohol use. ?You will be told how to care for yourself after the procedure. ?Get help right away if you develop signs of an  allergic reaction. Also, get help right away if your eyelids become droopy or swollen, you have trouble speaking or swallowing, or you feel short of breath or have trouble breathing. ?This information is not intended to replace advice given to you by your health care provider. Make sure you discuss any questions you have with your health care provider. ?Document Revised: 07/27/2021 Document Reviewed: 07/27/2021 ?Elsevier Patient Education ? [place]. ? ?

## 2022-02-26 ENCOUNTER — Encounter: Payer: Self-pay | Admitting: Neurology

## 2022-02-26 DIAGNOSIS — G43711 Chronic migraine without aura, intractable, with status migrainosus: Secondary | ICD-10-CM

## 2022-02-26 MED ORDER — ATOGEPANT 60 MG PO TABS: 60.0000 mg | ORAL_TABLET | Freq: Every day | ORAL | 6 refills | Status: DC

## 2022-02-26 NOTE — Therapy (Signed)
[facility], [place], [address] ?Phone: [phone]   Fax:  [phone] ? ?Occupational Therapy Treatment ? ?Patient Details  ?Name: Alice Williams ?MRN: [id number] ?Date of Birth: 1961/06/01 ?Referring Provider (OT): Dr. Sarina Ill ? ? ?Encounter Date: 02/27/2022 ? ? OT End of Session - 02/27/22 1108   ? ? Visit Number 13   ? Number of Visits 25   ? Date for OT Re-Evaluation 03/25/22   ? Authorization Type Focus 2023  copay $40 per visit  No Ded , VL: MN  Auth Reqd: after 12th visit   ? Authorization - Visit Number 12   corrected count  ? Authorization - Number of Visits 12   ? OT Start Time 1106   ? OT Stop Time 1145   ? OT Time Calculation (min) 39 min   ? Activity Tolerance Patient tolerated treatment well   ? Behavior During Therapy [facility] for tasks assessed/performed   ? ?  ?  ? ?  ? ? ?Past Medical History:  ?Diagnosis Date  ? Anal fissure   ? Anemia   ? in early 20's  ? Anxiety   ? Arthritis   ? back  ? Breast calcifications on mammogram   ? Cancer ([place])   ? pre cancer colon,cervix  ? Colon polyp   ? Dyslipidemia 06/24/2018  ? Elevated cholesterol   ? Facet arthropathy, lumbosacral 08/28/2017  ? Family history of adverse reaction to anesthesia   ? sibling has N/V  ? GERD (gastroesophageal reflux disease)   ? Heart murmur   ? History of colon polyps   ? History of kidney stones   ? has small ones  ? IBS (irritable bowel syndrome)   ? Inappropriate sinus tachycardia   ? Migraines   ? Osteopenia   ? Osteoporosis of femur without pathological fracture 08/28/2017  ? Palpitations   ? PONV (postoperative nausea and vomiting)   ? Premature surgical menopause   ? PTSD (post-traumatic stress disorder)   ? Sleep apnea   ? ? ?Past Surgical History:  ?Procedure Laterality Date  ? ANAL FISSURE REPAIR    ? APPENDECTOMY  2000  ? COLONOSCOPY W/ POLYPECTOMY  03/2015  ? COLONOSCOPY WITH ESOPHAGOGASTRODUODENOSCOPY (EGD)  04/01/2015  ? KNEE SURGERY     ? OOPHORECTOMY    ? OOPHORECTOMY    ? 1995, 1998  ? ORIF ANKLE FRACTURE Right 11/28/2021  ? Procedure: OPEN REDUCTION INTERNAL FIXATION (ORIF) ANKLE FRACTURE;  Surgeon: Meredith Pel, MD;  Location: [facility];  Service: Orthopedics;  Laterality: Right;  ? PELVIC LAPAROSCOPY    ? x 4 for endometriosis   ? VAGINAL HYSTERECTOMY  1994  ? ? ?There were no vitals filed for this visit. ? ? Subjective Assessment - 02/27/22 1106   ? ? Subjective  Pt reports that she received Botox Friday (receives it for migraines and also got some in R shoulder)   ? Limitations 01/05/22- received after therapy session was completed - per MD-Okay for AROM and PROM of right wrist. Okay for strengthening exercises in the brace  We will dc brace in ~2 weeks as long as she is doing okay on exam at that point, (pt is cleared for walker use and WB through RLE also)   ? Patient Stated Goals get back to where I was before (return to living alone and go back to work)   ? Currently in Pain? Yes   ? Pain Score 5    ?  Pain Location Shoulder   ? Pain Orientation Right   ? Pain Descriptors / Indicators Sore   ? Pain Type Acute pain   ? Pain Onset More than a month ago   ? Pain Frequency Intermittent   ? Aggravating Factors  sleeping on R side, first thing in morning, reaching higher   ? Pain Relieving Factors stretches   ? ?  ?  ? ?  ? ? ? ?Supine, closed-chain shoulder flex and abduction with cane with min v.c. ? ?Reviewed additional shoulder HEP and scapula stabilization HEP from last session. ? ?Light wt. Bearing through bilateral hands on edge of mat with forward lean for wrist stretch and incr shoulder stabilization (min-mod cueing for scapular depression, slight ER of shoulder, and head/chest up).   Pt reports incr comfort with repetition. ? ?Gentle joint mobs to R wrist.   ? ?Light wall push ups with min-mod cueing for shoulder positioning--no pain. ? ?Sitting, closed-chain shoulder flex with BUEs with ball with min cueing for R shoulder  positioning and avoiding compensation. ? ?Pt instructed in proper shoulder positioning and avoid guarding patterns.  Pt verbalized understanding. ? ?Red putty exercise for gross grasp and pinch for incr strength. ? ? ? ? ? OT Education - 02/27/22 1208   ? ? Education Details additional putty HEP--see pt instructions   ? Person(s) Educated Patient   ? Methods Explanation;Handout;Demonstration   ? Comprehension Verbalized understanding;Returned demonstration   ? ?  ?  ? ?  ? ? ? OT Short Term Goals - 02/15/22 1111   ? ?  ? OT SHORT TERM GOAL #1  ? Title Pt will be independent with initial HEP for strength/activity tolerance.--check STGs 01/23/22   ? Time 4   ? Period Weeks   ? Status Achieved   ?  ? OT SHORT TERM GOAL #2  ? Title Pt will be independent with initial HEP for R wrist ROM (once cleared by MD).   ? Time 4   ? Period Weeks   ? Status Achieved   ?  ? OT SHORT TERM GOAL #3  ? Title Pt will perform BADLs mod I.   ? Time 4   ? Period Weeks   ? Status Achieved   ?  ? OT SHORT TERM GOAL #4  ? Title Pt will perform simple home maintenance/snack prep with supervision.   ? Time 4   ? Period Weeks   ? Status Achieved   ? ?  ?  ? ?  ? ? ? ? OT Long Term Goals - 02/27/22 1209   ? ?  ? OT LONG TERM GOAL #1  ? Title Pt will be independent with updated strengthening HEP for bilateral UEs/R wrist (once cleared by MD).--check LTGs 03/25/22   ? Time 12   ? Period Weeks   ? Status On-going   ?  ? OT LONG TERM GOAL #2  ? Title Pt will perform simple clooking and cleaning tasks mod I.   ? Time 12   ? Period Weeks   ? Status On-going   02/27/22:  needs assist/difficulty for heavier pots/pans, chopping, high reaching  ?  ? OT LONG TERM GOAL #3  ? Title Pt will be able to complete IADL task for at least 59mn prior to rest break.   ? Time 12   ? Period Weeks   ? Status On-going   02/15/22:  10-229m  ?  ? OT LONG TERM GOAL #4  ? Title  Pt will demo at least 35lbs R grip strength for lifting/carrying objects and opening containers.   ?  Time 12   ? Period Weeks   ? Status On-going   01/29/22: Rt = 19.1 lbs, 02/22/22: Rt = 25.3 lbs  (Lt = 44.7 lbs)  ?  ? OT LONG TERM GOAL #5  ? Title Pt will demo at least 60* wrist flex/ext for ADLs/IADLs.   ? Time 12   ? Period Weeks   ? Status On-going   flex = 65*, ext = 60*; 02/22/22: flex = 70*, ext = 55*  ? ?  ?  ? ?  ? ? ? ? ? ? ? ? Plan - 02/27/22 1126   ? ? Clinical Impression Statement Pt is progressing towards goals.  Pt reports no pain during therapy exercises today and tolerated gentle wt. bearing through R hand.   ? OT Occupational Profile and History Detailed Assessment- Review of Records and additional review of physical, cognitive, psychosocial history related to current functional performance   ? Occupational performance deficits (Please refer to evaluation for details): ADL's;IADL's;Work;Leisure   ? Body Structure / Function / Physical Skills ADL;Decreased knowledge of use of DME;Strength;Balance;UE functional use;ROM;IADL;Endurance;Mobility   ? Rehab Potential Good   ? Clinical Decision Making Several treatment options, min-mod task modification necessary   ? Comorbidities Affecting Occupational Performance: May have comorbidities impacting occupational performance   ? Modification or Assistance to Complete Evaluation  No modification of tasks or assist necessary to complete eval   ? OT Frequency 2x / week   ? OT Duration 12 weeks   +eval, may modify based on scheduling needs/when precautions are lifted  ? OT Treatment/Interventions Self-care/ADL training;Moist Heat;Fluidtherapy;DME and/or AE instruction;Splinting;Therapeutic activities;Balance training;Contrast Bath;Ultrasound;Therapeutic exercise;Passive range of motion;Functional Mobility Training;Neuromuscular education;Cryotherapy;Electrical Stimulation;Paraffin;Manual Therapy;Patient/family education;Energy conservation   ? Plan continue to address Rt shoulder pain, Rt wrist/hand strengthening, dynamic standing balance, gentle wt. bearing   ?  Consulted and Agree with Plan of Care Patient   ? ?  ?  ? ?  ? ? ?Patient will benefit from skilled therapeutic intervention in order to improve the following deficits and impairments:   ?Body Structure / Functi

## 2022-02-27 ENCOUNTER — Encounter: Payer: Self-pay | Admitting: Occupational Therapy

## 2022-02-27 ENCOUNTER — Ambulatory Visit: Payer: No Typology Code available for payment source | Attending: Neurology | Admitting: Physical Therapy

## 2022-02-27 ENCOUNTER — Ambulatory Visit: Payer: No Typology Code available for payment source | Admitting: Occupational Therapy

## 2022-02-27 DIAGNOSIS — R2681 Unsteadiness on feet: Secondary | ICD-10-CM | POA: Diagnosis present

## 2022-02-27 DIAGNOSIS — M25511 Pain in right shoulder: Secondary | ICD-10-CM | POA: Diagnosis present

## 2022-02-27 DIAGNOSIS — M6281 Muscle weakness (generalized): Secondary | ICD-10-CM | POA: Diagnosis present

## 2022-02-27 DIAGNOSIS — M25631 Stiffness of right wrist, not elsewhere classified: Secondary | ICD-10-CM | POA: Diagnosis present

## 2022-02-27 DIAGNOSIS — I69354 Hemiplegia and hemiparesis following cerebral infarction affecting left non-dominant side: Secondary | ICD-10-CM

## 2022-02-27 DIAGNOSIS — R2689 Other abnormalities of gait and mobility: Secondary | ICD-10-CM | POA: Diagnosis present

## 2022-02-27 DIAGNOSIS — M25611 Stiffness of right shoulder, not elsewhere classified: Secondary | ICD-10-CM | POA: Insufficient documentation

## 2022-02-27 NOTE — Patient Instructions (Signed)
? ? ?  Finger and Thumb Extension (Resistive Putty) ? ? ?With thumb and all fingers in center of putty donut, stretch out. ?Repeat 5-10 times. Do 1-2 sessions per day. ? ? ? ? ? ?IP Fisting (Resistive Putty) ? ? ?Keeping knuckles straight, bend fingertips to squeeze putty. ?Repeat 10 times. Do 1-2 sessions per day. ? ?MP Flexion (Resistive Putty) ? ? ?Bending only at large knuckles, press putty down against thumb. Keep fingertips straight. ?Repeat 10 times. Do 1-2 sessions per day. ? ?

## 2022-02-27 NOTE — Therapy (Signed)
?OUTPATIENT PHYSICAL THERAPY TREATMENT NOTE ? ? ?Patient Name: Alice Williams ?MRN: [id number] ?DOB:[birth year], 61 y.o., female ?Today's Date: 02/27/2022 ? ?PCP: Mackie Pai, PA-C ?REFERRING PROVIDER: Melvenia Beam, MD  ? ? PT End of Session - 02/27/22 1023   ? ? Visit Number 15   ? Number of Visits 25   ? Date for PT Re-Evaluation 03/14/22   ? Authorization Type Focus ([facility]) Needs auth after 12th visit   ? Authorization Time Period 12 visits from 02/22/22-04/06/22   ? Authorization - Visit Number 1   ? Authorization - Number of Visits 12   ? PT Start Time 1022   Previous session ran late  ? PT Stop Time 1102   ? PT Time Calculation (min) 40 min   ? Activity Tolerance Patient tolerated treatment well   Orthostatic  ? Behavior During Therapy [facility] for tasks assessed/performed   ? ?  ?  ? ?  ? ? ? ? ? ? ? ? ? ? ? ? ? ?Past Medical History:  ?Diagnosis Date  ? Anal fissure   ? Anemia   ? in early 20's  ? Anxiety   ? Arthritis   ? back  ? Breast calcifications on mammogram   ? Cancer ([place])   ? pre cancer colon,cervix  ? Colon polyp   ? Dyslipidemia 06/24/2018  ? Elevated cholesterol   ? Facet arthropathy, lumbosacral 08/28/2017  ? Family history of adverse reaction to anesthesia   ? sibling has N/V  ? GERD (gastroesophageal reflux disease)   ? Heart murmur   ? History of colon polyps   ? History of kidney stones   ? has small ones  ? IBS (irritable bowel syndrome)   ? Inappropriate sinus tachycardia   ? Migraines   ? Osteopenia   ? Osteoporosis of femur without pathological fracture 08/28/2017  ? Palpitations   ? PONV (postoperative nausea and vomiting)   ? Premature surgical menopause   ? PTSD (post-traumatic stress disorder)   ? Sleep apnea   ? ?Past Surgical History:  ?Procedure Laterality Date  ? ANAL FISSURE REPAIR    ? APPENDECTOMY  2000  ? COLONOSCOPY W/ POLYPECTOMY  03/2015  ? COLONOSCOPY WITH ESOPHAGOGASTRODUODENOSCOPY (EGD)  04/01/2015  ? KNEE SURGERY    ? OOPHORECTOMY    ? OOPHORECTOMY    ? 1995, 1998  ? ORIF  ANKLE FRACTURE Right 11/28/2021  ? Procedure: OPEN REDUCTION INTERNAL FIXATION (ORIF) ANKLE FRACTURE;  Surgeon: Meredith Pel, MD;  Location: [facility];  Service: Orthopedics;  Laterality: Right;  ? PELVIC LAPAROSCOPY    ? x 4 for endometriosis   ? VAGINAL HYSTERECTOMY  1994  ? ?Patient Active Problem List  ? Diagnosis Date Noted  ? Migraine with aura and without status migrainosus, not intractable 02/23/2022  ? Acute right MCA stroke ([place]) 12/07/2021  ? GERD without esophagitis 12/07/2021  ? Right wrist fracture, with routine healing, subsequent encounter 12/07/2021  ? Bimalleolar ankle fracture, right, closed, with routine healing, subsequent encounter   ? Blepharospasm 11/08/2021  ? Sleep disorder, circadian, shift work type 08/01/2021  ? Excessive daytime sleepiness 08/01/2021  ? Morning headache 02/16/2021  ? Left eye pain 02/16/2021  ? Intractable episodic cluster headache 02/16/2021  ? Behavioral insomnia of childhood 02/16/2021  ? Retrognathia 02/16/2021  ? Chronic migraine without aura, with intractable migraine, so stated, with status migrainosus 01/12/2021  ? Muscle spasm 10/14/2020  ? Vitamin D deficiency 08/15/2020  ? Acute bilateral low back pain with  bilateral sciatica 07/13/2020  ? Gastric polyp 02/11/2019  ? Sessile colonic polyp 02/11/2019  ? Chronic fatigue 02/11/2019  ? Grade I internal hemorrhoids 02/11/2019  ? Ductal hyperplasia of breast 08/21/2018  ? Cognitive changes 06/24/2018  ? Dyslipidemia 06/24/2018  ? Dense breast tissue on mammogram 04/17/2018  ? Fibrocystic breast determined by biopsy 04/09/2018  ? Family history of breast cancer in mother 04/09/2018  ? Paroxysmal sinus tachycardia ([place]) 03/27/2018  ? Osteoporosis 08/28/2017  ? Facet arthropathy, lumbosacral 08/28/2017  ? Chronic left SI joint pain 08/27/2017  ? Left lumbar radiculopathy 08/27/2017  ? Allergic rhinitis 08/09/2017  ? Disorder of lipoid metabolism 08/09/2017  ? Insomnia secondary to anxiety 08/09/2017  ?  Postmenopausal osteoporosis 08/09/2017  ? History of hysterectomy for benign disease 08/09/2017  ? GAD (generalized anxiety disorder) 08/09/2017  ? Former smoker, stopped smoking in distant past 08/09/2017  ? Minor depressive disorder 08/09/2017  ? Palpitations 03/16/2015  ? Family history of colon cancer 03/02/2015  ? Hiatal hernia with GERD without esophagitis 03/02/2015  ? History of adenomatous polyp of colon 03/02/2015  ? Chronic migraine without aura 10/10/2014  ? History of cardiovascular disorder 01/18/2003  ? ? ?REFERRING DIAG: R53.1 (ICD-10-CM) - Left-sided weakness R41.4 (ICD-10-CM) - Left-sided neglect I63.411 (ICD-10-CM) - Cerebrovascular accident (CVA) due to embolism of right middle cerebral artery (HCC) I63.9,I77.70 (ICD-10-CM) - Cerebral ischemic stroke due to arterial dissection (HCC)  ? ?THERAPY DIAG:  ?Unsteadiness on feet ? ?Muscle weakness (generalized) ? ?Other abnormalities of gait and mobility ? ?PERTINENT HISTORY: SVT, migraine headaches, PTSD, anxiety, irritable bowel syndrome, GERD, osteoporosis, sleep apnea, left sciatica.  ? ?PRECAUTIONS: Fall, L hemi, Full weightbearing R wrist, WBAT RLE  ? ?SUBJECTIVE: Pt reports walking frequently at home outside. Exercises are going well. Had botox injections to R shoulder yesterday to help alleviate pain, has not kicked in yet.  ? ?PAIN:  ?Are you having pain? Yes ?Pain Location: R shoulder (bursitis) and R wrist  ?Pain severity: 5/10  ? ?OBJECTIVE ? ?DIAGNOSTIC FINDINGS: MRI and CTA performed on 02/20/22: ICA compared to 12/14/2021 and 12/07/2021 CTA studies. Although they were apparently not compared at the time, there was already some improvement between the two February studies. There is no longer any stenosis.  ? ?Right MCA occlusion seen on 12/07/2021 is no longer present. ? ?TODAY'S TREATMENT:  ?Pt wore compression sock on BLEs throughout session.  ? ?NMR  ?-Alt. Toe taps to 6" step in front of stairs without BUE support, x30 per side for  improved lateral weight shift, single leg stability and BLE coordination. CGA for safety. Progressed to placing foot on first step then second for prolonged single leg stance time and hip flexion, x30 per side w/CGA.  ? ?Gait Training  ?The following retro treadmill training was completed for aerobic/neural priming, endurance, eccentric BLE control and improved ankle DF of RLE.  ?- Warmup: 3:00 up to 1.1 mph ?- EMOM: 5:00 at 1.0 mph increasing the elevation every minute until reaching 3% incline for improved hamstring and quad strength, BUE support throughout. Min verbal cues for upright posture 2/2 posterior lean as as she fatigued and over-using BUEs  ? ? ?PATIENT EDUCATION: ?Education details: Continuing HEP and importance of stretching at home  ?Person educated: Patient ?Education method: Explanation and Demonstration  ?Education comprehension: verbalized understanding and needs further education ? ? ?HOME EXERCISE PROGRAM: updated on 4/24 ?Access Code: [email or web address] ?URL: [email or web address] ?Date: 02/22/2022 ?Prepared by: Mickie Bail [place] Gandolfo ? ?Exercises ?- Proper Sit to Stand Technique  -  1 x daily - 7 x weekly - 3 sets - 10 reps ?- Seated Hamstring Stretch  - 1 x daily - 7 x weekly - 3 sets - 10 reps - 30 second hold ?- Seated Ankle Alphabet  - 1 x daily - 7 x weekly - 3 sets - 10 reps ?- Long Sitting Ankle Plantar Flexion with Resistance  - 1 x daily - 7 x weekly - 3 sets - 10 reps ?- Long Sitting Ankle Inversion with Resistance  - 1 x daily - 7 x weekly - 3 sets - 10 reps ?- Long Sitting Ankle Eversion with Resistance  - 1 x daily - 7 x weekly - 3 sets - 10 reps ?- Ankle Dorsiflexion with Resistance  - 1 x daily - 7 x weekly - 3 sets - 10 reps ?- Seated Toe Towel Scrunches  - 1 x daily - 7 x weekly - 3 sets - 10 reps ? ? ? ?SHORT TERM GOALS:   Target date: 03/27/2022 ? ?Pt will ascend/descend 12 6" steps using LRAD and no rail mod I w/step-to pattern to imitate home environment and improve  independence  ?Baseline:  ?Goal status: INITIAL ? ?2.  Pt will ambulate 250' w/SPC and S* for improved functional mobility  ?Baseline: using LBQC ?Goal status: INITIAL ? ?3.  Pt will improve 5 x STS to less than or equal to 16 se

## 2022-02-27 NOTE — Therapy (Signed)
[facility] ?[place], [place], [address] ?Phone: [phone]   Fax:  [phone] ? ?Occupational Therapy Treatment ? ?Patient Details  ?Name: Alice Williams ?MRN: [id number] ?Date of Birth: 03/26/1961 ?Referring Provider (OT): Dr. Sarina Ill ? ? ?Encounter Date: 03/01/2022 ? ? OT End of Session - 03/01/22 1106   ? ? Visit Number 14   ? Number of Visits 25   ? Date for OT Re-Evaluation 03/25/22   ? Authorization Type Focus 2023  copay $40 per visit  No Ded , VL: MN  Auth Reqd: after 12th visit.  Additional 10 visits approved per LL.   ? Authorization - Visit Number 13   corrected count  ? Authorization - Number of Visits 22   ? OT Start Time 1103   ? OT Stop Time 1145   ? OT Time Calculation (min) 42 min   ? Activity Tolerance Patient tolerated treatment well   ? Behavior During Therapy [facility] for tasks assessed/performed   ? ?  ?  ? ?  ? ? ?Past Medical History:  ?Diagnosis Date  ? Anal fissure   ? Anemia   ? in early 20's  ? Anxiety   ? Arthritis   ? back  ? Breast calcifications on mammogram   ? Cancer ([place])   ? pre cancer colon,cervix  ? Colon polyp   ? Dyslipidemia 06/24/2018  ? Elevated cholesterol   ? Facet arthropathy, lumbosacral 08/28/2017  ? Family history of adverse reaction to anesthesia   ? sibling has N/V  ? GERD (gastroesophageal reflux disease)   ? Heart murmur   ? History of colon polyps   ? History of kidney stones   ? has small ones  ? IBS (irritable bowel syndrome)   ? Inappropriate sinus tachycardia   ? Migraines   ? Osteopenia   ? Osteoporosis of femur without pathological fracture 08/28/2017  ? Palpitations   ? PONV (postoperative nausea and vomiting)   ? Premature surgical menopause   ? PTSD (post-traumatic stress disorder)   ? Sleep apnea   ? ? ?Past Surgical History:  ?Procedure Laterality Date  ? ANAL FISSURE REPAIR    ? APPENDECTOMY  2000  ? COLONOSCOPY W/ POLYPECTOMY  03/2015  ? COLONOSCOPY WITH  ESOPHAGOGASTRODUODENOSCOPY (EGD)  04/01/2015  ? KNEE SURGERY    ? OOPHORECTOMY    ? OOPHORECTOMY    ? 1995, 1998  ? ORIF ANKLE FRACTURE Right 11/28/2021  ? Procedure: OPEN REDUCTION INTERNAL FIXATION (ORIF) ANKLE FRACTURE;  Surgeon: Meredith Pel, MD;  Location: [facility];  Service: Orthopedics;  Laterality: Right;  ? PELVIC LAPAROSCOPY    ? x 4 for endometriosis   ? VAGINAL HYSTERECTOMY  1994  ? ? ?There were no vitals filed for this visit. ? ? Subjective Assessment - 03/01/22 1103   ? ? Subjective  Shoulder hurt more this morning--slept on it wrong (didn't get pillow under it), but feels better now.  Pt reports that she drained pot of noodles at home with supervision, (son reports she was shaky)   ? Limitations 01/05/22- received after therapy session was completed - per MD-Okay for AROM and PROM of right wrist. Okay for strengthening exercises in the brace  We will dc brace in ~2 weeks as long as she is doing okay on exam at that point, (pt is cleared for walker use and WB through RLE also)   ? Patient Stated Goals get back to where I was before (  return to living alone and go back to work)   ? Currently in Pain? Yes   ? Pain Score 7    ? Pain Location Shoulder   ? Pain Orientation Right   ? Pain Descriptors / Indicators Sore   ? Pain Type Acute pain   ? Pain Onset More than a month ago   ? Pain Frequency Intermittent   ? Aggravating Factors  sleeping on R side, reaching back/behind   ? Pain Relieving Factors stretches--pt reports only stretch by end of session   ? ?  ?  ? ?  ? ? ? ? ? ?Supine, scapular retraction and depression with min cueing. ? ?Supine, closed-chain shoulder flex with ball with BUEs with min v.c. ? ?Prone, scapular retractions with min cueing.  Wt. Bearing on elbows with head/chest lift for scapular depression. ? ?Light wt. Bearing through bilateral hands on edge of mat with forward lean for wrist stretch and incr shoulder stabilization (min-mod cueing for scapular depression, slight ER of  shoulder, and head/chest up).   Pt reports incr comfort with repetition. ? ?AAROM shoulder flex in standing with UE ranger and with BUEs with ball up wall with min facilitation/cueing for shoulder compensation/hike. ? ?Light wall push ups with min-mod cueing for shoulder positioning--no pain. ? ?Sitting, closed-chain shoulder flex with BUEs with ball and cane ex for shoulder abduction with min cueing for R shoulder positioning and avoiding compensation. ? ?Wrist flex/ext, UD/RD, supination/pronation with 3lb wt. X15 each with min v.c. to slow down for incr stretch at end ranges. ? ?Red putty HEP review:  gross grasp, pinch, isolated IP flex, isolated MP flex, gross finger finger ext. With min cueing for shoulder hike. ? ? ? ? ? ? OT Short Term Goals - 02/15/22 1111   ? ?  ? OT SHORT TERM GOAL #1  ? Title Pt will be independent with initial HEP for strength/activity tolerance.--check STGs 01/23/22   ? Time 4   ? Period Weeks   ? Status Achieved   ?  ? OT SHORT TERM GOAL #2  ? Title Pt will be independent with initial HEP for R wrist ROM (once cleared by MD).   ? Time 4   ? Period Weeks   ? Status Achieved   ?  ? OT SHORT TERM GOAL #3  ? Title Pt will perform BADLs mod I.   ? Time 4   ? Period Weeks   ? Status Achieved   ?  ? OT SHORT TERM GOAL #4  ? Title Pt will perform simple home maintenance/snack prep with supervision.   ? Time 4   ? Period Weeks   ? Status Achieved   ? ?  ?  ? ?  ? ? ? ? OT Long Term Goals - 02/27/22 1209   ? ?  ? OT LONG TERM GOAL #1  ? Title Pt will be independent with updated strengthening HEP for bilateral UEs/R wrist (once cleared by MD).--check LTGs 03/25/22   ? Time 12   ? Period Weeks   ? Status On-going   ?  ? OT LONG TERM GOAL #2  ? Title Pt will perform simple clooking and cleaning tasks mod I.   ? Time 12   ? Period Weeks   ? Status On-going   02/27/22:  needs assist/difficulty for heavier pots/pans, chopping, high reaching  ?  ? OT LONG TERM GOAL #3  ? Title Pt will be able to complete  IADL task  for at least 53mn prior to rest break.   ? Time 12   ? Period Weeks   ? Status On-going   02/15/22:  10-250m  ?  ? OT LONG TERM GOAL #4  ? Title Pt will demo at least 35lbs R grip strength for lifting/carrying objects and opening containers.   ? Time 12   ? Period Weeks   ? Status On-going   01/29/22: Rt = 19.1 lbs, 02/22/22: Rt = 25.3 lbs  (Lt = 44.7 lbs)  ?  ? OT LONG TERM GOAL #5  ? Title Pt will demo at least 60* wrist flex/ext for ADLs/IADLs.   ? Time 12   ? Period Weeks   ? Status On-going   flex = 65*, ext = 60*; 02/22/22: flex = 70*, ext = 55*  ? ?  ?  ? ?  ? ? ? ? ? ? ? ? Plan - 03/01/22 1112   ? ? Clinical Impression Statement Pt is progressing towards goals with improving R shoulder pain and activity tolerance.   ? OT Occupational Profile and History Detailed Assessment- Review of Records and additional review of physical, cognitive, psychosocial history related to current functional performance   ? Occupational performance deficits (Please refer to evaluation for details): ADL's;IADL's;Work;Leisure   ? Body Structure / Function / Physical Skills ADL;Decreased knowledge of use of DME;Strength;Balance;UE functional use;ROM;IADL;Endurance;Mobility   ? Rehab Potential Good   ? Clinical Decision Making Several treatment options, min-mod task modification necessary   ? Comorbidities Affecting Occupational Performance: May have comorbidities impacting occupational performance   ? Modification or Assistance to Complete Evaluation  No modification of tasks or assist necessary to complete eval   ? OT Frequency 2x / week   ? OT Duration 12 weeks   +eval, may modify based on scheduling needs/when precautions are lifted  ? OT Treatment/Interventions Self-care/ADL training;Moist Heat;Fluidtherapy;DME and/or AE instruction;Splinting;Therapeutic activities;Balance training;Contrast Bath;Ultrasound;Therapeutic exercise;Passive range of motion;Functional Mobility Training;Neuromuscular  education;Cryotherapy;Electrical Stimulation;Paraffin;Manual Therapy;Patient/family education;Energy conservation   ? Plan continue to address Rt shoulder pain, Rt wrist/hand strengthening, dynamic standing balance, gentle wt. bearing   ? Consulted and Agr

## 2022-02-28 ENCOUNTER — Ambulatory Visit (INDEPENDENT_AMBULATORY_CARE_PROVIDER_SITE_OTHER): Payer: No Typology Code available for payment source | Admitting: Family Medicine

## 2022-02-28 VITALS — BP 98/64 | Ht 67.0 in | Wt 122.0 lb

## 2022-02-28 DIAGNOSIS — M8080XA Other osteoporosis with current pathological fracture, unspecified site, initial encounter for fracture: Secondary | ICD-10-CM | POA: Diagnosis not present

## 2022-02-28 DIAGNOSIS — E559 Vitamin D deficiency, unspecified: Secondary | ICD-10-CM | POA: Diagnosis not present

## 2022-02-28 NOTE — Assessment & Plan Note (Signed)
Check vitamin D. 

## 2022-02-28 NOTE — Progress Notes (Signed)
?  Alice Williams - 61 y.o. female MRN [id number]  Date of birth: Aug 19, 1961 ? ?SUBJECTIVE:  Including CC & ROS.  ?No chief complaint on file. ? ? ?Alice Williams is a 61 y.o. female that is following up for her osteoporosis.  In the last year she has had a bimalleolar ankle fracture with a fibular shaft fracture.  She also had a stroke. ? ? ?Review of Systems ?See HPI  ? ?HISTORY: Past Medical, Surgical, Social, and Family History Reviewed & Updated per EMR.   ?Pertinent Historical Findings include: ? ?Past Medical History:  ?Diagnosis Date  ? Anal fissure   ? Anemia   ? in early 20's  ? Anxiety   ? Arthritis   ? back  ? Breast calcifications on mammogram   ? Cancer ([place])   ? pre cancer colon,cervix  ? Colon polyp   ? Dyslipidemia 06/24/2018  ? Elevated cholesterol   ? Facet arthropathy, lumbosacral 08/28/2017  ? Family history of adverse reaction to anesthesia   ? sibling has N/V  ? GERD (gastroesophageal reflux disease)   ? Heart murmur   ? History of colon polyps   ? History of kidney stones   ? has small ones  ? IBS (irritable bowel syndrome)   ? Inappropriate sinus tachycardia   ? Migraines   ? Osteopenia   ? Osteoporosis of femur without pathological fracture 08/28/2017  ? Palpitations   ? PONV (postoperative nausea and vomiting)   ? Premature surgical menopause   ? PTSD (post-traumatic stress disorder)   ? Sleep apnea   ? ? ?Past Surgical History:  ?Procedure Laterality Date  ? ANAL FISSURE REPAIR    ? APPENDECTOMY  2000  ? COLONOSCOPY W/ POLYPECTOMY  03/2015  ? COLONOSCOPY WITH ESOPHAGOGASTRODUODENOSCOPY (EGD)  04/01/2015  ? KNEE SURGERY    ? OOPHORECTOMY    ? OOPHORECTOMY    ? 1995, 1998  ? ORIF ANKLE FRACTURE Right 11/28/2021  ? Procedure: OPEN REDUCTION INTERNAL FIXATION (ORIF) ANKLE FRACTURE;  Surgeon: Meredith Pel, MD;  Location: [facility];  Service: Orthopedics;  Laterality: Right;  ? PELVIC LAPAROSCOPY    ? x 4 for endometriosis   ? VAGINAL HYSTERECTOMY  1994  ? ? ? ?PHYSICAL EXAM:  ?VS: BP 98/64   Ht 5'  7" (1.702 m)   Wt 122 lb (55.3 kg)   BMI 19.11 kg/m?  ?Physical Exam ?Gen: NAD, alert, cooperative with exam, well-appearing ?MSK:  ?Neurovascularly intact   ? ? ? ? ?ASSESSMENT & PLAN:  ? ?Osteoporosis ?Acute on chronic in nature.  She has a recent history of s/p right ankle ORIF on 12/06/2021.  She also has a recent stroke so we will have to avoid pursuing Evenity starting in February 2023. ?-Continue Prolia for now. ?-Check vitamin D. ?-Get updated bone density in October. ? ?Vitamin D deficiency ?Check vitamin D. ? ? ? ? ?

## 2022-02-28 NOTE — Assessment & Plan Note (Signed)
Acute on chronic in nature.  She has a recent history of s/p right ankle ORIF on 12/06/2021.  She also has a recent stroke so we will have to avoid pursuing Evenity starting in February 2023. ?-Continue Prolia for now. ?-Check vitamin D. ?-Get updated bone density in October. ?

## 2022-02-28 NOTE — Patient Instructions (Signed)
Good to see you ?We'll check a vitmain D  ?We'll continue prolia  ?We'll get a bone density in October   ?Please send me a message in MyChart with any questions or updates.  ?Please see me back in 6-12 months.  ? ?--Dr. Raeford Razor ? ?

## 2022-03-01 ENCOUNTER — Encounter: Payer: Self-pay | Admitting: Occupational Therapy

## 2022-03-01 ENCOUNTER — Ambulatory Visit: Payer: No Typology Code available for payment source | Admitting: Occupational Therapy

## 2022-03-01 ENCOUNTER — Other Ambulatory Visit (HOSPITAL_COMMUNITY): Payer: Self-pay

## 2022-03-01 ENCOUNTER — Telehealth: Payer: Self-pay | Admitting: Family Medicine

## 2022-03-01 ENCOUNTER — Ambulatory Visit: Payer: No Typology Code available for payment source | Admitting: Physical Therapy

## 2022-03-01 DIAGNOSIS — I69354 Hemiplegia and hemiparesis following cerebral infarction affecting left non-dominant side: Secondary | ICD-10-CM

## 2022-03-01 DIAGNOSIS — R2689 Other abnormalities of gait and mobility: Secondary | ICD-10-CM

## 2022-03-01 DIAGNOSIS — R2681 Unsteadiness on feet: Secondary | ICD-10-CM

## 2022-03-01 DIAGNOSIS — M6281 Muscle weakness (generalized): Secondary | ICD-10-CM

## 2022-03-01 DIAGNOSIS — M25631 Stiffness of right wrist, not elsewhere classified: Secondary | ICD-10-CM

## 2022-03-01 LAB — VITAMIN D 25 HYDROXY (VIT D DEFICIENCY, FRACTURES): Vit D, 25-Hydroxy: 17 ng/mL — ABNORMAL LOW (ref 30.0–100.0)

## 2022-03-01 MED ORDER — VITAMIN D (ERGOCALCIFEROL) 1.25 MG (50000 UNIT) PO CAPS
50000.0000 [IU] | ORAL_CAPSULE | ORAL | 0 refills | Status: AC
  Filled 2022-03-01 – 2022-03-13 (×2): qty 8, 56d supply, fill #0

## 2022-03-01 NOTE — Therapy (Signed)
?OUTPATIENT PHYSICAL THERAPY TREATMENT NOTE ? ? ?Patient Name: Alice Williams ?MRN: [id number] ?DOB:11/06/1960, 61 y.o., female ?Today's Date: 03/01/2022 ? ?PCP: Mackie Pai, PA-C ?REFERRING PROVIDER: Melvenia Beam, MD  ? ? PT End of Session - 03/01/22 1020   ? ? Visit Number 16   ? Number of Visits 25   ? Date for PT Re-Evaluation 03/14/22   ? Authorization Type Focus ([facility]) Needs auth after 12th visit   ? Authorization Time Period 12 visits from 02/22/22-04/06/22   ? Authorization - Visit Number 1   ? Authorization - Number of Visits 12   ? PT Start Time 1019   ? PT Stop Time 1100   ? PT Time Calculation (min) 41 min   ? Activity Tolerance Patient tolerated treatment well   Orthostatic  ? Behavior During Therapy [facility] for tasks assessed/performed   ? ?  ?  ? ?  ? ? ? ? ? ? ? ? ? ? ? ? ? ? ?Past Medical History:  ?Diagnosis Date  ? Anal fissure   ? Anemia   ? in early 20's  ? Anxiety   ? Arthritis   ? back  ? Breast calcifications on mammogram   ? Cancer ([place])   ? pre cancer colon,cervix  ? Colon polyp   ? Dyslipidemia 06/24/2018  ? Elevated cholesterol   ? Facet arthropathy, lumbosacral 08/28/2017  ? Family history of adverse reaction to anesthesia   ? sibling has N/V  ? GERD (gastroesophageal reflux disease)   ? Heart murmur   ? History of colon polyps   ? History of kidney stones   ? has small ones  ? IBS (irritable bowel syndrome)   ? Inappropriate sinus tachycardia   ? Migraines   ? Osteopenia   ? Osteoporosis of femur without pathological fracture 08/28/2017  ? Palpitations   ? PONV (postoperative nausea and vomiting)   ? Premature surgical menopause   ? PTSD (post-traumatic stress disorder)   ? Sleep apnea   ? ?Past Surgical History:  ?Procedure Laterality Date  ? ANAL FISSURE REPAIR    ? APPENDECTOMY  2000  ? COLONOSCOPY W/ POLYPECTOMY  03/2015  ? COLONOSCOPY WITH ESOPHAGOGASTRODUODENOSCOPY (EGD)  04/01/2015  ? KNEE SURGERY    ? OOPHORECTOMY    ? OOPHORECTOMY    ? 1995, 1998  ? ORIF ANKLE FRACTURE Right  11/28/2021  ? Procedure: OPEN REDUCTION INTERNAL FIXATION (ORIF) ANKLE FRACTURE;  Surgeon: Meredith Pel, MD;  Location: [facility];  Service: Orthopedics;  Laterality: Right;  ? PELVIC LAPAROSCOPY    ? x 4 for endometriosis   ? VAGINAL HYSTERECTOMY  1994  ? ?Patient Active Problem List  ? Diagnosis Date Noted  ? Migraine with aura and without status migrainosus, not intractable 02/23/2022  ? Acute right MCA stroke ([place]) 12/07/2021  ? GERD without esophagitis 12/07/2021  ? Right wrist fracture, with routine healing, subsequent encounter 12/07/2021  ? Bimalleolar ankle fracture, right, closed, with routine healing, subsequent encounter   ? Blepharospasm 11/08/2021  ? Sleep disorder, circadian, shift work type 08/01/2021  ? Excessive daytime sleepiness 08/01/2021  ? Morning headache 02/16/2021  ? Left eye pain 02/16/2021  ? Intractable episodic cluster headache 02/16/2021  ? Behavioral insomnia of childhood 02/16/2021  ? Retrognathia 02/16/2021  ? Chronic migraine without aura, with intractable migraine, so stated, with status migrainosus 01/12/2021  ? Muscle spasm 10/14/2020  ? Vitamin D deficiency 08/15/2020  ? Acute bilateral low back pain with bilateral sciatica 07/13/2020  ?  Gastric polyp 02/11/2019  ? Sessile colonic polyp 02/11/2019  ? Chronic fatigue 02/11/2019  ? Grade I internal hemorrhoids 02/11/2019  ? Ductal hyperplasia of breast 08/21/2018  ? Cognitive changes 06/24/2018  ? Dyslipidemia 06/24/2018  ? Dense breast tissue on mammogram 04/17/2018  ? Fibrocystic breast determined by biopsy 04/09/2018  ? Family history of breast cancer in mother 04/09/2018  ? Paroxysmal sinus tachycardia ([place]) 03/27/2018  ? Osteoporosis 08/28/2017  ? Facet arthropathy, lumbosacral 08/28/2017  ? Chronic left SI joint pain 08/27/2017  ? Left lumbar radiculopathy 08/27/2017  ? Allergic rhinitis 08/09/2017  ? Disorder of lipoid metabolism 08/09/2017  ? Insomnia secondary to anxiety 08/09/2017  ? Postmenopausal osteoporosis  08/09/2017  ? History of hysterectomy for benign disease 08/09/2017  ? GAD (generalized anxiety disorder) 08/09/2017  ? Former smoker, stopped smoking in distant past 08/09/2017  ? Minor depressive disorder 08/09/2017  ? Palpitations 03/16/2015  ? Family history of colon cancer 03/02/2015  ? Hiatal hernia with GERD without esophagitis 03/02/2015  ? History of adenomatous polyp of colon 03/02/2015  ? Chronic migraine without aura 10/10/2014  ? History of cardiovascular disorder 01/18/2003  ? ? ?REFERRING DIAG: R53.1 (ICD-10-CM) - Left-sided weakness R41.4 (ICD-10-CM) - Left-sided neglect I63.411 (ICD-10-CM) - Cerebrovascular accident (CVA) due to embolism of right middle cerebral artery (HCC) I63.9,I77.70 (ICD-10-CM) - Cerebral ischemic stroke due to arterial dissection (HCC)  ? ?THERAPY DIAG:  ?Unsteadiness on feet ? ?Muscle weakness (generalized) ? ?Other abnormalities of gait and mobility ? ?PERTINENT HISTORY: SVT, migraine headaches, PTSD, anxiety, irritable bowel syndrome, GERD, osteoporosis, sleep apnea, left sciatica.  ? ?PRECAUTIONS: Fall, L hemi, Full weightbearing R wrist, WBAT RLE  ? ?SUBJECTIVE: Pt reports increase in R shoulder pain today. Exercises are going well. No other changes to report.  ? ?PAIN:  ?Are you having pain? Yes ?Pain Location: R shoulder (bursitis) and R wrist  ?Pain severity: 6/10  ? ?OBJECTIVE ? ?DIAGNOSTIC FINDINGS: MRI and CTA performed on 02/20/22: ICA compared to 12/14/2021 and 12/07/2021 CTA studies. Although they were apparently not compared at the time, there was already some improvement between the two February studies. There is no longer any stenosis.  ? ?Right MCA occlusion seen on 12/07/2021 is no longer present. ? ?TODAY'S TREATMENT:  ?Pt wore compression sock on BLEs throughout session.  ? ?There Ex   ?Elliptical level 1.5 for 8 minutes w/BUE support for BLE coordination, strength and improved ankle DF ROM. Pt initially demonstrated significant hip rotation to R side when  progressing LLE due to limited ankle ROM which reduced as pt stretched ankle.  ? ?NMR ?Practiced floor transfer x2 w/S* for improved fall recovery and global strengthening.  ? ?The following exercises were performed on floor mat for improved ankle/HS stretch, BLE strength and lateral weight shifting:  ?-Tall to half kneel w/forward lean x6 each side, x2 w/BUE support and x4 without. Added HS stretch after forward lean, x45s hold  ?-Mini squats, x15. Pt suddenly reported headache and dizziness, provided min A to sit on floor mat and instructed throughout diaphragmatic breathing. Vitals not assessed per patient request.  ? ?Final floor > mat transfer w/S*, noted good BLE sequencing.  ? ? ?PATIENT EDUCATION: ?Education details: Continuing HEP  ?Person educated: Patient ?Education method: Explanation and Demonstration  ?Education comprehension: verbalized understanding and needs further education ? ? ?HOME EXERCISE PROGRAM: updated on 4/24 ?Access Code: [email or web address] ?URL: [email or web address] ?Date: 02/22/2022 ?Prepared by: Mickie Bail Nayquan Evinger ? ?Exercises ?- Proper Sit to Stand Technique  - 1 x daily -  7 x weekly - 3 sets - 10 reps ?- Seated Hamstring Stretch  - 1 x daily - 7 x weekly - 3 sets - 10 reps - 30 second hold ?- Seated Ankle Alphabet  - 1 x daily - 7 x weekly - 3 sets - 10 reps ?- Long Sitting Ankle Plantar Flexion with Resistance  - 1 x daily - 7 x weekly - 3 sets - 10 reps ?- Long Sitting Ankle Inversion with Resistance  - 1 x daily - 7 x weekly - 3 sets - 10 reps ?- Long Sitting Ankle Eversion with Resistance  - 1 x daily - 7 x weekly - 3 sets - 10 reps ?- Ankle Dorsiflexion with Resistance  - 1 x daily - 7 x weekly - 3 sets - 10 reps ?- Seated Toe Towel Scrunches  - 1 x daily - 7 x weekly - 3 sets - 10 reps ? ? ? ?SHORT TERM GOALS:   Target date: 03/29/2022 ? ?Pt will ascend/descend 12 6" steps using LRAD and no rail mod I w/step-to pattern to imitate home environment and improve independence   ?Baseline:  ?Goal status: INITIAL ? ?2.  Pt will ambulate 250' w/SPC and S* for improved functional mobility  ?Baseline: using LBQC ?Goal status: INITIAL ? ?3.  Pt will improve 5 x STS to less than or equal to 16 secon

## 2022-03-01 NOTE — Telephone Encounter (Signed)
Informed of results. Sent in vitamin D.  ? ?Rosemarie Ax, MD ?[facility] ?03/01/2022, 12:56 PM ? ?

## 2022-03-05 NOTE — Telephone Encounter (Signed)
Alice Williams, CMA  Alice Williams, CMA ?Dr. Raeford Razor wants patient to have Prolia instead of Evenity due to recent stroke. Can you verify benefits for Prolia instead? ?

## 2022-03-05 NOTE — Telephone Encounter (Signed)
Prolia VOB initiated via [email or web address] 

## 2022-03-08 ENCOUNTER — Other Ambulatory Visit: Payer: Self-pay | Admitting: Medical

## 2022-03-09 ENCOUNTER — Other Ambulatory Visit (HOSPITAL_BASED_OUTPATIENT_CLINIC_OR_DEPARTMENT_OTHER): Payer: Self-pay

## 2022-03-09 ENCOUNTER — Other Ambulatory Visit (HOSPITAL_COMMUNITY): Payer: Self-pay

## 2022-03-09 MED ORDER — TRAZODONE HCL 50 MG PO TABS
ORAL_TABLET | ORAL | 3 refills | Status: AC
  Filled 2022-03-09: qty 30, 30d supply, fill #0
  Filled 2022-07-25: qty 30, 30d supply, fill #1
  Filled 2022-09-16: qty 30, 30d supply, fill #2

## 2022-03-09 NOTE — Telephone Encounter (Signed)
Generally speaking driving is her decision in terms of when she feels comfortable.  Most people who are driving have weightbearing and have near full range of motion with no pain medicine on board

## 2022-03-12 ENCOUNTER — Other Ambulatory Visit (HOSPITAL_COMMUNITY): Payer: Self-pay

## 2022-03-12 ENCOUNTER — Encounter: Payer: No Typology Code available for payment source | Admitting: Occupational Therapy

## 2022-03-13 ENCOUNTER — Other Ambulatory Visit (HOSPITAL_COMMUNITY): Payer: Self-pay

## 2022-03-13 ENCOUNTER — Other Ambulatory Visit (HOSPITAL_BASED_OUTPATIENT_CLINIC_OR_DEPARTMENT_OTHER): Payer: Self-pay

## 2022-03-13 ENCOUNTER — Ambulatory Visit (INDEPENDENT_AMBULATORY_CARE_PROVIDER_SITE_OTHER): Payer: No Typology Code available for payment source | Admitting: Medical

## 2022-03-13 VITALS — BP 100/52 | HR 70 | Temp 97.5°F | Resp 18 | Ht 67.0 in | Wt 124.9 lb

## 2022-03-13 DIAGNOSIS — S82841D Displaced bimalleolar fracture of right lower leg, subsequent encounter for closed fracture with routine healing: Secondary | ICD-10-CM | POA: Diagnosis not present

## 2022-03-13 DIAGNOSIS — F411 Generalized anxiety disorder: Secondary | ICD-10-CM | POA: Diagnosis not present

## 2022-03-13 DIAGNOSIS — R768 Other specified abnormal immunological findings in serum: Secondary | ICD-10-CM

## 2022-03-13 DIAGNOSIS — Z8673 Personal history of transient ischemic attack (TIA), and cerebral infarction without residual deficits: Secondary | ICD-10-CM | POA: Diagnosis not present

## 2022-03-13 DIAGNOSIS — M2550 Pain in unspecified joint: Secondary | ICD-10-CM

## 2022-03-13 DIAGNOSIS — R350 Frequency of micturition: Secondary | ICD-10-CM

## 2022-03-13 DIAGNOSIS — N3941 Urge incontinence: Secondary | ICD-10-CM

## 2022-03-13 LAB — C-REACTIVE PROTEIN: CRP: <1 mg/dL (ref 0.5–20.0)

## 2022-03-13 LAB — URIC ACID: Uric Acid, Serum: 2.6 mg/dL (ref 2.4–7.0)

## 2022-03-13 LAB — SEDIMENTATION RATE: Sed Rate: 7 mm/hr (ref 0–30)

## 2022-03-13 MED ORDER — OXYBUTYNIN CHLORIDE ER 5 MG PO TB24
5.0000 mg | ORAL_TABLET | Freq: Every day | ORAL | 0 refills | Status: DC
  Filled 2022-03-13: qty 30, 30d supply, fill #0

## 2022-03-13 NOTE — Patient Instructions (Addendum)
Recent onset of various joint pains/arthralgias.  Among these are bilateral hand , right shoulder pain, both hips and right knee pain.  Will get inflammatory lab studies.  Patient will continue with orthopedist for right shoulder pain and will probably get injection to treat the bursitis. ? ?I do think you can start back on atorvastatin since has been week or more and there was no improvement in various joint pains when medication was stopped. ? ?Anxiety and mood stable.  Continue Lexapro. ? ?For history of stroke continue on aspirin 81 mg daily.  Follow-up with neurologist as scheduled. ? ?Recent described urge incontinence and frequent urination.  We will get a urine culture today and go ahead and prescribe Ditropan XL. ? ?Hopefully you will be able to return to work soon as you are right leg strength improves with physical therapy. ? ?Follow-up in 2 months or sooner if needed. ?

## 2022-03-13 NOTE — Progress Notes (Signed)
? ?Subjective:  ? ? Patient ID: Alice Williams, female    DOB: 1961/04/19, 61 y.o.   MRN: [id number] ? ?HPI ? ?Pt has hx of Ankle fracture and wrist fracture. Pt states she is going to PT. No longer using walker or cane.  ? ?Pt has some bilateral hand pain(5th digits hurt the worse and rt ring finger), rt shoulder pain, rt knee pain and both hips hurt.  ? ?Orthopedist thinks rt shoulder pain is from burstitis ? ?Pt stopped lipitor more than a week ago thinking was cause of joint pain. Now more than a week and stopping made no impact. ? ?For generalized anxiety and depressed mood continue with Lexapro. ? ?History of stroke. Pt no longer on plavix. Just on aspirin 81 mg daily per Dr. Jaynee Eagles. ? ?Pt does plan to return to work. Plans to return to work when PT determines able. Working on walking/gait presently. Pt feels like still building up strength of rt leg post ankle fx and recovery. ? ?Urge incontinence. Sometimes has to rush to bathroom or will have incontience over past 2 months. ? ?Review of Systems  ?Constitutional:  Negative for chills, fatigue and fever.  ?Respiratory:  Negative for cough, chest tightness, shortness of breath and wheezing.   ?Cardiovascular:  Negative for chest pain and palpitations.  ?Gastrointestinal:  Negative for abdominal pain and blood in stool.  ?Genitourinary:  Positive for frequency and urgency. Negative for decreased urine volume, dysuria and hematuria.  ?Musculoskeletal:  Positive for arthralgias. Negative for back pain, joint swelling and myalgias.  ?Neurological:  Negative for dizziness, speech difficulty, weakness, numbness and headaches.  ?Hematological:  Negative for adenopathy. Does not bruise/bleed easily.  ?Psychiatric/Behavioral:  Negative for behavioral problems and confusion.   ? ? ?Past Medical History:  ?Diagnosis Date  ? Anal fissure   ? Anemia   ? in early 20's  ? Anxiety   ? Arthritis   ? back  ? Breast calcifications on mammogram   ? Cancer ([place])   ? pre cancer  colon,cervix  ? Colon polyp   ? Dyslipidemia 06/24/2018  ? Elevated cholesterol   ? Facet arthropathy, lumbosacral 08/28/2017  ? Family history of adverse reaction to anesthesia   ? sibling has N/V  ? GERD (gastroesophageal reflux disease)   ? Heart murmur   ? History of colon polyps   ? History of kidney stones   ? has small ones  ? IBS (irritable bowel syndrome)   ? Inappropriate sinus tachycardia   ? Migraines   ? Osteopenia   ? Osteoporosis of femur without pathological fracture 08/28/2017  ? Palpitations   ? PONV (postoperative nausea and vomiting)   ? Premature surgical menopause   ? PTSD (post-traumatic stress disorder)   ? Sleep apnea   ? ?  ?Social History  ? ?Socioeconomic History  ? Marital status: Divorced  ?  Spouse name: Not on file  ? Number of children: 1  ? Years of education: Not on file  ? Highest education level: Master's degree (e.g., MA, MS, MEng, MEd, MSW, MBA)  ?Occupational History  ? Occupation: Therapist, sports  ?Tobacco Use  ? Smoking status: Former  ?  Types: Cigarettes  ?  Quit date: 08/09/2006  ?  Years since quitting: 15.6  ? Smokeless tobacco: Never  ?Vaping Use  ? Vaping Use: Never used  ?Substance and Sexual Activity  ? Alcohol use: Not Currently  ?  Comment: rare  ? Drug use: No  ? Sexual activity: Never  ?  Birth control/protection: Surgical  ?Other Topics Concern  ? Not on file  ?Social History Narrative  ? Single, livevs alone in a 2 story house. Drinks two sodas a day. Does not exercise regularly.  ? ?Social Determinants of Health  ? ?Financial Resource Strain: Not on file  ?Food Insecurity: Not on file  ?Transportation Needs: Not on file  ?Physical Activity: Not on file  ?Stress: Not on file  ?Social Connections: Not on file  ?Intimate Partner Violence: Not on file  ? ? ?Past Surgical History:  ?Procedure Laterality Date  ? ANAL FISSURE REPAIR    ? APPENDECTOMY  2000  ? COLONOSCOPY W/ POLYPECTOMY  03/2015  ? COLONOSCOPY WITH ESOPHAGOGASTRODUODENOSCOPY (EGD)  04/01/2015  ? KNEE SURGERY    ?  OOPHORECTOMY    ? OOPHORECTOMY    ? 1995, 1998  ? ORIF ANKLE FRACTURE Right 11/28/2021  ? Procedure: OPEN REDUCTION INTERNAL FIXATION (ORIF) ANKLE FRACTURE;  Surgeon: Meredith Pel, MD;  Location: [facility];  Service: Orthopedics;  Laterality: Right;  ? PELVIC LAPAROSCOPY    ? x 4 for endometriosis   ? VAGINAL HYSTERECTOMY  1994  ? ? ?Family History  ?Problem Relation Age of Onset  ? Colon cancer Mother   ? Breast cancer Mother 47  ?     and liver  ? Migraines Mother   ? Hyperlipidemia Father   ? Alcohol abuse Father   ? Heart attack Father   ? Cancer Father   ?     Head and neck   ? Kidney cancer Sister 78  ? Migraines Sister   ? Migraines Sister   ? Hyperlipidemia Brother   ? Lung cancer Maternal Grandmother   ? Parkinson's disease Maternal Grandfather   ? Dementia Maternal Grandfather   ? Prostate cancer Maternal Grandfather   ? Parkinson's disease Paternal Grandmother   ? Heart attack Paternal Grandfather   ? Esophageal cancer Neg Hx   ? Stroke Neg Hx   ? ? ?Allergies  ?Allergen Reactions  ? Sulfa Antibiotics Anaphylaxis  ? Sumatriptan Succinate Anaphylaxis  ? Emgality [Galcanezumab-Gnlm] Nausea Only  ?  GI upset - diarrhea, nausea  ? ? ?Current Outpatient Medications on File Prior to Visit  ?Medication Sig Dispense Refill  ? acetaminophen (TYLENOL) 500 MG tablet Take 500 mg by mouth every 6 (six) hours as needed for mild pain, fever or headache.    ? Atogepant 60 MG TABS Take 60 mg by mouth daily. 30 tablet 6  ? atorvastatin (LIPITOR) 40 MG tablet Take 1 tablet (40 mg total) by mouth daily. 90 tablet 1  ? BOTOX 200 units SOLR INJECT 200 UNITS AS DIRECTED EVERY 3 (THREE) MONTHS. 1 each 3  ? Botulinum Toxin Type A (BOTOX) 200 units SOLR Inject 200 Units as directed every 3 (three) months. 1 each 1  ? Calcium Carbonate-Vitamin D 600-400 MG-UNIT tablet Take 1 tablet by mouth 2 (two) times daily. 60 tablet 11  ? Coenzyme Q10 (CO Q 10 PO) Take 1 tablet by mouth daily.    ? escitalopram (LEXAPRO) 10 MG tablet TAKE 1  TABLET BY MOUTH ONCE DAILY 90 tablet 1  ? famotidine (PEPCID) 20 MG tablet Take 1 tablet (20 mg total) by mouth daily. 30 tablet 0  ? methocarbamol (ROBAXIN) 500 MG tablet Take 1 tablet (500 mg total) by mouth every 8 (eight) hours as needed. (Patient taking differently: Take 500 mg by mouth every 8 (eight) hours as needed for muscle spasms.) 30 tablet 0  ?  nadolol (CORGARD) 40 MG tablet Take 1 tablet (40 mg total) by mouth daily. 90 tablet 3  ? ondansetron (ZOFRAN) 4 MG tablet Take 1 tablet (4 mg total) by mouth every 8 (eight) hours as needed for nausea or vomiting. 20 tablet 0  ? oxyCODONE-acetaminophen (PERCOCET) 5-325 MG tablet Take 1 tablet by mouth every 4 (four) hours as needed for severe pain. 30 tablet 0  ? pantoprazole (PROTONIX) 40 MG tablet TAKE 1 TABLET BY MOUTH ONCE DAILY 30 tablet 6  ? polycarbophil (FIBERCON) 625 MG tablet Take 625 mg by mouth daily.    ? Riboflavin 400 MG CAPS Take 400 mg by mouth daily.    ? Topiramate ER (TROKENDI XR) 200 MG CP24 Take 1 capsule (200 mg) by mouth daily. 90 capsule 3  ? traZODone (DESYREL) 50 MG tablet TAKE 1/2 - 1 TABLET BY MOUTH EVERY NIGHT AT BEDTIME AS NEEDED FOR SLEEP 30 tablet 3  ? Vitamin D, Ergocalciferol, (DRISDOL) 1.25 MG (50000 UNIT) CAPS capsule Take 1 capsule (50,000 Units total) by mouth every 7 (seven) days. Take for 8 total doses(weeks) 8 capsule 0  ? ?No current facility-administered medications on file prior to visit.  ? ? ?BP (!) 100/52   Pulse 70   Temp (!) 97.5 ?F (36.4 ?C)   Resp 18   Ht '5\' 7"'$  (1.702 m)   Wt 124 lb 14.4 oz (56.7 kg)   SpO2 97%   BMI 19.56 kg/m?  ?  ?   ?Objective:  ? Physical Exam ? ?General ?Mental Status- Alert. General Appearance- Not in acute distress.  ? ?Skin ?General: Color- Normal Color. Moisture- Normal Moisture. ? ?Neck ?Carotid Arteries- Normal color. Moisture- Normal Moisture. No carotid bruits. No JVD. ? ?Chest and Lung Exam ?Auscultation: ?Breath Sounds:-Normal. ? ?Cardiovascular ?Auscultation:Rythm-  Regular. ?Murmurs & Other Heart Sounds:Auscultation of the heart reveals- No Murmurs. ? ?Abdomen ?Inspection:-Inspeection Normal. ?Palpation/Percussion:Note:No mass. Palpation and Percussion of the abdomen reveal- Non

## 2022-03-14 ENCOUNTER — Ambulatory Visit: Payer: No Typology Code available for payment source | Admitting: Occupational Therapy

## 2022-03-14 ENCOUNTER — Encounter: Payer: Self-pay | Admitting: Physical Therapy

## 2022-03-14 ENCOUNTER — Encounter: Payer: Self-pay | Admitting: Occupational Therapy

## 2022-03-14 ENCOUNTER — Encounter: Payer: No Typology Code available for payment source | Admitting: Occupational Therapy

## 2022-03-14 ENCOUNTER — Ambulatory Visit: Payer: No Typology Code available for payment source | Admitting: Physical Therapy

## 2022-03-14 DIAGNOSIS — M6281 Muscle weakness (generalized): Secondary | ICD-10-CM

## 2022-03-14 DIAGNOSIS — R2689 Other abnormalities of gait and mobility: Secondary | ICD-10-CM

## 2022-03-14 DIAGNOSIS — R2681 Unsteadiness on feet: Secondary | ICD-10-CM | POA: Diagnosis not present

## 2022-03-14 DIAGNOSIS — M25631 Stiffness of right wrist, not elsewhere classified: Secondary | ICD-10-CM

## 2022-03-14 DIAGNOSIS — I69354 Hemiplegia and hemiparesis following cerebral infarction affecting left non-dominant side: Secondary | ICD-10-CM

## 2022-03-14 LAB — URINE CULTURE
MICRO NUMBER:: 13402264
SPECIMEN QUALITY:: ADEQUATE

## 2022-03-14 NOTE — Therapy (Signed)
?OUTPATIENT PHYSICAL THERAPY TREATMENT NOTE ? ? ?Patient Name: Alice Williams ?MRN: [id number] ?DOB:Oct 09, 1961, 61 y.o., female ?Today's Date: 03/14/2022 ? ?PCP: Mackie Pai, PA-C ?REFERRING PROVIDER: Melvenia Beam, MD  ? ? PT End of Session - 03/14/22 0935   ? ? Visit Number 17   ? Number of Visits 25   ? Date for PT Re-Evaluation 03/14/22   ? Authorization Type [facility] Needs auth after 12th visit   ? Authorization Time Period 12 visits from 02/22/22-04/06/22   ? Authorization - Visit Number 4   ? Authorization - Number of Visits 12   ? PT Start Time 0930   ? Equipment Utilized During Treatment Gait belt   ? Activity Tolerance Patient tolerated treatment well   Orthostatic  ? Behavior During Therapy [facility] for tasks assessed/performed   ? ?  ?  ? ?  ? ? ? ? ? ? ? ? ? ? ? ? ? ? ?Past Medical History:  ?Diagnosis Date  ? Anal fissure   ? Anemia   ? in early 20's  ? Anxiety   ? Arthritis   ? back  ? Breast calcifications on mammogram   ? Cancer ([place])   ? pre cancer colon,cervix  ? Colon polyp   ? Dyslipidemia 06/24/2018  ? Elevated cholesterol   ? Facet arthropathy, lumbosacral 08/28/2017  ? Family history of adverse reaction to anesthesia   ? sibling has N/V  ? GERD (gastroesophageal reflux disease)   ? Heart murmur   ? History of colon polyps   ? History of kidney stones   ? has small ones  ? IBS (irritable bowel syndrome)   ? Inappropriate sinus tachycardia   ? Migraines   ? Osteopenia   ? Osteoporosis of femur without pathological fracture 08/28/2017  ? Palpitations   ? PONV (postoperative nausea and vomiting)   ? Premature surgical menopause   ? PTSD (post-traumatic stress disorder)   ? Sleep apnea   ? ?Past Surgical History:  ?Procedure Laterality Date  ? ANAL FISSURE REPAIR    ? APPENDECTOMY  2000  ? COLONOSCOPY W/ POLYPECTOMY  03/2015  ? COLONOSCOPY WITH ESOPHAGOGASTRODUODENOSCOPY (EGD)  04/01/2015  ? KNEE SURGERY    ? OOPHORECTOMY    ? OOPHORECTOMY    ? 1995, 1998  ? ORIF ANKLE FRACTURE Right 11/28/2021  ?  Procedure: OPEN REDUCTION INTERNAL FIXATION (ORIF) ANKLE FRACTURE;  Surgeon: Meredith Pel, MD;  Location: [facility];  Service: Orthopedics;  Laterality: Right;  ? PELVIC LAPAROSCOPY    ? x 4 for endometriosis   ? VAGINAL HYSTERECTOMY  1994  ? ?Patient Active Problem List  ? Diagnosis Date Noted  ? Migraine with aura and without status migrainosus, not intractable 02/23/2022  ? Acute right MCA stroke ([place]) 12/07/2021  ? GERD without esophagitis 12/07/2021  ? Right wrist fracture, with routine healing, subsequent encounter 12/07/2021  ? Bimalleolar ankle fracture, right, closed, with routine healing, subsequent encounter   ? Blepharospasm 11/08/2021  ? Sleep disorder, circadian, shift work type 08/01/2021  ? Excessive daytime sleepiness 08/01/2021  ? Morning headache 02/16/2021  ? Left eye pain 02/16/2021  ? Intractable episodic cluster headache 02/16/2021  ? Behavioral insomnia of childhood 02/16/2021  ? Retrognathia 02/16/2021  ? Chronic migraine without aura, with intractable migraine, so stated, with status migrainosus 01/12/2021  ? Muscle spasm 10/14/2020  ? Vitamin D deficiency 08/15/2020  ? Acute bilateral low back pain with bilateral sciatica 07/13/2020  ? Gastric polyp 02/11/2019  ? Sessile  colonic polyp 02/11/2019  ? Chronic fatigue 02/11/2019  ? Grade I internal hemorrhoids 02/11/2019  ? Ductal hyperplasia of breast 08/21/2018  ? Cognitive changes 06/24/2018  ? Dyslipidemia 06/24/2018  ? Dense breast tissue on mammogram 04/17/2018  ? Fibrocystic breast determined by biopsy 04/09/2018  ? Family history of breast cancer in mother 04/09/2018  ? Paroxysmal sinus tachycardia ([place]) 03/27/2018  ? Osteoporosis 08/28/2017  ? Facet arthropathy, lumbosacral 08/28/2017  ? Chronic left SI joint pain 08/27/2017  ? Left lumbar radiculopathy 08/27/2017  ? Allergic rhinitis 08/09/2017  ? Disorder of lipoid metabolism 08/09/2017  ? Insomnia secondary to anxiety 08/09/2017  ? Postmenopausal osteoporosis 08/09/2017  ?  History of hysterectomy for benign disease 08/09/2017  ? GAD (generalized anxiety disorder) 08/09/2017  ? Former smoker, stopped smoking in distant past 08/09/2017  ? Minor depressive disorder 08/09/2017  ? Palpitations 03/16/2015  ? Family history of colon cancer 03/02/2015  ? Hiatal hernia with GERD without esophagitis 03/02/2015  ? History of adenomatous polyp of colon 03/02/2015  ? Chronic migraine without aura 10/10/2014  ? History of cardiovascular disorder 01/18/2003  ? ? ?REFERRING DIAG: R53.1 (ICD-10-CM) - Left-sided weakness R41.4 (ICD-10-CM) - Left-sided neglect I63.411 (ICD-10-CM) - Cerebrovascular accident (CVA) due to embolism of right middle cerebral artery (HCC) I63.9,I77.70 (ICD-10-CM) - Cerebral ischemic stroke due to arterial dissection (HCC)  ? ?THERAPY DIAG:  ?Hemiplegia and hemiparesis following cerebral infarction affecting left non-dominant side ([place]) ? ?Muscle weakness (generalized) ? ?Unsteadiness on feet ? ?Other abnormalities of gait and mobility ? ?PERTINENT HISTORY: SVT, migraine headaches, PTSD, anxiety, irritable bowel syndrome, GERD, osteoporosis, sleep apnea, left sciatica.  ? ?PRECAUTIONS: Fall, L hemi, Full weightbearing R wrist, WBAT RLE  ? ?SUBJECTIVE: Pt reports she has gone up and down stairs x2 since last visit, one of these bouts was yesterday.  One flight of stairs was the set she fell down when wearing clogs several visits ago.  She did not fall either time this time, using unilateral rail both times.  She has walked up and down a gravel hill and has been driving.  She reports no falls or other issues. ? ?PAIN:  ?Are you having pain? Yes ?Pain Location: R shoulder (bursitis) and R wrist  ?Pain severity: 8/10 -when moving arm ? ?OBJECTIVE ? ?DIAGNOSTIC FINDINGS: MRI and CTA performed on 02/20/22: ICA compared to 12/14/2021 and 12/07/2021 CTA studies. Although they were apparently not compared at the time, there was already some improvement between the two February studies.  There is no longer any stenosis.  ? ?Right MCA occlusion seen on 12/07/2021 is no longer present. ? ?TODAY'S TREATMENT:  ? ?There Ex   ?-forward and backward monster walks 10'x6 in open gym + x6 10' in // bars w/ LUE grazing left bar; CGA in open gym vs independent w/ UE support, advised pt to perform at countertop w/ red theraband ?-eccentric forward step downs 2x8 using right rail only (per son's home setup), cued for decreased ROM when in R stance to dec assist from minA to SBA>full step downs using Right rail and SBA 2x8 each LE ?Elliptical level 1.0-2.0 for 8 minutes total; 2 mins at 1.0 forward + 2 mins at 2.0 forward + 2 mins at 1.0 backwards + 2 mins at 1.0 forward;  w/BUE support for BLE coordination, strength and improved ankle DF ROM. Pt demos improved sagittal plane ROM throughout. ? ?PATIENT EDUCATION: ?Education details: Additions to HEP.  ?Person educated: Patient ?Education method: Explanation and Demonstration  ?Education comprehension: verbalized  understanding and needs further education ? ? ?HOME EXERCISE PROGRAM: updated on 4/24 ?Access Code: [email or web address] ?URL: [email or web address] ?Date: 02/22/2022 ?Prepared by: Mickie Bail Plaster ? ?Exercises ?- Proper Sit to Stand Technique  - 1 x daily - 7 x weekly - 3 sets - 10 reps ?- Seated Hamstring Stretch  - 1 x daily - 7 x weekly - 3 sets - 10 reps - 30 second hold ?- Seated Ankle Alphabet  - 1 x daily - 7 x weekly - 3 sets - 10 reps ?- Long Sitting Ankle Plantar Flexion with Resistance  - 1 x daily - 7 x weekly - 3 sets - 10 reps ?- Long Sitting Ankle Inversion with Resistance  - 1 x daily - 7 x weekly - 3 sets - 10 reps ?- Long Sitting Ankle Eversion with Resistance  - 1 x daily - 7 x weekly - 3 sets - 10 reps ?- Ankle Dorsiflexion with Resistance  - 1 x daily - 7 x weekly - 3 sets - 10 reps ?- Seated Toe Towel Scrunches  - 1 x daily - 7 x weekly - 3 sets - 10 reps ? ? ? ?SHORT TERM GOALS:   Target date: 04/11/2022 ? ?Pt will ascend/descend 12  6" steps using LRAD and no rail mod I w/step-to pattern to imitate home environment and improve independence  ?Baseline:  ?Goal status: INITIAL ? ?2.  Pt will ambulate 250' w/SPC and S* for improved function

## 2022-03-14 NOTE — Therapy (Signed)
[facility] ?[place], [place], [address] ?Phone: [phone]   Fax:  [phone] ? ?Occupational Therapy Treatment ? ?Patient Details  ?Name: Alice Williams ?MRN: [id number] ?Date of Birth: [birth year] ?Referring Provider (OT): Dr. Sarina Ill ? ? ?Encounter Date: 03/14/2022 ? ? OT End of Session - 03/14/22 1108   ? ? Visit Number 15   ? Number of Visits 25   ? Date for OT Re-Evaluation 03/25/22   ? Authorization Type Focus 2023  copay $40 per visit  No Ded , VL: [place]  Auth Reqd: after 12th visit.  Additional 10 visits approved per LL.   ? Authorization - Visit Number 14   corrected count  ? Authorization - Number of Visits 22   ? OT Start Time 1105   ? OT Stop Time 1148   ? OT Time Calculation (min) 43 min   ? Activity Tolerance Patient tolerated treatment well   ? Behavior During Therapy [facility] for tasks assessed/performed   ? ?  ?  ? ?  ? ? ?Past Medical History:  ?Diagnosis Date  ? Anal fissure   ? Anemia   ? in early 20's  ? Anxiety   ? Arthritis   ? back  ? Breast calcifications on mammogram   ? Cancer ([place])   ? pre cancer colon,cervix  ? Colon polyp   ? Dyslipidemia 06/24/2018  ? Elevated cholesterol   ? Facet arthropathy, lumbosacral 08/28/2017  ? Family history of adverse reaction to anesthesia   ? sibling has N/V  ? GERD (gastroesophageal reflux disease)   ? Heart murmur   ? History of colon polyps   ? History of kidney stones   ? has small ones  ? IBS (irritable bowel syndrome)   ? Inappropriate sinus tachycardia   ? Migraines   ? Osteopenia   ? Osteoporosis of femur without pathological fracture 08/28/2017  ? Palpitations   ? PONV (postoperative nausea and vomiting)   ? Premature surgical menopause   ? PTSD (post-traumatic stress disorder)   ? Sleep apnea   ? ? ?Past Surgical History:  ?Procedure Laterality Date  ? ANAL FISSURE REPAIR    ? APPENDECTOMY  2000  ? COLONOSCOPY W/ POLYPECTOMY  03/2015  ? COLONOSCOPY WITH  ESOPHAGOGASTRODUODENOSCOPY (EGD)  04/01/2015  ? KNEE SURGERY    ? OOPHORECTOMY    ? OOPHORECTOMY    ? 1995, 1998  ? ORIF ANKLE FRACTURE Right 11/28/2021  ? Procedure: OPEN REDUCTION INTERNAL FIXATION (ORIF) ANKLE FRACTURE;  Surgeon: Meredith Pel, MD;  Location: [facility];  Service: Orthopedics;  Laterality: Right;  ? PELVIC LAPAROSCOPY    ? x 4 for endometriosis   ? VAGINAL HYSTERECTOMY  1994  ? ? ?There were no vitals filed for this visit. ? ?SUBJECTIVE:  ? I get an injection next Wednesday. I also started driving ? ?PAIN:  ?Are you having pain? Yes ?NPRS scale: 8/10 at beginning of session, 1/10 at end of session ?Pain location: Rt shoulder ?Pain orientation: Right  ?PAIN TYPE: sharp and pulling ?Pain description: intermittent  ?Aggravating factors: with movement ?Relieving factors: taping, proper positioning, wt bearing ? ? ? ?TREATMENT:  ? ?Ultrasound x 8 min. To Rt shoulder at anterior sh along bicep attachment and middle deltoid - 3 Mhz, 1.0 wts/cm2, 100% continuous ? ?Kinesiotaping to relax biceps and middle deltoid. Reviewed wear and care and instructed to take off next Monday.  ? ?Prone, scapular retractions with min cueing.  Wt.  Bearing on elbows with head/chest lift for scapular depression. ? ?Light wt. Bearing through bilateral hands on edge of mat with arms in abduction and slight extension and ER w/ chest lift for scapula depression. Modifications for wrist to alleviate wrist pain ? ?UBE x 5 mintues, level 1 for UE strength/endurance ? ?Pain reduced in Rt shoulder from 8/10 to 1/10 at end of session ? ? ? ? OT Short Term Goals - 02/15/22 1111   ? ?  ? OT SHORT TERM GOAL #1  ? Title Pt will be independent with initial HEP for strength/activity tolerance.--check STGs 01/23/22   ? Time 4   ? Period Weeks   ? Status Achieved   ?  ? OT SHORT TERM GOAL #2  ? Title Pt will be independent with initial HEP for R wrist ROM (once cleared by MD).   ? Time 4   ? Period Weeks   ? Status Achieved   ?  ? OT SHORT  TERM GOAL #3  ? Title Pt will perform BADLs mod I.   ? Time 4   ? Period Weeks   ? Status Achieved   ?  ? OT SHORT TERM GOAL #4  ? Title Pt will perform simple home maintenance/snack prep with supervision.   ? Time 4   ? Period Weeks   ? Status Achieved   ? ?  ?  ? ?  ? ? ? ? OT Long Term Goals - 02/27/22 1209   ? ?  ? OT LONG TERM GOAL #1  ? Title Pt will be independent with updated strengthening HEP for bilateral UEs/R wrist (once cleared by MD).--check LTGs 03/25/22   ? Time 12   ? Period Weeks   ? Status On-going   ?  ? OT LONG TERM GOAL #2  ? Title Pt will perform simple clooking and cleaning tasks mod I.   ? Time 12   ? Period Weeks   ? Status On-going   02/27/22:  needs assist/difficulty for heavier pots/pans, chopping, high reaching  ?  ? OT LONG TERM GOAL #3  ? Title Pt will be able to complete IADL task for at least 6mn prior to rest break.   ? Time 12   ? Period Weeks   ? Status On-going   02/15/22:  10-270m  ?  ? OT LONG TERM GOAL #4  ? Title Pt will demo at least 35lbs R grip strength for lifting/carrying objects and opening containers.   ? Time 12   ? Period Weeks   ? Status On-going   01/29/22: Rt = 19.1 lbs, 02/22/22: Rt = 25.3 lbs  (Lt = 44.7 lbs)  ?  ? OT LONG TERM GOAL #5  ? Title Pt will demo at least 60* wrist flex/ext for ADLs/IADLs.   ? Time 12   ? Period Weeks   ? Status On-going   flex = 65*, ext = 60*; 02/22/22: flex = 70*, ext = 55*  ? ?  ?  ? ?  ? ? ? ? ? ? ? ? Plan - 03/14/22 1156   ? ? Clinical Impression Statement Pt is progressing towards goals with improving R shoulder pain and activity tolerance. Pt responds well to ultrasound and kinesiotape   ? OT Occupational Profile and History Detailed Assessment- Review of Records and additional review of physical, cognitive, psychosocial history related to current functional performance   ? Occupational performance deficits (Please refer to evaluation for details): ADL's;IADL's;Work;Leisure   ?  Body Structure / Function / Physical Skills  ADL;Decreased knowledge of use of DME;Strength;Balance;UE functional use;ROM;IADL;Endurance;Mobility   ? Rehab Potential Good   ? Clinical Decision Making Several treatment options, min-mod task modification necessary   ? Comorbidities Affecting Occupational Performance: May have comorbidities impacting occupational performance   ? Modification or Assistance to Complete Evaluation  No modification of tasks or assist necessary to complete eval   ? OT Frequency 2x / week   ? OT Duration 12 weeks   +eval, may modify based on scheduling needs/when precautions are lifted  ? OT Treatment/Interventions Self-care/ADL training;Moist Heat;Fluidtherapy;DME and/or AE instruction;Splinting;Therapeutic activities;Balance training;Contrast Bath;Ultrasound;Therapeutic exercise;Passive range of motion;Functional Mobility Training;Neuromuscular education;Cryotherapy;Electrical Stimulation;Paraffin;Manual Therapy;Patient/family education;Energy conservation   ? Plan continue to address Rt shoulder pain, Rt wrist/hand strengthening, dynamic standing balance   ? Consulted and Agree with Plan of Care Patient   ? ?  ?  ? ?  ? ? ? ?Patient will benefit from skilled therapeutic intervention in order to improve the following deficits and impairments:   ?Body Structure / Function / Physical Skills: ADL, Decreased knowledge of use of DME, Strength, Balance, UE functional use, ROM, IADL, Endurance, Mobility ?  ?  ? ? ?Visit Diagnosis: ?Hemiplegia and hemiparesis following cerebral infarction affecting left non-dominant side ([place]) ? ?Muscle weakness (generalized) ? ?Stiffness of right wrist, not elsewhere classified ? ? ? ?Problem List ?Patient Active Problem List  ? Diagnosis Date Noted  ? Migraine with aura and without status migrainosus, not intractable 02/23/2022  ? Acute right MCA stroke ([place]) 12/07/2021  ? GERD without esophagitis 12/07/2021  ? Right wrist fracture, with routine healing, subsequent encounter 12/07/2021  ? Bimalleolar ankle  fracture, right, closed, with routine healing, subsequent encounter   ? Blepharospasm 11/08/2021  ? Sleep disorder, circadian, shift work type 08/01/2021  ? Excessive daytime sleepiness 08/01/2021  ? Morning headache 02/16/2021  ? [place] Comment

## 2022-03-15 ENCOUNTER — Other Ambulatory Visit (HOSPITAL_COMMUNITY): Payer: Self-pay

## 2022-03-16 LAB — RHEUMATOID FACTOR: Rheumatoid fact SerPl-aCnc: <14 IU/mL (ref <14)

## 2022-03-16 LAB — ANTI-NUCLEAR AB-TITER (ANA TITER): ANA Titer 1: 1:80 {titer} — ABNORMAL HIGH

## 2022-03-16 LAB — ANA: Anti Nuclear Antibody (ANA): POSITIVE — AB

## 2022-03-17 NOTE — Addendum Note (Signed)
Addended by: Anabel Halon on: 03/17/2022 08:47 AM   Modules accepted: Orders

## 2022-03-17 NOTE — Telephone Encounter (Signed)
Prior auth required for PROLIA  PA PROCESS DETAILS: PA is required and is currently not on file. Please call Medical Review at [phone] to initiate PA.  

## 2022-03-17 NOTE — Telephone Encounter (Signed)
[employer] advised that They will not release benefit information to a third party. Please contact the payer directly at [phone] to obtain benefits and prior authorization requirements for your patient.

## 2022-03-17 NOTE — Telephone Encounter (Signed)
PA APPROVED CoverMyMeds KEY: [id number] Valid 05/13/21-05/12/22

## 2022-03-17 NOTE — Telephone Encounter (Signed)
PHARMACY BENEFIT Alice Williams, please send Rx - approved via pharmacy benefits, NOT approved through buy and bill)  Pt ready for scheduling on or after 03/17/22  Out-of-pocket cost due at time of visit: $0 Estimated OOP Cost via Pharmacy with Copay Card: $25  Primary: [place] Focus Plan Prolia co-insurance:  Admin fee co-insurance:   Secondary: n/a Prolia co-insurance:  Admin fee co-insurance:   Deductible: does not apply  Prior Auth: APPROVED PA# [id number] Valid: 05/13/21-05/12/22    ** This summary of benefits is an estimation of the patient's out-of-pocket cost. Exact cost may very based on individual plan coverage.

## 2022-03-19 ENCOUNTER — Other Ambulatory Visit (HOSPITAL_BASED_OUTPATIENT_CLINIC_OR_DEPARTMENT_OTHER): Payer: Self-pay

## 2022-03-19 MED ORDER — DENOSUMAB 60 MG/ML ~~LOC~~ SOSY
60.0000 mg | PREFILLED_SYRINGE | SUBCUTANEOUS | 1 refills | Status: AC
  Filled 2022-03-19: qty 1, 180d supply, fill #0
  Filled 2022-09-16: qty 1, 180d supply, fill #1

## 2022-03-19 NOTE — Addendum Note (Signed)
Addended by: Cresenciano Lick on: 03/19/2022 01:31 PM   Modules accepted: Orders

## 2022-03-19 NOTE — Addendum Note (Signed)
Addended by: Cresenciano Lick on: 03/19/2022 12:14 PM   Modules accepted: Orders

## 2022-03-19 NOTE — Telephone Encounter (Signed)
Pt informed of below.  

## 2022-03-19 NOTE — Telephone Encounter (Signed)
Prolia Rx sent to [facility] pharmacy. See meds. Left message for pt to call me back so I can inform of below.

## 2022-03-20 ENCOUNTER — Encounter: Payer: Self-pay | Admitting: Occupational Therapy

## 2022-03-20 ENCOUNTER — Ambulatory Visit: Payer: No Typology Code available for payment source | Admitting: Occupational Therapy

## 2022-03-20 ENCOUNTER — Ambulatory Visit (INDEPENDENT_AMBULATORY_CARE_PROVIDER_SITE_OTHER): Payer: No Typology Code available for payment source | Admitting: *Deleted

## 2022-03-20 DIAGNOSIS — M6281 Muscle weakness (generalized): Secondary | ICD-10-CM

## 2022-03-20 DIAGNOSIS — M8080XG Other osteoporosis with current pathological fracture, unspecified site, subsequent encounter for fracture with delayed healing: Secondary | ICD-10-CM | POA: Diagnosis not present

## 2022-03-20 DIAGNOSIS — M25631 Stiffness of right wrist, not elsewhere classified: Secondary | ICD-10-CM

## 2022-03-20 DIAGNOSIS — R2681 Unsteadiness on feet: Secondary | ICD-10-CM | POA: Diagnosis not present

## 2022-03-20 NOTE — Progress Notes (Signed)
Patient here for her prolia injection. Patient received '60mg'$ / mL X 1 injection SQ in left arm. She tolerated injection well. She will return in 6 months for her next Prolia.

## 2022-03-20 NOTE — Therapy (Signed)
[facility] [address] [place], [place], [address] Phone: [phone]   Fax:  [phone]  Occupational Therapy Treatment  Patient Details  Name: Alice Williams MRN: [id number] Date of Birth: 01-25-1961 Referring Provider (OT): Dr. Sarina Ill   Encounter Date: 03/20/2022   OT End of Session - 03/20/22 1322     Visit Number 16    Number of Visits 25    Date for OT Re-Evaluation 03/25/22    Authorization Type Focus 2023  copay $40 per visit  No Ded , VL: [place]  Auth Reqd: after 12th visit.  Additional 10 visits approved per LL.    Authorization - Visit Number 15   corrected count   Authorization - Number of Visits 22    OT Start Time 9244    OT Stop Time 1400    OT Time Calculation (min) 45 min    Activity Tolerance Patient tolerated treatment well    Behavior During Therapy WFL for tasks assessed/performed             Past Medical History:  Diagnosis Date   Anal fissure    Anemia    in early 20's   Anxiety    Arthritis    back   Breast calcifications on mammogram    Cancer ([place])    pre cancer colon,cervix   Colon polyp    Dyslipidemia 06/24/2018   Elevated cholesterol    Facet arthropathy, lumbosacral 08/28/2017   Family history of adverse reaction to anesthesia    sibling has N/V   GERD (gastroesophageal reflux disease)    Heart murmur    History of colon polyps    History of kidney stones    has small ones   IBS (irritable bowel syndrome)    Inappropriate sinus tachycardia    Migraines    Osteopenia    Osteoporosis of femur without pathological fracture 08/28/2017   Palpitations    PONV (postoperative nausea and vomiting)    Premature surgical menopause    PTSD (post-traumatic stress disorder)    Sleep apnea     Past Surgical History:  Procedure Laterality Date   ANAL FISSURE REPAIR     APPENDECTOMY  2000   COLONOSCOPY W/ POLYPECTOMY  03/2015   COLONOSCOPY WITH  ESOPHAGOGASTRODUODENOSCOPY (EGD)  04/01/2015   KNEE SURGERY     OOPHORECTOMY     OOPHORECTOMY     1995, 1998   ORIF ANKLE FRACTURE Right 11/28/2021   Procedure: OPEN REDUCTION INTERNAL FIXATION (ORIF) ANKLE FRACTURE;  Surgeon: Meredith Pel, MD;  Location: [facility];  Service: Orthopedics;  Laterality: Right;   PELVIC LAPAROSCOPY     x 4 for endometriosis    VAGINAL HYSTERECTOMY  1994    There were no vitals filed for this visit.  SUBJECTIVE:   I get my shoulder injection tomorrow. My ANA was high and now they are referring me to a rheumatologist  PAIN:  Are you having pain? Yes NPRS scale: 5/10 at beginning of session,  Pain location: Rt shoulder Pain orientation: Right  PAIN TYPE: sharp and pulling Pain description: constant Aggravating factors: with movement Relieving factors: taping, proper positioning, wt bearing    TREATMENT:   Waiting on re-taping until after steroid injection to Rt shoulder.   Prayer stretch x 3 reps, holding 10 sec.  Wrist flexion and extension each x 10 reps with 3 lb weight.   Reviewed progress to date - see LTG's. Discussed possible d/c end of next  week as POC ends vs. renewal. Pt is seeing rheumatology later on (has not been scheduled yet) and discussed possible return to therapy in a few months once we know what underlying medical conditions may be.  Grip Rt = 25.1 lbs  Discussed potential problems w/ return to living alone and problem solved certain tasks including: fall prevention w/ sweeping, vacuuming, transporting dirty and clean laundry on different floors from laundry room, etc.    OT Short Term Goals - 02/15/22 1111       OT SHORT TERM GOAL #1   Title Pt will be independent with initial HEP for strength/activity tolerance.--check STGs 01/23/22    Time 4    Period Weeks    Status Achieved      OT SHORT TERM GOAL #2   Title Pt will be independent with initial HEP for R wrist ROM (once cleared by MD).    Time 4    Period Weeks     Status Achieved      OT SHORT TERM GOAL #3   Title Pt will perform BADLs mod I.    Time 4    Period Weeks    Status Achieved      OT SHORT TERM GOAL #4   Title Pt will perform simple home maintenance/snack prep with supervision.    Time 4    Period Weeks    Status Achieved               OT Long Term Goals - 02/27/22 1209       OT LONG TERM GOAL #1   Title Pt will be independent with updated strengthening HEP for bilateral UEs/R wrist (once cleared by MD).--check LTGs 03/25/22    Time 12    Period Weeks    Status MET     OT LONG TERM GOAL #2   Title Pt will perform simple clooking and cleaning tasks mod I.    Time 12    Period Weeks    Status MET except vacuuming     OT LONG TERM GOAL #3   Title Pt will be able to complete IADL task for at least 58mn prior to rest break.    Time 12    Period Weeks    Status On-going   02/15/22:  10-265m     OT LONG TERM GOAL #4   Title Pt will demo at least 35lbs R grip strength for lifting/carrying objects and opening containers.    Time 12    Period Weeks    Status On-going   01/29/22: Rt = 19.1 lbs, 02/22/22: Rt = 25.3 lbs  (Lt = 44.7 lbs)     OT LONG TERM GOAL #5   Title Pt will demo at least 60* wrist flex/ext for ADLs/IADLs.    Time 12    Period Weeks    Status MET  flex = 65*, ext = 60*                  Plan - 03/20/22 1430     Clinical Impression Statement Pt progressing towards remaining goals. However grip strength remains the same as when last assessed.    OT Occupational Profile and History Detailed Assessment- Review of Records and additional review of physical, cognitive, psychosocial history related to current functional performance    Occupational performance deficits (Please refer to evaluation for details): ADL's;IADL's;Work;Leisure    Body Structure / Function / Physical Skills ADL;Decreased knowledge of use of DME;Strength;Balance;UE functional use;ROM;IADL;Endurance;Mobility  Rehab Potential  Good    OT Frequency 2x / week    OT Duration 12 weeks    OT Treatment/Interventions Self-care/ADL training;Moist Heat;Fluidtherapy;DME and/or AE instruction;Splinting;Therapeutic activities;Balance training;Contrast Bath;Ultrasound;Therapeutic exercise;Passive range of motion;Functional Mobility Training;Neuromuscular education;Cryotherapy;Electrical Stimulation;Paraffin;Manual Therapy;Patient/family education;Energy conservation    Plan address remaining goals, possible d/c next week or renewal if needed    Consulted and Agree with Plan of Care Patient               Patient will benefit from skilled therapeutic intervention in order to improve the following deficits and impairments:   Body Structure / Function / Physical Skills: ADL, Decreased knowledge of use of DME, Strength, Balance, UE functional use, ROM, IADL, Endurance, Mobility       Visit Diagnosis: Stiffness of right wrist, not elsewhere classified  Unsteadiness on feet  Muscle weakness (generalized)    Problem List Patient Active Problem List   Diagnosis Date Noted   Migraine with aura and without status migrainosus, not intractable 02/23/2022   Acute right MCA stroke ([place]) 12/07/2021   GERD without esophagitis 12/07/2021   Right wrist fracture, with routine healing, subsequent encounter 12/07/2021   Bimalleolar ankle fracture, right, closed, with routine healing, subsequent encounter    Blepharospasm 11/08/2021   Sleep disorder, circadian, shift work type 08/01/2021   Excessive daytime sleepiness 08/01/2021   Morning headache 02/16/2021   Left eye pain 02/16/2021   Intractable episodic cluster headache 02/16/2021   Behavioral insomnia of childhood 02/16/2021   Retrognathia 02/16/2021   Chronic migraine without aura, with intractable migraine, so stated, with status migrainosus 01/12/2021   Muscle spasm 10/14/2020   Vitamin D deficiency 08/15/2020   Acute bilateral low back pain with bilateral sciatica  07/13/2020   Gastric polyp 02/11/2019   Sessile colonic polyp 02/11/2019   Chronic fatigue 02/11/2019   Grade I internal hemorrhoids 02/11/2019   Ductal hyperplasia of breast 08/21/2018   Cognitive changes 06/24/2018   Dyslipidemia 06/24/2018   Dense breast tissue on mammogram 04/17/2018   Fibrocystic breast determined by biopsy 04/09/2018   Family history of breast cancer in mother 04/09/2018   Paroxysmal sinus tachycardia ([place]) 03/27/2018   Osteoporosis 08/28/2017   Facet arthropathy, lumbosacral 08/28/2017   Chronic left SI joint pain 08/27/2017   Left lumbar radiculopathy 08/27/2017   Allergic rhinitis 08/09/2017   Disorder of lipoid metabolism 08/09/2017   Insomnia secondary to anxiety 08/09/2017   Postmenopausal osteoporosis 08/09/2017   History of hysterectomy for benign disease 08/09/2017   GAD (generalized anxiety disorder) 08/09/2017   Former smoker, stopped smoking in distant past 08/09/2017   Minor depressive disorder 08/09/2017   Palpitations 03/16/2015   Family history of colon cancer 03/02/2015   Hiatal hernia with GERD without esophagitis 03/02/2015   History of adenomatous polyp of colon 03/02/2015   Chronic migraine without aura 10/10/2014   History of cardiovascular disorder 01/18/2003    Hans Eden, OT 03/20/2022, 2:31 PM  [facility] [address] [place], [place], [address] Phone: [phone]   Fax:  [phone]

## 2022-03-21 ENCOUNTER — Telehealth: Payer: Self-pay | Admitting: Orthopedic Surgery

## 2022-03-21 ENCOUNTER — Ambulatory Visit: Payer: Self-pay

## 2022-03-21 ENCOUNTER — Telehealth: Payer: Self-pay

## 2022-03-21 ENCOUNTER — Encounter: Payer: Self-pay | Admitting: Orthopedic Surgery

## 2022-03-21 ENCOUNTER — Ambulatory Visit (INDEPENDENT_AMBULATORY_CARE_PROVIDER_SITE_OTHER): Payer: No Typology Code available for payment source | Admitting: Orthopedic Surgery

## 2022-03-21 DIAGNOSIS — M7501 Adhesive capsulitis of right shoulder: Secondary | ICD-10-CM | POA: Diagnosis not present

## 2022-03-21 DIAGNOSIS — M25511 Pain in right shoulder: Secondary | ICD-10-CM

## 2022-03-21 MED ORDER — LIDOCAINE HCL 1 % IJ SOLN
5.0000 mL | INTRAMUSCULAR | Status: AC | PRN
  Administered 2022-03-21: 5 mL

## 2022-03-21 MED ORDER — BUPIVACAINE HCL 0.5 % IJ SOLN
9.0000 mL | INTRAMUSCULAR | Status: AC | PRN
  Administered 2022-03-21: 9 mL via INTRA_ARTICULAR

## 2022-03-21 MED ORDER — METHYLPREDNISOLONE ACETATE 40 MG/ML IJ SUSP
40.0000 mg | INTRAMUSCULAR | Status: AC | PRN
  Administered 2022-03-21: 40 mg via INTRA_ARTICULAR

## 2022-03-21 NOTE — Telephone Encounter (Signed)
Please advise patients work status and provide note. Thanks. [employer] forms received.

## 2022-03-21 NOTE — Telephone Encounter (Signed)
Please advise 

## 2022-03-21 NOTE — Progress Notes (Signed)
Office Visit Note   Patient: Alice Williams           Date of Birth: [birth year]           MRN: [id number] Visit Date: 03/21/2022 Requested by: Alice Pai, PA-C [place],  [place] [id number] PCP: Alice Williams  Subjective: Chief Complaint  Patient presents with   Right Shoulder - Pain    HPI: Alice Williams is a 61 year old female is doing well from right ankle fracture fixation.  She is able to ambulate.  She has had some weight loss.  Postop course complicated by stroke.  Does report constant pain in the right shoulder with decreased range of motion.  She has been going to physical therapy 2 times a week for her ankle.  Pain occurs more at night with her shoulder.              ROS: All systems reviewed are negative as they relate to the chief complaint within the history of present illness.  Patient denies  fevers or chills.   Assessment & Plan: Visit Diagnoses:  1. Right shoulder pain, unspecified chronicity     Plan: Impression is right frozen shoulder.  Plan radiographs in 6 weeks.  Intra-articular injection performed today.  Passive range of motion with occupational therapy for right frozen shoulder.  6-week return for clinical recheck and decision for or against repeat injection.  Follow-Up Instructions: No follow-ups on file.   Orders:  Orders Placed This Encounter  Procedures   XR Shoulder Right   No orders of the defined types were placed in this encounter.     Procedures: Large Joint Inj: R glenohumeral on 03/21/2022 12:10 PM Indications: diagnostic evaluation and pain Details: 18 G 1.5 in needle, posterior approach  Arthrogram: No  Medications: 9 mL bupivacaine 0.5 %; 40 mg methylPREDNISolone acetate 40 MG/ML; 5 mL lidocaine 1 % Outcome: tolerated well, no immediate complications Procedure, treatment alternatives, risks and benefits explained, specific risks discussed. Consent was given by the patient. Immediately prior to  procedure a time out was called to verify the correct patient, procedure, equipment, support staff and site/side marked as required. Patient was prepped and draped in the usual sterile fashion.      Clinical Data: No additional findings.  Objective: Vital Signs: There were no vitals taken for this visit.  Physical Exam:   Constitutional: Patient appears well-developed HEENT:  Head: Normocephalic Eyes:EOM are normal Neck: Normal range of motion Cardiovascular: Normal rate Pulmonary/chest: Effort normal Neurologic: Patient is alert Skin: Skin is warm Psychiatric: Patient has normal mood and affect   Ortho Exam: Ortho exam demonstrates range of motion on the right of 40/60/100.  On the left she has 70/100/180.  Rotator cuff strength is good to infraspinatus supraspinatus subscap muscle testing.  There is sensory function of the hand is intact.  Specialty Comments:  No specialty comments available.  Imaging: No results found.   PMFS History: Patient Active Problem List   Diagnosis Date Noted   Migraine with aura and without status migrainosus, not intractable 02/23/2022   Acute right MCA stroke ([facility]) 12/07/2021   GERD without esophagitis 12/07/2021   Right wrist fracture, with routine healing, subsequent encounter 12/07/2021   Bimalleolar ankle fracture, right, closed, with routine healing, subsequent encounter    Blepharospasm 11/08/2021   Sleep disorder, circadian, shift work type 08/01/2021   Excessive daytime sleepiness 08/01/2021   Morning headache 02/16/2021   Left eye pain 02/16/2021  Intractable episodic cluster headache 02/16/2021   Behavioral insomnia of childhood 02/16/2021   Retrognathia 02/16/2021   Chronic migraine without aura, with intractable migraine, so stated, with status migrainosus 01/12/2021   Muscle spasm 10/14/2020   Vitamin D deficiency 08/15/2020   Acute bilateral low back pain with bilateral sciatica 07/13/2020   Gastric polyp 02/11/2019    Sessile colonic polyp 02/11/2019   Chronic fatigue 02/11/2019   Grade I internal hemorrhoids 02/11/2019   Ductal hyperplasia of breast 08/21/2018   Cognitive changes 06/24/2018   Dyslipidemia 06/24/2018   Dense breast tissue on mammogram 04/17/2018   Fibrocystic breast determined by biopsy 04/09/2018   Family history of breast cancer in mother 04/09/2018   Paroxysmal sinus tachycardia ([facility]) 03/27/2018   Osteoporosis 08/28/2017   Facet arthropathy, lumbosacral 08/28/2017   Chronic left SI joint pain 08/27/2017   Left lumbar radiculopathy 08/27/2017   Allergic rhinitis 08/09/2017   Disorder of lipoid metabolism 08/09/2017   Insomnia secondary to anxiety 08/09/2017   Postmenopausal osteoporosis 08/09/2017   History of hysterectomy for benign disease 08/09/2017   GAD (generalized anxiety disorder) 08/09/2017   Former smoker, stopped smoking in distant past 08/09/2017   Minor depressive disorder 08/09/2017   Palpitations 03/16/2015   Family history of colon cancer 03/02/2015   Hiatal hernia with GERD without esophagitis 03/02/2015   History of adenomatous polyp of colon 03/02/2015   Chronic migraine without aura 10/10/2014   History of cardiovascular disorder 01/18/2003   Past Medical History:  Diagnosis Date   Anal fissure    Anemia    in early 20's   Anxiety    Arthritis    back   Breast calcifications on mammogram    Cancer ([place])    pre cancer colon,cervix   Colon polyp    Dyslipidemia 06/24/2018   Elevated cholesterol    Facet arthropathy, lumbosacral 08/28/2017   Family history of adverse reaction to anesthesia    sibling has N/V   GERD (gastroesophageal reflux disease)    Heart murmur    History of colon polyps    History of kidney stones    has small ones   IBS (irritable bowel syndrome)    Inappropriate sinus tachycardia    Migraines    Osteopenia    Osteoporosis of femur without pathological fracture 08/28/2017   Palpitations    PONV (postoperative  nausea and vomiting)    Premature surgical menopause    PTSD (post-traumatic stress disorder)    Sleep apnea     Family History  Problem Relation Age of Onset   Colon cancer Mother    Breast cancer Mother 65       and liver   Migraines Mother    Hyperlipidemia Father    Alcohol abuse Father    Heart attack Father    Cancer Father        Head and neck    Kidney cancer Sister 78   Migraines Sister    Migraines Sister    Hyperlipidemia Brother    Lung cancer Maternal Grandmother    Parkinson's disease Maternal Grandfather    Dementia Maternal Grandfather    Prostate cancer Maternal Grandfather    Parkinson's disease Paternal Grandmother    Heart attack Paternal Grandfather    Esophageal cancer Neg Hx    Stroke Neg Hx     Past Surgical History:  Procedure Laterality Date   ANAL FISSURE REPAIR     APPENDECTOMY  2000   COLONOSCOPY W/ POLYPECTOMY  03/2015  COLONOSCOPY WITH ESOPHAGOGASTRODUODENOSCOPY (EGD)  04/01/2015   KNEE SURGERY     OOPHORECTOMY     OOPHORECTOMY     1995, 1998   ORIF ANKLE FRACTURE Right 11/28/2021   Procedure: OPEN REDUCTION INTERNAL FIXATION (ORIF) ANKLE FRACTURE;  Surgeon: Meredith Pel, MD;  Location: [facility];  Service: Orthopedics;  Laterality: Right;   PELVIC LAPAROSCOPY     x 4 for endometriosis    VAGINAL HYSTERECTOMY  1994   Social History   Occupational History   Occupation: Therapist, sports  Tobacco Use   Smoking status: Former    Types: Cigarettes    Quit date: 08/09/2006    Years since quitting: 15.6   Smokeless tobacco: Never  Vaping Use   Vaping Use: Never used  Substance and Sexual Activity   Alcohol use: Not Currently    Comment: rare   Drug use: No   Sexual activity: Never    Birth control/protection: Surgical

## 2022-03-21 NOTE — Telephone Encounter (Signed)
Out of work for 6 more weeks can you fax note..  Thanks clinic note pending dictation

## 2022-03-21 NOTE — Telephone Encounter (Signed)
Dr.Dean saw this pt today. Can you please work on PROM exercises right shoulder

## 2022-03-22 ENCOUNTER — Encounter: Payer: Self-pay | Admitting: Occupational Therapy

## 2022-03-22 ENCOUNTER — Ambulatory Visit: Payer: No Typology Code available for payment source | Admitting: Occupational Therapy

## 2022-03-22 ENCOUNTER — Telehealth: Payer: Self-pay | Admitting: Orthopedic Surgery

## 2022-03-22 ENCOUNTER — Ambulatory Visit: Payer: No Typology Code available for payment source | Admitting: Physical Therapy

## 2022-03-22 DIAGNOSIS — M25511 Pain in right shoulder: Secondary | ICD-10-CM

## 2022-03-22 DIAGNOSIS — M25631 Stiffness of right wrist, not elsewhere classified: Secondary | ICD-10-CM

## 2022-03-22 DIAGNOSIS — M6281 Muscle weakness (generalized): Secondary | ICD-10-CM

## 2022-03-22 DIAGNOSIS — R2681 Unsteadiness on feet: Secondary | ICD-10-CM

## 2022-03-22 DIAGNOSIS — M25611 Stiffness of right shoulder, not elsewhere classified: Secondary | ICD-10-CM

## 2022-03-22 DIAGNOSIS — R2689 Other abnormalities of gait and mobility: Secondary | ICD-10-CM

## 2022-03-22 NOTE — Telephone Encounter (Signed)
Okay for active assisted range of motion and passive range of motion for frozen shoulder.  That would be ideal.

## 2022-03-22 NOTE — Telephone Encounter (Signed)
Work note in chart

## 2022-03-22 NOTE — Therapy (Signed)
OUTPATIENT PHYSICAL THERAPY TREATMENT NOTE- RECERT   Patient Name: Alice Williams MRN: [id number] DOB:[birth year], 61 y.o., female Today's Date: 03/22/2022  PCP: Mackie Pai, PA-C REFERRING PROVIDER: Melvenia Beam, MD    PT End of Session - 03/22/22 0851     Visit Number 18    Number of Visits 25    Date for PT Re-Evaluation 21/30/86   Recert   Authorization Type [facility] Needs auth after 12th visit    Authorization Time Period 12 visits from 02/22/22-04/06/22    Authorization - Visit Number 4    Authorization - Number of Visits 12    PT Start Time 0848    PT Stop Time 0930    PT Time Calculation (min) 42 min    Equipment Utilized During Treatment --    Activity Tolerance Patient tolerated treatment well   Orthostatic   Behavior During Therapy [facility] for tasks assessed/performed                         Past Medical History:  Diagnosis Date   Anal fissure    Anemia    in early 20's   Anxiety    Arthritis    back   Breast calcifications on mammogram    Cancer ([place])    pre cancer colon,cervix   Colon polyp    Dyslipidemia 06/24/2018   Elevated cholesterol    Facet arthropathy, lumbosacral 08/28/2017   Family history of adverse reaction to anesthesia    sibling has N/V   GERD (gastroesophageal reflux disease)    Heart murmur    History of colon polyps    History of kidney stones    has small ones   IBS (irritable bowel syndrome)    Inappropriate sinus tachycardia    Migraines    Osteopenia    Osteoporosis of femur without pathological fracture 08/28/2017   Palpitations    PONV (postoperative nausea and vomiting)    Premature surgical menopause    PTSD (post-traumatic stress disorder)    Sleep apnea    Past Surgical History:  Procedure Laterality Date   ANAL FISSURE REPAIR     APPENDECTOMY  2000   COLONOSCOPY W/ POLYPECTOMY  03/2015   COLONOSCOPY WITH ESOPHAGOGASTRODUODENOSCOPY (EGD)  04/01/2015   KNEE SURGERY     OOPHORECTOMY      OOPHORECTOMY     1995, 1998   ORIF ANKLE FRACTURE Right 11/28/2021   Procedure: OPEN REDUCTION INTERNAL FIXATION (ORIF) ANKLE FRACTURE;  Surgeon: Meredith Pel, MD;  Location: [facility];  Service: Orthopedics;  Laterality: Right;   PELVIC LAPAROSCOPY     x 4 for endometriosis    VAGINAL HYSTERECTOMY  1994   Patient Active Problem List   Diagnosis Date Noted   Migraine with aura and without status migrainosus, not intractable 02/23/2022   Acute right MCA stroke ([place]) 12/07/2021   GERD without esophagitis 12/07/2021   Right wrist fracture, with routine healing, subsequent encounter 12/07/2021   Bimalleolar ankle fracture, right, closed, with routine healing, subsequent encounter    Blepharospasm 11/08/2021   Sleep disorder, circadian, shift work type 08/01/2021   Excessive daytime sleepiness 08/01/2021   Morning headache 02/16/2021   Left eye pain 02/16/2021   Intractable episodic cluster headache 02/16/2021   Behavioral insomnia of childhood 02/16/2021   Retrognathia 02/16/2021   Chronic migraine without aura, with intractable migraine, so stated, with status migrainosus 01/12/2021   Muscle spasm 10/14/2020   Vitamin D deficiency 08/15/2020  Acute bilateral low back pain with bilateral sciatica 07/13/2020   Gastric polyp 02/11/2019   Sessile colonic polyp 02/11/2019   Chronic fatigue 02/11/2019   Grade I internal hemorrhoids 02/11/2019   Ductal hyperplasia of breast 08/21/2018   Cognitive changes 06/24/2018   Dyslipidemia 06/24/2018   Dense breast tissue on mammogram 04/17/2018   Fibrocystic breast determined by biopsy 04/09/2018   Family history of breast cancer in mother 04/09/2018   Paroxysmal sinus tachycardia ([place]) 03/27/2018   Osteoporosis 08/28/2017   Facet arthropathy, lumbosacral 08/28/2017   Chronic left SI joint pain 08/27/2017   Left lumbar radiculopathy 08/27/2017   Allergic rhinitis 08/09/2017   Disorder of lipoid metabolism 08/09/2017   Insomnia secondary  to anxiety 08/09/2017   Postmenopausal osteoporosis 08/09/2017   History of hysterectomy for benign disease 08/09/2017   GAD (generalized anxiety disorder) 08/09/2017   Former smoker, stopped smoking in distant past 08/09/2017   Minor depressive disorder 08/09/2017   Palpitations 03/16/2015   Family history of colon cancer 03/02/2015   Hiatal hernia with GERD without esophagitis 03/02/2015   History of adenomatous polyp of colon 03/02/2015   Chronic migraine without aura 10/10/2014   History of cardiovascular disorder 01/18/2003    REFERRING DIAG: R53.1 (ICD-10-CM) - Left-sided weakness R41.4 (ICD-10-CM) - Left-sided neglect I63.411 (ICD-10-CM) - Cerebrovascular accident (CVA) due to embolism of right middle cerebral artery ([place]) I63.9,I77.70 (ICD-10-CM) - Cerebral ischemic stroke due to arterial dissection (H[facility])   THERAPY DIAG:  Other abnormalities of gait and mobility - Plan: PT plan of care cert/re-cert  Muscle weakness (generalized) - Plan: PT plan of care cert/re-cert  PERTINENT HISTORY: SVT, migraine headaches, PTSD, anxiety, irritable bowel syndrome, GERD, osteoporosis, sleep apnea, left sciatica.   PRECAUTIONS: Fall, L hemi, Full weightbearing R wrist, WBAT RLE   SUBJECTIVE: Pt reports her MD stated she needs 6 more weeks of PT for "pt safety". States she walked into [place] from parking deck by herself yesterday w/no issues. MD recommending pt start w/indirect care for return to work. Pt inquiring about whether that is appropriate. Received steroid injection to R shoulder, plan to do MRI in 2 weeks    PAIN:  Are you having pain? Yes Pain Location: R shoulder and R wrist  Pain severity: 4-5/10 -when moving arm  OBJECTIVE  DIAGNOSTIC FINDINGS: MRI and CTA performed on 02/20/22: ICA compared to 12/14/2021 and 12/07/2021 CTA studies. Although they were apparently not compared at the time, there was already some improvement between the two February studies. There is no longer  any stenosis.   Right MCA occlusion seen on 12/07/2021 is no longer present.  TODAY'S TREATMENT:  Ther Act  STG assessment   Gait pattern: step through pattern, decreased arm swing- Left, decreased step length- Left, decreased stance time- Left, decreased hip/knee flexion- Left, decreased ankle dorsiflexion- Left, Left foot flat, lateral hip instability, wide BOS, and poor foot clearance- Left Distance walked: 66' and various clinic distances  Assistive device utilized: None Level of assistance: Modified independence Comments: Pt demonstrated continued decreased step clearance of LLE but no LOB noted. Increased cadence and step length throughout.   STAIRS:  Level of Assistance: SBA Stair Negotiation Technique: Step to Pattern with No Rails  Number of Stairs: 12   Height of Stairs: 6"  Comments: Pt demonstrates instability while descending (catching LLE on step), states she now has a rail on L side in her home so LTG updated to reflect handrail change    [facility] PT Assessment - 03/22/22 0911  Transfers   Five time sit to stand comments  12.37s without UE support      Ambulation/Gait   Gait velocity 32.8' over 9s = 3.64 ft/s without AD      Timed Up and Go Test   Normal TUG (seconds) 9.38   without AD            Ther Ex  Elliptical level 2 for 4 minutes (2 min fwd and 2 retro) for improved ankle ROM, BLE strength and cardiovascular conditioning. Provided S* while pt getting onto/off of elliptical.   PATIENT EDUCATION: Education details: Goal assessment, educated pt on current mobility and having to make LTGs more difficult due to improvement in function. Plan to DC on 6/15, pt verbalized understanding and agreement  Person educated: Patient Education method: Explanation and Demonstration  Education comprehension: verbalized understanding and needs further education   HOME EXERCISE PROGRAM: updated on 4/24 Access Code: [email or web address] URL:  [email or web address] Date: 02/22/2022 Prepared by: Mickie Bail Terelle Dobler  Exercises - Proper Sit to Stand Technique  - 1 x daily - 7 x weekly - 3 sets - 10 reps - Seated Hamstring Stretch  - 1 x daily - 7 x weekly - 3 sets - 10 reps - 30 second hold - Seated Ankle Alphabet  - 1 x daily - 7 x weekly - 3 sets - 10 reps - Long Sitting Ankle Plantar Flexion with Resistance  - 1 x daily - 7 x weekly - 3 sets - 10 reps - Long Sitting Ankle Inversion with Resistance  - 1 x daily - 7 x weekly - 3 sets - 10 reps - Long Sitting Ankle Eversion with Resistance  - 1 x daily - 7 x weekly - 3 sets - 10 reps - Ankle Dorsiflexion with Resistance  - 1 x daily - 7 x weekly - 3 sets - 10 reps - Seated Toe Towel Scrunches  - 1 x daily - 7 x weekly - 3 sets - 10 reps    SHORT TERM GOALS:   Target date: 03/12/2022  Pt will ascend/descend 12 6" steps using LRAD and no rail mod I w/step-to pattern to imitate home environment and improve independence  Baseline: pt ascended/descended 12 steps w/step-to pattern and no rails w/S*, mild instability descending  Goal status: NOT MET  2.  Pt will ambulate 250' w/SPC and S* for improved functional mobility  Baseline: using LBQC; 345' without AD mod I  Goal status: MET  3.  Pt will improve 5 x STS to less than or equal to 16 seconds without BUE support to demonstrate improved functional strength and transfer efficiency.   Baseline: 16.75s w/BUE; 12.37s without UE support  Goal status: MET  4.  Pt will improve normal TUG to less than or equal to 15 seconds w/LRAD for improved functional mobility and decreased fall risk.  Baseline: 21.54s w/LBQC; 9.38s without AD  Goal status: MET   LONG TERM GOALS:  Target date: 04/09/2022   Pt will improve Berg score to 50/56 for decreased fall risk  Baseline: 41/56 on 4/17 Goal status: INITIAL  2.  Pt will improve gait velocity to at least 3.8 ft/s with LRAD for improved gait efficiency and performance at limited  community ambulator level   Baseline: 1.4 ft/s w/LBQC; 3.64 ft/s without AD on 5/25 Goal status: REVISED  3.  Pt will be independent with final HEP for improved strength, balance, transfers and gait.  Baseline:  Goal status: INITIAL  4.  Pt will ascend/descend 12  steps without AD and left rail using step-to pattern mod I to imitate home environment  Baseline: step-to w/LBQC Goal status: REVISED  5.  Pt will ambulate 500' without AD mod I for improved functional mobility and independence  Baseline: using LBQC w/S*; 56' no AD on 5/25 mod I  Goal status: REVISED  6. Pt will improve 5 x STS to less than or equal to 11 seconds with BUE support to demonstrate improved functional strength and transfer efficiency.  Baseline: 16.75s with BUE support; 12.37s without UE support on 5/25   Goal Status: REVISED       Plan - 01/18/22 1230     Clinical Impression Statement Emphasis of skilled PT session on STG assessment. Pt has met 3 of 4 STGs, not meeting her stair goal due to mild instability while descending, requiring S*. Pt has significantly improved her gait speed, balance and BLE strength, LTGs updated to reflect improvement in function. Will send recert through June today to continue 2x/wk but plan to DC on June 15. Pt in agreement with plan.    Personal Factors and Comorbidities Comorbidity 3+;Profession    Comorbidities see above    Examination-Activity Limitations Bathing;Bed Mobility;Dressing;Locomotion Level;Sit;Squat;Stairs;Stand;Transfers    Examination-Participation Restrictions Community Activity;Driving;Occupation;Meal Prep;Shop;Laundry    Stability/Clinical Decision Making Unstable/Unpredictable    Rehab Potential Good    PT Frequency 2x / week    PT Duration 16 weeks (recert)   PT Treatment/Interventions ADLs/Self Care Home Management;Aquatic Therapy;DME Instruction;Electrical Stimulation;Gait training;Stair training;Functional mobility training;Therapeutic  activities;Therapeutic exercise;Balance training;Neuromuscular re-education;Cognitive remediation;Patient/family education;Manual techniques;Dry needling;Vestibular;Passive range of motion    PT Next Visit Plan Practice lifting and farmer's carries - pt worried about carrying groceries into house and bending down to pick up things from her car if she moves back to her house. Stair training-step ups forwards and sideways, treadmill/elliptical training, BLE strengthening-target gluts and quads, sit <>stands, squats, obstacle navigation   PT Home Exercise Plan [email or web address]    Consulted and Agree with Plan of Care Patient                Cruzita Lederer Nikitas Davtyan, PT, DPT 03/22/2022, 9:45 AM

## 2022-03-22 NOTE — Therapy (Signed)
[facility] [address] [place], [place], [address] Phone: [phone]   Fax:  [phone]  Occupational Therapy Treatment/RENEWAL  Patient Details  Name: Alice Williams MRN: [id number] Date of Birth: 1961-10-27 Referring Provider (OT): Dr. Sarina Ill   Encounter Date: 03/22/2022   OT End of Session - 03/22/22 1029     Visit Number 17    Number of Visits 25    Date for OT Re-Evaluation 04/25/22    Authorization Type Focus 2023  copay $40 per visit  No Ded , VL: [place]  Auth Reqd: after 12th visit.  Additional 10 visits approved per LL.    Authorization - Visit Number 16   corrected count   Authorization - Number of Visits 19    OT Start Time 1020    OT Stop Time 1115    OT Time Calculation (min) 55 min    Activity Tolerance Patient tolerated treatment well    Behavior During Therapy WFL for tasks assessed/performed             Past Medical History:  Diagnosis Date   Anal fissure    Anemia    in early 20's   Anxiety    Arthritis    back   Breast calcifications on mammogram    Cancer ([place])    pre cancer colon,cervix   Colon polyp    Dyslipidemia 06/24/2018   Elevated cholesterol    Facet arthropathy, lumbosacral 08/28/2017   Family history of adverse reaction to anesthesia    sibling has N/V   GERD (gastroesophageal reflux disease)    Heart murmur    History of colon polyps    History of kidney stones    has small ones   IBS (irritable bowel syndrome)    Inappropriate sinus tachycardia    Migraines    Osteopenia    Osteoporosis of femur without pathological fracture 08/28/2017   Palpitations    PONV (postoperative nausea and vomiting)    Premature surgical menopause    PTSD (post-traumatic stress disorder)    Sleep apnea     Past Surgical History:  Procedure Laterality Date   ANAL FISSURE REPAIR     APPENDECTOMY  2000   COLONOSCOPY W/ POLYPECTOMY  03/2015   COLONOSCOPY WITH  ESOPHAGOGASTRODUODENOSCOPY (EGD)  04/01/2015   KNEE SURGERY     OOPHORECTOMY     OOPHORECTOMY     1995, 1998   ORIF ANKLE FRACTURE Right 11/28/2021   Procedure: OPEN REDUCTION INTERNAL FIXATION (ORIF) ANKLE FRACTURE;  Surgeon: Meredith Pel, MD;  Location: [facility];  Service: Orthopedics;  Laterality: Right;   PELVIC LAPAROSCOPY     x 4 for endometriosis    VAGINAL HYSTERECTOMY  1994    There were no vitals filed for this visit.  SUBJECTIVE:   I got my shoulder injection yesterday. The ortho doctor thinks it's frozen shoulder now.   PAIN:  Are you having pain? Yes NPRS scale: 1/10 at rest, 5/10 with movement Pain location: Rt shoulder Pain orientation: Right  PAIN TYPE:  pulling Pain description: constant Aggravating factors: movement Relieving factors: proper positioning, wt bearing    TREATMENT:   Discussed renewal w/ O.T. to focus treatment more solely on the Rt shoulder pain and ROM as this has declined in the last couple weeks. Have added 2 new goals for renewal period.   A/ROM RT shoulder as follows (seated):  Flex = 110* Abd = 80*  ER approx 75% IR approx 50%  P/ROM Rt shoulder as follows (seated):  Flex = 130* Abd = 110*  Pt issued additional shoulder HEP today (see pt instructions and below). Pt also instructed to continue HEP from 02/22/22. Modifications prn for pain.   PATIENT EDUCATION: Education details: Updated/additional HEP for Rt shoulder Person educated: Patient Education method: Consulting civil engineer, Demonstration, Verbal cues, and Handouts Education comprehension: verbalized understanding, returned demonstration, verbal cues required, and needs further education  HOME EXERCISE PROGRAM: 02/22/22: Shoulder HEP 03/22/22: Additional HEP     OT Short Term Goals - all STG's have been met      OT SHORT TERM GOAL #1   Title Pt will be independent with initial HEP for strength/activity tolerance.--check STGs 01/23/22    Time 4    Period Weeks    Status  Achieved      OT SHORT TERM GOAL #2   Title Pt will be independent with initial HEP for R wrist ROM (once cleared by MD).    Time 4    Period Weeks    Status Achieved      OT SHORT TERM GOAL #3   Title Pt will perform BADLs mod I.    Time 4    Period Weeks    Status Achieved      OT SHORT TERM GOAL #4   Title Pt will perform simple home maintenance/snack prep with supervision.    Time 4    Period Weeks    Status Achieved               OT Long Term Goals - new and ongoing LTG's due 04/25/22      OT LONG TERM GOAL #1   Title Pt will be independent with updated strengthening HEP for bilateral UEs/R wrist (once cleared by MD).--check LTGs 03/25/22    Time 12    Period Weeks    Status MET     OT LONG TERM GOAL #2   Title Pt will perform simple clooking and cleaning tasks mod I.    Time 12    Period Weeks    Status MET except vacuuming     OT LONG TERM GOAL #3   Title Pt will be able to complete IADL task for at least 41mn prior to rest break.    Time 12    Period Weeks    Status MET     OT LONG TERM GOAL #4   Title Pt will demo at least 35lbs R grip strength for lifting/carrying objects and opening containers.    Time 12    Period Weeks    Status On-going   01/29/22: Rt = 19.1 lbs, 02/22/22: Rt = 25.3 lbs  (Lt = 44.7 lbs)     OT LONG TERM GOAL #5   Title Pt will demo at least 60* wrist flex/ext for ADLs/IADLs.    Time 12    Period Weeks    Status MET  flex = 65*, ext = 60*           NEW LTG's for renewal period:   1) Pt will report pain 3/10 or under with mid to higher level reaching  Baseline: 5-10/10  Status: new  2) Pt will demo Rt shoulder flexion to 125* or greater to retrieve light weight objects from high shelf, and shoulder abduction to 100* or greater  Baseline: flex = 110*, abd = 80*  Status: new       Plan - 03/20/22 1430     Clinical Impression Statement Pt  with renewal today to focus more on Rt shoulder pain and continue with  ongoing goal for Rt grip strength.   OT Occupational Profile and History Detailed Assessment- Review of Records and additional review of physical, cognitive, psychosocial history related to current functional performance    Occupational performance deficits (Please refer to evaluation for details): ADL's;IADL's;Work;Leisure    Body Structure / Function / Physical Skills ADL;Decreased knowledge of use of DME;Strength;Balance;UE functional use;ROM;IADL;Endurance;Mobility    Rehab Potential Good    OT Frequency 2x / week    OT Duration 4 more weeks    OT Treatment/Interventions Self-care/ADL training;Moist Heat;Fluidtherapy;DME and/or AE instruction;Splinting;Therapeutic activities;Balance training;Contrast Bath;Ultrasound;Therapeutic exercise;Passive range of motion;Functional Mobility Training;Neuromuscular education;Cryotherapy;Electrical Stimulation;Paraffin;Manual Therapy;Patient/family education;Energy conservation    Plan Renewal today for 2x/wk for 4 more weeks, continue sh ROM    Consulted and Agree with Plan of Care Patient               Patient will benefit from skilled therapeutic intervention in order to improve the following deficits and impairments:           Visit Diagnosis: Acute pain of right shoulder  Stiffness of right shoulder, not elsewhere classified  Stiffness of right wrist, not elsewhere classified  Muscle weakness (generalized)  Unsteadiness on feet    Problem List Patient Active Problem List   Diagnosis Date Noted   Migraine with aura and without status migrainosus, not intractable 02/23/2022   Acute right MCA stroke ([place]) 12/07/2021   GERD without esophagitis 12/07/2021   Right wrist fracture, with routine healing, subsequent encounter 12/07/2021   Bimalleolar ankle fracture, right, closed, with routine healing, subsequent encounter    Blepharospasm 11/08/2021   Sleep disorder, circadian, shift work type 08/01/2021   Excessive daytime  sleepiness 08/01/2021   Morning headache 02/16/2021   Left eye pain 02/16/2021   Intractable episodic cluster headache 02/16/2021   Behavioral insomnia of childhood 02/16/2021   Retrognathia 02/16/2021   Chronic migraine without aura, with intractable migraine, so stated, with status migrainosus 01/12/2021   Muscle spasm 10/14/2020   Vitamin D deficiency 08/15/2020   Acute bilateral low back pain with bilateral sciatica 07/13/2020   Gastric polyp 02/11/2019   Sessile colonic polyp 02/11/2019   Chronic fatigue 02/11/2019   Grade I internal hemorrhoids 02/11/2019   Ductal hyperplasia of breast 08/21/2018   Cognitive changes 06/24/2018   Dyslipidemia 06/24/2018   Dense breast tissue on mammogram 04/17/2018   Fibrocystic breast determined by biopsy 04/09/2018   Family history of breast cancer in mother 04/09/2018   Paroxysmal sinus tachycardia ([place]) 03/27/2018   Osteoporosis 08/28/2017   Facet arthropathy, lumbosacral 08/28/2017   Chronic left SI joint pain 08/27/2017   Left lumbar radiculopathy 08/27/2017   Allergic rhinitis 08/09/2017   Disorder of lipoid metabolism 08/09/2017   Insomnia secondary to anxiety 08/09/2017   Postmenopausal osteoporosis 08/09/2017   History of hysterectomy for benign disease 08/09/2017   GAD (generalized anxiety disorder) 08/09/2017   Former smoker, stopped smoking in distant past 08/09/2017   Minor depressive disorder 08/09/2017   Palpitations 03/16/2015   Family history of colon cancer 03/02/2015   Hiatal hernia with GERD without esophagitis 03/02/2015   History of adenomatous polyp of colon 03/02/2015   Chronic migraine without aura 10/10/2014   History of cardiovascular disorder 01/18/2003    Hans Eden, OT 03/22/2022, 11:55 AM  [facility] [address] [place], [place], [address] Phone: [phone]   Fax:  [phone]

## 2022-03-22 NOTE — Patient Instructions (Signed)
Flexion (Assistive)    LAYING DOWN: Do this exercise first thing in the morning before getting out of bed, and then again at night once you get back in bed. Clasp Rt wrist with Lt hand (thumb side up) and raise arms above head, keeping elbows as straight as possible. Repeat _10___ times. Do _2-3___ sessions per day.  SHOULDER: Flexion Bilateral    Laying down (first thing in the am, and then when getting back in bed in pm), hold paper towel roll palms facing, Raise arms overhead at same speed. Keep elbows straight. Repeat _10__ reps per set, _2__ sets per day  THEN can do above exercise sitting up to eye level x 10 reps    Shoulder External / Internal Rotation, Elbows Bent    Laying down, with elbows bent at 90 and close to body, palms UP holding cane, push forearms side to side rotating at shoulders (keeping upper arms close to body). Then move hands back to start, keeping elbows tucked. Repeat sequence _10___ times per session. Do __2__ sessions per day. Hand Variation: Palms down  Position: Standing    ROM: External Rotation - Wand (Supine)    Lie on back holding wand with elbows bent to 90. Rotate forearms over head as far as possible, and then to belly button keeping upper arms still.  Repeat __10__ times per set. Do _2___ sets per session.    Chest / Shoulder Stretch: Yardstick Along Spine    Hold stick horizontally against buttocks, hands shoulder width apart. Bend elbows, and raise stick along spine. Hold 1 count. Slowly return to starting position. Repeat __10__ times. Do 2 sessions per day

## 2022-03-22 NOTE — Telephone Encounter (Signed)
03/22/22 oow note faxed to [facility]

## 2022-03-28 ENCOUNTER — Ambulatory Visit: Payer: No Typology Code available for payment source | Admitting: Occupational Therapy

## 2022-03-28 ENCOUNTER — Encounter: Payer: Self-pay | Admitting: Occupational Therapy

## 2022-03-28 ENCOUNTER — Ambulatory Visit: Payer: No Typology Code available for payment source | Admitting: Physical Therapy

## 2022-03-28 ENCOUNTER — Other Ambulatory Visit (HOSPITAL_COMMUNITY): Payer: Self-pay

## 2022-03-28 DIAGNOSIS — R2689 Other abnormalities of gait and mobility: Secondary | ICD-10-CM

## 2022-03-28 DIAGNOSIS — M25611 Stiffness of right shoulder, not elsewhere classified: Secondary | ICD-10-CM

## 2022-03-28 DIAGNOSIS — M6281 Muscle weakness (generalized): Secondary | ICD-10-CM

## 2022-03-28 DIAGNOSIS — R2681 Unsteadiness on feet: Secondary | ICD-10-CM | POA: Diagnosis not present

## 2022-03-28 DIAGNOSIS — M25511 Pain in right shoulder: Secondary | ICD-10-CM

## 2022-03-28 NOTE — Therapy (Signed)
OUTPATIENT PHYSICAL THERAPY TREATMENT NOTE   Patient Name: Alice Williams MRN: [id number] DOB:Jan 26, 1961, 61 y.o., female Today's Date: 03/28/2022  PCP: Mackie Pai, PA-C REFERRING PROVIDER: Melvenia Beam, MD    PT End of Session - 03/28/22 1148     Visit Number 19    Number of Visits 25    Date for PT Re-Evaluation 88/91/69   Recert   Authorization Type Focus ([facility]) Needs auth after 12th visit    Authorization Time Period 12 visits from 02/22/22-04/06/22    Authorization - Visit Number 4    Authorization - Number of Visits 12    PT Start Time 1147    PT Stop Time 1227    PT Time Calculation (min) 40 min    Activity Tolerance Patient tolerated treatment well   Orthostatic   Behavior During Therapy [facility] for tasks assessed/performed                         Past Medical History:  Diagnosis Date   Anal fissure    Anemia    in early 20's   Anxiety    Arthritis    back   Breast calcifications on mammogram    Cancer ([place])    pre cancer colon,cervix   Colon polyp    Dyslipidemia 06/24/2018   Elevated cholesterol    Facet arthropathy, lumbosacral 08/28/2017   Family history of adverse reaction to anesthesia    sibling has N/V   GERD (gastroesophageal reflux disease)    Heart murmur    History of colon polyps    History of kidney stones    has small ones   IBS (irritable bowel syndrome)    Inappropriate sinus tachycardia    Migraines    Osteopenia    Osteoporosis of femur without pathological fracture 08/28/2017   Palpitations    PONV (postoperative nausea and vomiting)    Premature surgical menopause    PTSD (post-traumatic stress disorder)    Sleep apnea    Past Surgical History:  Procedure Laterality Date   ANAL FISSURE REPAIR     APPENDECTOMY  2000   COLONOSCOPY W/ POLYPECTOMY  03/2015   COLONOSCOPY WITH ESOPHAGOGASTRODUODENOSCOPY (EGD)  04/01/2015   KNEE SURGERY     OOPHORECTOMY     OOPHORECTOMY     1995, 1998   ORIF ANKLE FRACTURE  Right 11/28/2021   Procedure: OPEN REDUCTION INTERNAL FIXATION (ORIF) ANKLE FRACTURE;  Surgeon: Meredith Pel, MD;  Location: [facility];  Service: Orthopedics;  Laterality: Right;   PELVIC LAPAROSCOPY     x 4 for endometriosis    VAGINAL HYSTERECTOMY  1994   Patient Active Problem List   Diagnosis Date Noted   Migraine with aura and without status migrainosus, not intractable 02/23/2022   Acute right MCA stroke ([place]) 12/07/2021   GERD without esophagitis 12/07/2021   Right wrist fracture, with routine healing, subsequent encounter 12/07/2021   Bimalleolar ankle fracture, right, closed, with routine healing, subsequent encounter    Blepharospasm 11/08/2021   Sleep disorder, circadian, shift work type 08/01/2021   Excessive daytime sleepiness 08/01/2021   Morning headache 02/16/2021   Left eye pain 02/16/2021   Intractable episodic cluster headache 02/16/2021   Behavioral insomnia of childhood 02/16/2021   Retrognathia 02/16/2021   Chronic migraine without aura, with intractable migraine, so stated, with status migrainosus 01/12/2021   Muscle spasm 10/14/2020   Vitamin D deficiency 08/15/2020   Acute bilateral low back pain with bilateral sciatica  07/13/2020   Gastric polyp 02/11/2019   Sessile colonic polyp 02/11/2019   Chronic fatigue 02/11/2019   Grade I internal hemorrhoids 02/11/2019   Ductal hyperplasia of breast 08/21/2018   Cognitive changes 06/24/2018   Dyslipidemia 06/24/2018   Dense breast tissue on mammogram 04/17/2018   Fibrocystic breast determined by biopsy 04/09/2018   Family history of breast cancer in mother 04/09/2018   Paroxysmal sinus tachycardia ([place]) 03/27/2018   Osteoporosis 08/28/2017   Facet arthropathy, lumbosacral 08/28/2017   Chronic left SI joint pain 08/27/2017   Left lumbar radiculopathy 08/27/2017   Allergic rhinitis 08/09/2017   Disorder of lipoid metabolism 08/09/2017   Insomnia secondary to anxiety 08/09/2017   Postmenopausal osteoporosis  08/09/2017   History of hysterectomy for benign disease 08/09/2017   GAD (generalized anxiety disorder) 08/09/2017   Former smoker, stopped smoking in distant past 08/09/2017   Minor depressive disorder 08/09/2017   Palpitations 03/16/2015   Family history of colon cancer 03/02/2015   Hiatal hernia with GERD without esophagitis 03/02/2015   History of adenomatous polyp of colon 03/02/2015   Chronic migraine without aura 10/10/2014   History of cardiovascular disorder 01/18/2003    REFERRING DIAG: R53.1 (ICD-10-CM) - Left-sided weakness R41.4 (ICD-10-CM) - Left-sided neglect I63.411 (ICD-10-CM) - Cerebrovascular accident (CVA) due to embolism of right middle cerebral artery ([place]) I63.9,I77.70 (ICD-10-CM) - Cerebral ischemic stroke due to arterial dissection ([facility])   THERAPY DIAG:  Other abnormalities of gait and mobility  Muscle weakness (generalized)  PERTINENT HISTORY: SVT, migraine headaches, PTSD, anxiety, irritable bowel syndrome, GERD, osteoporosis, sleep apnea, left sciatica.   PRECAUTIONS: Fall, L hemi, WBAT R wrist and RLE   SUBJECTIVE: Pt reports she went to [facility] this weekend to visit a friend, had no issues with drive. Very fatigued this weekend, "I napped a lot". No new changes. Exercises are going well.   PAIN:  Are you having pain? Yes Pain Location: R shoulder and R wrist  Pain severity: 5-6/10 -when moving arm  OBJECTIVE  DIAGNOSTIC FINDINGS: MRI and CTA performed on 02/20/22: ICA compared to 12/14/2021 and 12/07/2021 CTA studies. Although they were apparently not compared at the time, there was already some improvement between the two February studies. There is no longer any stenosis.   Right MCA occlusion seen on 12/07/2021 is no longer present.  TODAY'S TREATMENT:   Ther Ex  Elliptical level 2 for 8 minutes (4 min fwd and 4 retro) for improved ankle ROM, BLE strength and cardiovascular conditioning. Provided S* while pt getting onto/off of elliptical.   NMR   -Farmer's carries, 24' holding 5# KB in LUE to imitate carrying groceries from car to house. Distant S* throughout. Noted decreased step length/clearance of RLE but no LOB or lateral path deviations. Progressed to 460' holding 10# KB in LUE.  -Ascended/descended 12 6" steps using L rail only (L side when ascending, R side when descending) and holding KB in RUE and switching hands at top of steps to practice switching hands to hold groceries when entering/exiting house. Pt able to perform mod I, using alternating technique to ascend and step-to technique to descend.  -using 4kg weighted ball, practiced squatting down and lifting ball off ground x20 for improved BLE strength, body mechanics and practice proper lifting technique for lifting groceries off ground/out of trunk of car. Pt fatigued very quickly but demonstrated good squat and lift technique throughout. Pt reported RLE fatigued more quickly than LLE. S* throughout for safety.    PATIENT EDUCATION: Education details: Continue HEP,  proper lifting technique.  Person educated: Patient Education method: Explanation and Demonstration  Education comprehension: verbalized understanding and needs further education   HOME EXERCISE PROGRAM: updated on 4/24 Access Code: [email or web address] URL: [email or web address] Date: 02/22/2022 Prepared by: Mickie Bail Magaly Pollina  Exercises - Proper Sit to Stand Technique  - 1 x daily - 7 x weekly - 3 sets - 10 reps - Seated Hamstring Stretch  - 1 x daily - 7 x weekly - 3 sets - 10 reps - 30 second hold - Seated Ankle Alphabet  - 1 x daily - 7 x weekly - 3 sets - 10 reps - Long Sitting Ankle Plantar Flexion with Resistance  - 1 x daily - 7 x weekly - 3 sets - 10 reps - Long Sitting Ankle Inversion with Resistance  - 1 x daily - 7 x weekly - 3 sets - 10 reps - Long Sitting Ankle Eversion with Resistance  - 1 x daily - 7 x weekly - 3 sets - 10 reps - Ankle Dorsiflexion with Resistance  - 1 x daily - 7 x weekly  - 3 sets - 10 reps - Seated Toe Towel Scrunches  - 1 x daily - 7 x weekly - 3 sets - 10 reps    SHORT TERM GOALS:   Target date: 03/12/2022  Pt will ascend/descend 12 6" steps using LRAD and no rail mod I w/step-to pattern to imitate home environment and improve independence  Baseline: pt ascended/descended 12 steps w/step-to pattern and no rails w/S*, mild instability descending  Goal status: NOT MET  2.  Pt will ambulate 250' w/SPC and S* for improved functional mobility  Baseline: using LBQC; 345' without AD mod I  Goal status: MET  3.  Pt will improve 5 x STS to less than or equal to 16 seconds without BUE support to demonstrate improved functional strength and transfer efficiency.   Baseline: 16.75s w/BUE; 12.37s without UE support  Goal status: MET  4.  Pt will improve normal TUG to less than or equal to 15 seconds w/LRAD for improved functional mobility and decreased fall risk.  Baseline: 21.54s w/LBQC; 9.38s without AD  Goal status: MET   LONG TERM GOALS:  Target date: 04/09/2022   Pt will improve Berg score to 50/56 for decreased fall risk  Baseline: 41/56 on 4/17 Goal status: INITIAL  2.  Pt will improve gait velocity to at least 3.8 ft/s with LRAD for improved gait efficiency and performance at limited community ambulator level   Baseline: 1.4 ft/s w/LBQC; 3.64 ft/s without AD on 5/25 Goal status: REVISED  3.  Pt will be independent with final HEP for improved strength, balance, transfers and gait.  Baseline:  Goal status: INITIAL  4.  Pt will ascend/descend 12 steps without AD and left rail using step-to pattern mod I to imitate home environment  Baseline: step-to w/LBQC Goal status: REVISED  5.  Pt will ambulate 500' without AD mod I for improved functional mobility and independence  Baseline: using LBQC w/S*; 18' no AD on 5/25 mod I  Goal status: REVISED  6. Pt will improve 5 x STS to less than or equal to 11 seconds with BUE support to demonstrate  improved functional strength and transfer efficiency.  Baseline: 16.75s with BUE support; 12.37s without UE support on 5/25   Goal Status: REVISED       Plan - 01/18/22 1230     Clinical Impression Statement Emphasis of skilled PT session on BLE strength, proper lifting technique  and imitation of home/work environment when carrying heavy objects. Pt demonstrated good technique and posture when holding 10# KB in LUE w/gait with no lateral path deviations or LOB noted. Pt very cautious when lifting objects from floor but tolerated activity well. Continue POC.    Personal Factors and Comorbidities Comorbidity 3+;Profession    Comorbidities see above    Examination-Activity Limitations Bathing;Bed Mobility;Dressing;Locomotion Level;Sit;Squat;Stairs;Stand;Transfers    Examination-Participation Restrictions Community Activity;Driving;Occupation;Meal Prep;Shop;Laundry    Stability/Clinical Decision Making Unstable/Unpredictable    Rehab Potential Good    PT Frequency 2x / week    PT Duration 16 weeks (recert)   PT Treatment/Interventions ADLs/Self Care Home Management;Aquatic Therapy;DME Instruction;Electrical Stimulation;Gait training;Stair training;Functional mobility training;Therapeutic activities;Therapeutic exercise;Balance training;Neuromuscular re-education;Cognitive remediation;Patient/family education;Manual techniques;Dry needling;Vestibular;Passive range of motion    PT Next Visit Plan Continue to progress farmer's carries. Deadlifts (staggered stance, regular). Stair training-step ups forwards and sideways, treadmill/elliptical training, BLE strengthening-target gluts and quads, sit <>stands, squats, obstacle navigation   PT Home Exercise Plan [email or web address]    Consulted and Agree with Plan of Care Patient                Cruzita Lederer Littie Chiem, PT, DPT 03/28/2022, 12:31 PM

## 2022-03-28 NOTE — Therapy (Signed)
[facility] [address] [place], [place], [address] Phone: [phone]   Fax:  [phone]  Occupational Therapy Treatment Patient Details  Name: Mishael Krysiak MRN: [id number] Date of Birth: 07/24/1961 Referring Provider (OT): Dr. Sarina Ill   Encounter Date: 03/28/2022   OT End of Session - 03/28/22 1237     Visit Number 18    Number of Visits 25    Date for OT Re-Evaluation 04/25/22    Authorization Type [employer] 2023  copay $40 per visit  No Ded , VL: [place]  Auth Reqd: after 12th visit.  Additional 10 visits approved per LL.    Authorization - Visit Number 17   corrected count   Authorization - Number of Visits 54    OT Start Time 7782    OT Stop Time 1315    OT Time Calculation (min) 40 min    Activity Tolerance Patient tolerated treatment well    Behavior During Therapy WFL for tasks assessed/performed             Past Medical History:  Diagnosis Date   Anal fissure    Anemia    in early 20's   Anxiety    Arthritis    back   Breast calcifications on mammogram    Cancer ([place])    pre cancer colon,cervix   Colon polyp    Dyslipidemia 06/24/2018   Elevated cholesterol    Facet arthropathy, lumbosacral 08/28/2017   Family history of adverse reaction to anesthesia    sibling has N/V   GERD (gastroesophageal reflux disease)    Heart murmur    History of colon polyps    History of kidney stones    has small ones   IBS (irritable bowel syndrome)    Inappropriate sinus tachycardia    Migraines    Osteopenia    Osteoporosis of femur without pathological fracture 08/28/2017   Palpitations    PONV (postoperative nausea and vomiting)    Premature surgical menopause    PTSD (post-traumatic stress disorder)    Sleep apnea     Past Surgical History:  Procedure Laterality Date   ANAL FISSURE REPAIR     APPENDECTOMY  2000   COLONOSCOPY W/ POLYPECTOMY  03/2015   COLONOSCOPY WITH  ESOPHAGOGASTRODUODENOSCOPY (EGD)  04/01/2015   KNEE SURGERY     OOPHORECTOMY     OOPHORECTOMY     1995, 1998   ORIF ANKLE FRACTURE Right 11/28/2021   Procedure: OPEN REDUCTION INTERNAL FIXATION (ORIF) ANKLE FRACTURE;  Surgeon: Meredith Pel, MD;  Location: [facility];  Service: Orthopedics;  Laterality: Right;   PELVIC LAPAROSCOPY     x 4 for endometriosis    VAGINAL HYSTERECTOMY  1994    There were no vitals filed for this visit.  SUBJECTIVE:   It's no better but no worse (re: Rt shoulder). I've been doing the exercises  PAIN:  Are you having pain? Yes NPRS scale: 3/10 at rest, 7/10 with movement Pain location: Rt shoulder Pain orientation: Right  PAIN TYPE:  pulling Pain description: constant Aggravating factors: movement Relieving factors: proper positioning, wt bearing    TREATMENT:   Patient reports no changes since doing ex's (no improvements or worse symptoms) except pain higher today at beginning of session.  Reviewed updated HEP issued on 03/22/22 in supine.   P/ROM in Rt sh flexion, abduction, ER, IR, horizontal abd/add in supine w/ little to no pain except w/ horizontal abd  Pt appears tighter in  flexion than scaption w/ PROM. Pt also tighter in external rotation  Joint mobs for anterior and posterior capsule GH joint Rt shoulder  03/22/22: A/ROM RT shoulder as follows (seated):  Flex = 110* Abd = 80*  ER approx 75% IR approx 50%  P/ROM Rt shoulder as follows (seated):  Flex = 130* Abd = 110*   PATIENT EDUCATION: 03/22/22: Education details: Updated/additional HEP for Rt shoulder Person educated: Patient Education method: Consulting civil engineer, Media planner, Verbal cues, and Handouts Education comprehension: verbalized understanding, returned demonstration, verbal cues required, and needs further education  HOME EXERCISE PROGRAM: 02/22/22: Shoulder HEP 03/22/22: Additional HEP     OT Short Term Goals - all STG's have been met      OT SHORT TERM GOAL #1    Title Pt will be independent with initial HEP for strength/activity tolerance.--check STGs 01/23/22    Time 4    Period Weeks    Status Achieved      OT SHORT TERM GOAL #2   Title Pt will be independent with initial HEP for R wrist ROM (once cleared by MD).    Time 4    Period Weeks    Status Achieved      OT SHORT TERM GOAL #3   Title Pt will perform BADLs mod I.    Time 4    Period Weeks    Status Achieved      OT SHORT TERM GOAL #4   Title Pt will perform simple home maintenance/snack prep with supervision.    Time 4    Period Weeks    Status Achieved               OT Long Term Goals - new and ongoing LTG's due 04/25/22      OT LONG TERM GOAL #1   Title Pt will be independent with updated strengthening HEP for bilateral UEs/R wrist (once cleared by MD).--check LTGs 03/25/22    Time 12    Period Weeks    Status MET     OT LONG TERM GOAL #2   Title Pt will perform simple clooking and cleaning tasks mod I.    Time 12    Period Weeks    Status MET except vacuuming     OT LONG TERM GOAL #3   Title Pt will be able to complete IADL task for at least 51mn prior to rest break.    Time 12    Period Weeks    Status MET     OT LONG TERM GOAL #4   Title Pt will demo at least 35lbs R grip strength for lifting/carrying objects and opening containers.    Time 12    Period Weeks    Status On-going   01/29/22: Rt = 19.1 lbs, 02/22/22: Rt = 25.3 lbs  (Lt = 44.7 lbs)     OT LONG TERM GOAL #5   Title Pt will demo at least 60* wrist flex/ext for ADLs/IADLs.    Time 12    Period Weeks    Status MET  flex = 65*, ext = 60*           NEW LTG's for renewal period:   1) Pt will report pain 3/10 or under with mid to higher level reaching  Baseline: 5-10/10  Status: in progress  2) Pt will demo Rt shoulder flexion to 125* or greater to retrieve light weight objects from high shelf, and shoulder abduction to 100* or greater  Baseline: flex = 110*,  abd = 80*  Status: in  progress       Plan - 03/20/22 1430     Clinical Impression Statement Pt with renewal today to focus more on Rt shoulder pain and continue with ongoing goal for Rt grip strength.   OT Occupational Profile and History Detailed Assessment- Review of Records and additional review of physical, cognitive, psychosocial history related to current functional performance    Occupational performance deficits (Please refer to evaluation for details): ADL's;IADL's;Work;Leisure    Body Structure / Function / Physical Skills ADL;Decreased knowledge of use of DME;Strength;Balance;UE functional use;ROM;IADL;Endurance;Mobility    Rehab Potential Good    OT Frequency 2x / week    OT Duration 4 more weeks    OT Treatment/Interventions Self-care/ADL training;Moist Heat;Fluidtherapy;DME and/or AE instruction;Splinting;Therapeutic activities;Balance training;Contrast Bath;Ultrasound;Therapeutic exercise;Passive range of motion;Functional Mobility Training;Neuromuscular education;Cryotherapy;Electrical Stimulation;Paraffin;Manual Therapy;Patient/family education;Energy conservation    Plan Begin with joint mobs and P/ROM supine, then move to AA/ROM and A/ROM as tolerated to see if this improves   Consulted and Agree with Plan of Care Patient               Patient will benefit from skilled therapeutic intervention in order to improve the following deficits and impairments:           Visit Diagnosis: Acute pain of right shoulder  Stiffness of right shoulder, not elsewhere classified    Problem List Patient Active Problem List   Diagnosis Date Noted   Migraine with aura and without status migrainosus, not intractable 02/23/2022   Acute right MCA stroke ([facility]) 12/07/2021   GERD without esophagitis 12/07/2021   Right wrist fracture, with routine healing, subsequent encounter 12/07/2021   Bimalleolar ankle fracture, right, closed, with routine healing, subsequent encounter    Blepharospasm  11/08/2021   Sleep disorder, circadian, shift work type 08/01/2021   Excessive daytime sleepiness 08/01/2021   Morning headache 02/16/2021   Left eye pain 02/16/2021   Intractable episodic cluster headache 02/16/2021   Behavioral insomnia of childhood 02/16/2021   Retrognathia 02/16/2021   Chronic migraine without aura, with intractable migraine, so stated, with status migrainosus 01/12/2021   Muscle spasm 10/14/2020   Vitamin D deficiency 08/15/2020   Acute bilateral low back pain with bilateral sciatica 07/13/2020   Gastric polyp 02/11/2019   Sessile colonic polyp 02/11/2019   Chronic fatigue 02/11/2019   Grade I internal hemorrhoids 02/11/2019   Ductal hyperplasia of breast 08/21/2018   Cognitive changes 06/24/2018   Dyslipidemia 06/24/2018   Dense breast tissue on mammogram 04/17/2018   Fibrocystic breast determined by biopsy 04/09/2018   Family history of breast cancer in mother 04/09/2018   Paroxysmal sinus tachycardia ([facility]) 03/27/2018   Osteoporosis 08/28/2017   Facet arthropathy, lumbosacral 08/28/2017   Chronic left SI joint pain 08/27/2017   Left lumbar radiculopathy 08/27/2017   Allergic rhinitis 08/09/2017   Disorder of lipoid metabolism 08/09/2017   Insomnia secondary to anxiety 08/09/2017   Postmenopausal osteoporosis 08/09/2017   History of hysterectomy for benign disease 08/09/2017   GAD (generalized anxiety disorder) 08/09/2017   Former smoker, stopped smoking in distant past 08/09/2017   Minor depressive disorder 08/09/2017   Palpitations 03/16/2015   Family history of colon cancer 03/02/2015   Hiatal hernia with GERD without esophagitis 03/02/2015   History of adenomatous polyp of colon 03/02/2015   Chronic migraine without aura 10/10/2014   History of cardiovascular disorder 01/18/2003    Hans Eden, OT 03/28/2022, 12:38 PM  [facility]  [address] [place], [place], [address] Phone:  [phone]   Fax:  [phone]

## 2022-03-30 ENCOUNTER — Encounter: Payer: No Typology Code available for payment source | Admitting: Physician Assistant

## 2022-04-02 ENCOUNTER — Ambulatory Visit: Payer: No Typology Code available for payment source | Attending: Neurology | Admitting: Occupational Therapy

## 2022-04-02 ENCOUNTER — Ambulatory Visit: Payer: No Typology Code available for payment source

## 2022-04-02 ENCOUNTER — Encounter: Payer: Self-pay | Admitting: Occupational Therapy

## 2022-04-02 DIAGNOSIS — R2681 Unsteadiness on feet: Secondary | ICD-10-CM | POA: Insufficient documentation

## 2022-04-02 DIAGNOSIS — M25611 Stiffness of right shoulder, not elsewhere classified: Secondary | ICD-10-CM | POA: Diagnosis present

## 2022-04-02 DIAGNOSIS — M25631 Stiffness of right wrist, not elsewhere classified: Secondary | ICD-10-CM | POA: Diagnosis present

## 2022-04-02 DIAGNOSIS — R2689 Other abnormalities of gait and mobility: Secondary | ICD-10-CM

## 2022-04-02 DIAGNOSIS — M25511 Pain in right shoulder: Secondary | ICD-10-CM | POA: Diagnosis present

## 2022-04-02 DIAGNOSIS — M6281 Muscle weakness (generalized): Secondary | ICD-10-CM

## 2022-04-02 DIAGNOSIS — I69354 Hemiplegia and hemiparesis following cerebral infarction affecting left non-dominant side: Secondary | ICD-10-CM | POA: Insufficient documentation

## 2022-04-02 NOTE — Therapy (Signed)
OUTPATIENT PHYSICAL THERAPY TREATMENT NOTE   Patient Name: Alice Williams MRN: [id number] DOB:12-15-1960, 61 y.o., female Today's Date: 04/02/2022  PCP: Mackie Pai, PA-C REFERRING PROVIDER: Melvenia Beam, MD    PT End of Session - 04/02/22 1104     Visit Number 20    Number of Visits 25    Date for PT Re-Evaluation 04/12/22    Authorization Type [employer] ([facility]) Needs auth after 12th visit    Authorization Time Period 12 visits from 02/22/22-04/06/22    Authorization - Visit Number 4    Authorization - Number of Visits 12    PT Start Time 1101    PT Stop Time 1142    PT Time Calculation (min) 41 min    Equipment Utilized During Treatment Gait belt    Activity Tolerance Patient tolerated treatment well    Behavior During Therapy WFL for tasks assessed/performed              Past Medical History:  Diagnosis Date   Anal fissure    Anemia    in early 20's   Anxiety    Arthritis    back   Breast calcifications on mammogram    Cancer ([facility])    pre cancer colon,cervix   Colon polyp    Dyslipidemia 06/24/2018   Elevated cholesterol    Facet arthropathy, lumbosacral 08/28/2017   Family history of adverse reaction to anesthesia    sibling has N/V   GERD (gastroesophageal reflux disease)    Heart murmur    History of colon polyps    History of kidney stones    has small ones   IBS (irritable bowel syndrome)    Inappropriate sinus tachycardia    Migraines    Osteopenia    Osteoporosis of femur without pathological fracture 08/28/2017   Palpitations    PONV (postoperative nausea and vomiting)    Premature surgical menopause    PTSD (post-traumatic stress disorder)    Sleep apnea    Past Surgical History:  Procedure Laterality Date   ANAL FISSURE REPAIR     APPENDECTOMY  2000   COLONOSCOPY W/ POLYPECTOMY  03/2015   COLONOSCOPY WITH ESOPHAGOGASTRODUODENOSCOPY (EGD)  04/01/2015   KNEE SURGERY     OOPHORECTOMY     OOPHORECTOMY     1995, 1998   ORIF ANKLE  FRACTURE Right 11/28/2021   Procedure: OPEN REDUCTION INTERNAL FIXATION (ORIF) ANKLE FRACTURE;  Surgeon: Meredith Pel, MD;  Location: [facility];  Service: Orthopedics;  Laterality: Right;   PELVIC LAPAROSCOPY     x 4 for endometriosis    VAGINAL HYSTERECTOMY  1994   Patient Active Problem List   Diagnosis Date Noted   Migraine with aura and without status migrainosus, not intractable 02/23/2022   Acute right MCA stroke ([facility]) 12/07/2021   GERD without esophagitis 12/07/2021   Right wrist fracture, with routine healing, subsequent encounter 12/07/2021   Bimalleolar ankle fracture, right, closed, with routine healing, subsequent encounter    Blepharospasm 11/08/2021   Sleep disorder, circadian, shift work type 08/01/2021   Excessive daytime sleepiness 08/01/2021   Morning headache 02/16/2021   Left eye pain 02/16/2021   Intractable episodic cluster headache 02/16/2021   Behavioral insomnia of childhood 02/16/2021   Retrognathia 02/16/2021   Chronic migraine without aura, with intractable migraine, so stated, with status migrainosus 01/12/2021   Muscle spasm 10/14/2020   Vitamin D deficiency 08/15/2020   Acute bilateral low back pain with bilateral sciatica 07/13/2020   Gastric polyp 02/11/2019  Sessile colonic polyp 02/11/2019   Chronic fatigue 02/11/2019   Grade I internal hemorrhoids 02/11/2019   Ductal hyperplasia of breast 08/21/2018   Cognitive changes 06/24/2018   Dyslipidemia 06/24/2018   Dense breast tissue on mammogram 04/17/2018   Fibrocystic breast determined by biopsy 04/09/2018   Family history of breast cancer in mother 04/09/2018   Paroxysmal sinus tachycardia ([facility]) 03/27/2018   Osteoporosis 08/28/2017   Facet arthropathy, lumbosacral 08/28/2017   Chronic left SI joint pain 08/27/2017   Left lumbar radiculopathy 08/27/2017   Allergic rhinitis 08/09/2017   Disorder of lipoid metabolism 08/09/2017   Insomnia secondary to anxiety 08/09/2017   Postmenopausal  osteoporosis 08/09/2017   History of hysterectomy for benign disease 08/09/2017   GAD (generalized anxiety disorder) 08/09/2017   Former smoker, stopped smoking in distant past 08/09/2017   Minor depressive disorder 08/09/2017   Palpitations 03/16/2015   Family history of colon cancer 03/02/2015   Hiatal hernia with GERD without esophagitis 03/02/2015   History of adenomatous polyp of colon 03/02/2015   Chronic migraine without aura 10/10/2014   History of cardiovascular disorder 01/18/2003    REFERRING DIAG: R53.1 (ICD-10-CM) - Left-sided weakness R41.4 (ICD-10-CM) - Left-sided neglect I63.411 (ICD-10-CM) - Cerebrovascular accident (CVA) due to embolism of right middle cerebral artery (HCC) I63.9,I77.70 (ICD-10-CM) - Cerebral ischemic stroke due to arterial dissection (HCC)   THERAPY DIAG:  Other abnormalities of gait and mobility  Muscle weakness (generalized)  PERTINENT HISTORY: SVT, migraine headaches, PTSD, anxiety, irritable bowel syndrome, GERD, osteoporosis, sleep apnea, left sciatica.   PRECAUTIONS: Fall, L hemi, WBAT R wrist and RLE   SUBJECTIVE: Pt reports she went to [place] this weekend to visit a friend, had no issues with drive. Very fatigued this weekend, "I napped a lot". No new changes. Exercises are going well.   PAIN:  Are you having pain? Yes Pain Location: R shoulder and R wrist  Pain severity: 1/10 -when moving arm; took tylenol prior to session  OBJECTIVE  DIAGNOSTIC FINDINGS: MRI and CTA performed on 02/20/22: ICA compared to 12/14/2021 and 12/07/2021 CTA studies. Although they were apparently not compared at the time, there was already some improvement between the two February studies. There is no longer any stenosis.   Right MCA occlusion seen on 12/07/2021 is no longer present.  TODAY'S TREATMENT:   There ex   -NuStep x12 mins B UE/LE level 4, Steps >65   -gentle scar mobility for improved AROM dorsiflexion    -anterior L LE lunge/ lateral lunge for  improved R ankle ROM   Gait   - 150ft even ground farmers carry with 6# dumbell    -uneven ground farmers carry with 6# dumbell with CGA   PATIENT EDUCATION: Education details: Continue HEP, proper lifting technique.  Person educated: Patient Education method: Explanation and Demonstration  Education comprehension: verbalized understanding and needs further education   HOME EXERCISE PROGRAM: updated on 4/24 Access Code: [email or web address] URL: [email or web address] Date: 02/22/2022 Prepared by: Jannah Plaster  Exercises - Proper Sit to Stand Technique  - 1 x daily - 7 x weekly - 3 sets - 10 reps - Seated Hamstring Stretch  - 1 x daily - 7 x weekly - 3 sets - 10 reps - 30 second hold - Seated Ankle Alphabet  - 1 x daily - 7 x weekly - 3 sets - 10 reps - Long Sitting Ankle Plantar Flexion with Resistance  - 1 x daily - 7 x weekly - 3 sets - 10 reps - Long Sitting   Ankle Inversion with Resistance  - 1 x daily - 7 x weekly - 3 sets - 10 reps - Long Sitting Ankle Eversion with Resistance  - 1 x daily - 7 x weekly - 3 sets - 10 reps - Ankle Dorsiflexion with Resistance  - 1 x daily - 7 x weekly - 3 sets - 10 reps - Seated Toe Towel Scrunches  - 1 x daily - 7 x weekly - 3 sets - 10 reps    SHORT TERM GOALS:   Target date: 03/12/2022  Pt will ascend/descend 12 6" steps using LRAD and no rail mod I w/step-to pattern to imitate home environment and improve independence  Baseline: pt ascended/descended 12 steps w/step-to pattern and no rails w/S*, mild instability descending  Goal status: NOT MET  2.  Pt will ambulate 250' w/SPC and S* for improved functional mobility  Baseline: using LBQC; 345' without AD mod I  Goal status: MET  3.  Pt will improve 5 x STS to less than or equal to 16 seconds without BUE support to demonstrate improved functional strength and transfer efficiency.   Baseline: 16.75s w/BUE; 12.37s without UE support  Goal status: MET  4.  Pt will improve normal  TUG to less than or equal to 15 seconds w/LRAD for improved functional mobility and decreased fall risk.  Baseline: 21.54s w/LBQC; 9.38s without AD  Goal status: MET   LONG TERM GOALS:  Target date: 04/09/2022   Pt will improve Berg score to 50/56 for decreased fall risk  Baseline: 41/56 on 4/17 Goal status: INITIAL  2.  Pt will improve gait velocity to at least 3.8 ft/s with LRAD for improved gait efficiency and performance at limited community ambulator level   Baseline: 1.4 ft/s w/LBQC; 3.64 ft/s without AD on 5/25 Goal status: REVISED  3.  Pt will be independent with final HEP for improved strength, balance, transfers and gait.  Baseline:  Goal status: INITIAL  4.  Pt will ascend/descend 12 steps without AD and left rail using step-to pattern mod I to imitate home environment  Baseline: step-to w/LBQC Goal status: REVISED  5.  Pt will ambulate 500' without AD mod I for improved functional mobility and independence  Baseline: using LBQC w/S*; 345' no AD on 5/25 mod I  Goal status: REVISED  6. Pt will improve 5 x STS to less than or equal to 11 seconds with BUE support to demonstrate improved functional strength and transfer efficiency.  Baseline: 16.75s with BUE support; 12.37s without UE support on 5/25   Goal Status: REVISED       Plan - 01/18/22 1230     Clinical Impression Statement Patient seen for skilled PT session with emphasis on NMR endurance and ambulatory balance. She reports increasing her "challenge" at home carrying grocery bags over uneven terrain and up/down steps. She denies falls or significant difficulty with this. Patient noted to have decreased L stride length due to decreased R ankle ROM. Tolerated therex well for improved ROM. Continue POC.    Personal Factors and Comorbidities Comorbidity 3+;Profession    Comorbidities see above    Examination-Activity Limitations Bathing;Bed Mobility;Dressing;Locomotion Level;Sit;Squat;Stairs;Stand;Transfers     Examination-Participation Restrictions Community Activity;Driving;Occupation;Meal Prep;Shop;Laundry    Stability/Clinical Decision Making Unstable/Unpredictable    Rehab Potential Good    PT Frequency 2x / week    PT Duration 16 weeks (recert)   PT Treatment/Interventions ADLs/Self Care Home Management;Aquatic Therapy;DME Instruction;Electrical Stimulation;Gait training;Stair training;Functional mobility training;Therapeutic activities;Therapeutic exercise;Balance training;Neuromuscular re-education;Cognitive remediation;Patient/family education;Manual techniques;Dry   needling;Vestibular;Passive range of motion    PT Next Visit Plan Continue to progress farmer's carries. Deadlifts (staggered stance, regular). Stair training-step ups forwards and sideways, treadmill/elliptical training, BLE strengthening-target gluts and quads, sit <>stands, squats, obstacle navigation   PT [place] [id number]    Consulted and Agree with Plan of Care Patient                Debbora Dus, PT, DPT Debbora Dus, PT, DPT, CBIS  04/02/2022, 11:43 AM

## 2022-04-02 NOTE — Therapy (Signed)
[facility] [address] [place], [place], [address] Phone: [phone]   Fax:  [phone]  Occupational Therapy Treatment Patient Details  Name: Alice Williams MRN: [id number] Date of Birth: November 12, 1960 Referring Provider (OT): Dr. Sarina Ill   Encounter Date: 04/02/2022   OT End of Session - 04/02/22 1014     Visit Number 19    Number of Visits 25    Date for OT Re-Evaluation 04/25/22    Authorization Type [employer] 2023  copay $40 per visit  No Ded , VL: [place]  Auth Reqd: after 12th visit.  Additional 10 visits approved per LL.    Authorization - Visit Number 18   corrected count   Authorization - Number of Visits 22    OT Start Time 1012    OT Stop Time 1100    OT Time Calculation (min) 48 min    Activity Tolerance Patient tolerated treatment well    Behavior During Therapy WFL for tasks assessed/performed             Past Medical History:  Diagnosis Date   Anal fissure    Anemia    in early 20's   Anxiety    Arthritis    back   Breast calcifications on mammogram    Cancer ([place])    pre cancer colon,cervix   Colon polyp    Dyslipidemia 06/24/2018   Elevated cholesterol    Facet arthropathy, lumbosacral 08/28/2017   Family history of adverse reaction to anesthesia    sibling has N/V   GERD (gastroesophageal reflux disease)    Heart murmur    History of colon polyps    History of kidney stones    has small ones   IBS (irritable bowel syndrome)    Inappropriate sinus tachycardia    Migraines    Osteopenia    Osteoporosis of femur without pathological fracture 08/28/2017   Palpitations    PONV (postoperative nausea and vomiting)    Premature surgical menopause    PTSD (post-traumatic stress disorder)    Sleep apnea     Past Surgical History:  Procedure Laterality Date   ANAL FISSURE REPAIR     APPENDECTOMY  2000   COLONOSCOPY W/ POLYPECTOMY  03/2015   COLONOSCOPY WITH  ESOPHAGOGASTRODUODENOSCOPY (EGD)  04/01/2015   KNEE SURGERY     OOPHORECTOMY     OOPHORECTOMY     1995, 1998   ORIF ANKLE FRACTURE Right 11/28/2021   Procedure: OPEN REDUCTION INTERNAL FIXATION (ORIF) ANKLE FRACTURE;  Surgeon: Meredith Pel, MD;  Location: [facility];  Service: Orthopedics;  Laterality: Right;   PELVIC LAPAROSCOPY     x 4 for endometriosis    VAGINAL HYSTERECTOMY  1994    There were no vitals filed for this visit.  SUBJECTIVE:   It's a little better, still hurts, but better.  PAIN:  Are you having pain? Yes NPRS scale: 2-3/10 at rest, 6/10 with movement Pain location: Rt shoulder Pain orientation: Right  PAIN TYPE:  pulling Pain description: constant Aggravating factors: movement Relieving factors: proper positioning, wt bearing    TREATMENT:   Joint mobs for anterior and posterior capsule GH joint Rt shoulder  Supine: P/ROM in Rt sh flexion, abduction, ER, IR, horizontal abd/add, and large circumduction in supine w/ little to no pain except w/ horizontal abd and ER Cane ex's in AA/ROM supine for same movements as above BUE's.  (Low range ER/IR and w/ arms abducted w/ difficulty and pain)  Standing: Wall slides in BUE sh flexion (AA/ROM), RUE abduction, and BUE circumduction  Prone: scapula retraction x 10 w/ cues to perform correctly and prevent Lt shoulder hiking.  Prone: propped on elbows for chest lift, and scapula depression - noted Lt scapula winging and tight upper traps (partly from stroke on that side and partly from compensating d/t Rt shoulder pain)  UBE x 6 min, level 1 for UE reciprocal movement and endurance     03/22/22: A/ROM RT shoulder as follows (seated):  Flex = 110* Abd = 80*  ER approx 75% IR approx 50%  P/ROM Rt shoulder as follows (seated):  Flex = 130* Abd = 110*   PATIENT EDUCATION: 03/22/22: Education details: Updated/additional HEP for Rt shoulder Person educated: Patient Education method: Consulting civil engineer,  Media planner, Verbal cues, and Handouts Education comprehension: verbalized understanding, returned demonstration, verbal cues required, and needs further education  HOME EXERCISE PROGRAM: 02/22/22: Shoulder HEP 03/22/22: Additional HEP     OT Short Term Goals - all STG's have been met      OT SHORT TERM GOAL #1   Title Pt will be independent with initial HEP for strength/activity tolerance.--check STGs 01/23/22    Time 4    Period Weeks    Status Achieved      OT SHORT TERM GOAL #2   Title Pt will be independent with initial HEP for R wrist ROM (once cleared by MD).    Time 4    Period Weeks    Status Achieved      OT SHORT TERM GOAL #3   Title Pt will perform BADLs mod I.    Time 4    Period Weeks    Status Achieved      OT SHORT TERM GOAL #4   Title Pt will perform simple home maintenance/snack prep with supervision.    Time 4    Period Weeks    Status Achieved               OT Long Term Goals - new and ongoing LTG's due 04/25/22      OT LONG TERM GOAL #1   Title Pt will be independent with updated strengthening HEP for bilateral UEs/R wrist (once cleared by MD).--check LTGs 03/25/22    Time 12    Period Weeks    Status MET     OT LONG TERM GOAL #2   Title Pt will perform simple clooking and cleaning tasks mod I.    Time 12    Period Weeks    Status MET except vacuuming     OT LONG TERM GOAL #3   Title Pt will be able to complete IADL task for at least 34mn prior to rest break.    Time 12    Period Weeks    Status MET     OT LONG TERM GOAL #4   Title Pt will demo at least 35lbs R grip strength for lifting/carrying objects and opening containers.    Time 12    Period Weeks    Status On-going   01/29/22: Rt = 19.1 lbs, 02/22/22: Rt = 25.3 lbs  (Lt = 44.7 lbs)     OT LONG TERM GOAL #5   Title Pt will demo at least 60* wrist flex/ext for ADLs/IADLs.    Time 12    Period Weeks    Status MET  flex = 65*, ext = 60*           NEW LTG's for renewal  period:   1) Pt will report pain 3/10 or under with mid to higher level reaching  Baseline: 5-10/10  Status: in progress  2) Pt will demo Rt shoulder flexion to 125* or greater to retrieve light weight objects from high shelf, and shoulder abduction to 100* or greater  Baseline: flex = 110*, abd = 80*  Status: in progress       Plan - 03/20/22 1430     Clinical Impression Statement Pt with Lt shoulder/scapula compensations noted (partly from stroke and partly d/t compensations from Rt shoulder pain). Pt with decreased pain Rt shoulder after P/ROM   OT Occupational Profile and History Detailed Assessment- Review of Records and additional review of physical, cognitive, psychosocial history related to current functional performance    Occupational performance deficits (Please refer to evaluation for details): ADL's;IADL's;Work;Leisure    Body Structure / Function / Physical Skills ADL;Decreased knowledge of use of DME;Strength;Balance;UE functional use;ROM;IADL;Endurance;Mobility    Rehab Potential Good    OT Frequency 2x / week    OT Duration 4 more weeks    OT Treatment/Interventions Self-care/ADL training;Moist Heat;Fluidtherapy;DME and/or AE instruction;Splinting;Therapeutic activities;Balance training;Contrast Bath;Ultrasound;Therapeutic exercise;Passive range of motion;Functional Mobility Training;Neuromuscular education;Cryotherapy;Electrical Stimulation;Paraffin;Manual Therapy;Patient/family education;Energy conservation    Plan Continue with joint mobs and P/ROM supine, work in prone as well, then move to AA/ROM and A/ROM as tolerated    Consulted and Agree with Plan of Care Patient               Patient will benefit from skilled therapeutic intervention in order to improve the following deficits and impairments:           Visit Diagnosis: Acute pain of right shoulder  Stiffness of right shoulder, not elsewhere classified    Problem List Patient Active Problem  List   Diagnosis Date Noted   Migraine with aura and without status migrainosus, not intractable 02/23/2022   Acute right MCA stroke ([place]) 12/07/2021   GERD without esophagitis 12/07/2021   Right wrist fracture, with routine healing, subsequent encounter 12/07/2021   Bimalleolar ankle fracture, right, closed, with routine healing, subsequent encounter    Blepharospasm 11/08/2021   Sleep disorder, circadian, shift work type 08/01/2021   Excessive daytime sleepiness 08/01/2021   Morning headache 02/16/2021   Left eye pain 02/16/2021   Intractable episodic cluster headache 02/16/2021   Behavioral insomnia of childhood 02/16/2021   Retrognathia 02/16/2021   Chronic migraine without aura, with intractable migraine, so stated, with status migrainosus 01/12/2021   Muscle spasm 10/14/2020   Vitamin D deficiency 08/15/2020   Acute bilateral low back pain with bilateral sciatica 07/13/2020   Gastric polyp 02/11/2019   Sessile colonic polyp 02/11/2019   Chronic fatigue 02/11/2019   Grade I internal hemorrhoids 02/11/2019   Ductal hyperplasia of breast 08/21/2018   Cognitive changes 06/24/2018   Dyslipidemia 06/24/2018   Dense breast tissue on mammogram 04/17/2018   Fibrocystic breast determined by biopsy 04/09/2018   Family history of breast cancer in mother 04/09/2018   Paroxysmal sinus tachycardia ([place]) 03/27/2018   Osteoporosis 08/28/2017   Facet arthropathy, lumbosacral 08/28/2017   Chronic left SI joint pain 08/27/2017   Left lumbar radiculopathy 08/27/2017   Allergic rhinitis 08/09/2017   Disorder of lipoid metabolism 08/09/2017   Insomnia secondary to anxiety 08/09/2017   Postmenopausal osteoporosis 08/09/2017   History of hysterectomy for benign disease 08/09/2017   GAD (generalized anxiety disorder) 08/09/2017   Former smoker, stopped smoking in distant past 08/09/2017   Minor depressive disorder 08/09/2017  Palpitations 03/16/2015   Family history of colon cancer 03/02/2015    Hiatal hernia with GERD without esophagitis 03/02/2015   History of adenomatous polyp of colon 03/02/2015   Chronic migraine without aura 10/10/2014   History of cardiovascular disorder 01/18/2003    Hans Eden, OT 04/02/2022, 10:15 AM  [facility] [address] [place], [place], [address] Phone: [phone]   Fax:  [phone]

## 2022-04-04 ENCOUNTER — Ambulatory Visit: Payer: No Typology Code available for payment source | Admitting: Occupational Therapy

## 2022-04-04 ENCOUNTER — Encounter: Payer: Self-pay | Admitting: Physical Therapy

## 2022-04-04 ENCOUNTER — Ambulatory Visit: Payer: No Typology Code available for payment source | Admitting: Physical Therapy

## 2022-04-04 DIAGNOSIS — I69354 Hemiplegia and hemiparesis following cerebral infarction affecting left non-dominant side: Secondary | ICD-10-CM

## 2022-04-04 DIAGNOSIS — M6281 Muscle weakness (generalized): Secondary | ICD-10-CM

## 2022-04-04 DIAGNOSIS — M25511 Pain in right shoulder: Secondary | ICD-10-CM

## 2022-04-04 DIAGNOSIS — R2689 Other abnormalities of gait and mobility: Secondary | ICD-10-CM

## 2022-04-04 DIAGNOSIS — M25611 Stiffness of right shoulder, not elsewhere classified: Secondary | ICD-10-CM

## 2022-04-04 NOTE — Therapy (Signed)
[facility] [address] [place], [place], [address] Phone: [phone]   Fax:  [phone]  Occupational Therapy Treatment Patient Details  Name: Alice Williams MRN: [id number] Date of Birth: 04/14/1961 Referring Provider (OT): Dr. Sarina Ill   Encounter Date: 04/04/2022   OT End of Session - 04/04/22 1103     Visit Number 20    Number of Visits 25    Date for OT Re-Evaluation 04/25/22    Authorization Type [employer] 2023  copay $40 per visit  No Ded , VL: [place]  Auth Reqd: after 12th visit.  Additional 10 visits approved per LL.    Authorization - Visit Number 19   corrected count   Authorization - Number of Visits 22    OT Start Time 1100    OT Stop Time 1145    OT Time Calculation (min) 45 min    Activity Tolerance Patient tolerated treatment well    Behavior During Therapy WFL for tasks assessed/performed             Past Medical History:  Diagnosis Date   Anal fissure    Anemia    in early 20's   Anxiety    Arthritis    back   Breast calcifications on mammogram    Cancer ([place])    pre cancer colon,cervix   Colon polyp    Dyslipidemia 06/24/2018   Elevated cholesterol    Facet arthropathy, lumbosacral 08/28/2017   Family history of adverse reaction to anesthesia    sibling has N/V   GERD (gastroesophageal reflux disease)    Heart murmur    History of colon polyps    History of kidney stones    has small ones   IBS (irritable bowel syndrome)    Inappropriate sinus tachycardia    Migraines    Osteopenia    Osteoporosis of femur without pathological fracture 08/28/2017   Palpitations    PONV (postoperative nausea and vomiting)    Premature surgical menopause    PTSD (post-traumatic stress disorder)    Sleep apnea     Past Surgical History:  Procedure Laterality Date   ANAL FISSURE REPAIR     APPENDECTOMY  2000   COLONOSCOPY W/ POLYPECTOMY  03/2015   COLONOSCOPY WITH  ESOPHAGOGASTRODUODENOSCOPY (EGD)  04/01/2015   KNEE SURGERY     OOPHORECTOMY     OOPHORECTOMY     1995, 1998   ORIF ANKLE FRACTURE Right 11/28/2021   Procedure: OPEN REDUCTION INTERNAL FIXATION (ORIF) ANKLE FRACTURE;  Surgeon: Meredith Pel, MD;  Location: [facility];  Service: Orthopedics;  Laterality: Right;   PELVIC LAPAROSCOPY     x 4 for endometriosis    VAGINAL HYSTERECTOMY  1994    There were no vitals filed for this visit.  SUBJECTIVE:   I made dinner again last night (goulash)  PAIN:  Are you having pain? Yes NPRS scale: 2-3/10 at rest, 6/10 with movement Pain location: Rt shoulder Pain orientation: Right  PAIN TYPE:  pulling Pain description: constant Aggravating factors: movement Relieving factors: proper positioning, wt bearing    TREATMENT:   Joint mobs for anterior and posterior capsule GH joint Rt shoulder  Supine: P/ROM in Rt sh flexion, abduction, ER, IR, horizontal abd/add, and large circumduction in supine w/ little to no pain except w/ horizontal abd and ER Pt w/ noted tightness in pects w/ PROM in ER (arm abducted to 90*). Pt also reports pain along biceps.   Taping to relax  biceps and pects Rt shoulder and reviewed wear and care of kinesiotape  Standing: Wall slides in BUE sh flexion (AA/ROM), RUE abduction w/ pain (therefore d/c abduction) and BUE circumduction RUE AA/ROM in standing using UE Ranger.   **Noted pt has better A/ROM in flexion than abduction, however pt has better P/ROM in abduction (scaption) than flexion RUE. Pt also w/ elevated scapula Lt side d/t stroke on Lt side and possible compensations and sh hiking from Rt shoulder pain  UBE x 8 min, level 3 for UE reciprocal movement and endurance     03/22/22: A/ROM RT shoulder as follows (seated):  Flex = 110* Abd = 80*  ER approx 75% IR approx 50%  P/ROM Rt shoulder as follows (seated):  Flex = 130* Abd = 110*   PATIENT EDUCATION: 03/22/22: Education details:  Updated/additional HEP for Rt shoulder Person educated: Patient Education method: Consulting civil engineer, Media planner, Verbal cues, and Handouts Education comprehension: verbalized understanding, returned demonstration, verbal cues required, and needs further education  HOME EXERCISE PROGRAM: 02/22/22: Shoulder HEP 03/22/22: Additional HEP     OT Short Term Goals - all STG's have been met      OT SHORT TERM GOAL #1   Title Pt will be independent with initial HEP for strength/activity tolerance.--check STGs 01/23/22    Time 4    Period Weeks    Status Achieved      OT SHORT TERM GOAL #2   Title Pt will be independent with initial HEP for R wrist ROM (once cleared by MD).    Time 4    Period Weeks    Status Achieved      OT SHORT TERM GOAL #3   Title Pt will perform BADLs mod I.    Time 4    Period Weeks    Status Achieved      OT SHORT TERM GOAL #4   Title Pt will perform simple home maintenance/snack prep with supervision.    Time 4    Period Weeks    Status Achieved               OT Long Term Goals - new and ongoing LTG's due 04/25/22      OT LONG TERM GOAL #1   Title Pt will be independent with updated strengthening HEP for bilateral UEs/R wrist (once cleared by MD).--check LTGs 03/25/22    Time 12    Period Weeks    Status MET     OT LONG TERM GOAL #2   Title Pt will perform simple clooking and cleaning tasks mod I.    Time 12    Period Weeks    Status MET except vacuuming     OT LONG TERM GOAL #3   Title Pt will be able to complete IADL task for at least 56mn prior to rest break.    Time 12    Period Weeks    Status MET     OT LONG TERM GOAL #4   Title Pt will demo at least 35lbs R grip strength for lifting/carrying objects and opening containers.    Time 12    Period Weeks    Status On-going   01/29/22: Rt = 19.1 lbs, 02/22/22: Rt = 25.3 lbs  (Lt = 44.7 lbs)     OT LONG TERM GOAL #5   Title Pt will demo at least 60* wrist flex/ext for ADLs/IADLs.    Time  12    Period Weeks    Status MET  flex = 65*, ext = 60*           NEW LTG's for renewal period:   1) Pt will report pain 3/10 or under with mid to higher level reaching  Baseline: 5-10/10  Status: in progress  2) Pt will demo Rt shoulder flexion to 125* or greater to retrieve light weight objects from high shelf, and shoulder abduction to 100* or greater  Baseline: flex = 110*, abd = 80*  Status: in progress       Plan - 03/20/22 1430     Clinical Impression Statement Pt with Lt shoulder/scapula compensations noted (partly from stroke and partly d/t compensations from Rt shoulder pain). Pt with decreased pain Rt shoulder after P/ROM. Pt w/ increased passive abduction > flexion, however appears better actively in flexion   OT Occupational Profile and History Detailed Assessment- Review of Records and additional review of physical, cognitive, psychosocial history related to current functional performance    Occupational performance deficits (Please refer to evaluation for details): ADL's;IADL's;Work;Leisure    Body Structure / Function / Physical Skills ADL;Decreased knowledge of use of DME;Strength;Balance;UE functional use;ROM;IADL;Endurance;Mobility    Rehab Potential Good    OT Frequency 2x / week    OT Duration 4 more weeks    OT Treatment/Interventions Self-care/ADL training;Moist Heat;Fluidtherapy;DME and/or AE instruction;Splinting;Therapeutic activities;Balance training;Contrast Bath;Ultrasound;Therapeutic exercise;Passive range of motion;Functional Mobility Training;Neuromuscular education;Cryotherapy;Electrical Stimulation;Paraffin;Manual Therapy;Patient/family education;Energy conservation    Plan Continue with joint mobs and P/ROM supine, work in prone as well, then move to AA/ROM and A/ROM as tolerated    Consulted and Agree with Plan of Care Patient               Patient will benefit from skilled therapeutic intervention in order to improve the following  deficits and impairments:           Visit Diagnosis: Acute pain of right shoulder  Stiffness of right shoulder, not elsewhere classified  Hemiplegia and hemiparesis following cerebral infarction affecting left non-dominant side [facility])    Problem List Patient Active Problem List   Diagnosis Date Noted   Migraine with aura and without status migrainosus, not intractable 02/23/2022   Acute right MCA stroke ([place]) 12/07/2021   GERD without esophagitis 12/07/2021   Right wrist fracture, with routine healing, subsequent encounter 12/07/2021   Bimalleolar ankle fracture, right, closed, with routine healing, subsequent encounter    Blepharospasm 11/08/2021   Sleep disorder, circadian, shift work type 08/01/2021   Excessive daytime sleepiness 08/01/2021   Morning headache 02/16/2021   Left eye pain 02/16/2021   Intractable episodic cluster headache 02/16/2021   Behavioral insomnia of childhood 02/16/2021   Retrognathia 02/16/2021   Chronic migraine without aura, with intractable migraine, so stated, with status migrainosus 01/12/2021   Muscle spasm 10/14/2020   Vitamin D deficiency 08/15/2020   Acute bilateral low back pain with bilateral sciatica 07/13/2020   Gastric polyp 02/11/2019   Sessile colonic polyp 02/11/2019   Chronic fatigue 02/11/2019   Grade I internal hemorrhoids 02/11/2019   Ductal hyperplasia of breast 08/21/2018   Cognitive changes 06/24/2018   Dyslipidemia 06/24/2018   Dense breast tissue on mammogram 04/17/2018   Fibrocystic breast determined by biopsy 04/09/2018   Family history of breast cancer in mother 04/09/2018   Paroxysmal sinus tachycardia ([place]) 03/27/2018   Osteoporosis 08/28/2017   Facet arthropathy, lumbosacral 08/28/2017   Chronic left SI joint pain 08/27/2017   Left lumbar radiculopathy 08/27/2017   Allergic rhinitis 08/09/2017   Disorder of lipoid metabolism  08/09/2017   Insomnia secondary to anxiety 08/09/2017   Postmenopausal osteoporosis  08/09/2017   History of hysterectomy for benign disease 08/09/2017   GAD (generalized anxiety disorder) 08/09/2017   Former smoker, stopped smoking in distant past 08/09/2017   Minor depressive disorder 08/09/2017   Palpitations 03/16/2015   Family history of colon cancer 03/02/2015   Hiatal hernia with GERD without esophagitis 03/02/2015   History of adenomatous polyp of colon 03/02/2015   Chronic migraine without aura 10/10/2014   History of cardiovascular disorder 01/18/2003    Hans Eden, OT 04/04/2022, 11:04 AM  [facility] [address] [place], [place], [address] Phone: [phone]   Fax:  [phone]

## 2022-04-04 NOTE — Therapy (Signed)
OUTPATIENT PHYSICAL THERAPY TREATMENT NOTE   Patient Name: Alice Williams MRN: [id number] DOB:1961-10-22, 61 y.o., female Today's Date: 04/04/2022  PCP: Mackie Pai, PA-C REFERRING PROVIDER: Melvenia Beam, MD    PT End of Session - 04/04/22 1019     Visit Number 21    Number of Visits 25    Date for PT Re-Evaluation 04/12/22    Authorization Type [employer] ([facility]) Needs auth after 12th visit    Authorization Time Period 12 visits from 02/22/22-04/06/22    Authorization - Visit Number 5    Authorization - Number of Visits 12    PT Start Time 1017    PT Stop Time 1059    PT Time Calculation (min) 42 min    Equipment Utilized During Treatment Gait belt    Activity Tolerance Patient tolerated treatment well    Behavior During Therapy WFL for tasks assessed/performed              Past Medical History:  Diagnosis Date   Anal fissure    Anemia    in early 20's   Anxiety    Arthritis    back   Breast calcifications on mammogram    Cancer ([place])    pre cancer colon,cervix   Colon polyp    Dyslipidemia 06/24/2018   Elevated cholesterol    Facet arthropathy, lumbosacral 08/28/2017   Family history of adverse reaction to anesthesia    sibling has N/V   GERD (gastroesophageal reflux disease)    Heart murmur    History of colon polyps    History of kidney stones    has small ones   IBS (irritable bowel syndrome)    Inappropriate sinus tachycardia    Migraines    Osteopenia    Osteoporosis of femur without pathological fracture 08/28/2017   Palpitations    PONV (postoperative nausea and vomiting)    Premature surgical menopause    PTSD (post-traumatic stress disorder)    Sleep apnea    Past Surgical History:  Procedure Laterality Date   ANAL FISSURE REPAIR     APPENDECTOMY  2000   COLONOSCOPY W/ POLYPECTOMY  03/2015   COLONOSCOPY WITH ESOPHAGOGASTRODUODENOSCOPY (EGD)  04/01/2015   KNEE SURGERY     OOPHORECTOMY     OOPHORECTOMY     1995, 1998   ORIF ANKLE  FRACTURE Right 11/28/2021   Procedure: OPEN REDUCTION INTERNAL FIXATION (ORIF) ANKLE FRACTURE;  Surgeon: Meredith Pel, MD;  Location: [facility];  Service: Orthopedics;  Laterality: Right;   PELVIC LAPAROSCOPY     x 4 for endometriosis    VAGINAL HYSTERECTOMY  1994   Patient Active Problem List   Diagnosis Date Noted   Migraine with aura and without status migrainosus, not intractable 02/23/2022   Acute right MCA stroke ([place]) 12/07/2021   GERD without esophagitis 12/07/2021   Right wrist fracture, with routine healing, subsequent encounter 12/07/2021   Bimalleolar ankle fracture, right, closed, with routine healing, subsequent encounter    Blepharospasm 11/08/2021   Sleep disorder, circadian, shift work type 08/01/2021   Excessive daytime sleepiness 08/01/2021   Morning headache 02/16/2021   Left eye pain 02/16/2021   Intractable episodic cluster headache 02/16/2021   Behavioral insomnia of childhood 02/16/2021   Retrognathia 02/16/2021   Chronic migraine without aura, with intractable migraine, so stated, with status migrainosus 01/12/2021   Muscle spasm 10/14/2020   Vitamin D deficiency 08/15/2020   Acute bilateral low back pain with bilateral sciatica 07/13/2020   Gastric polyp 02/11/2019  Sessile colonic polyp 02/11/2019   Chronic fatigue 02/11/2019   Grade I internal hemorrhoids 02/11/2019   Ductal hyperplasia of breast 08/21/2018   Cognitive changes 06/24/2018   Dyslipidemia 06/24/2018   Dense breast tissue on mammogram 04/17/2018   Fibrocystic breast determined by biopsy 04/09/2018   Family history of breast cancer in mother 04/09/2018   Paroxysmal sinus tachycardia ([place]) 03/27/2018   Osteoporosis 08/28/2017   Facet arthropathy, lumbosacral 08/28/2017   Chronic left SI joint pain 08/27/2017   Left lumbar radiculopathy 08/27/2017   Allergic rhinitis 08/09/2017   Disorder of lipoid metabolism 08/09/2017   Insomnia secondary to anxiety 08/09/2017   Postmenopausal  osteoporosis 08/09/2017   History of hysterectomy for benign disease 08/09/2017   GAD (generalized anxiety disorder) 08/09/2017   Former smoker, stopped smoking in distant past 08/09/2017   Minor depressive disorder 08/09/2017   Palpitations 03/16/2015   Family history of colon cancer 03/02/2015   Hiatal hernia with GERD without esophagitis 03/02/2015   History of adenomatous polyp of colon 03/02/2015   Chronic migraine without aura 10/10/2014   History of cardiovascular disorder 01/18/2003    REFERRING DIAG: R53.1 (ICD-10-CM) - Left-sided weakness R41.4 (ICD-10-CM) - Left-sided neglect I63.411 (ICD-10-CM) - Cerebrovascular accident (CVA) due to embolism of right middle cerebral artery ([place]) I63.9,I77.70 (ICD-10-CM) - Cerebral ischemic stroke due to arterial dissection (HCC)   THERAPY DIAG:  Other abnormalities of gait and mobility  Muscle weakness (generalized)  Hemiplegia and hemiparesis following cerebral infarction affecting left non-dominant side (HCC)  PERTINENT HISTORY: SVT, migraine headaches, PTSD, anxiety, irritable bowel syndrome, GERD, osteoporosis, sleep apnea, left sciatica.   PRECAUTIONS: Fall, L hemi, WBAT R wrist and RLE   SUBJECTIVE: No falls at home.  No changes to report.  PAIN:  Are you having pain? Yes Pain Location: R shoulder and R wrist  Pain severity: 6/10 -when moving arm; again she took tylenol and ibuprofen prior to session  OBJECTIVE  DIAGNOSTIC FINDINGS: MRI and CTA performed on 02/20/22: ICA compared to 12/14/2021 and 12/07/2021 CTA studies. Although they were apparently not compared at the time, there was already some improvement between the two February studies. There is no longer any stenosis.   Right MCA occlusion seen on 12/07/2021 is no longer present.  TODAY'S TREATMENT:   THEREX: -Treadmill training at 2.19mh progressing to 2% incline for dynamic cardiovascular warmup and BLE strengthening and endurance. -Unilateral farmer's carry w/  10# kettlebell 2x115' alt UE>15# kettlebell  -Bilateral farmer's carry w/ 8# dumbbells x115'>10# weights 2x115'  -10# kettlebell deadlift>staggered stance deadlift w/ 10# -STS w/ 5# dumbbell overhead press (AAROM from LUE when performing w/ RUE) -forward 6" step ups no UE support x15 alt LE > lateral step-ups x12 each LE w/ unilateral UE support   PATIENT EDUCATION: Education details: Continue HEP, proper lifting technique.  Person educated: Patient Education method: Explanation and Demonstration  Education comprehension: verbalized understanding and needs further education   HOME EXERCISE PROGRAM: updated on 4/24 Access Code: [email or web address]: [email or web address] Date: 02/22/2022 Prepared by: JMickie BailPlaster  Exercises - Proper Sit to Stand Technique  - 1 x daily - 7 x weekly - 3 sets - 10 reps - Seated Hamstring Stretch  - 1 x daily - 7 x weekly - 3 sets - 10 reps - 30 second hold - Seated Ankle Alphabet  - 1 x daily - 7 x weekly - 3 sets - 10 reps - Long Sitting Ankle Plantar Flexion with Resistance  - 1 x daily - 7 x weekly -  3 sets - 10 reps - Long Sitting Ankle Inversion with Resistance  - 1 x daily - 7 x weekly - 3 sets - 10 reps - Long Sitting Ankle Eversion with Resistance  - 1 x daily - 7 x weekly - 3 sets - 10 reps - Ankle Dorsiflexion with Resistance  - 1 x daily - 7 x weekly - 3 sets - 10 reps - Seated Toe Towel Scrunches  - 1 x daily - 7 x weekly - 3 sets - 10 reps    SHORT TERM GOALS:   Target date: 03/12/2022  Pt will ascend/descend 12 6" steps using LRAD and no rail mod I w/step-to pattern to imitate home environment and improve independence  Baseline: pt ascended/descended 12 steps w/step-to pattern and no rails w/S*, mild instability descending  Goal status: NOT MET  2.  Pt will ambulate 250' w/SPC and S* for improved functional mobility  Baseline: using LBQC; 345' without AD mod I  Goal status: MET  3.  Pt will improve 5 x STS to less than or  equal to 16 seconds without BUE support to demonstrate improved functional strength and transfer efficiency.   Baseline: 16.75s w/BUE; 12.37s without UE support  Goal status: MET  4.  Pt will improve normal TUG to less than or equal to 15 seconds w/LRAD for improved functional mobility and decreased fall risk.  Baseline: 21.54s w/LBQC; 9.38s without AD  Goal status: MET   LONG TERM GOALS:  Target date: 04/09/2022   Pt will improve Berg score to 50/56 for decreased fall risk  Baseline: 41/56 on 4/17 Goal status: INITIAL  2.  Pt will improve gait velocity to at least 3.8 ft/s with LRAD for improved gait efficiency and performance at limited community ambulator level   Baseline: 1.4 ft/s w/LBQC; 3.64 ft/s without AD on 5/25 Goal status: REVISED  3.  Pt will be independent with final HEP for improved strength, balance, transfers and gait.  Baseline:  Goal status: INITIAL  4.  Pt will ascend/descend 12 steps without AD and left rail using step-to pattern mod I to imitate home environment  Baseline: step-to w/LBQC Goal status: REVISED  5.  Pt will ambulate 500' without AD mod I for improved functional mobility and independence  Baseline: using LBQC w/S*; 82' no AD on 5/25 mod I  Goal status: REVISED  6. Pt will improve 5 x STS to less than or equal to 11 seconds with BUE support to demonstrate improved functional strength and transfer efficiency.  Baseline: 16.75s with BUE support; 12.37s without UE support on 5/25   Goal Status: REVISED       Plan - 01/18/22 1230     Clinical Impression Statement Patient tolerates progression of lifting tasks well today with emphasis on posture and weight progression.  She demonstrates good BLE mechanics during treadmill training at onset of session.  End of session focused on addressing functional strengthening with plan to continue addressing deficits as outlined in POC.   Personal Factors and Comorbidities Comorbidity 3+;Profession     Comorbidities see above    Examination-Activity Limitations Bathing;Bed Mobility;Dressing;Locomotion Level;Sit;Squat;Stairs;Stand;Transfers    Examination-Participation Restrictions Community Activity;Driving;Occupation;Meal Prep;Shop;Laundry    Stability/Clinical Decision Making Unstable/Unpredictable    Rehab Potential Good    PT Frequency 2x / week    PT Duration 16 weeks (recert)   PT Treatment/Interventions ADLs/Self Care Home Management;Aquatic Therapy;DME Instruction;Electrical Stimulation;Gait training;Stair training;Functional mobility training;Therapeutic activities;Therapeutic exercise;Balance training;Neuromuscular re-education;Cognitive remediation;Patient/family education;Manual techniques;Dry needling;Vestibular;Passive range of motion  PT Next Visit Plan LTGs due 6/12 (I missed these)-D/C?  Continue to progress farmer's carries. Deadlifts (staggered stance, regular). Stair training-step ups forwards and sideways, treadmill/elliptical training, BLE strengthening-target gluts and quads, sit <>stands, squats, obstacle navigation   PT [place] [id number]    Consulted and Agree with Plan of Care Patient                Bary Richard, PT, DPT 04/04/2022, 5:20 PM

## 2022-04-09 ENCOUNTER — Other Ambulatory Visit (HOSPITAL_COMMUNITY): Payer: Self-pay

## 2022-04-09 NOTE — Therapy (Signed)
[facility] [address] [place], [place], [address] Phone: [phone]   Fax:  [phone]  Occupational Therapy Treatment Patient Details  Name: Alice Williams MRN: [id number] Date of Birth: [birth year] Referring Provider (OT): Dr. Sarina Ill   Encounter Date: 04/10/2022   OT End of Session - 04/10/22 1321     Visit Number 21    Number of Visits 25    Date for OT Re-Evaluation 04/25/22    Authorization Type [employer] 2023  copay $40 per visit  No Ded , VL: [place]  Auth Reqd: after 12th visit.  Additional 10 visits approved per LL.    Authorization - Visit Number 20   corrected count   Authorization - Number of Visits 22    OT Start Time 4497    OT Stop Time 1400    OT Time Calculation (min) 39 min    Activity Tolerance Patient tolerated treatment well    Behavior During Therapy WFL for tasks assessed/performed              Past Medical History:  Diagnosis Date   Anal fissure    Anemia    in early 20's   Anxiety    Arthritis    back   Breast calcifications on mammogram    Cancer ([place])    pre cancer colon,cervix   Colon polyp    Dyslipidemia 06/24/2018   Elevated cholesterol    Facet arthropathy, lumbosacral 08/28/2017   Family history of adverse reaction to anesthesia    sibling has N/V   GERD (gastroesophageal reflux disease)    Heart murmur    History of colon polyps    History of kidney stones    has small ones   IBS (irritable bowel syndrome)    Inappropriate sinus tachycardia    Migraines    Osteopenia    Osteoporosis of femur without pathological fracture 08/28/2017   Palpitations    PONV (postoperative nausea and vomiting)    Premature surgical menopause    PTSD (post-traumatic stress disorder)    Sleep apnea     Past Surgical History:  Procedure Laterality Date   ANAL FISSURE REPAIR     APPENDECTOMY  2000   COLONOSCOPY W/ POLYPECTOMY  03/2015   COLONOSCOPY WITH  ESOPHAGOGASTRODUODENOSCOPY (EGD)  04/01/2015   KNEE SURGERY     OOPHORECTOMY     OOPHORECTOMY     1995, 1998   ORIF ANKLE FRACTURE Right 11/28/2021   Procedure: OPEN REDUCTION INTERNAL FIXATION (ORIF) ANKLE FRACTURE;  Surgeon: Meredith Pel, MD;  Location: [facility];  Service: Orthopedics;  Laterality: Right;   PELVIC LAPAROSCOPY     x 4 for endometriosis    VAGINAL HYSTERECTOMY  1994    There were no vitals filed for this visit.  SUBJECTIVE:   Pt reports continued R shoulder pain   PAIN:  Are you having pain? Yes NPRS scale: 1-2/10 at rest, 3-4/10 with movement Pain location: Rt shoulder (proximal, lateral, and down elbow at times) Pain orientation: Right  PAIN TYPE:  pulling Pain description: constant Aggravating factors: movement Relieving factors: proper positioning, wt bearing    TREATMENT:   In supine, light joint mobs to R shoulder followed by PROM R shoulder in flex, abduction, ER, IR, and circumduction in supine.    Supine, AAROM shoulder flex, chest press, low and high range shoulder ER with min cueing for positioning.  Sitting, AAROM shoulder flex, scapular retraction, and abduction with min cueing/facilitation for positioning.  Standing, AAROM shoulder IR, ER with scapular retraction with min cueing for positioning.   In sidelying, AAROM shoulder flex and scapular retraction with scapular facilitation.  In prone, scapular retraction with BUEs in shoulder flex, AAROM shoulder flex/ext and circumduction (off edge of mat).     ROM MEASUREMENTS:   03/22/22: A/ROM RT shoulder as follows (seated):  Flex = 110* Abd = 80*  ER approx 75% IR approx 50%  P/ROM Rt shoulder as follows (seated):  Flex = 130* Abd = 110*   PATIENT EDUCATION: 03/22/22: Education details: Proper positioning of RUE (avoid elbow flex and IR, and focus on chest up posture)  Person educated: Patient Education method: Explanation, Demonstration, and Verbal cues Education  comprehension: verbalized understanding, returned demonstration, verbal cues required, and needs further education  HOME EXERCISE PROGRAM: 02/22/22: Shoulder HEP 03/22/22: Additional HEP     OT Short Term Goals - all STG's have been met      OT SHORT TERM GOAL #1   Title Pt will be independent with initial HEP for strength/activity tolerance.--check STGs 01/23/22    Time 4    Period Weeks    Status Achieved      OT SHORT TERM GOAL #2   Title Pt will be independent with initial HEP for R wrist ROM (once cleared by MD).    Time 4    Period Weeks    Status Achieved      OT SHORT TERM GOAL #3   Title Pt will perform BADLs mod I.    Time 4    Period Weeks    Status Achieved      OT SHORT TERM GOAL #4   Title Pt will perform simple home maintenance/snack prep with supervision.    Time 4    Period Weeks    Status Achieved               OT Long Term Goals - new and ongoing LTG's due 04/25/22      OT LONG TERM GOAL #1   Title Pt will be independent with updated strengthening HEP for bilateral UEs/R wrist (once cleared by MD).--check LTGs 03/25/22    Time 12    Period Weeks    Status MET     OT LONG TERM GOAL #2   Title Pt will perform simple clooking and cleaning tasks mod I.    Time 12    Period Weeks    Status MET except vacuuming     OT LONG TERM GOAL #3   Title Pt will be able to complete IADL task for at least 42mn prior to rest break.    Time 12    Period Weeks    Status MET     OT LONG TERM GOAL #4   Title Pt will demo at least 35lbs R grip strength for lifting/carrying objects and opening containers.    Time 12    Period Weeks    Status On-going   01/29/22: Rt = 19.1 lbs, 02/22/22: Rt = 25.3 lbs  (Lt = 44.7 lbs)     OT LONG TERM GOAL #5   Title Pt will demo at least 60* wrist flex/ext for ADLs/IADLs.    Time 12    Period Weeks    Status MET  flex = 65*, ext = 60*           NEW LTG's for renewal period:   1) Pt will report pain 3/10 or under  with mid to higher level  reaching  Baseline: 5-10/10  Status: in progress  2) Pt will demo Rt shoulder flexion to 125* or greater to retrieve light weight objects from high shelf, and shoulder abduction to 100* or greater  Baseline: flex = 110*, abd = 80*  Status: in progress       Plan - 03/20/22 1430     Clinical Impression Statement Pt continues to demo pain and compensations with RUE movement as well as stiffness.    OT Occupational Profile and History Detailed Assessment- Review of Records and additional review of physical, cognitive, psychosocial history related to current functional performance    Occupational performance deficits (Please refer to evaluation for details): ADL's;IADL's;Work;Leisure    Body Structure / Function / Physical Skills ADL;Decreased knowledge of use of DME;Strength;Balance;UE functional use;ROM;IADL;Endurance;Mobility    Rehab Potential Good    OT Frequency 2x / week    OT Duration 4 more weeks    OT Treatment/Interventions Self-care/ADL training;Moist Heat;Fluidtherapy;DME and/or AE instruction;Splinting;Therapeutic activities;Balance training;Contrast Bath;Ultrasound;Therapeutic exercise;Passive range of motion;Functional Mobility Training;Neuromuscular education;Cryotherapy;Electrical Stimulation;Paraffin;Manual Therapy;Patient/family education;Energy conservation    Plan Continue with joint mobs and P/ROM supine, work in prone as well, then move to AA/ROM and A/ROM as tolerated    Consulted and Agree with Plan of Care Patient               Patient will benefit from skilled therapeutic intervention in order to improve the following deficits and impairments:           Visit Diagnosis: Acute pain of right shoulder  Stiffness of right shoulder, not elsewhere classified    Problem List Patient Active Problem List   Diagnosis Date Noted   Migraine with aura and without status migrainosus, not intractable 02/23/2022   Acute right MCA  stroke ([place]) 12/07/2021   GERD without esophagitis 12/07/2021   Right wrist fracture, with routine healing, subsequent encounter 12/07/2021   Bimalleolar ankle fracture, right, closed, with routine healing, subsequent encounter    Blepharospasm 11/08/2021   Sleep disorder, circadian, shift work type 08/01/2021   Excessive daytime sleepiness 08/01/2021   Morning headache 02/16/2021   Left eye pain 02/16/2021   Intractable episodic cluster headache 02/16/2021   Behavioral insomnia of childhood 02/16/2021   Retrognathia 02/16/2021   Chronic migraine without aura, with intractable migraine, so stated, with status migrainosus 01/12/2021   Muscle spasm 10/14/2020   Vitamin D deficiency 08/15/2020   Acute bilateral low back pain with bilateral sciatica 07/13/2020   Gastric polyp 02/11/2019   Sessile colonic polyp 02/11/2019   Chronic fatigue 02/11/2019   Grade I internal hemorrhoids 02/11/2019   Ductal hyperplasia of breast 08/21/2018   Cognitive changes 06/24/2018   Dyslipidemia 06/24/2018   Dense breast tissue on mammogram 04/17/2018   Fibrocystic breast determined by biopsy 04/09/2018   Family history of breast cancer in mother 04/09/2018   Paroxysmal sinus tachycardia ([place]) 03/27/2018   Osteoporosis 08/28/2017   Facet arthropathy, lumbosacral 08/28/2017   Chronic left SI joint pain 08/27/2017   Left lumbar radiculopathy 08/27/2017   Allergic rhinitis 08/09/2017   Disorder of lipoid metabolism 08/09/2017   Insomnia secondary to anxiety 08/09/2017   Postmenopausal osteoporosis 08/09/2017   History of hysterectomy for benign disease 08/09/2017   GAD (generalized anxiety disorder) 08/09/2017   Former smoker, stopped smoking in distant past 08/09/2017   Minor depressive disorder 08/09/2017   Palpitations 03/16/2015   Family history of colon cancer 03/02/2015   Hiatal hernia with GERD without esophagitis 03/02/2015   History of adenomatous  polyp of colon 03/02/2015   Chronic  migraine without aura 10/10/2014   History of cardiovascular disorder 01/18/2003    Vianne Bulls, OT 04/10/2022, 2:27 PM  [facility] [address] [place], [place], [address] Phone: [phone]   Fax:  [place], OTR/L [facility] [address]. [place], [place]  [address] [phone] phone [phone] 04/10/22 2:27 PM

## 2022-04-09 NOTE — Telephone Encounter (Signed)
I'm okay with her returning to doing that. Regarding the shoulder, recommend re-eval with xrays, looks like her xrays never got done last time?

## 2022-04-10 ENCOUNTER — Ambulatory Visit: Payer: No Typology Code available for payment source | Admitting: Physical Therapy

## 2022-04-10 ENCOUNTER — Ambulatory Visit: Payer: No Typology Code available for payment source | Admitting: Occupational Therapy

## 2022-04-10 ENCOUNTER — Encounter: Payer: Self-pay | Admitting: Occupational Therapy

## 2022-04-10 DIAGNOSIS — M25511 Pain in right shoulder: Secondary | ICD-10-CM | POA: Diagnosis not present

## 2022-04-10 DIAGNOSIS — M25611 Stiffness of right shoulder, not elsewhere classified: Secondary | ICD-10-CM

## 2022-04-10 DIAGNOSIS — R2689 Other abnormalities of gait and mobility: Secondary | ICD-10-CM

## 2022-04-10 DIAGNOSIS — R2681 Unsteadiness on feet: Secondary | ICD-10-CM

## 2022-04-10 NOTE — Therapy (Signed)
OUTPATIENT PHYSICAL THERAPY TREATMENT NOTE- DISCHARGE SUMMARY    Patient Name: Alice Williams MRN: [id number] DOB:09/08/1961, 61 y.o., female Today's Date: 04/10/2022  PCP: Mackie Pai, PA-C REFERRING PROVIDER: Melvenia Beam, MD   PHYSICAL THERAPY DISCHARGE SUMMARY  Visits from Start of Care: 22  Current functional level related to goals / functional outcomes: See below    Remaining deficits: R shoulder pain, decreased endurance, decreased strength, decreased balance w/narrow BOS tasks, impaired gait kinematics 2/2 L hemiparesis and R ankle ORIF.    Education / Equipment: HEP   Patient agrees to discharge. Patient goals were met. Patient is being discharged due to meeting the stated rehab goals.    PT End of Session - 04/10/22 1235     Visit Number 22    Number of Visits 25    Date for PT Re-Evaluation 04/12/22    Authorization Type [employer] [facility]) Needs auth after 12th visit    Authorization Time Period 12 visits from 02/22/22-04/06/22    Authorization - Visit Number 5    Authorization - Number of Visits 12    PT Start Time 7001    PT Stop Time 1316    PT Time Calculation (min) 42 min    Equipment Utilized During Treatment --    Activity Tolerance Patient tolerated treatment well    Behavior During Therapy WFL for tasks assessed/performed               Past Medical History:  Diagnosis Date   Anal fissure    Anemia    in early 20's   Anxiety    Arthritis    back   Breast calcifications on mammogram    Cancer ([place])    pre cancer colon,cervix   Colon polyp    Dyslipidemia 06/24/2018   Elevated cholesterol    Facet arthropathy, lumbosacral 08/28/2017   Family history of adverse reaction to anesthesia    sibling has N/V   GERD (gastroesophageal reflux disease)    Heart murmur    History of colon polyps    History of kidney stones    has small ones   IBS (irritable bowel syndrome)    Inappropriate sinus tachycardia    Migraines    Osteopenia     Osteoporosis of femur without pathological fracture 08/28/2017   Palpitations    PONV (postoperative nausea and vomiting)    Premature surgical menopause    PTSD (post-traumatic stress disorder)    Sleep apnea    Past Surgical History:  Procedure Laterality Date   ANAL FISSURE REPAIR     APPENDECTOMY  2000   COLONOSCOPY W/ POLYPECTOMY  03/2015   COLONOSCOPY WITH ESOPHAGOGASTRODUODENOSCOPY (EGD)  04/01/2015   KNEE SURGERY     OOPHORECTOMY     OOPHORECTOMY     1995, 1998   ORIF ANKLE FRACTURE Right 11/28/2021   Procedure: OPEN REDUCTION INTERNAL FIXATION (ORIF) ANKLE FRACTURE;  Surgeon: Meredith Pel, MD;  Location: [facility];  Service: Orthopedics;  Laterality: Right;   PELVIC LAPAROSCOPY     x 4 for endometriosis    VAGINAL HYSTERECTOMY  1994   Patient Active Problem List   Diagnosis Date Noted   Migraine with aura and without status migrainosus, not intractable 02/23/2022   Acute right MCA stroke ([place]) 12/07/2021   GERD without esophagitis 12/07/2021   Right wrist fracture, with routine healing, subsequent encounter 12/07/2021   Bimalleolar ankle fracture, right, closed, with routine healing, subsequent encounter    Blepharospasm 11/08/2021   Sleep  disorder, circadian, shift work type 08/01/2021   Excessive daytime sleepiness 08/01/2021   Morning headache 02/16/2021   Left eye pain 02/16/2021   Intractable episodic cluster headache 02/16/2021   Behavioral insomnia of childhood 02/16/2021   Retrognathia 02/16/2021   Chronic migraine without aura, with intractable migraine, so stated, with status migrainosus 01/12/2021   Muscle spasm 10/14/2020   Vitamin D deficiency 08/15/2020   Acute bilateral low back pain with bilateral sciatica 07/13/2020   Gastric polyp 02/11/2019   Sessile colonic polyp 02/11/2019   Chronic fatigue 02/11/2019   Grade I internal hemorrhoids 02/11/2019   Ductal hyperplasia of breast 08/21/2018   Cognitive changes 06/24/2018   Dyslipidemia  06/24/2018   Dense breast tissue on mammogram 04/17/2018   Fibrocystic breast determined by biopsy 04/09/2018   Family history of breast cancer in mother 04/09/2018   Paroxysmal sinus tachycardia ([place]) 03/27/2018   Osteoporosis 08/28/2017   Facet arthropathy, lumbosacral 08/28/2017   Chronic left SI joint pain 08/27/2017   Left lumbar radiculopathy 08/27/2017   Allergic rhinitis 08/09/2017   Disorder of lipoid metabolism 08/09/2017   Insomnia secondary to anxiety 08/09/2017   Postmenopausal osteoporosis 08/09/2017   History of hysterectomy for benign disease 08/09/2017   GAD (generalized anxiety disorder) 08/09/2017   Former smoker, stopped smoking in distant past 08/09/2017   Minor depressive disorder 08/09/2017   Palpitations 03/16/2015   Family history of colon cancer 03/02/2015   Hiatal hernia with GERD without esophagitis 03/02/2015   History of adenomatous polyp of colon 03/02/2015   Chronic migraine without aura 10/10/2014   History of cardiovascular disorder 01/18/2003    REFERRING DIAG: R53.1 (ICD-10-CM) - Left-sided weakness R41.4 (ICD-10-CM) - Left-sided neglect I63.411 (ICD-10-CM) - Cerebrovascular accident (CVA) due to embolism of right middle cerebral artery ([place]) I63.9,I77.70 (ICD-10-CM) - Cerebral ischemic stroke due to arterial dissection (HCC)   THERAPY DIAG:  Other abnormalities of gait and mobility  Unsteadiness on feet  PERTINENT HISTORY: SVT, migraine headaches, PTSD, anxiety, irritable bowel syndrome, GERD, osteoporosis, sleep apnea, left sciatica.   PRECAUTIONS: Fall, L hemi, WBAT R wrist and RLE   SUBJECTIVE: Pt reports she is moving into her house to live alone. Is returning to work this Thursday part-time. In agreement to DC from PT today.   PAIN:  Are you having pain? Yes Pain Location: R shoulder and R wrist  Pain severity: 2/10  OBJECTIVE  DIAGNOSTIC FINDINGS: MRI and CTA performed on 02/20/22: ICA compared to 12/14/2021 and 12/07/2021 CTA  studies. Although they were apparently not compared at the time, there was already some improvement between the two February studies. There is no longer any stenosis.   Right MCA occlusion seen on 12/07/2021 is no longer present.  TODAY'S TREATMENT:   There Act   LTG Assessment     [facility] PT Assessment - 04/10/22 1252       Berg Balance Test   Sit to Stand Able to stand without using hands and stabilize independently    Standing Unsupported Able to stand safely 2 minutes    Sitting with Back Unsupported but Feet Supported on Floor or Stool Able to sit safely and securely 2 minutes    Stand to Sit Sits safely with minimal use of hands    Transfers Able to transfer safely, minor use of hands    Standing Unsupported with Eyes Closed Able to stand 10 seconds safely    Standing Unsupported with Feet Together Able to place feet together independently and stand 1 minute safely  From Standing, Reach Forward with Outstretched Arm Can reach forward >12 cm safely (5")   9"   From Standing Position, Pick up Object from [place] to pick up shoe safely and easily    From Standing Position, Turn to Look Behind Over each Shoulder Looks behind from both sides and weight shifts well    Turn 360 Degrees Able to turn 360 degrees safely in 4 seconds or less   2.9s to R, 2.75s to L   Standing Unsupported, Alternately Place Feet on Step/Stool Able to stand independently and safely and complete 8 steps in 20 seconds   7.5s   Standing Unsupported, One Foot in Front Able to plae foot ahead of the other independently and hold 30 seconds    Standing on One Leg Able to lift leg independently and hold equal to or more than 3 seconds   22.38s on LLE, 3s on RLE   Total Score 52    Berg comment: low fall risk             STAIRS:  Level of Assistance: Modified independence  Stair Negotiation Technique: Step to Pattern Alternating Pattern  with Single Rail on Left  Number of Stairs: 12   Height of Stairs:  6"  Comments: Pt demonstrates reciprocal pattern to ascend and step-to pattern to descend, no LOB noted   Gait pattern: step through pattern, decreased arm swing- Right, decreased step length- Left, decreased stance time- Left, decreased ankle dorsiflexion- Left, and poor foot clearance- Left Distance walked: 575' Assistive device utilized: None Level of assistance: Modified independence Comments: Pt ambulated 5 laps around the clinic w/good cadence, no cues required. RPE 3/10    PATIENT EDUCATION: Education details: Goal assessment, obtaining new referral for PT if mobility needs change, continue walking program  Person educated: Patient Education method: Explanation and Demonstration  Education comprehension: verbalized understanding   HOME EXERCISE PROGRAM: updated on 4/24 Access Code: [email or web address] URL: [email or web address] Date: 02/22/2022 Prepared by: Mickie Bail Thadd Apuzzo  Exercises - Proper Sit to Stand Technique  - 1 x daily - 7 x weekly - 3 sets - 10 reps - Seated Hamstring Stretch  - 1 x daily - 7 x weekly - 3 sets - 10 reps - 30 second hold - Seated Ankle Alphabet  - 1 x daily - 7 x weekly - 3 sets - 10 reps - Long Sitting Ankle Plantar Flexion with Resistance  - 1 x daily - 7 x weekly - 3 sets - 10 reps - Long Sitting Ankle Inversion with Resistance  - 1 x daily - 7 x weekly - 3 sets - 10 reps - Long Sitting Ankle Eversion with Resistance  - 1 x daily - 7 x weekly - 3 sets - 10 reps - Ankle Dorsiflexion with Resistance  - 1 x daily - 7 x weekly - 3 sets - 10 reps - Seated Toe Towel Scrunches  - 1 x daily - 7 x weekly - 3 sets - 10 reps    SHORT TERM GOALS:   Target date: 03/12/2022  Pt will ascend/descend 12 6" steps using LRAD and no rail mod I w/step-to pattern to imitate home environment and improve independence  Baseline: pt ascended/descended 12 steps w/step-to pattern and no rails w/S*, mild instability descending  Goal status: NOT MET  2.  Pt will  ambulate 250' w/SPC and S* for improved functional mobility  Baseline: using LBQC; 345' without AD mod I  Goal status: MET  3.  Pt will  improve 5 x STS to less than or equal to 16 seconds without BUE support to demonstrate improved functional strength and transfer efficiency.   Baseline: 16.75s w/BUE; 12.37s without UE support  Goal status: MET  4.  Pt will improve normal TUG to less than or equal to 15 seconds w/LRAD for improved functional mobility and decreased fall risk.  Baseline: 21.54s w/LBQC; 9.38s without AD  Goal status: MET   LONG TERM GOALS:  Target date: 04/09/2022   Pt will improve Berg score to 50/56 for decreased fall risk  Baseline: 41/56 on 4/17; 52/56 on 6/13 Goal status: MET  2.  Pt will improve gait velocity to at least 3.8 ft/s with LRAD for improved gait efficiency and performance at limited community ambulator level   Baseline: 1.4 ft/s w/LBQC; 3.64 ft/s without AD on 5/25; 3.24 ft/s on 6/13 Goal status: NOT MET  3.  Pt will be independent with final HEP for improved strength, balance, transfers and gait.  Baseline:  Goal status: MET  4.  Pt will ascend/descend 12 steps without AD and left rail using step-to pattern mod I to imitate home environment  Baseline: step-to w/LBQC; Mod I w/step-to pattern Goal status: MET  5.  Pt will ambulate 500' without AD mod I for improved functional mobility and independence  Baseline: using [facility] w/S*; 48' no AD on 5/25 mod I; 575' mod I on 6/13  Goal status: MET  6. Pt will improve 5 x STS to less than or equal to 11 seconds with BUE support to demonstrate improved functional strength and transfer efficiency.  Baseline: 16.75s with BUE support; 12.37s without UE support on 5/25; 9.06s without UE support   Goal Status: MET        Plan - 01/18/22 1230     Clinical Impression Statement Emphasis of skilled PT session on LTG assessment and DC from PT. Pt has met 5 of 6 LTGs, not meeting her gait speed goal due to pt  "getting too competitive last time and walking too quickly", so goal was set too high. Pt has significantly improved her gait and balance, obtaining a 52/56 on the Berg and being mod I w/stairs and gait without AD. Pt has been cleared to return to work for light-duty (20 hours/week) and plans on starting this Thursday (6/19). Pt in agreement to DC from PT today and is satisfied with the progress she has made since eval.    Personal Factors and Comorbidities Comorbidity 3+;Profession    Comorbidities see above    Examination-Activity Limitations Bathing;Bed Mobility;Dressing;Locomotion Level;Sit;Squat;Stairs;Stand;Transfers    Examination-Participation Restrictions Community Activity;Driving;Occupation;Meal Prep;Shop;Laundry    Stability/Clinical Decision Making Unstable/Unpredictable    Rehab Potential Good    PT Frequency 2x / week    PT Duration 16 weeks (recert)   PT Treatment/Interventions ADLs/Self Care Home Management;Aquatic Therapy;DME Instruction;Electrical Stimulation;Gait training;Stair training;Functional mobility training;Therapeutic activities;Therapeutic exercise;Balance training;Neuromuscular re-education;Cognitive remediation;Patient/family education;Manual techniques;Dry needling;Vestibular;Passive range of motion    PT Home Exercise Plan [email or web address]    Consulted and Agree with Plan of Care Patient               Cruzita Lederer Alanna Storti, PT, DPT 04/10/2022, 1:21 PM

## 2022-04-12 ENCOUNTER — Encounter: Payer: Self-pay | Admitting: Occupational Therapy

## 2022-04-12 ENCOUNTER — Ambulatory Visit: Payer: No Typology Code available for payment source | Admitting: Physical Therapy

## 2022-04-12 ENCOUNTER — Ambulatory Visit: Payer: No Typology Code available for payment source | Admitting: Occupational Therapy

## 2022-04-12 DIAGNOSIS — M25511 Pain in right shoulder: Secondary | ICD-10-CM | POA: Diagnosis not present

## 2022-04-12 DIAGNOSIS — M6281 Muscle weakness (generalized): Secondary | ICD-10-CM

## 2022-04-12 DIAGNOSIS — M25611 Stiffness of right shoulder, not elsewhere classified: Secondary | ICD-10-CM

## 2022-04-12 NOTE — Therapy (Signed)
[facility] [address] [place], [place], [address] Phone: [phone]   Fax:  [phone]  Occupational Therapy Treatment Patient Details  Name: Alice Williams MRN: [id number] Date of Birth: 1960-12-31 Referring Provider (OT): Dr. Sarina Ill   Encounter Date: 04/12/2022   OT End of Session - 04/12/22 1240     Visit Number 22    Number of Visits 25    Date for OT Re-Evaluation 04/25/22    Authorization Type [employer] 2023  copay $40 per visit  No Ded , VL: [place]  Auth Reqd: after 12th visit.  Additional 10 visits approved per LL.  4 OT visits approved  (6/9-6/16/23)    Authorization - Visit Number 21    Authorization - Number of Visits 22    OT Start Time 1237    OT Stop Time 1315    OT Time Calculation (min) 38 min    Activity Tolerance Patient tolerated treatment well    Behavior During Therapy WFL for tasks assessed/performed               Past Medical History:  Diagnosis Date   Anal fissure    Anemia    in early 20's   Anxiety    Arthritis    back   Breast calcifications on mammogram    Cancer ([place])    pre cancer colon,cervix   Colon polyp    Dyslipidemia 06/24/2018   Elevated cholesterol    Facet arthropathy, lumbosacral 08/28/2017   Family history of adverse reaction to anesthesia    sibling has N/V   GERD (gastroesophageal reflux disease)    Heart murmur    History of colon polyps    History of kidney stones    has small ones   IBS (irritable bowel syndrome)    Inappropriate sinus tachycardia    Migraines    Osteopenia    Osteoporosis of femur without pathological fracture 08/28/2017   Palpitations    PONV (postoperative nausea and vomiting)    Premature surgical menopause    PTSD (post-traumatic stress disorder)    Sleep apnea     Past Surgical History:  Procedure Laterality Date   ANAL FISSURE REPAIR     APPENDECTOMY  2000   COLONOSCOPY W/ POLYPECTOMY  03/2015   COLONOSCOPY WITH  ESOPHAGOGASTRODUODENOSCOPY (EGD)  04/01/2015   KNEE SURGERY     OOPHORECTOMY     OOPHORECTOMY     1995, 1998   ORIF ANKLE FRACTURE Right 11/28/2021   Procedure: OPEN REDUCTION INTERNAL FIXATION (ORIF) ANKLE FRACTURE;  Surgeon: Meredith Pel, MD;  Location: [facility];  Service: Orthopedics;  Laterality: Right;   PELVIC LAPAROSCOPY     x 4 for endometriosis    VAGINAL HYSTERECTOMY  1994    There were no vitals filed for this visit.  SUBJECTIVE:  Pt reports pain isn't bad today.  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  Pt reports that it went well.  PAIN:  Are you having pain? Yes NPRS scale: 1-2/10 at rest, 2-3/10 with movement Pain location: Rt shoulder (proximal, lateral, and down elbow at times) Pain orientation: Right  PAIN TYPE:  pulling Pain description: constant Aggravating factors: movement Relieving factors: proper positioning, wt bearing    TREATMENT:   In supine, light joint mobs to R shoulder and soft tissue mobs to lateral shoulder due to tightness, then PROM R shoulder in flex, abduction, ER, IR, and circumduction in supine.  Supine, AAROM shoulder flex, chest press with PVC frame, high range shoulder ER with BUEs with cane with min cueing for positioning.  Sitting, AAROM shoulder flex, ER, abduction with cane with min cueing/facilitation for positioning.  Light wt. Bearing through hand on mat in abduction with body on arm movements for incr shoulder stability with no pain.  Standing in modified quadruped, forward/backward wt. Shifts and cat/cow positioning for incr scapular mobility with min cueing.  Checked progress towards goals/ROM--see below.    ROM MEASUREMENTS:   04/12/22: A/ROM RT shoulder as follows (seated):  Flex = 117* Abd = 115*   03/22/22: A/ROM RT shoulder as follows (seated):  Flex = 110* Abd = 80*  ER approx 75% IR approx 50%  P/ROM Rt shoulder as follows (seated):  Flex = 130* Abd = 110*   PATIENT  EDUCATION: Education details: Reviewed proper positioning of RUE (avoid elbow flex and IR, and focus on chest up posture)  Person educated: Patient Education method: Explanation, Demonstration, and Verbal cues Education comprehension: verbalized understanding, returned demonstration, verbal cues required  HOME EXERCISE PROGRAM: 02/22/22: Shoulder HEP 03/22/22: Additional HEP      OT Short Term Goals - all STG's have been met      OT SHORT TERM GOAL #1   Title Pt will be independent with initial HEP for strength/activity tolerance.--check STGs 01/23/22    Time 4    Period Weeks    Status Achieved      OT SHORT TERM GOAL #2   Title Pt will be independent with initial HEP for R wrist ROM (once cleared by MD).    Time 4    Period Weeks    Status Achieved      OT SHORT TERM GOAL #3   Title Pt will perform BADLs mod I.    Time 4    Period Weeks    Status Achieved      OT SHORT TERM GOAL #4   Title Pt will perform simple home maintenance/snack prep with supervision.    Time 4    Period Weeks    Status Achieved               OT Long Term Goals - new and ongoing LTG's due 04/25/22      OT LONG TERM GOAL #1   Title Pt will be independent with updated strengthening HEP for bilateral UEs/R wrist (once cleared by MD).--check LTGs 03/25/22    Time 12    Period Weeks    Status MET     OT LONG TERM GOAL #2   Title Pt will perform simple clooking and cleaning tasks mod I.    Time 12    Period Weeks    Status MET except vacuuming     OT LONG TERM GOAL #3   Title Pt will be able to complete IADL task for at least 44mn prior to rest break.    Time 12    Period Weeks    Status MET     OT LONG TERM GOAL #4   Title Pt will demo at least 35lbs R grip strength for lifting/carrying objects and opening containers.    Time 12    Period Weeks    Status On-going   01/29/22: Rt = 19.1 lbs, 02/22/22: Rt = 25.3 lbs  (Lt = 44.7 lbs)     OT LONG TERM GOAL #5   Title Pt will demo at  least 60* wrist flex/ext for ADLs/IADLs.  Time 12    Period Weeks    Status MET  flex = 65*, ext = 60*           NEW LTG's for renewal period:   1) Pt will report pain 3/10 or under with mid to higher level reaching  Baseline: 5-10/10  Status: ongoing 04/12/22:  1-5/10  2) Pt will demo Rt shoulder flexion to 125* or greater to retrieve light weight objects from high shelf, and shoulder abduction to 100* or greater  Baseline: flex = 110*, abd = 80*  Status: ongoing 04/12/22:  Flex = 117*, Abd = 115*        Plan - 03/20/22 1430     Clinical Impression Statement Pt demo improving R shoulder ROM and pain today.   She would benefit from continued occupational therapy for improved RUE functional use, decr pain, and improved ROM.  Pt returned to work in indirect pt care today for 4hrs/day without significant difficulty (except fatigue), but is not cleared for direct pt care at this time.      OT Occupational Profile and History Detailed Assessment- Review of Records and additional review of physical, cognitive, psychosocial history related to current functional performance    Occupational performance deficits (Please refer to evaluation for details): ADL's;IADL's;Work;Leisure    Body Structure / Function / Physical Skills ADL;Decreased knowledge of use of DME;Strength;Balance;UE functional use;ROM;IADL;Endurance;Mobility    Rehab Potential Good    OT Frequency 2x / week    OT Duration 4 more weeks    OT Treatment/Interventions Self-care/ADL training;Moist Heat;Fluidtherapy;DME and/or AE instruction;Splinting;Therapeutic activities;Balance training;Contrast Bath;Ultrasound;Therapeutic exercise;Passive range of motion;Functional Mobility Training;Neuromuscular education;Cryotherapy;Electrical Stimulation;Paraffin;Manual Therapy;Patient/family education;Energy conservation    Plan Continue with joint mobs and P/ROM supine, work in prone as well,  AA/ROM;  Requested additional insurance  visits to cover end of current POC and anticipate d/c at that time.   Consulted and Agree with Plan of Care Patient               Patient will benefit from skilled therapeutic intervention in order to improve the following deficits and impairments:           Visit Diagnosis: Acute pain of right shoulder  Stiffness of right shoulder, not elsewhere classified  Muscle weakness (generalized)    Problem List Patient Active Problem List   Diagnosis Date Noted   Migraine with aura and without status migrainosus, not intractable 02/23/2022   Acute right MCA stroke ([place]) 12/07/2021   GERD without esophagitis 12/07/2021   Right wrist fracture, with routine healing, subsequent encounter 12/07/2021   Bimalleolar ankle fracture, right, closed, with routine healing, subsequent encounter    Blepharospasm 11/08/2021   Sleep disorder, circadian, shift work type 08/01/2021   Excessive daytime sleepiness 08/01/2021   Morning headache 02/16/2021   Left eye pain 02/16/2021   Intractable episodic cluster headache 02/16/2021   Behavioral insomnia of childhood 02/16/2021   Retrognathia 02/16/2021   Chronic migraine without aura, with intractable migraine, so stated, with status migrainosus 01/12/2021   Muscle spasm 10/14/2020   Vitamin D deficiency 08/15/2020   Acute bilateral low back pain with bilateral sciatica 07/13/2020   Gastric polyp 02/11/2019   Sessile colonic polyp 02/11/2019   Chronic fatigue 02/11/2019   Grade I internal hemorrhoids 02/11/2019   Ductal hyperplasia of breast 08/21/2018   Cognitive changes 06/24/2018   Dyslipidemia 06/24/2018   Dense breast tissue on mammogram 04/17/2018   Fibrocystic breast determined by biopsy 04/09/2018   Family history of breast  cancer in mother 04/09/2018   Paroxysmal sinus tachycardia ([facility]) 03/27/2018   Osteoporosis 08/28/2017   Facet arthropathy, lumbosacral 08/28/2017   Chronic left SI joint pain 08/27/2017   Left lumbar  radiculopathy 08/27/2017   Allergic rhinitis 08/09/2017   Disorder of lipoid metabolism 08/09/2017   Insomnia secondary to anxiety 08/09/2017   Postmenopausal osteoporosis 08/09/2017   History of hysterectomy for benign disease 08/09/2017   GAD (generalized anxiety disorder) 08/09/2017   Former smoker, stopped smoking in distant past 08/09/2017   Minor depressive disorder 08/09/2017   Palpitations 03/16/2015   Family history of colon cancer 03/02/2015   Hiatal hernia with GERD without esophagitis 03/02/2015   History of adenomatous polyp of colon 03/02/2015   Chronic migraine without aura 10/10/2014   History of cardiovascular disorder 01/18/2003    [facility], OT 04/12/2022, 2:32 PM  [facility] [address] [place], [place], [address] Phone: [phone]   Fax:  [place], OTR/L [facility] [address] [place], [place]  [address] [phone] phone [phone] 04/12/22 2:32 PM

## 2022-04-13 ENCOUNTER — Other Ambulatory Visit (HOSPITAL_BASED_OUTPATIENT_CLINIC_OR_DEPARTMENT_OTHER): Payer: Self-pay

## 2022-04-13 ENCOUNTER — Other Ambulatory Visit: Payer: Self-pay | Admitting: Medical

## 2022-04-13 MED ORDER — OXYBUTYNIN CHLORIDE ER 5 MG PO TB24
5.0000 mg | ORAL_TABLET | Freq: Every day | ORAL | 1 refills | Status: AC
  Filled 2022-04-13: qty 90, 90d supply, fill #0
  Filled 2022-07-25: qty 90, 90d supply, fill #1

## 2022-04-18 ENCOUNTER — Ambulatory Visit: Payer: No Typology Code available for payment source | Admitting: Occupational Therapy

## 2022-04-18 DIAGNOSIS — I69354 Hemiplegia and hemiparesis following cerebral infarction affecting left non-dominant side: Secondary | ICD-10-CM

## 2022-04-18 DIAGNOSIS — M25511 Pain in right shoulder: Secondary | ICD-10-CM

## 2022-04-18 DIAGNOSIS — M25631 Stiffness of right wrist, not elsewhere classified: Secondary | ICD-10-CM

## 2022-04-18 DIAGNOSIS — M6281 Muscle weakness (generalized): Secondary | ICD-10-CM

## 2022-04-18 DIAGNOSIS — M25611 Stiffness of right shoulder, not elsewhere classified: Secondary | ICD-10-CM

## 2022-04-18 NOTE — Therapy (Deleted)
[facility] [address] [place], [place], [address] Phone: [phone]   Fax:  [phone]  Occupational Therapy Treatment  Patient Details  Name: Alice Williams MRN: [id number] Date of Birth: Nov 21, 1960 Referring Provider (OT): Dr. Sarina Ill   Encounter Date: 04/18/2022    Past Medical History:  Diagnosis Date   Anal fissure    Anemia    in early 20's   Anxiety    Arthritis    back   Breast calcifications on mammogram    Cancer ([place])    pre cancer colon,cervix   Colon polyp    Dyslipidemia 06/24/2018   Elevated cholesterol    Facet arthropathy, lumbosacral 08/28/2017   Family history of adverse reaction to anesthesia    sibling has N/V   GERD (gastroesophageal reflux disease)    Heart murmur    History of colon polyps    History of kidney stones    has small ones   IBS (irritable bowel syndrome)    Inappropriate sinus tachycardia    Migraines    Osteopenia    Osteoporosis of femur without pathological fracture 08/28/2017   Palpitations    PONV (postoperative nausea and vomiting)    Premature surgical menopause    PTSD (post-traumatic stress disorder)    Sleep apnea     Past Surgical History:  Procedure Laterality Date   ANAL FISSURE REPAIR     APPENDECTOMY  2000   COLONOSCOPY W/ POLYPECTOMY  03/2015   COLONOSCOPY WITH ESOPHAGOGASTRODUODENOSCOPY (EGD)  04/01/2015   KNEE SURGERY     OOPHORECTOMY     OOPHORECTOMY     1995, 1998   ORIF ANKLE FRACTURE Right 11/28/2021   Procedure: OPEN REDUCTION INTERNAL FIXATION (ORIF) ANKLE FRACTURE;  Surgeon: Meredith Pel, MD;  Location: [facility];  Service: Orthopedics;  Laterality: Right;   PELVIC LAPAROSCOPY     x 4 for endometriosis    VAGINAL HYSTERECTOMY  1994    There were no vitals filed for this visit.                           OT Short Term Goals - 02/15/22 1111       OT SHORT TERM GOAL #1   Title Pt will be  independent with initial HEP for strength/activity tolerance.--check STGs 01/23/22    Time 4    Period Weeks    Status Achieved      OT SHORT TERM GOAL #2   Title Pt will be independent with initial HEP for R wrist ROM (once cleared by MD).    Time 4    Period Weeks    Status Achieved      OT SHORT TERM GOAL #3   Title Pt will perform BADLs mod I.    Time 4    Period Weeks    Status Achieved      OT SHORT TERM GOAL #4   Title Pt will perform simple home maintenance/snack prep with supervision.    Time 4    Period Weeks    Status Achieved               OT Long Term Goals - 02/27/22 1209       OT LONG TERM GOAL #1   Title Pt will be independent with updated strengthening HEP for bilateral UEs/R wrist (once cleared by MD).--check LTGs 03/25/22    Time 12    Period Weeks  Status On-going      OT LONG TERM GOAL #2   Title Pt will perform simple clooking and cleaning tasks mod I.    Time 12    Period Weeks    Status On-going   02/27/22:  needs assist/difficulty for heavier pots/pans, chopping, high reaching     OT LONG TERM GOAL #3   Title Pt will be able to complete IADL task for at least 2mn prior to rest break.    Time 12    Period Weeks    Status On-going   02/15/22:  10-229m     OT LONG TERM GOAL #4   Title Pt will demo at least 35lbs R grip strength for lifting/carrying objects and opening containers.    Time 12    Period Weeks    Status On-going   01/29/22: Rt = 19.1 lbs, 02/22/22: Rt = 25.3 lbs  (Lt = 44.7 lbs)     OT LONG TERM GOAL #5   Title Pt will demo at least 60* wrist flex/ext for ADLs/IADLs.    Time 12    Period Weeks    Status On-going   flex = 65*, ext = 60*; 02/22/22: flex = 70*, ext = 55*                   Patient will benefit from skilled therapeutic intervention in order to improve the following deficits and impairments:           Visit Diagnosis: No diagnosis found.    Problem List Patient Active Problem List    Diagnosis Date Noted   Migraine with aura and without status migrainosus, not intractable 02/23/2022   Acute right MCA stroke ([facility]02/06/2022   GERD without esophagitis 12/07/2021   Right wrist fracture, with routine healing, subsequent encounter 12/07/2021   Bimalleolar ankle fracture, right, closed, with routine healing, subsequent encounter    Blepharospasm 11/08/2021   Sleep disorder, circadian, shift work type 08/01/2021   Excessive daytime sleepiness 08/01/2021   Morning headache 02/16/2021   Left eye pain 02/16/2021   Intractable episodic cluster headache 02/16/2021   Behavioral insomnia of childhood 02/16/2021   Retrognathia 02/16/2021   Chronic migraine without aura, with intractable migraine, so stated, with status migrainosus 01/12/2021   Muscle spasm 10/14/2020   Vitamin D deficiency 08/15/2020   Acute bilateral low back pain with bilateral sciatica 07/13/2020   Gastric polyp 02/11/2019   Sessile colonic polyp 02/11/2019   Chronic fatigue 02/11/2019   Grade I internal hemorrhoids 02/11/2019   Ductal hyperplasia of breast 08/21/2018   Cognitive changes 06/24/2018   Dyslipidemia 06/24/2018   Dense breast tissue on mammogram 04/17/2018   Fibrocystic breast determined by biopsy 04/09/2018   Family history of breast cancer in mother 04/09/2018   Paroxysmal sinus tachycardia ([facility]05/30/2019   Osteoporosis 08/28/2017   Facet arthropathy, lumbosacral 08/28/2017   Chronic left SI joint pain 08/27/2017   Left lumbar radiculopathy 08/27/2017   Allergic rhinitis 08/09/2017   Disorder of lipoid metabolism 08/09/2017   Insomnia secondary to anxiety 08/09/2017   Postmenopausal osteoporosis 08/09/2017   History of hysterectomy for benign disease 08/09/2017   GAD (generalized anxiety disorder) 08/09/2017   Former smoker, stopped smoking in distant past 08/09/2017   Minor depressive disorder 08/09/2017   Palpitations 03/16/2015   Family history of colon cancer 03/02/2015    Hiatal hernia with GERD without esophagitis 03/02/2015   History of adenomatous polyp of colon 03/02/2015   Chronic migraine without aura 10/10/2014  History of cardiovascular disorder 01/18/2003    Camera Krienke, OT 04/18/2022, 2:19 PM  [facility] [address] [place], [place], [address] Phone: [phone]   Fax:  [phone]  Name: Alice Williams MRN: [id number] Date of Birth: 02-Sep-1961

## 2022-04-18 NOTE — Therapy (Signed)
[facility] [address] [place], [place], [address] Phone: [phone]   Fax:  [phone]  Occupational Therapy Treatment/ Discharge summary Patient Details  Name: Alice Williams MRN: [id number] Date of Birth: [birth year] Referring Provider (OT): Dr. Sarina Ill   Encounter Date: 04/18/2022      Current functional level related to goals / functional outcomes: Pt made good overall progress. She has returned to work and living independently.See goals for progress.   Remaining deficits: Decreased ROM,decreased strength, pain   Education / Equipment: Pt was educated in [employer], safety for ADLS/IADLs. She demonstrates understanding.   Patient agrees to discharge. Patient goals were partially met. Patient is being discharged due to being pleased with the current functional level..      OT End of Session - 04/18/22 1425     Visit Number 23    Number of Visits 25    Date for OT Re-Evaluation 04/25/22    Authorization Type [employer] 2023  copay $40 per visit  No Ded , VL: [place]  Auth Reqd: after 12th visit.  Additional 10 visits approved per LL.  4 OT visits approved  (6/9-6/16/23)    Authorization - Visit Number 23    OT Start Time 5329    OT Stop Time 1450    OT Time Calculation (min) 43 min                Past Medical History:  Diagnosis Date   Anal fissure    Anemia    in early 20's   Anxiety    Arthritis    back   Breast calcifications on mammogram    Cancer ([place])    pre cancer colon,cervix   Colon polyp    Dyslipidemia 06/24/2018   Elevated cholesterol    Facet arthropathy, lumbosacral 08/28/2017   Family history of adverse reaction to anesthesia    sibling has N/V   GERD (gastroesophageal reflux disease)    Heart murmur    History of colon polyps    History of kidney stones    has small ones   IBS (irritable bowel syndrome)    Inappropriate sinus tachycardia    Migraines    Osteopenia     Osteoporosis of femur without pathological fracture 08/28/2017   Palpitations    PONV (postoperative nausea and vomiting)    Premature surgical menopause    PTSD (post-traumatic stress disorder)    Sleep apnea     Past Surgical History:  Procedure Laterality Date   ANAL FISSURE REPAIR     APPENDECTOMY  2000   COLONOSCOPY W/ POLYPECTOMY  03/2015   COLONOSCOPY WITH ESOPHAGOGASTRODUODENOSCOPY (EGD)  04/01/2015   KNEE SURGERY     OOPHORECTOMY     OOPHORECTOMY     1995, 1998   ORIF ANKLE FRACTURE Right 11/28/2021   Procedure: OPEN REDUCTION INTERNAL FIXATION (ORIF) ANKLE FRACTURE;  Surgeon: Meredith Pel, MD;  Location: [facility];  Service: Orthopedics;  Laterality: Right;   PELVIC LAPAROSCOPY     x 4 for endometriosis    VAGINAL HYSTERECTOMY  1994    There were no vitals filed for this visit.  SUBJECTIVE: Pt reports work is going well  PAIN:  Are you having pain? Yes NPRS scale: 1-2/10 at rest, 3-4 with  end range movement Pain location: Rt shoulder (proximal, lateral, and down elbow at times) Pain orientation: Right  PAIN TYPE:  pulling Pain description: constant Aggravating factors: movement Relieving factors: proper positioning, wt bearing  TREATMENT:   In supine, light joint mobs to R shoulder and soft tissue mobs to lateral shoulder due to tightness, then PROM R shoulder in flex,  A/ROM circumduction in supine.    Supine, AROM shoulder flex, chest press with PVC frame, with  BUEs   Wall slides x 10 reps with RUE, wall push ups x 10 reps min v.c for positioning   modified quadruped, forward/backward wt. Shifts and cat/cow positioning for incr scapular mobility with min cueing.  Therapist checked progress towards goals and discussed plans for d/c      ROM MEASUREMENTS:   6/21/23A/ROM RT shoulder as follows (seated):  Flex = 125*after stretching Abd = 110*  04/12/22: A/ROM RT shoulder as follows (seated):  Flex = 117* Abd = 115*   03/22/22: A/ROM RT  shoulder as follows (seated):  Flex = 110* Abd = 80*  ER approx 75% IR approx 50%  P/ROM Rt shoulder as follows (seated):  Flex = 130* Abd = 110*   PATIENT EDUCATION: Education details: progress towards goals Person educated: Patient Education method: Veterinary surgeon Education comprehension: verbalized understanding,   HOME EXERCISE PROGRAM: 02/22/22: Shoulder HEP 03/22/22: Additional HEP      OT Short Term Goals - all STG's have been met      OT SHORT TERM GOAL #1   Title Pt will be independent with initial HEP for strength/activity tolerance.--check STGs 01/23/22    Time 4    Period Weeks    Status Achieved      OT SHORT TERM GOAL #2   Title Pt will be independent with initial HEP for R wrist ROM (once cleared by MD).    Time 4    Period Weeks    Status Achieved      OT SHORT TERM GOAL #3   Title Pt will perform BADLs mod I.    Time 4    Period Weeks    Status Achieved      OT SHORT TERM GOAL #4   Title Pt will perform simple home maintenance/snack prep with supervision.    Time 4    Period Weeks    Status Achieved               OT Long Term Goals - new and ongoing LTG's due 04/25/22      OT LONG TERM GOAL #1   Title Pt will be independent with updated strengthening HEP for bilateral UEs/R wrist (once cleared by MD).--check LTGs 03/25/22    Time 12    Period Weeks    Status MET     OT LONG TERM GOAL #2   Title Pt will perform simple clooking and cleaning tasks mod I.    Time 12    Period Weeks    Status MET except vacuuming     OT LONG TERM GOAL #3   Title Pt will be able to complete IADL task for at least 57mn prior to rest break.    Time 12    Period Weeks    Status MET     OT LONG TERM GOAL #4   Title Pt will demo at least 35lbs R grip strength for lifting/carrying objects and opening containers.    Time 12    Period Weeks    Status MET  35 lbs     OT LONG TERM GOAL #5   Title Pt will demo at least 60* wrist flex/ext for  ADLs/IADLs.    Time 12    Period  Weeks    Status MET  flex = 65*, ext = 60*           NEW LTG's for renewal period:   1) Pt will report pain 3/10 or under with mid to higher level reaching  Baseline: 5-10/10  Status: not met,  pain 3-4/10 mid- high level reach  2) Pt will demo Rt shoulder flexion to 125* or greater to retrieve light weight objects from high shelf, and shoulder abduction to 100* or greater    Status: Met 04/12/22:  Flex =125 after stretching  , Abd = 110       Plan - 03/20/22 1430     Clinical impression: Pt demonstrates excellent progress. Se agrees with plans for d/c. 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   OT Occupational Profile and History Detailed Assessment- Review of Records and additional review of physical, cognitive, psychosocial history related to current functional performance    Occupational performance deficits (Please refer to evaluation for details): ADL's;IADL's;Work;Leisure    Body Structure / Function / Physical Skills ADL;Decreased knowledge of use of DME;Strength;Balance;UE functional use;ROM;IADL;Endurance;Mobility    Rehab Potential Good    OT Frequency 2x / week    OT Duration 4 more weeks    OT Treatment/Interventions Self-care/ADL training;Moist Heat;Fluidtherapy;DME and/or AE instruction;Splinting;Therapeutic activities;Balance training;Contrast Bath;Ultrasound;Therapeutic exercise;Passive range of motion;Functional Mobility Training;Neuromuscular education;Cryotherapy;Electrical Stimulation;Paraffin;Manual Therapy;Patient/family education;Energy conservation    Plan D/C OT   Consulted and Agree with Plan of Care Patient               Patient will benefit from skilled therapeutic intervention in order to improve the following deficits and impairments:  ROM , strength, UE functional use         Visit Diagnosis: Acute pain of right shoulder  Stiffness of right shoulder, not elsewhere  classified  Muscle weakness (generalized)  Hemiplegia and hemiparesis following cerebral infarction affecting left non-dominant side (HCC)  Stiffness of right wrist, not elsewhere classified    Problem List Patient Active Problem List   Diagnosis Date Noted   Migraine with aura and without status migrainosus, not intractable 02/23/2022   Acute right MCA stroke ([place]) 12/07/2021   GERD without esophagitis 12/07/2021   Right wrist fracture, with routine healing, subsequent encounter 12/07/2021   Bimalleolar ankle fracture, right, closed, with routine healing, subsequent encounter    Blepharospasm 11/08/2021   Sleep disorder, circadian, shift work type 08/01/2021   Excessive daytime sleepiness 08/01/2021   Morning headache 02/16/2021   Left eye pain 02/16/2021   Intractable episodic cluster headache 02/16/2021   Behavioral insomnia of childhood 02/16/2021   Retrognathia 02/16/2021   Chronic migraine without aura, with intractable migraine, so stated, with status migrainosus 01/12/2021   Muscle spasm 10/14/2020   Vitamin D deficiency 08/15/2020   Acute bilateral low back pain with bilateral sciatica 07/13/2020   Gastric polyp 02/11/2019   Sessile colonic polyp 02/11/2019   Chronic fatigue 02/11/2019   Grade I internal hemorrhoids 02/11/2019   Ductal hyperplasia of breast 08/21/2018   Cognitive changes 06/24/2018   Dyslipidemia 06/24/2018   Dense breast tissue on mammogram 04/17/2018   Fibrocystic breast determined by biopsy 04/09/2018   Family history of breast cancer in mother 04/09/2018   Paroxysmal sinus tachycardia ([place]) 03/27/2018   Osteoporosis 08/28/2017   Facet arthropathy, lumbosacral 08/28/2017   Chronic left SI joint pain 08/27/2017   Left lumbar radiculopathy 08/27/2017   Allergic rhinitis 08/09/2017   Disorder of lipoid metabolism  08/09/2017   Insomnia secondary to anxiety 08/09/2017   Postmenopausal osteoporosis 08/09/2017   History of hysterectomy for benign  disease 08/09/2017   GAD (generalized anxiety disorder) 08/09/2017   Former smoker, stopped smoking in distant past 08/09/2017   Minor depressive disorder 08/09/2017   Palpitations 03/16/2015   Family history of colon cancer 03/02/2015   Hiatal hernia with GERD without esophagitis 03/02/2015   History of adenomatous polyp of colon 03/02/2015   Chronic migraine without aura 10/10/2014   History of cardiovascular disorder 01/18/2003    Carrina Schoenberger, OT 04/18/2022, 3:14 PM  [facility] [address] [place], [place], [address] Phone: [phone]   Fax:  [phone]  04/18/22 3:14 PM

## 2022-04-19 ENCOUNTER — Telehealth: Payer: Self-pay | Admitting: *Deleted

## 2022-04-19 DIAGNOSIS — G43711 Chronic migraine without aura, intractable, with status migrainosus: Secondary | ICD-10-CM

## 2022-04-19 NOTE — Telephone Encounter (Signed)
Alice Williams [place]: [facility] - PA Case ID: [id number]   PA Qulipta complete Waiting on approval

## 2022-04-19 NOTE — Telephone Encounter (Signed)
Approvedtoday The request has been approved. The authorization is effective for a maximum of 6 fills from 04/19/2022 to 10/18/2022, as long as the member is enrolled in their current health plan. The request was approved with a quantity restriction. This has been approved for a max daily dosage of 1. A written notification letter will follow with additional details.      Will contact patient in mychart to make her aware of approval

## 2022-04-20 ENCOUNTER — Ambulatory Visit: Payer: No Typology Code available for payment source | Admitting: Occupational Therapy

## 2022-04-20 ENCOUNTER — Other Ambulatory Visit (HOSPITAL_BASED_OUTPATIENT_CLINIC_OR_DEPARTMENT_OTHER): Payer: Self-pay

## 2022-04-20 MED ORDER — ATOGEPANT 60 MG PO TABS
60.0000 mg | ORAL_TABLET | Freq: Every day | ORAL | 6 refills | Status: DC
  Filled 2022-04-20 – 2022-04-23 (×2): qty 30, 30d supply, fill #0
  Filled 2022-06-07: qty 30, 30d supply, fill #1
  Filled 2022-07-25: qty 30, 30d supply, fill #2

## 2022-04-23 ENCOUNTER — Other Ambulatory Visit (HOSPITAL_BASED_OUTPATIENT_CLINIC_OR_DEPARTMENT_OTHER): Payer: Self-pay

## 2022-04-24 ENCOUNTER — Encounter: Payer: No Typology Code available for payment source | Admitting: Occupational Therapy

## 2022-04-26 ENCOUNTER — Encounter: Payer: No Typology Code available for payment source | Admitting: Occupational Therapy

## 2022-04-27 ENCOUNTER — Other Ambulatory Visit (HOSPITAL_BASED_OUTPATIENT_CLINIC_OR_DEPARTMENT_OTHER): Payer: Self-pay

## 2022-05-03 ENCOUNTER — Other Ambulatory Visit (HOSPITAL_COMMUNITY): Payer: Self-pay

## 2022-05-04 ENCOUNTER — Ambulatory Visit (INDEPENDENT_AMBULATORY_CARE_PROVIDER_SITE_OTHER): Payer: No Typology Code available for payment source | Admitting: Surgical

## 2022-05-04 ENCOUNTER — Encounter: Payer: Self-pay | Admitting: Orthopedic Surgery

## 2022-05-04 ENCOUNTER — Ambulatory Visit (INDEPENDENT_AMBULATORY_CARE_PROVIDER_SITE_OTHER): Payer: No Typology Code available for payment source

## 2022-05-04 ENCOUNTER — Ambulatory Visit: Payer: Self-pay

## 2022-05-04 DIAGNOSIS — M7501 Adhesive capsulitis of right shoulder: Secondary | ICD-10-CM | POA: Diagnosis not present

## 2022-05-04 MED ORDER — METHYLPREDNISOLONE ACETATE 40 MG/ML IJ SUSP
40.0000 mg | INTRAMUSCULAR | Status: AC | PRN
  Administered 2022-05-04: 40 mg via INTRA_ARTICULAR

## 2022-05-04 MED ORDER — LIDOCAINE HCL 1 % IJ SOLN
5.0000 mL | INTRAMUSCULAR | Status: AC | PRN
  Administered 2022-05-04: 5 mL

## 2022-05-04 MED ORDER — BUPIVACAINE HCL 0.5 % IJ SOLN
9.0000 mL | INTRAMUSCULAR | Status: AC | PRN
  Administered 2022-05-04: 9 mL via INTRA_ARTICULAR

## 2022-05-04 NOTE — Progress Notes (Signed)
Office Visit Note   Patient: Alice Williams           Date of Birth: [birth year]           MRN: [id number] Visit Date: 05/04/2022 Requested by: Mackie Pai, PA-C [place],  [place] [address] PCP: Elise Benne  Subjective: Chief Complaint  Patient presents with   Right Shoulder - Follow-up    HPI: Alice Williams is a 61 y.o. female who presents to the office complaining of continued right shoulder pain.  Patient had right glenohumeral injection on 03/21/2022 for adhesive capsulitis.  She still notes some continued soreness and did have some mild relief from the injection in regards to pain but mostly it helped with her range of motion.  She has continued to work on range of motion with home exercises.  Cannot lay on her right shoulder yet.  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  Right ankle is doing well and she has no significant complaints aside from very occasional swelling in the right foot.  No pain and no limp..                ROS: All systems reviewed are negative as they relate to the chief complaint within the history of present illness.  Patient denies fevers or chills.  Assessment & Plan: Visit Diagnoses:  1. Adhesive capsulitis of right shoulder     Plan: Patient is a 61 year old female who presents for evaluation of right shoulder pain.  She has right shoulder radiographs taken today demonstrating no significant degenerative changes of the right shoulder joint.  She has been dealing with right shoulder adhesive capsulitis with loss of passive motion.  She states that pain is getting better and motion is improving as she is working on it with home exercises.  She would like to try 1 more glenohumeral injection.  Ultrasound-guided glenohumeral injection successfully administered.  Patient tolerated procedure well.  Follow-up with the office in 6 weeks for clinical recheck.  Work note provided for  her to return to full duty.  Follow-Up Instructions: No follow-ups on file.   Orders:  Orders Placed This Encounter  Procedures   XR Shoulder Right   US Guided Needle Placement - No Linked Charges   No orders of the defined types were placed in this encounter.     Procedures: Large Joint Inj: R glenohumeral on 05/04/2022 12:13 PM Indications: diagnostic evaluation and pain Details: 22 G 3.5 in needle, ultrasound-guided posterior approach  Arthrogram: No  Medications: 9 mL bupivacaine 0.5 %; 40 mg methylPREDNISolone acetate 40 MG/ML; 5 mL lidocaine 1 % Outcome: tolerated well, no immediate complications Procedure, treatment alternatives, risks and benefits explained, specific risks discussed. Consent was given by the patient. Immediately prior to procedure a time out was called to verify the correct patient, procedure, equipment, support staff and site/side marked as required. Patient was prepped and draped in the usual sterile fashion.       Clinical Data: No additional findings.  Objective: Vital Signs: There were no vitals taken for this visit.  Physical Exam:  Constitutional: Patient appears well-developed HEENT:  Head: Normocephalic Eyes:EOM are normal Neck: Normal range of motion Cardiovascular: Normal rate Pulmonary/chest: Effort normal Neurologic: Patient is alert Skin: Skin is warm Psychiatric: Patient has normal mood and affect  Ortho Exam: Ortho exam demonstrates right shoulder with 45 degrees external rotation, 80 degrees abduction, 145 degrees  forward flexion.  Excellent rotator cuff strength of supra, infra, subscap.  Negative belly press test.  Moderate tenderness over the bicipital groove.  No tenderness over the [facility] joint.  Intact EPL, FPL, finger abduction, finger adduction.  Specialty Comments:  No specialty comments available.  Imaging: No results found.   PMFS History: Patient Active Problem List   Diagnosis Date Noted   Migraine with aura  and without status migrainosus, not intractable 02/23/2022   Acute right MCA stroke ([place]) 12/07/2021   GERD without esophagitis 12/07/2021   Right wrist fracture, with routine healing, subsequent encounter 12/07/2021   Bimalleolar ankle fracture, right, closed, with routine healing, subsequent encounter    Blepharospasm 11/08/2021   Sleep disorder, circadian, shift work type 08/01/2021   Excessive daytime sleepiness 08/01/2021   Morning headache 02/16/2021   Left eye pain 02/16/2021   Intractable episodic cluster headache 02/16/2021   Behavioral insomnia of childhood 02/16/2021   Retrognathia 02/16/2021   Chronic migraine without aura, with intractable migraine, so stated, with status migrainosus 01/12/2021   Muscle spasm 10/14/2020   Vitamin D deficiency 08/15/2020   Acute bilateral low back pain with bilateral sciatica 07/13/2020   Gastric polyp 02/11/2019   Sessile colonic polyp 02/11/2019   Chronic fatigue 02/11/2019   Grade I internal hemorrhoids 02/11/2019   Ductal hyperplasia of breast 08/21/2018   Cognitive changes 06/24/2018   Dyslipidemia 06/24/2018   Dense breast tissue on mammogram 04/17/2018   Fibrocystic breast determined by biopsy 04/09/2018   Family history of breast cancer in mother 04/09/2018   Paroxysmal sinus tachycardia ([place]) 03/27/2018   Osteoporosis 08/28/2017   Facet arthropathy, lumbosacral 08/28/2017   Chronic left SI joint pain 08/27/2017   Left lumbar radiculopathy 08/27/2017   Allergic rhinitis 08/09/2017   Disorder of lipoid metabolism 08/09/2017   Insomnia secondary to anxiety 08/09/2017   Postmenopausal osteoporosis 08/09/2017   History of hysterectomy for benign disease 08/09/2017   GAD (generalized anxiety disorder) 08/09/2017   Former smoker, stopped smoking in distant past 08/09/2017   Minor depressive disorder 08/09/2017   Palpitations 03/16/2015   Family history of colon cancer 03/02/2015   Hiatal hernia with GERD without esophagitis  03/02/2015   History of adenomatous polyp of colon 03/02/2015   Chronic migraine without aura 10/10/2014   History of cardiovascular disorder 01/18/2003   Past Medical History:  Diagnosis Date   Anal fissure    Anemia    in early 20's   Anxiety    Arthritis    back   Breast calcifications on mammogram    Cancer ([place])    pre cancer colon,cervix   Colon polyp    Dyslipidemia 06/24/2018   Elevated cholesterol    Facet arthropathy, lumbosacral 08/28/2017   Family history of adverse reaction to anesthesia    sibling has N/V   GERD (gastroesophageal reflux disease)    Heart murmur    History of colon polyps    History of kidney stones    has small ones   IBS (irritable bowel syndrome)    Inappropriate sinus tachycardia    Migraines    Osteopenia    Osteoporosis of femur without pathological fracture 08/28/2017   Palpitations    PONV (postoperative nausea and vomiting)    Premature surgical menopause    PTSD (post-traumatic stress disorder)    Sleep apnea     Family History  Problem Relation Age of Onset   Colon cancer Mother    Breast cancer Mother 69  and liver   Migraines Mother    Hyperlipidemia Father    Alcohol abuse Father    Heart attack Father    Cancer Father        Head and neck    Kidney cancer Sister 49   Migraines Sister    Migraines Sister    Hyperlipidemia Brother    Lung cancer Maternal Grandmother    Parkinson's disease Maternal Grandfather    Dementia Maternal Grandfather    Prostate cancer Maternal Grandfather    Parkinson's disease Paternal Grandmother    Heart attack Paternal Grandfather    Esophageal cancer Neg Hx    Stroke Neg Hx     Past Surgical History:  Procedure Laterality Date   ANAL FISSURE REPAIR     APPENDECTOMY  2000   COLONOSCOPY W/ POLYPECTOMY  03/2015   COLONOSCOPY WITH ESOPHAGOGASTRODUODENOSCOPY (EGD)  04/01/2015   KNEE SURGERY     OOPHORECTOMY     OOPHORECTOMY     1995, 1998   ORIF ANKLE FRACTURE Right  11/28/2021   Procedure: OPEN REDUCTION INTERNAL FIXATION (ORIF) ANKLE FRACTURE;  Surgeon: Meredith Pel, MD;  Location: [place];  Service: Orthopedics;  Laterality: Right;   PELVIC LAPAROSCOPY     x 4 for endometriosis    VAGINAL HYSTERECTOMY  1994   Social History   Occupational History   Occupation: Therapist, sports  Tobacco Use   Smoking status: Former    Types: Cigarettes    Quit date: 08/09/2006    Years since quitting: 15.7   Smokeless tobacco: Never  Vaping Use   Vaping Use: Never used  Substance and Sexual Activity   Alcohol use: Not Currently    Comment: rare   Drug use: No   Sexual activity: Never    Birth control/protection: Surgical

## 2022-05-07 ENCOUNTER — Other Ambulatory Visit (HOSPITAL_COMMUNITY): Payer: Self-pay

## 2022-05-10 ENCOUNTER — Other Ambulatory Visit (HOSPITAL_COMMUNITY): Payer: Self-pay

## 2022-05-14 ENCOUNTER — Ambulatory Visit (INDEPENDENT_AMBULATORY_CARE_PROVIDER_SITE_OTHER): Payer: No Typology Code available for payment source | Admitting: Medical

## 2022-05-14 ENCOUNTER — Other Ambulatory Visit (HOSPITAL_BASED_OUTPATIENT_CLINIC_OR_DEPARTMENT_OTHER): Payer: Self-pay

## 2022-05-14 VITALS — BP 109/64 | HR 67 | Temp 97.9°F | Resp 18 | Ht 67.0 in | Wt 120.4 lb

## 2022-05-14 DIAGNOSIS — R634 Abnormal weight loss: Secondary | ICD-10-CM

## 2022-05-14 DIAGNOSIS — G629 Polyneuropathy, unspecified: Secondary | ICD-10-CM

## 2022-05-14 DIAGNOSIS — M2550 Pain in unspecified joint: Secondary | ICD-10-CM | POA: Diagnosis not present

## 2022-05-14 DIAGNOSIS — D125 Benign neoplasm of sigmoid colon: Secondary | ICD-10-CM

## 2022-05-14 DIAGNOSIS — F411 Generalized anxiety disorder: Secondary | ICD-10-CM

## 2022-05-14 DIAGNOSIS — Z8673 Personal history of transient ischemic attack (TIA), and cerebral infarction without residual deficits: Secondary | ICD-10-CM | POA: Diagnosis not present

## 2022-05-14 DIAGNOSIS — R350 Frequency of micturition: Secondary | ICD-10-CM

## 2022-05-14 DIAGNOSIS — E538 Deficiency of other specified B group vitamins: Secondary | ICD-10-CM

## 2022-05-14 DIAGNOSIS — Z800 Family history of malignant neoplasm of digestive organs: Secondary | ICD-10-CM

## 2022-05-14 DIAGNOSIS — N3941 Urge incontinence: Secondary | ICD-10-CM | POA: Diagnosis not present

## 2022-05-14 LAB — CBC WITH DIFFERENTIAL/PLATELET
Basophils Absolute: 0.1 10*3/uL (ref 0.0–0.1)
Basophils Relative: 0.9 % (ref 0.0–3.0)
Eosinophils Absolute: 0.2 10*3/uL (ref 0.0–0.7)
Eosinophils Relative: 2.7 % (ref 0.0–5.0)
HCT: 34.8 % — ABNORMAL LOW (ref 36.0–46.0)
Hemoglobin: 12 g/dL (ref 12.0–15.0)
Lymphocytes Relative: 20 % (ref 12.0–46.0)
Lymphs Abs: 1.5 10*3/uL (ref 0.7–4.0)
MCHC: 34.6 g/dL (ref 30.0–36.0)
MCV: 90.8 fl (ref 78.0–100.0)
Monocytes Absolute: 0.5 10*3/uL (ref 0.1–1.0)
Monocytes Relative: 6.7 % (ref 3.0–12.0)
Neutro Abs: 5.3 10*3/uL (ref 1.4–7.7)
Neutrophils Relative %: 69.7 % (ref 43.0–77.0)
Platelets: 202 10*3/uL (ref 150.0–400.0)
RBC: 3.84 Mil/uL — ABNORMAL LOW (ref 3.87–5.11)
RDW: 13 % (ref 11.5–15.5)
WBC: 7.5 10*3/uL (ref 4.0–10.5)

## 2022-05-14 LAB — COMPREHENSIVE METABOLIC PANEL
ALT: 16 U/L (ref 0–35)
AST: 16 U/L (ref 0–37)
Albumin: 4.3 g/dL (ref 3.5–5.2)
Alkaline Phosphatase: 62 U/L (ref 39–117)
BUN: 19 mg/dL (ref 6–23)
CO2: 26 mEq/L (ref 19–32)
Calcium: 9.3 mg/dL (ref 8.4–10.5)
Chloride: 109 mEq/L (ref 96–112)
Creatinine, Ser: 0.71 mg/dL (ref 0.40–1.20)
GFR: 92.38 mL/min (ref >60.00)
Glucose, Bld: 92 mg/dL (ref 70–99)
Potassium: 3.7 mEq/L (ref 3.5–5.1)
Sodium: 143 mEq/L (ref 135–145)
Total Bilirubin: 0.4 mg/dL (ref 0.2–1.2)
Total Protein: 6.2 g/dL (ref 6.0–8.3)

## 2022-05-14 LAB — POCT URINALYSIS DIPSTICK
Bilirubin, UA: NEGATIVE
Blood, UA: NEGATIVE
Glucose, UA: NEGATIVE
Ketones, UA: NEGATIVE
Leukocytes, UA: NEGATIVE
Nitrite, UA: NEGATIVE
Protein, UA: NEGATIVE
Spec Grav, UA: 1.01 (ref 1.010–1.025)
Urobilinogen, UA: 0.2 E.U./dL
pH, UA: 5 (ref 5.0–8.0)

## 2022-05-14 LAB — VITAMIN B12: Vitamin B-12: 1404 pg/mL — ABNORMAL HIGH (ref 211–911)

## 2022-05-14 NOTE — Patient Instructions (Addendum)
For rt shoulder pain continue to follow up with Dr. Marlou Sa.  For joint pains continue to combine Advil and low dose tylenol pending rheumatology referral.  Anxiety and mood stable.  Continue Lexapro.  For history of stroke continue on aspirin 81 mg daily.  Follow-up with neurologist as scheduled.  For urge incontinence continue with detrol. Glad to hear this is helping.  For rt foot neuropathy and low b12 hx will get b12 and b1 level. No obvious edema on foot exam. If foot pain on walking or more perceived edema can get xray.  As part of your work up for weight loss did go ahead and place referral to gi MD for repeat colonoscopy.  For hx of urge incontinence and frequent urination we got ua today.  Follow up in 3 months or sooner if needed.

## 2022-05-14 NOTE — Progress Notes (Signed)
Subjective:    Patient ID: Alice Williams, female    DOB: September 13, 1961, 61 y.o.   MRN: [id number]  HPI Pt updates me that her joint pain are the same.   Pt has seen Dr. Marlou Sa for rt shoulder pain. She got injections and has better range of motion.  Still having bilateral hip and knee pains. In past stopped atorvastatin for more than 2 weeks and did not make difference. Pt ana was elevated and did refer to rheumatologist. She has appt on June 26, 2022. Pt taking advil for the pain.  Anxiety and mood stable.  Continue Lexapro.  For history of stroke continue on aspirin 81 mg daily.  Follow-up with neurologist as scheduled.  Pt on ditropan and she states helping a lot with urge incontinence.  Rt foot atypical tingling daily and sensation of swelling in mid foot. This has been the case months ago after dislocated foot at talofibular ligament area.  Some unintential wt loss. She eats 1-2 times a day. She has decreased appetitie. Worse appetitie loss in past 2 months.  Up to date on mammogram.  Las colonoscopy 02-10-2019. Still has some diarrhea. Report on last colonoscopy states repeat in 3-5 years. 2 weeks ago vomited one time and states looks green. No abdomen pain. Hx of gastric polyp on egd in past.  Hysterecomy age [age] yo.  Hx of smoking long time ago. Smoked 12 years 3/4 of pack a days.  Review of Systems  Constitutional:  Negative for chills, fatigue and fever.  HENT:  Negative for congestion and dental problem.   Respiratory:  Negative for cough, chest tightness, shortness of breath and wheezing.   Cardiovascular:  Negative for chest pain and palpitations.  Gastrointestinal:  Negative for abdominal pain.  Genitourinary:  Negative for difficulty urinating and dysuria.  Musculoskeletal:  Negative for back pain.  Skin:  Negative for rash.  Neurological:  Negative for dizziness, facial asymmetry, speech difficulty, weakness and light-headedness.  Hematological:  Negative for  adenopathy. Does not bruise/bleed easily.  Psychiatric/Behavioral:  Negative for behavioral problems, decreased concentration and dysphoric mood. The patient is not nervous/anxious.     Past Medical History:  Diagnosis Date   Anal fissure    Anemia    in early 20's   Anxiety    Arthritis    back   Breast calcifications on mammogram    Cancer ([place])    pre cancer colon,cervix   Colon polyp    Dyslipidemia 06/24/2018   Elevated cholesterol    Facet arthropathy, lumbosacral 08/28/2017   Family history of adverse reaction to anesthesia    sibling has N/V   GERD (gastroesophageal reflux disease)    Heart murmur    History of colon polyps    History of kidney stones    has small ones   IBS (irritable bowel syndrome)    Inappropriate sinus tachycardia    Migraines    Osteopenia    Osteoporosis of femur without pathological fracture 08/28/2017   Palpitations    PONV (postoperative nausea and vomiting)    Premature surgical menopause    PTSD (post-traumatic stress disorder)    Sleep apnea      Social History   Socioeconomic History   Marital status: Divorced    Spouse name: Not on file   Number of children: 1   Years of education: Not on file   Highest education level: Master's degree (e.g., MA, MS, MEng, MEd, MSW, MBA)  Occupational History   Occupation: Therapist, sports  Tobacco Use   Smoking status: Former    Types: Cigarettes    Quit date: 08/09/2006    Years since quitting: 15.7   Smokeless tobacco: Never  Vaping Use   Vaping Use: Never used  Substance and Sexual Activity   Alcohol use: Not Currently    Comment: rare   Drug use: No   Sexual activity: Never    Birth control/protection: Surgical  Other Topics Concern   Not on file  Social History Narrative   Single, livevs alone in a 2 story house. Drinks two sodas a day. Does not exercise regularly.   Social Determinants of Health   Financial Resource Strain: Not on file  Food Insecurity: Not on file  Transportation  Needs: Not on file  Physical Activity: Not on file  Stress: Not on file  Social Connections: Not on file  Intimate Partner Violence: Not on file    Past Surgical History:  Procedure Laterality Date   ANAL FISSURE REPAIR     APPENDECTOMY  2000   COLONOSCOPY W/ POLYPECTOMY  03/2015   COLONOSCOPY WITH ESOPHAGOGASTRODUODENOSCOPY (EGD)  04/01/2015   KNEE SURGERY     OOPHORECTOMY     OOPHORECTOMY     1995, 1998   ORIF ANKLE FRACTURE Right 11/28/2021   Procedure: OPEN REDUCTION INTERNAL FIXATION (ORIF) ANKLE FRACTURE;  Surgeon: Meredith Pel, MD;  Location: [place];  Service: Orthopedics;  Laterality: Right;   PELVIC LAPAROSCOPY     x 4 for endometriosis    VAGINAL HYSTERECTOMY  1994    Family History  Problem Relation Age of Onset   Colon cancer Mother    Breast cancer Mother 61       and liver   Migraines Mother    Hyperlipidemia Father    Alcohol abuse Father    Heart attack Father    Cancer Father        Head and neck    Kidney cancer Sister 61   Migraines Sister    Migraines Sister    Hyperlipidemia Brother    Lung cancer Maternal Grandmother    Parkinson's disease Maternal Grandfather    Dementia Maternal Grandfather    Prostate cancer Maternal Grandfather    Parkinson's disease Paternal Grandmother    Heart attack Paternal Grandfather    Esophageal cancer Neg Hx    Stroke Neg Hx     Allergies  Allergen Reactions   Sulfa Antibiotics Anaphylaxis   Sumatriptan Succinate Anaphylaxis   Emgality [Galcanezumab-Gnlm] Nausea Only    GI upset - diarrhea, nausea    Current Outpatient Medications on File Prior to Visit  Medication Sig Dispense Refill   acetaminophen (TYLENOL) 500 MG tablet Take 500 mg by mouth every 6 (six) hours as needed for mild pain, fever or headache.     Atogepant 60 MG TABS Take 1 tablet (60 mg) by mouth daily. 30 tablet 6   atorvastatin (LIPITOR) 40 MG tablet Take 1 tablet (40 mg total) by mouth daily. 90 tablet 1   BOTOX 200 units SOLR  INJECT 200 UNITS AS DIRECTED EVERY 3 (THREE) MONTHS. 1 each 3   Botulinum Toxin Type A (BOTOX) 200 units SOLR Inject 200 Units as directed every 3 (three) months. 1 each 1   Calcium Carbonate-Vitamin D 600-400 MG-UNIT tablet Take 1 tablet by mouth 2 (two) times daily. 60 tablet 11   Coenzyme Q10 (CO Q 10 PO) Take 1 tablet by mouth daily.     denosumab (PROLIA) 60 MG/ML SOSY injection  Inject 60 mg into the skin every 6 (six) months. 1 mL 1   escitalopram (LEXAPRO) 10 MG tablet TAKE 1 TABLET BY MOUTH ONCE DAILY 90 tablet 1   famotidine (PEPCID) 20 MG tablet Take 1 tablet (20 mg total) by mouth daily. 30 tablet 0   methocarbamol (ROBAXIN) 500 MG tablet Take 1 tablet (500 mg total) by mouth every 8 (eight) hours as needed. (Patient taking differently: Take 500 mg by mouth every 8 (eight) hours as needed for muscle spasms.) 30 tablet 0   nadolol (CORGARD) 40 MG tablet Take 1 tablet (40 mg total) by mouth daily. 90 tablet 3   ondansetron (ZOFRAN) 4 MG tablet Take 1 tablet (4 mg total) by mouth every 8 (eight) hours as needed for nausea or vomiting. 20 tablet 0   oxybutynin (DITROPAN XL) 5 MG 24 hr tablet Take 1 tablet (5 mg total) by mouth at bedtime. 90 tablet 1   oxyCODONE-acetaminophen (PERCOCET) 5-325 MG tablet Take 1 tablet by mouth every 4 (four) hours as needed for severe pain. 30 tablet 0   pantoprazole (PROTONIX) 40 MG tablet TAKE 1 TABLET BY MOUTH ONCE DAILY 30 tablet 6   polycarbophil (FIBERCON) 625 MG tablet Take 625 mg by mouth daily.     Riboflavin 400 MG CAPS Take 400 mg by mouth daily.     Topiramate ER (TROKENDI XR) 200 MG CP24 Take 1 capsule (200 mg) by mouth daily. 90 capsule 3   traZODone (DESYREL) 50 MG tablet TAKE 1/2 - 1 TABLET BY MOUTH EVERY NIGHT AT BEDTIME AS NEEDED FOR SLEEP 30 tablet 3   Vitamin D, Ergocalciferol, (DRISDOL) 1.25 MG (50000 UNIT) CAPS capsule Take 1 capsule (50,000 Units total) by mouth every 7 (seven) days. Take for 8 total doses(weeks) 8 capsule 0   No  current facility-administered medications on file prior to visit.    BP 109/64   Pulse 67   Temp 97.9 F (36.6 C)   Resp 18   Ht '5\' 7"'$  (1.702 m)   Wt 120 lb 6.4 oz (54.6 kg)   SpO2 98%   BMI 18.86 kg/m        Objective:   Physical Exam   General Mental Status- Alert. General Appearance- Not in acute distress.   Skin General: Color- Normal Color. Moisture- Normal Moisture.  Neck Carotid Arteries- Normal color. Moisture- Normal Moisture. No carotid bruits. No JVD.  Chest and Lung Exam Auscultation: Breath Sounds:-Normal.  Cardiovascular Auscultation:Rythm- Regular. Murmurs & Other Heart Sounds:Auscultation of the heart reveals- No Murmurs.  Abdomen Inspection:-Inspeection Normal. Palpation/Percussion:Note:No mass. Palpation and Percussion of the abdomen reveal- Non Tender, Non Distended + BS, no rebound or guarding.  Neurologic Cranial Nerve exam:- CN III-XII intact(No nystagmus), symmetric smile. Strength:- 5/5 equal and symmetric strength both upper and lower extremities.      Assessment & Plan:   Patient Instructions  For rt shoulder pain continue to follow up with Dr. Marlou Sa.  For joint pains continue to combine Advil and low dose tylenol pending rheumatology referral.  Anxiety and mood stable.  Continue Lexapro.  For history of stroke continue on aspirin 81 mg daily.  Follow-up with neurologist as scheduled.  For urge incontinence continue with detrol. Glad to hear this is helping.  For rt foot neuropathy and low b12 hx will get b12 and b1 level. No obvius edema on foot exam. If foot pain on walking or more perceived edema can get xray.  As part of your work up for weight loss  did go ahead and place referral to gi MD for repeat colonscopy.  For hx of urge incontinence and frequent urination we got ua today.  Follow up in 3 months or sooner if needed.     Mackie Pai, PA-C   Time spent with patient today was 42  minutes which consisted of  chart revdiew, discussing diagnosis, work up treatment and documentation.

## 2022-05-15 ENCOUNTER — Other Ambulatory Visit (HOSPITAL_BASED_OUTPATIENT_CLINIC_OR_DEPARTMENT_OTHER): Payer: Self-pay

## 2022-05-17 LAB — VITAMIN B1: Vitamin B1 (Thiamine): 21 nmol/L (ref 8–30)

## 2022-05-18 ENCOUNTER — Encounter: Payer: No Typology Code available for payment source | Admitting: Physician Assistant

## 2022-05-23 ENCOUNTER — Other Ambulatory Visit (HOSPITAL_BASED_OUTPATIENT_CLINIC_OR_DEPARTMENT_OTHER): Payer: Self-pay

## 2022-06-07 ENCOUNTER — Other Ambulatory Visit (HOSPITAL_BASED_OUTPATIENT_CLINIC_OR_DEPARTMENT_OTHER): Payer: Self-pay

## 2022-06-20 ENCOUNTER — Encounter: Payer: Self-pay | Admitting: Cardiology

## 2022-06-20 NOTE — Telephone Encounter (Signed)
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

## 2022-06-25 NOTE — Progress Notes (Signed)
Office Visit Note  Patient: Alice Williams             Date of Birth: 03/01/1961           MRN: [id number]             PCP: Elise Benne Referring: Elise Benne Visit Date: 06/26/2022   Subjective:  New Patient (Initial Visit) (After accident in January right hand swelling, body aches and joint pain. ANA of 80, PCP concerned about lupus. )   History of Present Illness: Alice Williams is a 61 y.o. female here for evaluation of positive ANA in association with multiple new joint pains.  She was noted to experience right shoulder pain bilateral hand pain bilateral hip pain and right knee pain.  Earlier this year did suffer a fall with right ankle fracture and complications with carotid artery dissection and CVA.  Is also associated with right wrist fracture this was splinted but nondisplaced and nonoperative management.  Subsequently she has had difficulty with swelling and pain in that area and cannot freely extend the fourth and fifth fingers.  Feels the area is swollen or tight feeling at all times even when there is no visible swelling or discoloration.  For multiple areas of joint pain and some decreased range of motion she is working with physical therapy.  She had a series of 2 right glenohumeral joint injections with Dr. Marlou Sa for most likely frozen shoulder.  Labs reviewed 02/2022 ANA 1:80 cytoplasmic RF neg ESR 7 CRP <1 Uric acid 2.6   Activities of Daily Living:  Patient reports morning stiffness for 5-10 minutes.   Patient Reports nocturnal pain.  Difficulty dressing/grooming: Reports Difficulty climbing stairs: Reports Difficulty getting out of chair: Reports Difficulty using hands for taps, buttons, cutlery, and/or writing: Reports  Review of Systems  Constitutional:  Negative for fatigue.  HENT:  Negative for mouth sores and mouth dryness.   Eyes:  Negative for dryness.  Respiratory:  Positive for shortness of breath.   Cardiovascular:  Positive for  palpitations. Negative for chest pain.  Gastrointestinal:  Positive for constipation and diarrhea. Negative for blood in stool.  Endocrine: Negative for increased urination.  Genitourinary:  Negative for involuntary urination.  Musculoskeletal:  Positive for joint pain, gait problem, joint pain, joint swelling, myalgias, muscle weakness, morning stiffness, muscle tenderness and myalgias.  Skin:  Positive for hair loss. Negative for color change, rash and sensitivity to sunlight.  Allergic/Immunologic: Negative for susceptible to infections.  Neurological:  Positive for headaches. Negative for dizziness.  Hematological:  Negative for swollen glands.  Psychiatric/Behavioral:  Negative for depressed mood and sleep disturbance. The patient is nervous/anxious.     PMFS History:  Patient Active Problem List   Diagnosis Date Noted   Contracture of finger joint, right 06/26/2022   Bilateral hand pain 06/26/2022   Positive ANA (antinuclear antibody) 06/26/2022   Migraine with aura and without status migrainosus, not intractable 02/23/2022   Acute right MCA stroke ([place]) 12/07/2021   GERD without esophagitis 12/07/2021   Right wrist fracture, with routine healing, subsequent encounter 12/07/2021   Bimalleolar ankle fracture, right, closed, with routine healing, subsequent encounter    Blepharospasm 11/08/2021   Sleep disorder, circadian, shift work type 08/01/2021   Excessive daytime sleepiness 08/01/2021   Morning headache 02/16/2021   Left eye pain 02/16/2021   Intractable episodic cluster headache 02/16/2021   Behavioral insomnia of childhood 02/16/2021   Retrognathia 02/16/2021   Chronic migraine without aura, with intractable  migraine, so stated, with status migrainosus 01/12/2021   Muscle spasm 10/14/2020   Vitamin D deficiency 08/15/2020   Acute bilateral low back pain with bilateral sciatica 07/13/2020   Gastric polyp 02/11/2019   Sessile colonic polyp 02/11/2019   Chronic fatigue  02/11/2019   Grade I internal hemorrhoids 02/11/2019   Ductal hyperplasia of breast 08/21/2018   Cognitive changes 06/24/2018   Dyslipidemia 06/24/2018   Dense breast tissue on mammogram 04/17/2018   Fibrocystic breast determined by biopsy 04/09/2018   Family history of breast cancer in mother 04/09/2018   Paroxysmal sinus tachycardia ([place]) 03/27/2018   Osteoporosis 08/28/2017   Facet arthropathy, lumbosacral 08/28/2017   Chronic left SI joint pain 08/27/2017   Left lumbar radiculopathy 08/27/2017   Allergic rhinitis 08/09/2017   Disorder of lipoid metabolism 08/09/2017   Insomnia secondary to anxiety 08/09/2017   Postmenopausal osteoporosis 08/09/2017   History of hysterectomy for benign disease 08/09/2017   GAD (generalized anxiety disorder) 08/09/2017   Former smoker, stopped smoking in distant past 08/09/2017   Minor depressive disorder 08/09/2017   Palpitations 03/16/2015   Family history of colon cancer 03/02/2015   Hiatal hernia with GERD without esophagitis 03/02/2015   History of adenomatous polyp of colon 03/02/2015   Chronic migraine without aura 10/10/2014   History of cardiovascular disorder 01/18/2003    Past Medical History:  Diagnosis Date   Anal fissure    Anemia    in early 20's   Anxiety    Arthritis    back   Breast calcifications on mammogram    Cancer ([place])    pre cancer colon,cervix   Closed right ankle fracture    Colon polyp    CVA (cerebral vascular accident) ([place])    Dyslipidemia 06/24/2018   Elevated cholesterol    Facet arthropathy, lumbosacral 08/28/2017   Family history of adverse reaction to anesthesia    sibling has N/V   GERD (gastroesophageal reflux disease)    Heart murmur    History of colon polyps    History of kidney stones    has small ones   IBS (irritable bowel syndrome)    Inappropriate sinus tachycardia    Migraines    Osteopenia    Osteoporosis of femur without pathological fracture 08/28/2017   Palpitations     PONV (postoperative nausea and vomiting)    Premature surgical menopause    PTSD (post-traumatic stress disorder)    Right wrist fracture    Sleep apnea    Stroke ([place])     Family History  Problem Relation Age of Onset   Colon cancer Mother    Breast cancer Mother 60       and liver   Migraines Mother    Hyperlipidemia Father    Alcohol abuse Father    Heart attack Father    Cancer Father        Head and neck    Breast cancer Sister    Migraines Sister    Migraines Sister    Kidney cancer Sister    Renal cancer Sister    Hyperlipidemia Brother    Atrial fibrillation Brother    Lung cancer Maternal Grandmother    Parkinson's disease Maternal Grandfather    Dementia Maternal Grandfather    Prostate cancer Maternal Grandfather    Parkinson's disease Paternal Grandmother    Heart attack Paternal Grandfather    Esophageal cancer Neg Hx    Stroke Neg Hx    Past Surgical History:  Procedure Laterality Date  ANAL FISSURE REPAIR     APPENDECTOMY  2000   COLONOSCOPY W/ POLYPECTOMY  03/2015   COLONOSCOPY WITH ESOPHAGOGASTRODUODENOSCOPY (EGD)  04/01/2015   KNEE SURGERY     OOPHORECTOMY     OOPHORECTOMY     1995, 1998   ORIF ANKLE FRACTURE Right 11/28/2021   Procedure: OPEN REDUCTION INTERNAL FIXATION (ORIF) ANKLE FRACTURE;  Surgeon: Meredith Pel, MD;  Location: [place];  Service: Orthopedics;  Laterality: Right;   PELVIC LAPAROSCOPY     x 4 for endometriosis    TONSILLECTOMY     VAGINAL HYSTERECTOMY  1994   WISDOM TOOTH EXTRACTION     Social History   Social History Narrative   Single, livevs alone in a 2 story house. Drinks two sodas a day. Does not exercise regularly.   Immunization History  Administered Date(s) Administered   DT (Pediatric) 12/07/1993, 01/20/2004   DTaP 12/07/1993, 01/20/2004   Hepatitis A 12/31/2016   Influenza-Unspecified 07/29/2018, 07/26/2020   Meningococcal Conjugate 12/31/2016   PFIZER(Purple Top)SARS-COV-2 Vaccination 10/08/2019,  11/03/2019, 07/26/2020   Tdap 05/24/2010, 05/24/2010, 11/08/2020     Objective: Vital Signs: BP 117/74 (BP Location: Left Arm, Patient Position: Sitting, Cuff Size: Normal)   Pulse 68   Resp 15   Ht 5' 7"  (1.702 m)   Wt 116 lb 6.4 oz (52.8 kg)   BMI 18.23 kg/m    Physical Exam HENT:     Mouth/Throat:     Mouth: Mucous membranes are moist.     Pharynx: Oropharynx is clear.  Cardiovascular:     Rate and Rhythm: Normal rate and regular rhythm.  Pulmonary:     Effort: Pulmonary effort is normal.     Breath sounds: Normal breath sounds.  Musculoskeletal:     Right lower leg: No edema.     Left lower leg: No edema.  Lymphadenopathy:     Cervical: No cervical adenopathy.  Skin:    General: Skin is warm and dry.     Comments: Scattered petechiae on upper arms  Neurological:     Mental Status: She is alert.  Psychiatric:        Mood and Affect: Mood normal.      Musculoskeletal Exam:  Shoulders full ROM no tenderness or swelling Elbows full ROM no tenderness or swelling Wrists full ROM no tenderness or swelling Fingers with nodules on PIP and DIPs of right hand, 4th and 5th fingers with slight flexion contracture palpable nodule on palm without tenderness Knees full ROM, left knee patellofemoral crepitus, no effusoins Ankles full ROM no tenderness or swelling MTPs full ROM no tenderness or swelling   Investigation: No additional findings.  Imaging: XR Hand 2 View Left  Result Date: 07/02/2022 X-ray left hand 2 views Radiocarpal carpal joint spaces appear normal.  MCP PIP DIP joints appear normal.  No erosions seen.  No calcifications and bone mineralization appears normal. Impression No evidence of injury or chronic inflammatory changes  XR Hand 2 View Right  Result Date: 07/02/2022 X-ray right hand 2 views Radiocarpal and carpal joint spaces appear normal.  MCP and PIP joints appear normal.  Very mild asymmetric joint space narrowing in several DIP joints.  No erosions  seen.  No abnormal calcifications and bone mineralization appears normal. Impression No evidence of acute injury or chronic inflammatory changes   Recent Labs: Lab Results  Component Value Date   WBC 7.2 06/27/2022   HGB 13.2 06/27/2022   PLT 232.0 06/27/2022   NA 141 06/27/2022  K 4.0 06/27/2022   CL 111 06/27/2022   CO2 24 06/27/2022   GLUCOSE 97 06/27/2022   BUN 14 06/27/2022   CREATININE 0.63 06/27/2022   BILITOT 0.4 06/27/2022   ALKPHOS 64 06/27/2022   AST 14 06/27/2022   ALT 11 06/27/2022   PROT 6.5 06/27/2022   ALBUMIN 4.1 06/27/2022   CALCIUM 8.9 06/27/2022   GFRAA >60 06/01/2020    Speciality Comments: No specialty comments available.  Procedures:  No procedures performed Allergies: Sulfa antibiotics, Sumatriptan succinate, and Emgality [galcanezumab-gnlm]   Assessment / Plan:     Visit Diagnoses: Positive ANA (antinuclear antibody) - Plan: RNP Antibody, Anti-Smith antibody, Sjogrens syndrome-A extractable nuclear antibody, Sjogrens syndrome-B extractable nuclear antibody, Anti-DNA antibody, double-stranded, C3 and C4, Anti-scleroderma antibody  Joint pain in several areas none obviously inflamed on examination today and no other specific clinical criteria.  We will check more specific antibody markers as detailed.  However I have a lower overall pretest suspicion that this is indicative of an active connective tissue disease.  Contracture of finger joint, right  Right fourth and fifth fingers and partially contracted position and developing flexor tendon or palmar nodule or contracture.  Apparently had triquetrum fracture few months earlier with fall also CVA.  Bilateral hand pain - Plan: XR Hand 2 View Right, XR Hand 2 View Left  X-rays of bilateral hands checked do not show any changes suggestive for chronic inflammatory arthritis.  Previous right wrist fracture there is no significant displaced carpal bone changes that I can appreciate on  imaging.  Orders: Orders Placed This Encounter  Procedures   XR Hand 2 View Right   XR Hand 2 View Left   RNP Antibody   Anti-Smith antibody   Sjogrens syndrome-A extractable nuclear antibody   Sjogrens syndrome-B extractable nuclear antibody   Anti-DNA antibody, double-stranded   C3 and C4   Anti-scleroderma antibody   No orders of the defined types were placed in this encounter.    Follow-Up Instructions: Return in about 3 weeks (around 07/17/2022) for New pt +ANA f/u 3wks.   Collier Salina, MD  Note - This record has been created using [employer].  Chart creation errors have been sought, but may not always  have been located. Such creation errors do not reflect on  the standard of medical care.

## 2022-06-26 ENCOUNTER — Ambulatory Visit: Payer: No Typology Code available for payment source | Attending: Internal Medicine | Admitting: Internal Medicine

## 2022-06-26 ENCOUNTER — Ambulatory Visit (INDEPENDENT_AMBULATORY_CARE_PROVIDER_SITE_OTHER): Payer: No Typology Code available for payment source

## 2022-06-26 ENCOUNTER — Encounter: Payer: Self-pay | Admitting: Internal Medicine

## 2022-06-26 VITALS — BP 117/74 | HR 68 | Resp 15 | Ht 67.0 in | Wt 116.4 lb

## 2022-06-26 DIAGNOSIS — M79642 Pain in left hand: Secondary | ICD-10-CM

## 2022-06-26 DIAGNOSIS — R768 Other specified abnormal immunological findings in serum: Secondary | ICD-10-CM | POA: Diagnosis not present

## 2022-06-26 DIAGNOSIS — M79641 Pain in right hand: Secondary | ICD-10-CM | POA: Diagnosis not present

## 2022-06-26 DIAGNOSIS — M24541 Contracture, right hand: Secondary | ICD-10-CM

## 2022-06-27 ENCOUNTER — Telehealth: Payer: Self-pay

## 2022-06-27 ENCOUNTER — Ambulatory Visit (INDEPENDENT_AMBULATORY_CARE_PROVIDER_SITE_OTHER): Payer: No Typology Code available for payment source | Admitting: Gastroenterology

## 2022-06-27 ENCOUNTER — Encounter: Payer: Self-pay | Admitting: Gastroenterology

## 2022-06-27 ENCOUNTER — Other Ambulatory Visit (HOSPITAL_BASED_OUTPATIENT_CLINIC_OR_DEPARTMENT_OTHER): Payer: Self-pay

## 2022-06-27 ENCOUNTER — Other Ambulatory Visit (INDEPENDENT_AMBULATORY_CARE_PROVIDER_SITE_OTHER): Payer: No Typology Code available for payment source

## 2022-06-27 VITALS — BP 104/68 | HR 67 | Ht 67.0 in | Wt 116.2 lb

## 2022-06-27 DIAGNOSIS — R112 Nausea with vomiting, unspecified: Secondary | ICD-10-CM

## 2022-06-27 DIAGNOSIS — K449 Diaphragmatic hernia without obstruction or gangrene: Secondary | ICD-10-CM | POA: Diagnosis not present

## 2022-06-27 DIAGNOSIS — R1032 Left lower quadrant pain: Secondary | ICD-10-CM

## 2022-06-27 DIAGNOSIS — K582 Mixed irritable bowel syndrome: Secondary | ICD-10-CM

## 2022-06-27 DIAGNOSIS — K219 Gastro-esophageal reflux disease without esophagitis: Secondary | ICD-10-CM

## 2022-06-27 LAB — CBC WITH DIFFERENTIAL/PLATELET
Basophils Absolute: 0.1 10*3/uL (ref 0.0–0.1)
Basophils Relative: 1 % (ref 0.0–3.0)
Eosinophils Absolute: 0.2 10*3/uL (ref 0.0–0.7)
Eosinophils Relative: 3.1 % (ref 0.0–5.0)
HCT: 37.8 % (ref 36.0–46.0)
Hemoglobin: 13.2 g/dL (ref 12.0–15.0)
Lymphocytes Relative: 20.8 % (ref 12.0–46.0)
Lymphs Abs: 1.5 10*3/uL (ref 0.7–4.0)
MCHC: 34.8 g/dL (ref 30.0–36.0)
MCV: 89.1 fl (ref 78.0–100.0)
Monocytes Absolute: 0.5 10*3/uL (ref 0.1–1.0)
Monocytes Relative: 6.3 % (ref 3.0–12.0)
Neutro Abs: 5 10*3/uL (ref 1.4–7.7)
Neutrophils Relative %: 68.8 % (ref 43.0–77.0)
Platelets: 232 10*3/uL (ref 150.0–400.0)
RBC: 4.25 Mil/uL (ref 3.87–5.11)
RDW: 12.8 % (ref 11.5–15.5)
WBC: 7.2 10*3/uL (ref 4.0–10.5)

## 2022-06-27 LAB — ANTI-SCLERODERMA ANTIBODY: Scleroderma (Scl-70) (ENA) Antibody, IgG: <1 AI

## 2022-06-27 LAB — ANTI-SMITH ANTIBODY: ENA SM Ab Ser-aCnc: <1 AI

## 2022-06-27 LAB — COMPREHENSIVE METABOLIC PANEL
ALT: 11 U/L (ref 0–35)
AST: 14 U/L (ref 0–37)
Albumin: 4.1 g/dL (ref 3.5–5.2)
Alkaline Phosphatase: 64 U/L (ref 39–117)
BUN: 14 mg/dL (ref 6–23)
CO2: 24 mEq/L (ref 19–32)
Calcium: 8.9 mg/dL (ref 8.4–10.5)
Chloride: 111 mEq/L (ref 96–112)
Creatinine, Ser: 0.63 mg/dL (ref 0.40–1.20)
GFR: 96.3 mL/min (ref >60.00)
Glucose, Bld: 97 mg/dL (ref 70–99)
Potassium: 4 mEq/L (ref 3.5–5.1)
Sodium: 141 mEq/L (ref 135–145)
Total Bilirubin: 0.4 mg/dL (ref 0.2–1.2)
Total Protein: 6.5 g/dL (ref 6.0–8.3)

## 2022-06-27 LAB — LIPASE: Lipase: 22 U/L (ref 11.0–59.0)

## 2022-06-27 LAB — SJOGRENS SYNDROME-B EXTRACTABLE NUCLEAR ANTIBODY: SSB (La) (ENA) Antibody, IgG: <1 AI

## 2022-06-27 LAB — RNP ANTIBODY: Ribonucleic Protein(ENA) Antibody, IgG: <1 AI

## 2022-06-27 LAB — SJOGRENS SYNDROME-A EXTRACTABLE NUCLEAR ANTIBODY: SSA (Ro) (ENA) Antibody, IgG: <1 AI

## 2022-06-27 LAB — C3 AND C4
C3 Complement: 120 mg/dL (ref 83–193)
C4 Complement: 31 mg/dL (ref 15–57)

## 2022-06-27 LAB — ANTI-DNA ANTIBODY, DOUBLE-STRANDED: ds DNA Ab: <1 IU/mL

## 2022-06-27 MED ORDER — ONDANSETRON 4 MG PO TBDP
4.0000 mg | ORAL_TABLET | Freq: Four times a day (QID) | ORAL | 2 refills | Status: AC | PRN
  Filled 2022-06-27: qty 30, 8d supply, fill #0

## 2022-06-27 NOTE — Progress Notes (Signed)
Chief Complaint: FU Abdominal pain  Referring Provider:  Mackie Pai, PA-C      ASSESSMENT AND PLAN;   #1.  Unexplained wt loss, intermittent N/V.  #2. GERD with small HH (on CT and EGD) with intermittent N/V. Neg [place] 08/2018. Neg HIDA with EF remotely per pt.  #3. FH colon cancer (mom) at age 60 (Stage IV).  H/O adenomatous colonic polyps in past. Neg EGD/colon 01/2019. Rpt colon 01/2024  #4. IBS with alt diarrhea/constipation/LLQ pain. Neg stool studies, CT abdo/pelvis 10/2018 and labs including TSH, sed rate and celiac screen.  #5. CT A/P 10/2018-neg except for small 9 mm omental nodule.  Radiology recommend rpt CT in 3 months.  Plan:  -CT AP with contrast -CBC, CMP, lipase -EGD/colon after neuro clearence (Dr Jaynee Eagles) -Continue protonix '40mg'$  po QD -Zofran '4mg'$  ODT Q6hrs PRN for N/V, #30, 3 refills  -If neg eval, and still with problems, proceed with solid GES to rule out gastroparesis. -Consider trial of Remeron if continued weight loss.   HPI:    Alice Williams is a 61 y.o. female  RN at [employer] With anxiety, recent MCA CVA 11/2023, neuropathy, multiple comorbidities as below  Referred back for weight loss- 20lb over last 3 to 4 months with a decreased appetite, early satiety.  She does have nausea and occasional vomiting.  Also postprandial epi pain.  No odynophagia or dysphagia.  She denies having any heartburn as long as she takes Protonix.  No significant diarrhea or constipation.  No nonsteroidals except for baby aspirin.  Has been advised to get repeat EGD/colon        From previous notes : celiac screen was negative except for TTG IgG which was borderline positive.  CT scan Abdo/pelvis was unremarkable. Previously diagnosed as having IBS-had negative EGD and colonoscopy at [facility] 2016 Denies having any intolerance to gluten/milk or cheese.  She asked to drink milk.  Previously when she was constipated she would take corn and milk with good bowel  movements. No melena or hematochezia.  Has a stressful job.  Not been sleeping very well.  Also with previous history of endometriosis.  Wt Readings from Last 3 Encounters:  06/27/22 116 lb 4 oz (52.7 kg)  06/26/22 116 lb 6.4 oz (52.8 kg)  05/14/22 120 lb 6.4 oz (54.6 kg)      PSH - Works as Marine scientist 6[employer]  Past GI procedures: -Colonoscopy 01/2019 with negative random colonic biopsies and TI biopsies.  Hyperplastic polyps.  Repeat in 5 years due to family history. -EGD 01/2019 with negative gastric and small bowel biopsies.  Showed mild gastritis and small HH. Past Medical History:  Diagnosis Date   Anal fissure    Anemia    in early 20's   Anxiety    Arthritis    back   Breast calcifications on mammogram    Cancer [facility])    pre cancer colon,cervix   Closed right ankle fracture    Colon polyp    CVA (cerebral vascular accident) ([place])    Dyslipidemia 06/24/2018   Elevated cholesterol    Facet arthropathy, lumbosacral 08/28/2017   Family history of adverse reaction to anesthesia    sibling has N/V   GERD (gastroesophageal reflux disease)    Heart murmur    History of colon polyps    History of kidney stones    has small ones   IBS (irritable bowel syndrome)    Inappropriate sinus tachycardia    Migraines  Osteopenia    Osteoporosis of femur without pathological fracture 08/28/2017   Palpitations    PONV (postoperative nausea and vomiting)    Premature surgical menopause    PTSD (post-traumatic stress disorder)    Right wrist fracture    Sleep apnea    Stroke [facility])     Past Surgical History:  Procedure Laterality Date   ANAL FISSURE REPAIR     APPENDECTOMY  2000   COLONOSCOPY W/ POLYPECTOMY  03/2015   COLONOSCOPY WITH ESOPHAGOGASTRODUODENOSCOPY (EGD)  04/01/2015   KNEE SURGERY     OOPHORECTOMY     OOPHORECTOMY     1995, 1998   ORIF ANKLE FRACTURE Right 11/28/2021   Procedure: OPEN REDUCTION INTERNAL FIXATION (ORIF) ANKLE FRACTURE;  Surgeon: Meredith Pel, MD;  Location: [place];  Service: Orthopedics;  Laterality: Right;   PELVIC LAPAROSCOPY     x 4 for endometriosis    TONSILLECTOMY     VAGINAL HYSTERECTOMY  1994   WISDOM TOOTH EXTRACTION      Family History  Problem Relation Age of Onset   Colon cancer Mother    Breast cancer Mother 22       and liver   Migraines Mother    Hyperlipidemia Father    Alcohol abuse Father    Heart attack Father    Cancer Father        Head and neck    Breast cancer Sister    Migraines Sister    Migraines Sister    Kidney cancer Sister    Renal cancer Sister    Hyperlipidemia Brother    Atrial fibrillation Brother    Lung cancer Maternal Grandmother    Parkinson's disease Maternal Grandfather    Dementia Maternal Grandfather    Prostate cancer Maternal Grandfather    Parkinson's disease Paternal Grandmother    Heart attack Paternal Grandfather    Esophageal cancer Neg Hx    Stroke Neg Hx     Social History   Tobacco Use   Smoking status: Former    Packs/day: 1.00    Years: 35.00    Total pack years: 35.00    Types: Cigarettes    Quit date: 08/09/2006    Years since quitting: 15.8    Passive exposure: Never   Smokeless tobacco: Never  Vaping Use   Vaping Use: Never used  Substance Use Topics   Alcohol use: Not Currently    Comment: rare   Drug use: No    Current Outpatient Medications  Medication Sig Dispense Refill   acetaminophen (TYLENOL) 500 MG tablet Take 500 mg by mouth every 6 (six) hours as needed for mild pain, fever or headache.     Atogepant 60 MG TABS Take 1 tablet (60 mg) by mouth daily. 30 tablet 6   atorvastatin (LIPITOR) 40 MG tablet Take 1 tablet (40 mg total) by mouth daily. 90 tablet 1   Calcium Carbonate-Vitamin D 600-400 MG-UNIT tablet Take 1 tablet by mouth 2 (two) times daily. 60 tablet 11   Coenzyme Q10 (CO Q 10 PO) Take 1 tablet by mouth daily.     denosumab (PROLIA) 60 MG/ML SOSY injection Inject 60 mg into the skin every 6 (six) months. 1 mL 1    escitalopram (LEXAPRO) 10 MG tablet TAKE 1 TABLET BY MOUTH ONCE DAILY 90 tablet 1   famotidine (PEPCID) 20 MG tablet Take 1 tablet (20 mg total) by mouth daily. 30 tablet 0   methocarbamol (ROBAXIN) 500 MG tablet Take 1  tablet (500 mg total) by mouth every 8 (eight) hours as needed. (Patient taking differently: Take 500 mg by mouth every 8 (eight) hours as needed for muscle spasms.) 30 tablet 0   nadolol (CORGARD) 40 MG tablet Take 1 tablet (40 mg total) by mouth daily. 90 tablet 3   ondansetron (ZOFRAN) 4 MG tablet Take 1 tablet (4 mg total) by mouth every 8 (eight) hours as needed for nausea or vomiting. 20 tablet 0   oxybutynin (DITROPAN XL) 5 MG 24 hr tablet Take 1 tablet (5 mg total) by mouth at bedtime. 90 tablet 1   oxyCODONE-acetaminophen (PERCOCET) 5-325 MG tablet Take 1 tablet by mouth every 4 (four) hours as needed for severe pain. 30 tablet 0   pantoprazole (PROTONIX) 40 MG tablet TAKE 1 TABLET BY MOUTH ONCE DAILY 30 tablet 6   polycarbophil (FIBERCON) 625 MG tablet Take 625 mg by mouth daily.     Riboflavin 400 MG CAPS Take 400 mg by mouth daily.     Topiramate ER (TROKENDI XR) 200 MG CP24 Take 1 capsule (200 mg) by mouth daily. 90 capsule 3   traZODone (DESYREL) 50 MG tablet TAKE 1/2 - 1 TABLET BY MOUTH EVERY NIGHT AT BEDTIME AS NEEDED FOR SLEEP 30 tablet 3   Vitamin D, Ergocalciferol, (DRISDOL) 1.25 MG (50000 UNIT) CAPS capsule Take 1 capsule (50,000 Units total) by mouth every 7 (seven) days. Take for 8 total doses(weeks) 8 capsule 0   BOTOX 200 units SOLR INJECT 200 UNITS AS DIRECTED EVERY 3 (THREE) MONTHS. (Patient not taking: Reported on 06/26/2022) 1 each 3   Botulinum Toxin Type A (BOTOX) 200 units SOLR Inject 200 Units as directed every 3 (three) months. (Patient not taking: Reported on 06/26/2022) 1 each 1   No current facility-administered medications for this visit.    Allergies  Allergen Reactions   Sulfa Antibiotics Anaphylaxis   Sumatriptan Succinate Anaphylaxis    Emgality [Galcanezumab-Gnlm] Nausea Only    GI upset - diarrhea, nausea    Review of Systems:  Neg Has anxiety and sleep problems Has stressful job.     Physical Exam:    Ht '5\' 7"'$  (1.702 m)   Wt 116 lb 4 oz (52.7 kg)   BMI 18.21 kg/m  Filed Weights   06/27/22 1053  Weight: 116 lb 4 oz (52.7 kg)   Constitutional: Thin built, in no acute distress. Psychiatric: Normal mood and affect. Behavior is normal. HEENT: Pupils normal.  Conjunctivae are normal. No scleral icterus.  Wearing mask. Cardiovascular: Normal rate, regular rhythm. No edema Pulmonary/chest: Effort normal and breath sounds normal. No wheezing, rales or rhonchi. Abdominal: Soft, nondistended.  Mild left lower quadrant abdominal tenderness, no rebound. Bowel sounds active throughout. There are no masses palpable. No hepatomegaly. Rectal:  defered Neurological: Alert and oriented to person place and time. Skin: Skin is warm and dry. No rashes noted.  Data Reviewed: I have personally reviewed following labs and imaging studies  CBC:    Latest Ref Rng & Units 05/14/2022   12:01 PM 12/21/2021   11:32 AM 12/14/2021   12:13 PM  CBC  WBC 4.0 - 10.5 K/uL 7.5  11.1  10.5   Hemoglobin 12.0 - 15.0 g/dL 12.0  12.6  14.1   Hematocrit 36.0 - 46.0 % 34.8  37.7  39.4   Platelets 150.0 - 400.0 K/uL 202.0  253.0  382     CMP:    Latest Ref Rng & Units 05/14/2022   12:01 PM 01/11/2022  10:35 AM 12/21/2021   11:32 AM  CMP  Glucose 70 - 99 mg/dL 92  103  95   BUN 6 - 23 mg/dL '19  9  11   '$ Creatinine 0.40 - 1.20 mg/dL 0.71  0.73  0.75   Sodium 135 - 145 mEq/L 143  138  139   Potassium 3.5 - 5.1 mEq/L 3.7  3.4  3.5   Chloride 96 - 112 mEq/L 109  107  106   CO2 19 - 32 mEq/L '26  23  27   '$ Calcium 8.4 - 10.5 mg/dL 9.3  8.9  8.9   Total Protein 6.0 - 8.3 g/dL 6.2  5.9  6.0   Total Bilirubin 0.2 - 1.2 mg/dL 0.4  0.5  0.5   Alkaline Phos 39 - 117 U/L 62  72  95   AST 0 - 37 U/L '16  19  19   '$ ALT 0 - 35 U/L '16  14  16    '$ CT  11/19/2018 IMPRESSION: 1. No acute findings in the abdomen or pelvis. Specifically, no findings to explain the patient's history of left-sided abdominal pain. 2. 9 mm omental nodule identified lower left abdomen. This does not appear to be a diverticulum. Follow-up CT in 3 months recommended to ensure stability. 3. Tiny hiatal hernia.     Carmell Austria, MD 06/27/2022, 11:18 AM  Cc: Mackie Pai, PA-C

## 2022-06-27 NOTE — Patient Instructions (Signed)
_______________________________________________________  If you are age 60 or older, your body mass index should be between 23-30. Your Body mass index is 18.21 kg/m. If this is out of the aforementioned range listed, please consider follow up with your Primary Care Provider.  If you are age 62 or younger, your body mass index should be between 19-25. Your Body mass index is 18.21 kg/m. If this is out of the aformentioned range listed, please consider follow up with your Primary Care Provider.   ________________________________________________________  The [facility] providers would like to encourage you to use [facility] to communicate with providers for non-urgent requests or questions.  Due to long hold times on the telephone, sending your provider a message by [facility] may be a faster and more efficient way to get a response.  Please allow 48 business hours for a response.  Please remember that this is for non-urgent requests.  _______________________________________________________  Your provider has requested that you go to the basement level for lab work before leaving today. Press "B" on the elevator. The lab is located at the first door on the left as you exit the elevator.  You have been scheduled for an endoscopy and colonoscopy. Please follow the written instructions given to you at your visit today. Please pick up your prep supplies at the pharmacy within the next 1-3 days. If you use inhalers (even only as needed), please bring them with you on the day of your procedure.  Continue Protonix  Zofran sent in to pharmacy  You will be contacted by our office prior to your procedure regarding neurology clearance.  If you do not hear from our office 2 week prior to your scheduled procedure, please call [phone] to discuss.   You have been scheduled for a CT scan of the abdomen and pelvis at [facility] ([place], [place], [place] [id number]).   You are scheduled on  07-09-2022 at 230pm. You should arrive 15 minutes prior to your appointment time for registration. Please follow the written instructions below on the day of your exam:  WARNING: IF YOU ARE ALLERGIC TO IODINE/X-RAY DYE, PLEASE NOTIFY RADIOLOGY IMMEDIATELY AT [phone]! YOU WILL BE GIVEN A 13 HOUR PREMEDICATION PREP.  1) Do not eat or drink anything after 1030am (4 hours prior to your test) 2) You have been given 2 bottles of oral contrast to drink. The solution may taste better if refrigerated, but do NOT add ice or any other liquid to this solution. Shake well before drinking.    Drink 1 bottle of contrast @ 1230am (2 hours prior to your exam)  Drink 1 bottle of contrast @ 130pm (1 hour prior to your exam)  You may take any medications as prescribed with a small amount of water, if necessary. If you take any of the following medications: METFORMIN, GLUCOPHAGE, GLUCOVANCE, AVANDAMET, RIOMET, FORTAMET, Baring MET, JANUMET, GLUMETZA or METAGLIP, you MAY be asked to HOLD this medication 48 hours AFTER the exam.  The purpose of you drinking the oral contrast is to aid in the visualization of your intestinal tract. The contrast solution may cause some diarrhea. Depending on your individual set of symptoms, you may also receive an intravenous injection of x-ray contrast/dye. Plan on being at [facility] for 30 minutes or longer, depending on the type of exam you are having performed.  This test typically takes 30-45 minutes to complete.  If you have any questions regarding your exam or if you need to reschedule, you may  call the CT department at [phone] between the hours of 8:00 am and 5:00 pm, Monday-Friday.  ________________________________________________________________________  Thank you,  Dr. Jackquline Denmark

## 2022-06-27 NOTE — Telephone Encounter (Signed)
Good day,  This patient is having an EGD/Colon with Dr Lyndel Safe at the [facility] and we are wanting to know from a neurologist standpoint if this patient is cleared to have these endoscopies procedure. Please respond as soon as possible with an answer. Thank you for your time

## 2022-06-28 ENCOUNTER — Ambulatory Visit (INDEPENDENT_AMBULATORY_CARE_PROVIDER_SITE_OTHER): Payer: No Typology Code available for payment source | Admitting: Orthopedic Surgery

## 2022-06-28 DIAGNOSIS — M7501 Adhesive capsulitis of right shoulder: Secondary | ICD-10-CM | POA: Diagnosis not present

## 2022-06-28 NOTE — Telephone Encounter (Signed)
Patient made aware she is cleared from Neuro standpoint.

## 2022-06-29 ENCOUNTER — Encounter: Payer: Self-pay | Admitting: Orthopedic Surgery

## 2022-06-29 NOTE — Progress Notes (Signed)
Office Visit Note   Patient: Alice Williams           Date of Birth: May 21, 1961           MRN: [id number] Visit Date: 06/28/2022 Requested by: Mackie Pai, PA-C [place],  [place] [id number] PCP: Elise Benne  Subjective: Chief Complaint  Patient presents with   Right Shoulder - Follow-up   Right Ankle - Follow-up    HPI: She is in is a 61 year old patient here to review right shoulder and right ankle pain.  Had right shoulder injection 05/04/2022 for adhesive capsulitis.  Feels much better.  Range of motion greatly improved.  Still is painful but about 75% painful.  Right ankle had prior ankle fracture.  Does have some medial sided tenderness around the screw which is palpable but not prominent.  Still has some numbness on the dorsal and plantar aspect of the foot which actually extends above the incision which may be related to her block.  Does have some soreness in the ankle at the end of the day but is able to work.              ROS: All systems reviewed are negative as they relate to the chief complaint within the history of present illness.  Patient denies  fevers or chills.   Assessment & Plan: Visit Diagnoses:  1. Adhesive capsulitis of right shoulder     Plan: Impression is right shoulder and ankle pain.  Right shoulder is improved with injection and no further intervention required on that front.  Continue with stretching exercises.  Taking ibuprofen and Tylenol for symptoms.  Right ankle demonstrates some tenderness around the medial malleolus and thus consideration could be given to hardware removal but we will hold off on that for now.  Follow-up as needed.  Did discuss with her the rehab that would be involved with hardware removal which would be fairly minimal.  Follow-Up Instructions: No follow-ups on file.   Orders:  No orders of the defined types were placed in this encounter.  No orders of the defined types were placed in this  encounter.     Procedures: No procedures performed   Clinical Data: No additional findings.  Objective: Vital Signs: There were no vitals taken for this visit.  Physical Exam:   Constitutional: Patient appears well-developed HEENT:  Head: Normocephalic Eyes:EOM are normal Neck: Normal range of motion Cardiovascular: Normal rate Pulmonary/chest: Effort normal Neurologic: Patient is alert Skin: Skin is warm Psychiatric: Patient has normal mood and affect   Ortho Exam: Ortho exam demonstrates full active and passive range of motion of her cervical spine.  Right shoulder range of motion is 60/95/150.  Rotator cuff strength is good on the right-hand side.  Right ankle demonstrates excellent range of motion with minimal swelling.  Does have some tenderness to palpation around that medial malleolus focally.  Hardware is palpable but not prominent.  Palpable intact nontender anterior to posterior to peroneal and Achilles tendons.  No other masses lymphadenopathy or skin changes noted in that right ankle region.  Does have some paresthesias extending above the incisions up to the mid calf region on the right-hand side dorsal hand ventral.  Specialty Comments:  No specialty comments available.  Imaging: No results found.   PMFS History: Patient Active Problem List   Diagnosis Date Noted   Contracture of finger joint, right 06/26/2022   Bilateral hand pain 06/26/2022   Positive ANA (antinuclear  antibody) 06/26/2022   Migraine with aura and without status migrainosus, not intractable 02/23/2022   Acute right MCA stroke ([place]) 12/07/2021   GERD without esophagitis 12/07/2021   Right wrist fracture, with routine healing, subsequent encounter 12/07/2021   Bimalleolar ankle fracture, right, closed, with routine healing, subsequent encounter    Blepharospasm 11/08/2021   Sleep disorder, circadian, shift work type 08/01/2021   Excessive daytime sleepiness 08/01/2021   Morning headache  02/16/2021   Left eye pain 02/16/2021   Intractable episodic cluster headache 02/16/2021   Behavioral insomnia of childhood 02/16/2021   Retrognathia 02/16/2021   Chronic migraine without aura, with intractable migraine, so stated, with status migrainosus 01/12/2021   Muscle spasm 10/14/2020   Vitamin D deficiency 08/15/2020   Acute bilateral low back pain with bilateral sciatica 07/13/2020   Gastric polyp 02/11/2019   Sessile colonic polyp 02/11/2019   Chronic fatigue 02/11/2019   Grade I internal hemorrhoids 02/11/2019   Ductal hyperplasia of breast 08/21/2018   Cognitive changes 06/24/2018   Dyslipidemia 06/24/2018   Dense breast tissue on mammogram 04/17/2018   Fibrocystic breast determined by biopsy 04/09/2018   Family history of breast cancer in mother 04/09/2018   Paroxysmal sinus tachycardia ([place]) 03/27/2018   Osteoporosis 08/28/2017   Facet arthropathy, lumbosacral 08/28/2017   Chronic left SI joint pain 08/27/2017   Left lumbar radiculopathy 08/27/2017   Allergic rhinitis 08/09/2017   Disorder of lipoid metabolism 08/09/2017   Insomnia secondary to anxiety 08/09/2017   Postmenopausal osteoporosis 08/09/2017   History of hysterectomy for benign disease 08/09/2017   GAD (generalized anxiety disorder) 08/09/2017   Former smoker, stopped smoking in distant past 08/09/2017   Minor depressive disorder 08/09/2017   Palpitations 03/16/2015   Family history of colon cancer 03/02/2015   Hiatal hernia with GERD without esophagitis 03/02/2015   History of adenomatous polyp of colon 03/02/2015   Chronic migraine without aura 10/10/2014   History of cardiovascular disorder 01/18/2003   Past Medical History:  Diagnosis Date   Anal fissure    Anemia    in early 20's   Anxiety    Arthritis    back   Breast calcifications on mammogram    Cancer ([place])    pre cancer colon,cervix   Closed right ankle fracture    Colon polyp    CVA (cerebral vascular accident) ([place])     Dyslipidemia 06/24/2018   Elevated cholesterol    Facet arthropathy, lumbosacral 08/28/2017   Family history of adverse reaction to anesthesia    sibling has N/V   GERD (gastroesophageal reflux disease)    Heart murmur    History of colon polyps    History of kidney stones    has small ones   IBS (irritable bowel syndrome)    Inappropriate sinus tachycardia    Migraines    Osteopenia    Osteoporosis of femur without pathological fracture 08/28/2017   Palpitations    PONV (postoperative nausea and vomiting)    Premature surgical menopause    PTSD (post-traumatic stress disorder)    Right wrist fracture    Sleep apnea    Stroke ([place])     Family History  Problem Relation Age of Onset   Colon cancer Mother    Breast cancer Mother 78       and liver   Migraines Mother    Hyperlipidemia Father    Alcohol abuse Father    Heart attack Father    Cancer Father  Head and neck    Breast cancer Sister    Migraines Sister    Migraines Sister    Kidney cancer Sister    Renal cancer Sister    Hyperlipidemia Brother    Atrial fibrillation Brother    Lung cancer Maternal Grandmother    Parkinson's disease Maternal Grandfather    Dementia Maternal Grandfather    Prostate cancer Maternal Grandfather    Parkinson's disease Paternal Grandmother    Heart attack Paternal Grandfather    Esophageal cancer Neg Hx    Stroke Neg Hx     Past Surgical History:  Procedure Laterality Date   ANAL FISSURE REPAIR     APPENDECTOMY  2000   COLONOSCOPY W/ POLYPECTOMY  03/2015   COLONOSCOPY WITH ESOPHAGOGASTRODUODENOSCOPY (EGD)  04/01/2015   KNEE SURGERY     OOPHORECTOMY     OOPHORECTOMY     1995, 1998   ORIF ANKLE FRACTURE Right 11/28/2021   Procedure: OPEN REDUCTION INTERNAL FIXATION (ORIF) ANKLE FRACTURE;  Surgeon: Meredith Pel, MD;  Location: [facility];  Service: Orthopedics;  Laterality: Right;   PELVIC LAPAROSCOPY     x 4 for endometriosis    TONSILLECTOMY     VAGINAL  HYSTERECTOMY  1994   WISDOM TOOTH EXTRACTION     Social History   Occupational History   Occupation: Therapist, sports  Tobacco Use   Smoking status: Former    Packs/day: 1.00    Years: 35.00    Total pack years: 35.00    Types: Cigarettes    Quit date: 08/09/2006    Years since quitting: 15.8    Passive exposure: Never   Smokeless tobacco: Never  Vaping Use   Vaping Use: Never used  Substance and Sexual Activity   Alcohol use: Not Currently    Comment: rare   Drug use: No   Sexual activity: Never    Birth control/protection: Surgical

## 2022-07-03 NOTE — Progress Notes (Signed)
HPI: FU dyspnea and palpitations. Tilt table in 2016 felt consistent with neurocardiogenic syndrome. By report patient had a monitor May 2016 that showed no significant arrhythmia but did have sinus tachycardia. Cardiac CTA April 2021 showed no coronary disease and calcium score of 0.  Echocardiogram February 2023 showed normal LV function, small pericardial effusion and negative saline microcavitation study.  Transcranial Dopplers February 2023 negative.  Patient suffered a stroke in February 2023 and was found to have right internal carotid artery dissection.  She was treated with aspirin and Plavix and follow-up study showed improvement.  Since last seen, she has weight loss and fatigue.  She denies dyspnea, chest pain or syncope.  Occasional palpitations but are reasonly well controlled.  Some dizziness at times but no syncope.  Current Outpatient Medications  Medication Sig Dispense Refill   acetaminophen (TYLENOL) 500 MG tablet Take 500 mg by mouth every 6 (six) hours as needed for mild pain, fever or headache.     Atogepant 60 MG TABS Take 1 tablet (60 mg) by mouth daily. 30 tablet 6   atorvastatin (LIPITOR) 40 MG tablet Take 1 tablet (40 mg total) by mouth daily. 90 tablet 1   Calcium Carbonate-Vitamin D 600-400 MG-UNIT tablet Take 1 tablet by mouth 2 (two) times daily. 60 tablet 11   Coenzyme Q10 (CO Q 10 PO) Take 1 tablet by mouth daily.     denosumab (PROLIA) 60 MG/ML SOSY injection Inject 60 mg into the skin every 6 (six) months. 1 mL 1   escitalopram (LEXAPRO) 10 MG tablet TAKE 1 TABLET BY MOUTH ONCE DAILY 90 tablet 1   famotidine (PEPCID) 20 MG tablet Take 1 tablet (20 mg total) by mouth daily. 30 tablet 0   methocarbamol (ROBAXIN) 500 MG tablet Take 1 tablet (500 mg total) by mouth every 8 (eight) hours as needed. (Patient taking differently: Take 500 mg by mouth every 8 (eight) hours as needed for muscle spasms.) 30 tablet 0   nadolol (CORGARD) 40 MG tablet Take 1 tablet (40 mg  total) by mouth daily. 90 tablet 3   ondansetron (ZOFRAN) 4 MG tablet Take 1 tablet (4 mg total) by mouth every 8 (eight) hours as needed for nausea or vomiting. 20 tablet 0   ondansetron (ZOFRAN-ODT) 4 MG disintegrating tablet Take 1 tablet (4 mg total) by mouth every 6 (six) hours as needed for nausea or vomiting. 30 tablet 2   oxybutynin (DITROPAN XL) 5 MG 24 hr tablet Take 1 tablet (5 mg total) by mouth at bedtime. 90 tablet 1   oxyCODONE-acetaminophen (PERCOCET) 5-325 MG tablet Take 1 tablet by mouth every 4 (four) hours as needed for severe pain. 30 tablet 0   pantoprazole (PROTONIX) 40 MG tablet TAKE 1 TABLET BY MOUTH ONCE DAILY 30 tablet 6   polycarbophil (FIBERCON) 625 MG tablet Take 625 mg by mouth daily.     Riboflavin 400 MG CAPS Take 400 mg by mouth daily.     Topiramate ER (TROKENDI XR) 200 MG CP24 Take 1 capsule (200 mg) by mouth daily. 90 capsule 3   traZODone (DESYREL) 50 MG tablet TAKE 1/2 - 1 TABLET BY MOUTH EVERY NIGHT AT BEDTIME AS NEEDED FOR SLEEP 30 tablet 3   Vitamin D, Ergocalciferol, (DRISDOL) 1.25 MG (50000 UNIT) CAPS capsule Take 1 capsule (50,000 Units total) by mouth every 7 (seven) days. Take for 8 total doses(weeks) 8 capsule 0   No current facility-administered medications for this visit.  Past Medical History:  Diagnosis Date   Anal fissure    Anemia    in early 20's   Anxiety    Arthritis    back   Breast calcifications on mammogram    Cancer ([place])    pre cancer colon,cervix   Closed right ankle fracture    Colon polyp    CVA (cerebral vascular accident) ([place])    Dyslipidemia 06/24/2018   Elevated cholesterol    Facet arthropathy, lumbosacral 08/28/2017   Family history of adverse reaction to anesthesia    sibling has N/V   GERD (gastroesophageal reflux disease)    Heart murmur    History of colon polyps    History of kidney stones    has small ones   IBS (irritable bowel syndrome)    Inappropriate sinus tachycardia    Migraines     Osteopenia    Osteoporosis of femur without pathological fracture 08/28/2017   Palpitations    PONV (postoperative nausea and vomiting)    Premature surgical menopause    PTSD (post-traumatic stress disorder)    Right wrist fracture    Sleep apnea    Stroke [facility])     Past Surgical History:  Procedure Laterality Date   ANAL FISSURE REPAIR     APPENDECTOMY  2000   COLONOSCOPY W/ POLYPECTOMY  03/2015   COLONOSCOPY WITH ESOPHAGOGASTRODUODENOSCOPY (EGD)  04/01/2015   KNEE SURGERY     OOPHORECTOMY     OOPHORECTOMY     1995, 1998   ORIF ANKLE FRACTURE Right 11/28/2021   Procedure: OPEN REDUCTION INTERNAL FIXATION (ORIF) ANKLE FRACTURE;  Surgeon: Meredith Pel, MD;  Location: [facility];  Service: Orthopedics;  Laterality: Right;   PELVIC LAPAROSCOPY     x 4 for endometriosis    TONSILLECTOMY     VAGINAL HYSTERECTOMY  1994   WISDOM TOOTH EXTRACTION      Social History   Socioeconomic History   Marital status: Divorced    Spouse name: Not on file   Number of children: 1   Years of education: Not on file   Highest education level: Master's degree (e.g., MA, MS, MEng, MEd, MSW, MBA)  Occupational History   Occupation: RN  Tobacco Use   Smoking status: Former    Packs/day: 1.00    Years: 35.00    Total pack years: 35.00    Types: Cigarettes    Quit date: 08/09/2006    Years since quitting: 15.9    Passive exposure: Never   Smokeless tobacco: Never  Vaping Use   Vaping Use: Never used  Substance and Sexual Activity   Alcohol use: Not Currently    Comment: rare   Drug use: No   Sexual activity: Never    Birth control/protection: Surgical  Other Topics Concern   Not on file  Social History Narrative   Single, livevs alone in a 2 story house. Drinks two sodas a day. Does not exercise regularly.   Social Determinants of Health   Financial Resource Strain: Not on file  Food Insecurity: Not on file  Transportation Needs: Not on file  Physical Activity: Not on file   Stress: Not on file  Social Connections: Not on file  Intimate Partner Violence: Not on file    Family History  Problem Relation Age of Onset   Colon cancer Mother    Breast cancer Mother 34       and liver   Migraines Mother    Hyperlipidemia Father  Alcohol abuse Father    Heart attack Father    Cancer Father        Head and neck    Breast cancer Sister    Migraines Sister    Migraines Sister    Kidney cancer Sister    Renal cancer Sister    Hyperlipidemia Brother    Atrial fibrillation Brother    Lung cancer Maternal Grandmother    Parkinson's disease Maternal Grandfather    Dementia Maternal Grandfather    Prostate cancer Maternal Grandfather    Parkinson's disease Paternal Grandmother    Heart attack Paternal Grandfather    Esophageal cancer Neg Hx    Stroke Neg Hx     ROS: no fevers or chills, productive cough, hemoptysis, dysphasia, odynophagia, melena, hematochezia, dysuria, hematuria, rash, seizure activity, orthopnea, PND, pedal edema, claudication. Remaining systems are negative.  Physical Exam: Well-developed thin in no acute distress.  Skin is warm and dry.  HEENT is normal.  Neck is supple.  Chest is clear to auscultation with normal expansion.  Cardiovascular exam is regular rate and rhythm.  Abdominal exam nontender or distended. No masses palpated. Extremities show no edema. neuro grossly intact  ECG-normal sinus rhythm, nonspecific ST changes.  Personally reviewed  A/P  1 dyspnea-previous evaluation unrevealing from a cardiac standpoint including normal LV function noted on echocardiogram and CTA showing no coronary disease.  We will not pursue further ischemia evaluation.  2 palpitations-continue beta-blocker.  3 hyperlipidemia-continue statin.  4 prior CVA-felt secondary to right internal carotid artery dissection which was improved on follow-up CTA.  Continue present medications.  Kirk Ruths, MD

## 2022-07-09 ENCOUNTER — Ambulatory Visit (HOSPITAL_COMMUNITY)
Admission: RE | Admit: 2022-07-09 | Discharge: 2022-07-09 | Disposition: A | Discharge status: Home / Self Care | Payer: No Typology Code available for payment source | Source: Ambulatory Visit | Attending: Gastroenterology | Admitting: Gastroenterology

## 2022-07-09 DIAGNOSIS — K582 Mixed irritable bowel syndrome: Secondary | ICD-10-CM | POA: Insufficient documentation

## 2022-07-09 DIAGNOSIS — K219 Gastro-esophageal reflux disease without esophagitis: Secondary | ICD-10-CM | POA: Insufficient documentation

## 2022-07-09 DIAGNOSIS — R1032 Left lower quadrant pain: Secondary | ICD-10-CM | POA: Diagnosis present

## 2022-07-09 DIAGNOSIS — R112 Nausea with vomiting, unspecified: Secondary | ICD-10-CM | POA: Diagnosis present

## 2022-07-09 DIAGNOSIS — K449 Diaphragmatic hernia without obstruction or gangrene: Secondary | ICD-10-CM | POA: Insufficient documentation

## 2022-07-09 MED ORDER — SODIUM CHLORIDE (PF) 0.9 % IJ SOLN
INTRAMUSCULAR | Status: AC
  Filled 2022-07-09: qty 50

## 2022-07-09 MED ORDER — IOHEXOL 300 MG/ML  SOLN
100.0000 mL | Freq: Once | INTRAMUSCULAR | Status: AC | PRN
  Administered 2022-07-09: 100 mL via INTRAVENOUS

## 2022-07-11 ENCOUNTER — Ambulatory Visit (INDEPENDENT_AMBULATORY_CARE_PROVIDER_SITE_OTHER): Payer: No Typology Code available for payment source | Admitting: Cardiology

## 2022-07-11 ENCOUNTER — Encounter: Payer: Self-pay | Admitting: Cardiology

## 2022-07-11 VITALS — BP 105/73 | HR 68 | Ht 67.0 in | Wt 112.1 lb

## 2022-07-11 DIAGNOSIS — R002 Palpitations: Secondary | ICD-10-CM | POA: Diagnosis not present

## 2022-07-11 DIAGNOSIS — E7800 Pure hypercholesterolemia, unspecified: Secondary | ICD-10-CM

## 2022-07-11 DIAGNOSIS — R0609 Other forms of dyspnea: Secondary | ICD-10-CM | POA: Diagnosis not present

## 2022-07-11 NOTE — Patient Instructions (Signed)
  Follow-Up: At [facility], you and your health needs are our priority.  As part of our continuing mission to provide you with exceptional heart care, we have created designated Provider Care Teams.  These Care Teams include your primary Cardiologist (physician) and Advanced Practice Providers (APPs -  Physician Assistants and Nurse Practitioners) who all work together to provide you with the care you need, when you need it.  We recommend signing up for the patient portal called "MyChart".  Sign up information is provided on this After Visit Summary.  MyChart is used to connect with patients for Virtual Visits (Telemedicine).  Patients are able to view lab/test results, encounter notes, upcoming appointments, etc.  Non-urgent messages can be sent to your provider as well.   To learn more about what you can do with MyChart, go to [email or web address].    Your next appointment:   12 month(s)  The format for your next appointment:   In Person  Provider:   Brian Crenshaw, MD   

## 2022-07-18 ENCOUNTER — Other Ambulatory Visit (HOSPITAL_COMMUNITY): Payer: Self-pay

## 2022-07-25 ENCOUNTER — Other Ambulatory Visit: Payer: Self-pay | Admitting: Medical

## 2022-07-26 ENCOUNTER — Other Ambulatory Visit (HOSPITAL_BASED_OUTPATIENT_CLINIC_OR_DEPARTMENT_OTHER): Payer: Self-pay

## 2022-07-26 MED ORDER — FAMOTIDINE 20 MG PO TABS
20.0000 mg | ORAL_TABLET | Freq: Every day | ORAL | 0 refills | Status: DC
  Filled 2022-07-26: qty 30, 30d supply, fill #0

## 2022-07-26 MED ORDER — ESCITALOPRAM OXALATE 10 MG PO TABS
10.0000 mg | ORAL_TABLET | Freq: Every day | ORAL | 0 refills | Status: DC
  Filled 2022-07-26: qty 30, 30d supply, fill #0

## 2022-07-30 ENCOUNTER — Telehealth (INDEPENDENT_AMBULATORY_CARE_PROVIDER_SITE_OTHER): Payer: No Typology Code available for payment source | Admitting: Neurology

## 2022-07-30 ENCOUNTER — Other Ambulatory Visit (HOSPITAL_BASED_OUTPATIENT_CLINIC_OR_DEPARTMENT_OTHER): Payer: Self-pay

## 2022-07-30 DIAGNOSIS — G43009 Migraine without aura, not intractable, without status migrainosus: Secondary | ICD-10-CM | POA: Insufficient documentation

## 2022-07-30 MED ORDER — ATOGEPANT 60 MG PO TABS
60.0000 mg | ORAL_TABLET | Freq: Every day | ORAL | 4 refills | Status: AC
  Filled 2022-07-30: qty 90, 90d supply, fill #0
  Filled 2022-09-16: qty 30, 30d supply, fill #0
  Filled 2022-10-11: qty 30, 30d supply, fill #1

## 2022-07-30 MED ORDER — TOPIRAMATE ER 200 MG PO CAP24
200.0000 mg | ORAL_CAPSULE | Freq: Every day | ORAL | 4 refills | Status: AC
  Filled 2022-07-30 – 2022-09-16 (×2): qty 90, 90d supply, fill #0

## 2022-07-30 NOTE — Progress Notes (Signed)
[facility]  Virtual Visit via Video Note  I connected with Alice Williams on 07/30/22 at  1:00 PM EDT by a video enabled telemedicine application and verified that I am speaking with the correct person using two identifiers.  Location: Patient: home Provider: office   I discussed the limitations of evaluation and management by telemedicine and the availability of in person appointments. The patient expressed understanding and agreed to proceed.  Follow Up Instructions:    I discussed the assessment and treatment plan with the patient. The patient was provided an opportunity to ask questions and all were answered. The patient agreed with the plan and demonstrated an understanding of the instructions.   The patient was advised to call back or seek an in-person evaluation if the symptoms worsen or if the condition fails to improve as anticipated.  I provided over 25 minutes of non-face-to-face time during this encounter.   Melvenia Beam, MD   Provider:  Dr Jaynee Eagles Requesting Provider: Elise Benne Primary Care Provider:  Mackie Pai, PA-C  CC:  Chronic migraines, carotid dissection, right MCA stroke  07/30/2022: here for follow up of migraines, she also had a carotid dissection. Follow up CTA in April showed much improvement. She has been doing well on her migraine therapy as well. Still on qulipta and topamax and not the botox. She is not on Vyepti anymore had 2 infusions. Since the stroke she hasn't had many migraines and she has done well since then. She has had some incontinence issues from the stroke and oxybutynin has helped. She is getting a workup for N/V and weight loss. She feels flat, no tremor, improvement in gait since the right ankle and wrist fracture. No sadness. No depression. She is not using her cpap, she had a few panic attacks with it.  01/2022: Improved caliber and decreased irregularity of the right cervical ICA compared to  12/14/2021 and 12/07/2021 CTA studies. Although they were apparently not compared at the time, there was already some improvement between the two February studies. There is no longer any stenosis.   Right MCA occlusion seen on 12/07/2021 is no longer present.   No new findings.  Patient complains of symptoms per HPI as well as the following symptoms: panic attacks . Pertinent negatives and positives per HPI. All others negative   01/23/2022: Is here for follow-up of carotid dissection 12/07/2021.  I reviewed notes from patient's admission, she was admitted in February 2023, she stated that 2 weeks prior she had had a bad fall, tripped over the right leg with right ankle fracture and wrist fracture, the day before admission she had significant pain in the right neck and pulsatile tinnitus in the right ear.  CTA showed right ICA 70% stenosis with circumferential fibrofatty plaque and possible adherent thrombus, MRA of the neck showed mid cervical right ICA with extensive peripheral mural thrombus more likely hematoma from dissection, also right MCA embolic stroke.  Patient was started on dual antiplatelet therapy.  She is here today for follow-up.  Surprisingly she is doing extremely well, she is following with orthopedics for her closed right ankle fracture of which she underwent fixation of the bimalleolar portion of the fracture, she was nonweightbearing but is now using a walker.  We reviewed MRI of the brain, CTAs, discussed carotid dissection, she is actually doing extremely well considering, I did not appreciate any significant left-sided weakness, however she is still having difficulty with ambulation due to her fracture of her leg  and right wrist.Therapy is helping her, her voice is still a little weak, and the right leg from the fall.  She is still on the dual antiplatelet. The neck pain is gone and the pulsating is gone, she has not had a migraine since the fall while on vyepti.  Personally  reviewed imaging and agree with the following:  MRI brain (reviewed images with patient as well)  IMPRESSION: 1. Patchy acute ischemic nonhemorrhagic right MCA distribution infarct as above. No associated mass effect. 2. Abnormal flow void within the petrous and cavernous right ICA, which could be related to slow flow and/or occlusion. 3. Otherwise normal brain MRI for age.   CTA H&N 2/16 EXAM:  CT ANGIOGRAPHY HEAD AND NECK   TECHNIQUE:  Multidetector CT imaging of the head and neck was performed using  the standard protocol during bolus administration of intravenous  contrast. Multiplanar CT image reconstructions and MIPs were  obtained to evaluate the vascular anatomy. Carotid stenosis  measurements (when applicable) are obtained utilizing NASCET  criteria, using the distal internal carotid diameter as the  denominator.   RADIATION DOSE REDUCTION: This exam was performed according to the  departmental dose-optimization program which includes automated  exposure control, adjustment of the mA and/or kV according to  patient size and/or use of iterative reconstruction technique.   CONTRAST:  64m OMNIPAQUE IOHEXOL 350 MG/ML SOLN   COMPARISON:  CT head 12/14/2021.  MRI head 12/07/2021   FINDINGS:  CTA NECK FINDINGS   Aortic arch: Standard branching. Imaged portion shows no evidence of  aneurysm or dissection. No significant stenosis of the major arch  vessel origins.   Right carotid system: Segmental narrowing of the right internal  carotid artery above the bifurcation. No associated calcification.  There is diffuse circumferential thickening of the wall of the  internal carotid in this area. No intraluminal thrombus. Probable  dissection. Estimated 50% diameter stenosis.   Left carotid system: Normal left carotid without atherosclerotic  disease or stenosis. No dissection.   Vertebral arteries: Both vertebral arteries patent to the skull base  without stenosis.    Skeleton: No acute skeletal abnormality.  Scoliosis.   Other neck: Negative for mass or edema in the neck.   Upper chest: Lung apices clear bilaterally.   Review of the MIP images confirms the above findings   CTA HEAD FINDINGS   Anterior circulation: Cavernous carotid widely patent bilaterally  without stenosis. Anterior and middle cerebral arteries patent  without stenosis or large vessel occlusion. No lesion to account for  the right MCA infarct.   Posterior circulation: Both vertebral arteries patent to the basilar  without stenosis. Left PICA patent. Right PICA not visualized.  Basilar widely patent. Superior cerebellar and posterior cerebral  arteries patent without stenosis.   Venous sinuses: Normal venous enhancement   Anatomic variants: None   Review of the MIP images confirms the above findings   IMPRESSION:  1. Negative for intracranial large vessel occlusion or significant  stenosis  2. Long segment stenosis of the right internal carotid artery which  begins approximately 4 cm above the bifurcation. There is  circumferential thickening of the wall of the artery in this area  which is most likely dissection. No dissection flap or thrombus  identified.  3. Left carotid normal.  Both vertebral arteries normal.  4. Code stroke imaging results were communicated on 12/14/2021 at  6:16 pm to provider Palikh via text page  CTA H&N 2/9:   CLINICAL DATA:  Stroke/TIA, determine  embolic source   EXAM: CT ANGIOGRAPHY HEAD AND NECK   TECHNIQUE: Multidetector CT imaging of the head and neck was performed using the standard protocol during bolus administration of intravenous contrast. Multiplanar CT image reconstructions and MIPs were obtained to evaluate the vascular anatomy. Carotid stenosis measurements (when applicable) are obtained utilizing NASCET criteria, using the distal internal carotid diameter as the denominator.   RADIATION DOSE REDUCTION: This exam  was performed according to the departmental dose-optimization program which includes automated exposure control, adjustment of the mA and/or kV according to patient size and/or use of iterative reconstruction technique.   CONTRAST:  17m OMNIPAQUE IOHEXOL 350 MG/ML SOLN   COMPARISON:  12/06/2021   FINDINGS: CT HEAD   Brain: There is no acute intracranial hemorrhage. Hypoattenuation with loss of gray-white differentiation is present in the right MCA territory involving parietal and temporal lobes and insula. There is also involvement of the body of the caudate and a portion of the lentiform nucleus. Ventricles and sulci are normal in size and configuration. No extra-axial collection.   Vascular: Linear hyperdensity in the area of infarction probably reflects intraluminal thrombus.   Skull: Calvarium is unremarkable.   Sinuses/Orbits: No acute finding.   Other: None.   Review of the MIP images confirms the above findings   CTA NECK   Aortic arch: Great vessel origins are patent.   Right carotid system: Patent. There is irregularity and narrowing of the mildly tortuous cervical ICA beyond the origin. Thickening along the wall reflecting fibrofatty plaque or mural thrombus. There is some exophytic plaque or adherent thrombus. Stenosis measures up to 70%.   Left carotid system: Patent.  No stenosis.   Vertebral arteries: Patent. Left vertebral is dominant. No stenosis.   Skeleton: Left facet predominant cervical spine degenerative changes.   Other neck: Unremarkable.   Upper chest: Included upper lungs are clear.   Review of the MIP images confirms the above findings   CTA HEAD   Anterior circulation: Intracranial internal carotid arteries are patent. Proximal right middle cerebral artery is patent. There is occlusion of a right M3 segment in the region of curvilinear hyperdensity on noncontrast CT. Left middle and both anterior cerebral arteries are patent.    Posterior circulation: Intracranial vertebral arteries are patent. Basilar artery is patent. Major cerebellar artery origins are patent. Posterior cerebral arteries are patent.   Venous sinuses: Patent as allowed by contrast bolus timing.   Review of the MIP images confirms the above findings   IMPRESSION: No acute intracranial hemorrhage. Evolving acute infarction of the right MCA territory involving parietal and temporal lobes, insula, and basal ganglia.   Abnormal appearance of the mid right cervical ICA with circumferential fibrofatty plaque resulting in 70% stenosis. There is a focal exophytic component that may reflect unstable plaque or adherent thrombus. Alternatively, this may represent dissection with mural thrombus.   Occlusion of a right M3 MCA branch. This corresponds to hyperdense vessel on noncontrast portion.       11/08/2021: Sleep study results: MPRESSION: This HST confirmed the presence of mild -moderate OSA (obstructive sleep apnea) without hypoxemia and with Strong REM sleep dependence. Mild snoring is indicated by RDI. Intermittent Bradycardia was noted. RECOMMENDATION: In a patient with sleep related headache, treatment of even mild sleep apnea is indicated. REM dependent apnea needs PAP therapy to be alleviated. The patient has retrognathia and should avoid a FFM. I recommend use of auto CPAP, with a small interface ( nasal pillow, nasal mask), and heated humidification.  Settings to be: 4-14 cm water, 2 cm EPR. She takes excedrin migraine 4x a month. She is also on [place]. Als on Topamax, nadolol. Not having daily headaches, tremendous improvement. She has blepharospasm. She gets botox elsewhere, discuss with her for blepharospasm.   CPAP is helping.   Migraines are improved. Still having some occ. sharp pains brief. Botox is helping. Having 3-4 moderate to severe migraine days a month.  Baseline was headaches every day, at least 15 migraine days a month.  Improved by 75% also the infusion every 3 months is helping.   HPI:  Alice Williams is a 61 y.o. female here as requested by Mackie Pai, PA-C for chronic migraines since childhood, been treated in the past by Dr. Tomi Likens and with [facility], the [facility], [facility] [place] neurology and [facility] neurology. She is currently a patient with a headache specialist at [facility] and had her last botox in December of 2021.   She has had migraines since the age of 26th, they started on the right and then the left, when she was [age] she separated from her husband, she started having nausea and vomiting, the eye itself would shake, nystagmus, there is pain behind the left eye, the whole side of her left face, she will have pain in the temples, gets numb left side of the face, migraines lately radiate to the back of the head and they go all over, pulsating/pounding/throbbing, last week and this week has been awful, zofran doesn't help, she has diarrhea. She has had multiple tests and they have been normal. The left-sided facial changes numbness in the left side of the face is new. She quit doing the emgality because the GI doctor thought it may be causing the nausea and vomiting, stopped it and nothng has changed. She tried all three of the injections, none helped. The botox has helped, it works for 6 weeks, the last month she start getting migraines again. She never had light sensitivity and now she gets that too, she gets ringing in the ears, smells trigger, ongoing for 5-6 years, she is having difficulty with speech and getting words to come out that is new. She has associated dizziness, and she rolled over in bed and the light came across and blinded her, everything was spinning, she is clumsy and in the last year feeling she is more clumsy, everything is "off", and then she feels when she falls over she feels imbalance. She has neck pain all the time. Speech changes with  the migraine have been ongoing for years, aphasia with the migraines. She has headaches every day, migraines can last 2-3 days, severe migraines 5-6 a month and moderateratly severe 10 migraine days a month. Botox has reduced the migraine burden by 50% but she still have 1/2 month of migraines prior was daily migraines. Nurtec helped for a while and not so much lately. Roselyn Meier did not work. She has positional headaches, head posture can often exacerbate, movement makes it worse, bending down can worsen headache. Reports arm weakness.   Reviewed notes, labs and imaging from outside physicians, which showed:  12/13/2020: sed rate, cbc and cmp nml,   CT head 12/20/2020 showed No acute intracranial abnormalities including mass lesion or mass effect, hydrocephalus, extra-axial fluid collection, midline shift, hemorrhage, or acute infarction, large ischemic events (personally reviewed images). Normal.   I reviewed neurology notes from [place], appears patient was seeing the headache center in [place] regularly, I reviewed those notes,  she was also receiving Botox regularly for over a year, she has had migraines since a child, pain is stabbing throbbing pulsating, and bandlike, associated symptoms include dizziness, nausea, numbness, phonophobia and vomiting, no vision changes, photophobia, rhinorrhea or visual changes.  Aggravated by stress.  Located in the bilateral, frontal, occipital, retro-orbital temporal and parietal regions.  She is also been followed by [facility] neurology Dr. Tomi Likens.  She did have Botox with Dr. Tomi Likens as well in 2019.  Last Botox at the [facility] appears to have been in June 2021.  She was also previously seen by [facility] neurology and by the [facility].  No aura.  Looks like she also had botox 10/14/2020 unclear where ([facility]) and continues to see them last appointment 10/14/2020 and at this appointment they reported the  botox doing very well.   From [facility] notes 12/2018 reviewed following and added items: CGRP: emgality, rimegepant(nurtec), Roselyn Meier Current and past medications ANALGESICS: tylenol, Excedrin, cafergot, Midol, cambia, ketorolac inj ANTI-MIGRAINE: sumatriptan (felt like I was going to die) HEART/BP: propranolol, nadolol DECONGESTANT/ANTIHISTAMINE: hydroxyzine, benadryl ANTI-NAUSEANT: phenergan, compazine, zofran NSAIDS: nabumetone, Etodolac, indocin, ibuprofen, naproxen, toradol inj, sprix MUSCLE RELAXANTS: tizanidine (felt weird), robaxin ANTI-CONVULSANTS: gabpaentin, topamax, trokendi STEROIDS: prednisone SLEEPING PILLS/TRANQUILIZERS: ANTI-DEPRESSANTS: amitriptyline, lexapro, sertraline HERBAL: melatonin, Mg, B2, CoQ10 FIBROMYALGIA:  HORMONAL: OTHER: Emgality (GI upset) PROCEDURES FOR HEADACHES: botox ( 3 treatments in 2019) The patient's allergies, current medications, past family history, past medical history, past social history, past surgical history and problem list were reviewed and updated as appropriate.  Past Medical History:  Diagnosis Date  ? Breast lump  ? Endometriosis  ? Heart palpitations  ? IBS (irritable bowel syndrome)  ? Migraines  ? Sinus tachycardia   Past Surgical History:  Procedure Laterality Date  ? Hysterectomy  ? Knee surgery Left  ? Tonsillectomy       Review of Systems: Patient complains of symptoms per HPI as well as the following symptoms: migraines . Pertinent negatives and positives per HPI. All others negative    Social History   Socioeconomic History   Marital status: Divorced    Spouse name: Not on file   Number of children: 1   Years of education: Not on file   Highest education level: Master's degree (e.g., MA, MS, MEng, MEd, MSW, MBA)  Occupational History   Occupation: RN  Tobacco Use   Smoking status: Former    Packs/day: 1.00    Years: 35.00    Total pack years: 35.00    Types: Cigarettes    Quit date: 08/09/2006     Years since quitting: 15.9    Passive exposure: Never   Smokeless tobacco: Never  Vaping Use   Vaping Use: Never used  Substance and Sexual Activity   Alcohol use: Not Currently    Comment: rare   Drug use: No   Sexual activity: Never    Birth control/protection: Surgical  Other Topics Concern   Not on file  Social History Narrative   Single, livevs alone in a 2 story house. Drinks two sodas a day. Does not exercise regularly.   Social Determinants of Health   Financial Resource Strain: Not on file  Food Insecurity: Not on file  Transportation Needs: Not on file  Physical Activity: Not on file  Stress: Not on file  Social Connections: Not on file  Intimate Partner Violence: Not on file    Family History  Problem Relation Age of Onset  Colon cancer Mother    Breast cancer Mother 14       and liver   Migraines Mother    Hyperlipidemia Father    Alcohol abuse Father    Heart attack Father    Cancer Father        Head and neck    Breast cancer Sister    Migraines Sister    Migraines Sister    Kidney cancer Sister    Renal cancer Sister    Hyperlipidemia Brother    Atrial fibrillation Brother    Lung cancer Maternal Grandmother    Parkinson's disease Maternal Grandfather    Dementia Maternal Grandfather    Prostate cancer Maternal Grandfather    Parkinson's disease Paternal Grandmother    Heart attack Paternal Grandfather    Esophageal cancer Neg Hx    Stroke Neg Hx     Past Medical History:  Diagnosis Date   Anal fissure    Anemia    in early 20's   Anxiety    Arthritis    back   Breast calcifications on mammogram    Cancer ([place])    pre cancer colon,cervix   Closed right ankle fracture    Colon polyp    CVA (cerebral vascular accident) ([place])    Dyslipidemia 06/24/2018   Elevated cholesterol    Facet arthropathy, lumbosacral 08/28/2017   Family history of adverse reaction to anesthesia    sibling has N/V   GERD (gastroesophageal reflux disease)     Heart murmur    History of colon polyps    History of kidney stones    has small ones   IBS (irritable bowel syndrome)    Inappropriate sinus tachycardia    Migraines    Osteopenia    Osteoporosis of femur without pathological fracture 08/28/2017   Palpitations    PONV (postoperative nausea and vomiting)    Premature surgical menopause    PTSD (post-traumatic stress disorder)    Right wrist fracture    Sleep apnea    Stroke [facility])     Patient Active Problem List   Diagnosis Date Noted   Migraine without aura and without status migrainosus, not intractable 07/30/2022   Contracture of finger joint, right 06/26/2022   Bilateral hand pain 06/26/2022   Positive ANA (antinuclear antibody) 06/26/2022   Migraine with aura and without status migrainosus, not intractable 02/23/2022   Acute right MCA stroke ([place]) 12/07/2021   GERD without esophagitis 12/07/2021   Right wrist fracture, with routine healing, subsequent encounter 12/07/2021   Bimalleolar ankle fracture, right, closed, with routine healing, subsequent encounter    Blepharospasm 11/08/2021   Sleep disorder, circadian, shift work type 08/01/2021   Excessive daytime sleepiness 08/01/2021   Morning headache 02/16/2021   Left eye pain 02/16/2021   Intractable episodic cluster headache 02/16/2021   Behavioral insomnia of childhood 02/16/2021   Retrognathia 02/16/2021   Chronic migraine without aura, with intractable migraine, so stated, with status migrainosus 01/12/2021   Muscle spasm 10/14/2020   Vitamin D deficiency 08/15/2020   Acute bilateral low back pain with bilateral sciatica 07/13/2020   Gastric polyp 02/11/2019   Sessile colonic polyp 02/11/2019   Chronic fatigue 02/11/2019   Grade I internal hemorrhoids 02/11/2019   Ductal hyperplasia of breast 08/21/2018   Cognitive changes 06/24/2018   Dyslipidemia 06/24/2018   Dense breast tissue on mammogram 04/17/2018   Fibrocystic breast determined by biopsy 04/09/2018    Family history of breast cancer in mother 04/09/2018  Paroxysmal sinus tachycardia 03/27/2018   Osteoporosis 08/28/2017   Facet arthropathy, lumbosacral 08/28/2017   Chronic left SI joint pain 08/27/2017   Left lumbar radiculopathy 08/27/2017   Allergic rhinitis 08/09/2017   Disorder of lipoid metabolism 08/09/2017   Insomnia secondary to anxiety 08/09/2017   Postmenopausal osteoporosis 08/09/2017   History of hysterectomy for benign disease 08/09/2017   GAD (generalized anxiety disorder) 08/09/2017   Former smoker, stopped smoking in distant past 08/09/2017   Minor depressive disorder 08/09/2017   Palpitations 03/16/2015   Family history of colon cancer 03/02/2015   Hiatal hernia with GERD without esophagitis 03/02/2015   History of adenomatous polyp of colon 03/02/2015   Chronic migraine without aura 10/10/2014   History of cardiovascular disorder 01/18/2003    Past Surgical History:  Procedure Laterality Date   ANAL FISSURE REPAIR     APPENDECTOMY  2000   COLONOSCOPY W/ POLYPECTOMY  03/2015   COLONOSCOPY WITH ESOPHAGOGASTRODUODENOSCOPY (EGD)  04/01/2015   KNEE SURGERY     OOPHORECTOMY     OOPHORECTOMY     1995, 1998   ORIF ANKLE FRACTURE Right 11/28/2021   Procedure: OPEN REDUCTION INTERNAL FIXATION (ORIF) ANKLE FRACTURE;  Surgeon: Meredith Pel, MD;  Location: [facility];  Service: Orthopedics;  Laterality: Right;   PELVIC LAPAROSCOPY     x 4 for endometriosis    TONSILLECTOMY     VAGINAL HYSTERECTOMY  1994   WISDOM TOOTH EXTRACTION      Current Outpatient Medications  Medication Sig Dispense Refill   acetaminophen (TYLENOL) 500 MG tablet Take 500 mg by mouth every 6 (six) hours as needed for mild pain, fever or headache.     Atogepant 60 MG TABS Take 1 tablet (60 mg) by mouth daily. 90 tablet 4   atorvastatin (LIPITOR) 40 MG tablet Take 1 tablet (40 mg total) by mouth daily. 90 tablet 1   Calcium Carbonate-Vitamin D 600-400 MG-UNIT tablet Take 1 tablet by mouth  2 (two) times daily. 60 tablet 11   Coenzyme Q10 (CO Q 10 PO) Take 1 tablet by mouth daily.     denosumab (PROLIA) 60 MG/ML SOSY injection Inject 60 mg into the skin every 6 (six) months. 1 mL 1   escitalopram (LEXAPRO) 10 MG tablet Take 1 tablet (10 mg total) by mouth daily. 30 tablet 0   famotidine (PEPCID) 20 MG tablet Take 1 tablet (20 mg total) by mouth daily. 30 tablet 0   nadolol (CORGARD) 40 MG tablet Take 1 tablet (40 mg total) by mouth daily. 90 tablet 3   ondansetron (ZOFRAN) 4 MG tablet Take 1 tablet (4 mg total) by mouth every 8 (eight) hours as needed for nausea or vomiting. 20 tablet 0   ondansetron (ZOFRAN-ODT) 4 MG disintegrating tablet Take 1 tablet (4 mg total) by mouth every 6 (six) hours as needed for nausea or vomiting. 30 tablet 2   oxybutynin (DITROPAN XL) 5 MG 24 hr tablet Take 1 tablet (5 mg total) by mouth at bedtime. 90 tablet 1   oxyCODONE-acetaminophen (PERCOCET) 5-325 MG tablet Take 1 tablet by mouth every 4 (four) hours as needed for severe pain. 30 tablet 0   pantoprazole (PROTONIX) 40 MG tablet TAKE 1 TABLET BY MOUTH ONCE DAILY 30 tablet 6   polycarbophil (FIBERCON) 625 MG tablet Take 625 mg by mouth daily.     Riboflavin 400 MG CAPS Take 400 mg by mouth daily.     Topiramate ER (TROKENDI XR) 200 MG CP24 Take 1 capsule (200  mg) by mouth daily. 90 capsule 4   traZODone (DESYREL) 50 MG tablet TAKE 1/2 - 1 TABLET BY MOUTH EVERY NIGHT AT BEDTIME AS NEEDED FOR SLEEP 30 tablet 3   Vitamin D, Ergocalciferol, (DRISDOL) 1.25 MG (50000 UNIT) CAPS capsule Take 1 capsule (50,000 Units total) by mouth every 7 (seven) days. Take for 8 total doses(weeks) 8 capsule 0   No current facility-administered medications for this visit.    Allergies as of 07/30/2022 - Review Complete 07/11/2022  Allergen Reaction Noted   Sulfa antibiotics Anaphylaxis 11/22/2011   Sumatriptan succinate Anaphylaxis 12/09/2015   Emgality [galcanezumab-gnlm] Nausea Only 02/11/2019    Vitals: There  were no vitals taken for this visit. Last Weight:  Wt Readings from Last 1 Encounters:  07/11/22 112 lb 1.9 oz (50.9 kg)   Last Height:   Ht Readings from Last 1 Encounters:  07/11/22 '5\' 7"'$  (1.702 m)   Today:  Physical exam: Exam: Gen: NAD, conversant      CV:  Denies palpitations or chest pain or SOB. VS: Breathing at a normal rate.. Not febrile. Eyes: Conjunctivae clear without exudates or hemorrhage  Neuro: Detailed Neurologic Exam  Speech:    Speech is normal; fluent and spontaneous with normal comprehension. Flat affect Cognition:    The patient is oriented to person, place, and time;     recent and remote memory intact;     language fluent;     normal attention, concentration,     fund of knowledge Cranial Nerves:     Visual fields are full. Extraocular movements are intact.  The face is symmetric.  Hearing intact. Voice is normal.   Motor Observation:   no involuntary movements noted. Tone:    Appears normal     Sensation: intact to LT      Prior in-office exam: Physical exam: Exam: Gen: NAD, conversant      CV:  Denies palpitations or chest pain or SOB. VS: Breathing at a normal rate. Weight appears within normal limits. Not febrile. Eyes: Conjunctivae clear without exudates or hemorrhage  Neuro: Detailed Neurologic Exam  Speech:    Speech is normal; fluent and spontaneous with normal comprehension.  Cognition:    The patient is oriented to person, place, and time;     recent and remote memory intact;     language fluent;     normal attention, concentration,     fund of knowledge Cranial Nerves:    The pupils are equal, round, and reactive to light. Cannot perform fundoscopic exam. Visual fields are full to finger confrontation. Extraocular movements are intact.  The face is symmetric with normal sensation. The palate elevates in the midline. Hearing intact. Voice is normal. Shoulder shrug is normal. The tongue has normal motion without  fasciculations.   Motor Observation:   no involuntary movements noted. Tone:    Appears normal  Posture:    Posture is normal. normal erect    Strength:    Strength is anti-gravity and symmetric in the upper and lower limbs.      Sensation: intact to LT     Gait: narrow and antalgic with walker due to fracturs.      Assessment/Plan:  61 y.o. female here as requested by Mackie Pai, PA-C for chronic migraines since childhood, been treated in the past by Dr. Tomi Likens and with [facility], the [facility] center, [facility] [place] neurology and [facility] neurology, a headache specialist at [facility]  We had a long  conversation, she has been to many neurologists but now doing extremely well on qulipta and topiramate  MIGRAINE:   CPAP is helping. Mild to moderate sleep apnea. She feels flat, no tremor, improvement in gait since the right ankle and wrist fracture. No sadness. No depression. She is not using her cpap, she had a few panic attacks with it. I would recommend using the cpap, she has an appointment on Wednesday recommend talking to them about that and the flatness could be fatigue (sleeping all the time). Could also try decreasing the Trokendi to '150mg'$  as well for fatigue but can try the cpap first.  2. Migraines are improved. Still having some occ. sharp pains brief. Botox once gave her a migraine so she stopped. She has 2 vyepti infusions and stopped. Now doing great on Qulipta. Continue Qulipta. Having 4 migraine days a month that resolve on their own on qulipta.  4. In the past discussed [place] Cephaly, dry needling, seeing ophthalmology  Carotid Dissection: 12/07/2021.  she stated that 2 weeks prior she had had a bad fall, tripped over the right leg with right ankle fracture and wrist fracture, the day before admission she had significant pain in the right neck and pulsatile tinnitus in the right ear.  CTA showed right ICA 70% stenosis with  circumferential fibrofatty plaque and possible adherent thrombus, MRA of the neck showed mid cervical right ICA with extensive peripheral mural thrombus more likely hematoma from dissection, also right MCA embolic stroke: Follow up CTA in April showed much improvement and she has minimal residual.  Meds ordered this encounter  Medications   Atogepant 60 MG TABS    Sig: Take 1 tablet (60 mg) by mouth daily.    Dispense:  90 tablet    Refill:  4   Topiramate ER (TROKENDI XR) 200 MG CP24    Sig: Take 1 capsule (200 mg) by mouth daily.    Dispense:  90 capsule    Refill:  4     Cc: Saguier, Iris Pert,  Saguier, Impact, PA-C  Sarina Ill, MD  [facility] [address] [place], [place] [address]  Phone [phone] Fax [phone]

## 2022-08-01 ENCOUNTER — Encounter: Payer: Self-pay | Admitting: Adult Health

## 2022-08-01 ENCOUNTER — Ambulatory Visit (INDEPENDENT_AMBULATORY_CARE_PROVIDER_SITE_OTHER): Payer: No Typology Code available for payment source | Admitting: Adult Health

## 2022-08-01 VITALS — BP 108/67 | HR 64 | Ht 67.0 in | Wt 118.6 lb

## 2022-08-01 DIAGNOSIS — G4733 Obstructive sleep apnea (adult) (pediatric): Secondary | ICD-10-CM

## 2022-08-01 NOTE — Progress Notes (Signed)
PATIENT: Alice Williams DOB: 07-Jul-1961  REASON FOR VISIT: follow up HISTORY FROM: patient PRIMARY NEUROLOGIST: Dr. Brett Fairy  Chief Complaint  Patient presents with   Follow-up    Pt in 19 alone Pt here for CPAP f/u Pt states hasn't used CPAP often Pt states hard for her to wear mask . Pt states CVA 11/2021 Pt states willing to try again .      HISTORY OF PRESENT ILLNESS: Today 08/01/22   Ms. Alice Williams is a 61 year old female with a history of obstructive sleep apnea on CPAP.  She returns today for follow-up.  She states that she has not been using the CPAP recently.  Her last download shows that her last usage was in April.  But only for 7 days that month.  When she uses the machine it appears that her apnea is well treated at 1.9.  Current pressure is set at 4 to 14 cm of water.  Patient is interested in getting restarted on CPAP therapy.  REVIEW OF SYSTEMS: Out of a complete 14 system review of symptoms, the patient complains only of the following symptoms, and all other reviewed systems are negative.  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     Atogepant 60 MG TABS Take 1 tablet (60 mg) by mouth daily. 90 tablet 4   atorvastatin (LIPITOR) 40 MG tablet Take 1 tablet (40 mg total) by mouth daily. 90 tablet 1   Calcium Carbonate-Vitamin D 600-400 MG-UNIT tablet Take 1 tablet by mouth 2 (two) times daily. 60 tablet 11   Coenzyme Q10 (CO Q 10 PO) Take 1 tablet by mouth daily.     denosumab (PROLIA) 60 MG/ML SOSY injection Inject 60 mg into the skin every 6 (six) months. 1 mL 1   escitalopram  (LEXAPRO) 10 MG tablet Take 1 tablet (10 mg total) by mouth daily. 30 tablet 0   famotidine (PEPCID) 20 MG tablet Take 1 tablet (20 mg total) by mouth daily. 30 tablet 0   nadolol (CORGARD) 40 MG tablet Take 1 tablet (40 mg total) by mouth daily. 90 tablet 3   ondansetron (ZOFRAN) 4 MG tablet Take 1 tablet (4 mg total) by mouth every 8 (eight) hours as needed for nausea or vomiting. 20 tablet 0   ondansetron (ZOFRAN-ODT) 4 MG disintegrating tablet Take 1 tablet (4 mg total) by mouth every 6 (six) hours as needed for nausea or vomiting. 30 tablet 2   oxybutynin (DITROPAN XL) 5 MG 24 hr tablet Take 1 tablet (5 mg total) by mouth at bedtime. 90 tablet 1   oxyCODONE-acetaminophen (PERCOCET) 5-325 MG tablet Take 1 tablet by mouth every 4 (four) hours as needed for severe pain. 30 tablet 0   pantoprazole (PROTONIX) 40 MG tablet TAKE 1 TABLET BY MOUTH ONCE DAILY 30 tablet 6   polycarbophil (FIBERCON) 625 MG tablet Take 625 mg by mouth daily.     Riboflavin 400 MG CAPS Take 400 mg by mouth daily.     Topiramate ER (TROKENDI XR) 200 MG CP24 Take 1 capsule (200 mg) by mouth  daily. 90 capsule 4   traZODone (DESYREL) 50 MG tablet TAKE 1/2 - 1 TABLET BY MOUTH EVERY NIGHT AT BEDTIME AS NEEDED FOR SLEEP 30 tablet 3   Vitamin D, Ergocalciferol, (DRISDOL) 1.25 MG (50000 UNIT) CAPS capsule Take 1 capsule (50,000 Units total) by mouth every 7 (seven) days. Take for 8 total doses(weeks) 8 capsule 0   No facility-administered medications prior to visit.    PAST MEDICAL HISTORY: Past Medical History:  Diagnosis Date   Anal fissure    Anemia    in early 20's   Anxiety    Arthritis    back   Breast calcifications on mammogram    Cancer ([place])    pre cancer colon,cervix   Closed right ankle fracture    Colon polyp    CVA (cerebral vascular accident) ([place])    Dyslipidemia 06/24/2018   Elevated cholesterol    Facet arthropathy, lumbosacral 08/28/2017   Family history of adverse reaction to anesthesia     sibling has N/V   GERD (gastroesophageal reflux disease)    Heart murmur    History of colon polyps    History of kidney stones    has small ones   IBS (irritable bowel syndrome)    Inappropriate sinus tachycardia    Migraines    Osteopenia    Osteoporosis of femur without pathological fracture 08/28/2017   Palpitations    PONV (postoperative nausea and vomiting)    Premature surgical menopause    PTSD (post-traumatic stress disorder)    Right wrist fracture    Sleep apnea    Stroke [facility])     PAST SURGICAL HISTORY: Past Surgical History:  Procedure Laterality Date   ANAL FISSURE REPAIR     APPENDECTOMY  2000   COLONOSCOPY W/ POLYPECTOMY  03/2015   COLONOSCOPY WITH ESOPHAGOGASTRODUODENOSCOPY (EGD)  04/01/2015   KNEE SURGERY     OOPHORECTOMY     OOPHORECTOMY     1995, 1998   ORIF ANKLE FRACTURE Right 11/28/2021   Procedure: OPEN REDUCTION INTERNAL FIXATION (ORIF) ANKLE FRACTURE;  Surgeon: Meredith Pel, MD;  Location: [place];  Service: Orthopedics;  Laterality: Right;   PELVIC LAPAROSCOPY     x 4 for endometriosis    TONSILLECTOMY     VAGINAL HYSTERECTOMY  1994   WISDOM TOOTH EXTRACTION      FAMILY HISTORY: Family History  Problem Relation Age of Onset   Colon cancer Mother    Breast cancer Mother 95       and liver   Migraines Mother    Hyperlipidemia Father    Alcohol abuse Father    Heart attack Father    Cancer Father        Head and neck    Breast cancer Sister    Migraines Sister    Migraines Sister    Kidney cancer Sister    Renal cancer Sister    Hyperlipidemia Brother    Atrial fibrillation Brother    Sleep apnea Brother    Lung cancer Maternal Grandmother    Parkinson's disease Maternal Grandfather    Dementia Maternal Grandfather    Prostate cancer Maternal Grandfather    Parkinson's disease Paternal Grandmother    Heart attack Paternal Grandfather    Esophageal cancer Neg Hx    Stroke Neg Hx     SOCIAL HISTORY: Social History    Socioeconomic History   Marital status: Divorced    Spouse name: Not on file   Number of children: 1  Years of education: Not on file   Highest education level: Master's degree (e.g., MA, MS, MEng, MEd, MSW, MBA)  Occupational History   Occupation: RN  Tobacco Use   Smoking status: Former    Packs/day: 1.00    Years: 35.00    Total pack years: 35.00    Types: Cigarettes    Quit date: 08/09/2006    Years since quitting: 15.9    Passive exposure: Never   Smokeless tobacco: Never  Vaping Use   Vaping Use: Never used  Substance and Sexual Activity   Alcohol use: Not Currently    Comment: rare   Drug use: No   Sexual activity: Never    Birth control/protection: Surgical  Other Topics Concern   Not on file  Social History Narrative   Single, livevs alone in a 2 story house. Drinks two sodas a day. Does not exercise regularly.   Social Determinants of Health   Financial Resource Strain: Not on file  Food Insecurity: Not on file  Transportation Needs: Not on file  Physical Activity: Not on file  Stress: Not on file  Social Connections: Not on file  Intimate Partner Violence: Not on file      PHYSICAL EXAM  Vitals:   08/01/22 0909  BP: 108/67  Pulse: 64  Weight: 118 lb 9.6 oz (53.8 kg)  Height: '5\' 7"'$  (1.702 m)   Body mass index is 18.58 kg/m.  Generalized: Well developed, in no acute distress  Chest: Lungs clear to auscultation bilaterally  Neurological examination  Mentation: Alert oriented to time, place, history taking. Follows all commands speech and language fluent Cranial nerve II-XII: Extraocular movements were full, visual field were full on confrontational test Head turning and shoulder shrug  were normal and symmetric.   DIAGNOSTIC DATA (LABS, IMAGING, TESTING) - I reviewed patient records, labs, notes, testing and imaging myself where available.  Lab Results  Component Value Date   WBC 7.2 06/27/2022   HGB 13.2 06/27/2022   HCT 37.8  06/27/2022   MCV 89.1 06/27/2022   PLT 232.0 06/27/2022      Component Value Date/Time   NA 141 06/27/2022 1212   K 4.0 06/27/2022 1212   CL 111 06/27/2022 1212   CO2 24 06/27/2022 1212   GLUCOSE 97 06/27/2022 1212   BUN 14 06/27/2022 1212   CREATININE 0.63 06/27/2022 1212   CREATININE 0.78 12/15/2019 0834   CALCIUM 8.9 06/27/2022 1212   PROT 6.5 06/27/2022 1212   ALBUMIN 4.1 06/27/2022 1212   AST 14 06/27/2022 1212   ALT 11 06/27/2022 1212   ALKPHOS 64 06/27/2022 1212   BILITOT 0.4 06/27/2022 1212   GFRNONAA >60 12/14/2021 1213   GFRNONAA 84 12/15/2019 0834   GFRAA >60 06/01/2020 1541   GFRAA 97 12/15/2019 0834   Lab Results  Component Value Date   CHOL 171 12/07/2021   HDL 30 (L) 12/07/2021   LDLCALC 119 (H) 12/07/2021   TRIG 110 12/07/2021   CHOLHDL 5.7 12/07/2021   No results found for: "HGBA1C" Lab Results  Component Value Date   VITAMINB12 1,404 (H) 05/14/2022   Lab Results  Component Value Date   TSH 2.000 01/12/2021      ASSESSMENT AND PLAN 61 y.o. year old female  has a past medical history of Anal fissure, Anemia, Anxiety, Arthritis, Breast calcifications on mammogram, Cancer ([place]), Closed right ankle fracture, Colon polyp, CVA (cerebral vascular accident) ([place]), Dyslipidemia (06/24/2018), Elevated cholesterol, Facet arthropathy, lumbosacral (08/28/2017), Family history of adverse reaction  to anesthesia, GERD (gastroesophageal reflux disease), Heart murmur, History of colon polyps, History of kidney stones, IBS (irritable bowel syndrome), Inappropriate sinus tachycardia, Migraines, Osteopenia, Osteoporosis of femur without pathological fracture (08/28/2017), Palpitations, PONV (postoperative nausea and vomiting), Premature surgical menopause, PTSD (post-traumatic stress disorder), Right wrist fracture, Sleep apnea, and Stroke ([place]). here with:  OSA on CPAP  - CPAP compliance excellent - Good treatment of AHI  - Encourage patient to use CPAP nightly and >  4 hours each night - F/U in 1 year or sooner if needed    Ward Givens, MSN, NP-C 08/01/2022, 9:14 AM [facility] [address] [place], [place] [address] [phone]

## 2022-08-01 NOTE — Patient Instructions (Signed)
Continue using CPAP nightly and greater than 4 hours each night °If your symptoms worsen or you develop new symptoms please let us know.  ° °

## 2022-08-02 ENCOUNTER — Other Ambulatory Visit (HOSPITAL_BASED_OUTPATIENT_CLINIC_OR_DEPARTMENT_OTHER): Payer: Self-pay

## 2022-08-02 MED ORDER — FLUARIX QUADRIVALENT 0.5 ML IM SUSY
0.5000 mL | PREFILLED_SYRINGE | INTRAMUSCULAR | 0 refills | Status: AC
  Filled 2022-08-02: qty 0.5, 1d supply, fill #0

## 2022-08-03 NOTE — Telephone Encounter (Signed)
Prolia VOB initiated via [email or web address]  Last Prolia inj 03/20/22 Next Prolia inj due 09/21/22

## 2022-08-08 NOTE — Telephone Encounter (Signed)
Prior auth required for PROLIA  PA PROCESS DETAILS: PA is required and is currently not on file. Please call Medical Review at [phone] to initiate PA.

## 2022-08-14 ENCOUNTER — Encounter: Payer: Self-pay | Admitting: Gastroenterology

## 2022-08-14 ENCOUNTER — Ambulatory Visit (AMBULATORY_SURGERY_CENTER): Payer: No Typology Code available for payment source | Admitting: Gastroenterology

## 2022-08-14 VITALS — BP 127/63 | HR 70 | Temp 98.7°F | Resp 16 | Ht 67.0 in | Wt 116.0 lb

## 2022-08-14 DIAGNOSIS — K219 Gastro-esophageal reflux disease without esophagitis: Secondary | ICD-10-CM

## 2022-08-14 DIAGNOSIS — K449 Diaphragmatic hernia without obstruction or gangrene: Secondary | ICD-10-CM

## 2022-08-14 DIAGNOSIS — K2970 Gastritis, unspecified, without bleeding: Secondary | ICD-10-CM | POA: Diagnosis not present

## 2022-08-14 DIAGNOSIS — K582 Mixed irritable bowel syndrome: Secondary | ICD-10-CM | POA: Diagnosis not present

## 2022-08-14 DIAGNOSIS — K2990 Gastroduodenitis, unspecified, without bleeding: Secondary | ICD-10-CM | POA: Diagnosis not present

## 2022-08-14 MED ORDER — SODIUM CHLORIDE 0.9 % IV SOLN: 500.0000 mL | Freq: Once | INTRAVENOUS | Status: DC

## 2022-08-14 NOTE — Progress Notes (Signed)
Chief Complaint: FU Abdominal pain  Referring Provider:  Mackie Pai, PA-C      ASSESSMENT AND PLAN;   #1.  Unexplained wt loss, intermittent N/V.  #2. GERD with small HH (on CT and EGD) with intermittent N/V. Neg [place] 08/2018. Neg HIDA with EF remotely per pt.  #3. FH colon cancer (mom) at age 60 (Stage IV).  H/O adenomatous colonic polyps in past. Neg EGD/colon 01/2019. Rpt colon 01/2024  #4. IBS with alt diarrhea/constipation/LLQ pain. Neg stool studies, CT abdo/pelvis 10/2018 and labs including TSH, sed rate and celiac screen.  #5. CT A/P 10/2018-neg except for small 9 mm omental nodule.  Radiology recommend rpt CT in 3 months.  Plan:  -CT AP with contrast -CBC, CMP, lipase-- Nl -EGD/colon after neuro clearence (Dr Jaynee Eagles) -Continue protonix '40mg'$  po QD -Zofran '4mg'$  ODT Q6hrs PRN for N/V, #30, 3 refills  -If neg eval, and still with problems, proceed with solid GES to rule out gastroparesis. -Consider trial of Remeron if continued weight loss.    CT AP With contrast 06/2022 1. Air fluid levels in the distal colon indicating diarrheal process. Mild prominence of the colon caliber, including the rectum. Distal esophageal wall thickening similar prior, esophagitis would be a common cause. 2. Mild bibasilar scarring or atelectasis. 3. Stable tiny suspected hemangioma inferiorly in the right hepatic lobe. 4.  Aortic Atherosclerosis (ICD10-I70.0). HPI:    Alice Williams is a 61 y.o. female  RN at [employer] With anxiety, recent MCA CVA 11/2023, neuropathy, multiple comorbidities as below  Referred back for weight loss- 20lb over last 3 to 4 months with a decreased appetite, early satiety.  She does have nausea and occasional vomiting.  Also postprandial epi pain.  No odynophagia or dysphagia.  She denies having any heartburn as long as she takes Protonix.  No significant diarrhea or constipation.  No nonsteroidals except for baby aspirin.  Has been advised to get repeat  EGD/colon        From previous notes : celiac screen was negative except for TTG IgG which was borderline positive.  CT scan Abdo/pelvis was unremarkable. Previously diagnosed as having IBS-had negative EGD and colonoscopy at [facility] 2016 Denies having any intolerance to gluten/milk or cheese.  She asked to drink milk.  Previously when she was constipated she would take corn and milk with good bowel movements. No melena or hematochezia.  Has a stressful job.  Not been sleeping very well.  Also with previous history of endometriosis.  Wt Readings from Last 3 Encounters:  08/14/22 116 lb (52.6 kg)  08/01/22 118 lb 9.6 oz (53.8 kg)  07/11/22 112 lb 1.9 oz (50.9 kg)      PSH - Works as Marine scientist 6[employer]  Past GI procedures: -Colonoscopy 01/2019 with negative random colonic biopsies and TI biopsies.  Hyperplastic polyps.  Repeat in 5 years due to family history. -EGD 01/2019 with negative gastric and small bowel biopsies.  Showed mild gastritis and small HH. Past Medical History:  Diagnosis Date   Anal fissure    Anemia    in early 20's   Anxiety    Arthritis    back   Breast calcifications on mammogram    Cancer [facility])    pre cancer colon,cervix   Closed right ankle fracture    Colon polyp    CVA (cerebral vascular accident) ([place])    Dyslipidemia 06/24/2018   Elevated cholesterol    Facet arthropathy, lumbosacral 08/28/2017   Family history of adverse  reaction to anesthesia    sibling has N/V   GERD (gastroesophageal reflux disease)    Heart murmur    History of colon polyps    History of kidney stones    has small ones   IBS (irritable bowel syndrome)    Inappropriate sinus tachycardia    Migraines    Osteopenia    Osteoporosis of femur without pathological fracture 08/28/2017   Palpitations    PONV (postoperative nausea and vomiting)    Premature surgical menopause    PTSD (post-traumatic stress disorder)    Right wrist fracture    Sleep apnea    Stroke [facility])      Past Surgical History:  Procedure Laterality Date   ANAL FISSURE REPAIR     APPENDECTOMY  2000   COLONOSCOPY W/ POLYPECTOMY  03/2015   COLONOSCOPY WITH ESOPHAGOGASTRODUODENOSCOPY (EGD)  04/01/2015   KNEE SURGERY     OOPHORECTOMY     OOPHORECTOMY     1995, 1998   ORIF ANKLE FRACTURE Right 11/28/2021   Procedure: OPEN REDUCTION INTERNAL FIXATION (ORIF) ANKLE FRACTURE;  Surgeon: Meredith Pel, MD;  Location: [place];  Service: Orthopedics;  Laterality: Right;   PELVIC LAPAROSCOPY     x 4 for endometriosis    TONSILLECTOMY     VAGINAL HYSTERECTOMY  1994   WISDOM TOOTH EXTRACTION      Family History  Problem Relation Age of Onset   Colon cancer Mother    Breast cancer Mother 40       and liver   Migraines Mother    Hyperlipidemia Father    Alcohol abuse Father    Heart attack Father    Cancer Father        Head and neck    Breast cancer Sister    Migraines Sister    Migraines Sister    Kidney cancer Sister    Renal cancer Sister    Hyperlipidemia Brother    Atrial fibrillation Brother    Sleep apnea Brother    Lung cancer Maternal Grandmother    Parkinson's disease Maternal Grandfather    Dementia Maternal Grandfather    Prostate cancer Maternal Grandfather    Parkinson's disease Paternal Grandmother    Heart attack Paternal Grandfather    Esophageal cancer Neg Hx    Stroke Neg Hx     Social History   Tobacco Use   Smoking status: Former    Packs/day: 1.00    Years: 35.00    Total pack years: 35.00    Types: Cigarettes    Quit date: 08/09/2006    Years since quitting: 16.0    Passive exposure: Never   Smokeless tobacco: Never  Vaping Use   Vaping Use: Never used  Substance Use Topics   Alcohol use: Not Currently    Comment: rare   Drug use: No    Current Outpatient Medications  Medication Sig Dispense Refill   acetaminophen (TYLENOL) 500 MG tablet Take 500 mg by mouth every 6 (six) hours as needed for mild pain, fever or headache.      Atogepant 60 MG TABS Take 1 tablet (60 mg) by mouth daily. 90 tablet 4   atorvastatin (LIPITOR) 40 MG tablet Take 1 tablet (40 mg total) by mouth daily. 90 tablet 1   escitalopram (LEXAPRO) 10 MG tablet Take 1 tablet (10 mg total) by mouth daily. 30 tablet 0   famotidine (PEPCID) 20 MG tablet Take 1 tablet (20 mg total) by mouth daily. 30 tablet  0   nadolol (CORGARD) 40 MG tablet Take 1 tablet (40 mg total) by mouth daily. 90 tablet 3   oxybutynin (DITROPAN XL) 5 MG 24 hr tablet Take 1 tablet (5 mg total) by mouth at bedtime. 90 tablet 1   pantoprazole (PROTONIX) 40 MG tablet TAKE 1 TABLET BY MOUTH ONCE DAILY 30 tablet 6   polycarbophil (FIBERCON) 625 MG tablet Take 625 mg by mouth daily.     Riboflavin 400 MG CAPS Take 400 mg by mouth daily.     Topiramate ER (TROKENDI XR) 200 MG CP24 Take 1 capsule (200 mg) by mouth daily. 90 capsule 4   Vitamin D, Ergocalciferol, (DRISDOL) 1.25 MG (50000 UNIT) CAPS capsule Take 1 capsule (50,000 Units total) by mouth every 7 (seven) days. Take for 8 total doses(weeks) 8 capsule 0   Calcium Carbonate-Vitamin D 600-400 MG-UNIT tablet Take 1 tablet by mouth 2 (two) times daily. 60 tablet 11   Coenzyme Q10 (CO Q 10 PO) Take 1 tablet by mouth daily.     denosumab (PROLIA) 60 MG/ML SOSY injection Inject 60 mg into the skin every 6 (six) months. 1 mL 1   influenza vac split quadrivalent PF (FLUARIX QUADRIVALENT) 0.5 ML injection Inject 0.5 mLs into the muscle. 0.5 mL 0   ondansetron (ZOFRAN) 4 MG tablet Take 1 tablet (4 mg total) by mouth every 8 (eight) hours as needed for nausea or vomiting. 20 tablet 0   ondansetron (ZOFRAN-ODT) 4 MG disintegrating tablet Take 1 tablet (4 mg total) by mouth every 6 (six) hours as needed for nausea or vomiting. 30 tablet 2   oxyCODONE-acetaminophen (PERCOCET) 5-325 MG tablet Take 1 tablet by mouth every 4 (four) hours as needed for severe pain. 30 tablet 0   traZODone (DESYREL) 50 MG tablet TAKE 1/2 - 1 TABLET BY MOUTH EVERY NIGHT AT  BEDTIME AS NEEDED FOR SLEEP 30 tablet 3   Current Facility-Administered Medications  Medication Dose Route Frequency Provider Last Rate Last Admin   0.9 %  sodium chloride infusion  500 mL Intravenous Once Jackquline Denmark, MD        Allergies  Allergen Reactions   Sulfa Antibiotics Anaphylaxis   Sumatriptan Succinate Anaphylaxis   Emgality [Galcanezumab-Gnlm] Nausea Only    GI upset - diarrhea, nausea    Review of Systems:  Neg Has anxiety and sleep problems Has stressful job.     Physical Exam:    BP 116/77   Pulse 80   Temp 98.7 F (37.1 C) (Temporal)   Ht '5\' 7"'$  (1.702 m)   Wt 116 lb (52.6 kg)   SpO2 100%   BMI 18.17 kg/m  Filed Weights   08/14/22 1312  Weight: 116 lb (52.6 kg)   Constitutional: Thin built, in no acute distress. Psychiatric: Normal mood and affect. Behavior is normal. HEENT: Pupils normal.  Conjunctivae are normal. No scleral icterus.  Wearing mask. Cardiovascular: Normal rate, regular rhythm. No edema Pulmonary/chest: Effort normal and breath sounds normal. No wheezing, rales or rhonchi. Abdominal: Soft, nondistended.  Mild left lower quadrant abdominal tenderness, no rebound. Bowel sounds active throughout. There are no masses palpable. No hepatomegaly. Rectal:  defered Neurological: Alert and oriented to person place and time. Skin: Skin is warm and dry. No rashes noted.  Data Reviewed: I have personally reviewed following labs and imaging studies  CBC:    Latest Ref Rng & Units 06/27/2022   12:12 PM 05/14/2022   12:01 PM 12/21/2021   11:32 AM  CBC  WBC 4.0 - 10.5 K/uL 7.2  7.5  11.1   Hemoglobin 12.0 - 15.0 g/dL 13.2  12.0  12.6   Hematocrit 36.0 - 46.0 % 37.8  34.8  37.7   Platelets 150.0 - 400.0 K/uL 232.0  202.0  253.0     CMP:    Latest Ref Rng & Units 06/27/2022   12:12 PM 05/14/2022   12:01 PM 01/11/2022   10:35 AM  CMP  Glucose 70 - 99 mg/dL 97  92  103   BUN 6 - 23 mg/dL '14  19  9   '$ Creatinine 0.40 - 1.20 mg/dL 0.63  0.71   0.73   Sodium 135 - 145 mEq/L 141  143  138   Potassium 3.5 - 5.1 mEq/L 4.0  3.7  3.4   Chloride 96 - 112 mEq/L 111  109  107   CO2 19 - 32 mEq/L '24  26  23   '$ Calcium 8.4 - 10.5 mg/dL 8.9  9.3  8.9   Total Protein 6.0 - 8.3 g/dL 6.5  6.2  5.9   Total Bilirubin 0.2 - 1.2 mg/dL 0.4  0.4  0.5   Alkaline Phos 39 - 117 U/L 64  62  72   AST 0 - 37 U/L '14  16  19   '$ ALT 0 - 35 U/L '11  16  14    '$ CT 11/19/2018 IMPRESSION: 1. No acute findings in the abdomen or pelvis. Specifically, no findings to explain the patient's history of left-sided abdominal pain. 2. 9 mm omental nodule identified lower left abdomen. This does not appear to be a diverticulum. Follow-up CT in 3 months recommended to ensure stability. 3. Tiny hiatal hernia.     Carmell Austria, MD 08/14/2022, 2:21 PM  Cc: Mackie Pai, PA-C

## 2022-08-14 NOTE — Patient Instructions (Addendum)
-handout on gastritis and hiatal hernia provided  - Patient has a contact number available for emergencies. The signs and symptoms of potential delayed complications were discussed with the patient. Return to normal activities tomorrow. Written discharge instructions were provided to the patient. - Resume previous diet. - Continue present medications including Protonix 40 mg p.o. daily. - Await pathology results. - Repeat colonoscopy in 5 years for screening purposes. Earlier, if with any new problems or change in family history. - The findings and recommendations were discussed with the patient's family.  YOU HAD AN ENDOSCOPIC PROCEDURE TODAY AT [facility]:   Refer to the procedure report that was given to you for any specific questions about what was found during the examination.  If the procedure report does not answer your questions, please call your gastroenterologist to clarify.  If you requested that your care partner not be given the details of your procedure findings, then the procedure report has been included in a sealed envelope for you to review at your convenience later.  YOU SHOULD EXPECT: Some feelings of bloating in the abdomen. Passage of more gas than usual.  Walking can help get rid of the air that was put into your GI tract during the procedure and reduce the bloating. If you had a lower endoscopy (such as a colonoscopy or flexible sigmoidoscopy) you may notice spotting of blood in your stool or on the toilet paper. If you underwent a bowel prep for your procedure, you may not have a normal bowel movement for a few days.  Please Note:  You might notice some irritation and congestion in your nose or some drainage.  This is from the oxygen used during your procedure.  There is no need for concern and it should clear up in a day or so.  SYMPTOMS TO REPORT IMMEDIATELY:  Following lower endoscopy (colonoscopy or flexible sigmoidoscopy):  Excessive amounts of blood  in the stool  Significant tenderness or worsening of abdominal pains  Swelling of the abdomen that is new, acute  Fever of 100F or higher  Following upper endoscopy (EGD)  Vomiting of blood or coffee ground material  New chest pain or pain under the shoulder blades  Painful or persistently difficult swallowing  New shortness of breath  Fever of 100F or higher  Black, tarry-looking stools  For urgent or emergent issues, a gastroenterologist can be reached at any hour by calling [phone]. Do not use MyChart messaging for urgent concerns.    DIET:  We do recommend a small meal at first, but then you may proceed to your regular diet.  Drink plenty of fluids but you should avoid alcoholic beverages for 24 hours.  ACTIVITY:  You should plan to take it easy for the rest of today and you should NOT DRIVE or use heavy machinery until tomorrow (because of the sedation medicines used during the test).    FOLLOW UP: Our staff will call the number listed on your records the next business day following your procedure.  We will call around 7:15- 8:00 am to check on you and address any questions or concerns that you may have regarding the information given to you following your procedure. If we do not reach you, we will leave a message.     If any biopsies were taken you will be contacted by phone or by letter within the next 1-3 weeks.  Please call us at [phone] if you have not heard about the biopsies in  3 weeks.    SIGNATURES/CONFIDENTIALITY: You and/or your care partner have signed paperwork which will be entered into your electronic medical record.  These signatures attest to the fact that that the information above on your After Visit Summary has been reviewed and is understood.  Full responsibility of the confidentiality of this discharge information lies with you and/or your care-partner.

## 2022-08-14 NOTE — Op Note (Signed)
[facility] Patient Name: Alice Williams Procedure Date: 08/14/2022 2:29 PM MRN: [id number] Endoscopist: Jackquline Denmark , MD Age: 61 Referring MD:  Date of Birth: [birth year] Gender: Female Account #: [id number] Procedure:                Upper GI endoscopy Indications:              Epigastric abdominal pain with intermittent N/V, wt                            loss. CT AP with contrast showing distal esophageal                            wall thickening Medicines:                Monitored Anesthesia Care Procedure:                Pre-Anesthesia Assessment:                           - Prior to the procedure, a History and Physical                            was performed, and patient medications and                            allergies were reviewed. The patient's tolerance of                            previous anesthesia was also reviewed. The risks                            and benefits of the procedure and the sedation                            options and risks were discussed with the patient.                            All questions were answered, and informed consent                            was obtained. Prior Anticoagulants: The patient has                            taken no previous anticoagulant or antiplatelet                            agents. ASA Grade Assessment: II - A patient with                            mild systemic disease. After reviewing the risks                            and benefits, the patient was deemed in  satisfactory condition to undergo the procedure.                           After obtaining informed consent, the endoscope was                            passed under direct vision. Throughout the                            procedure, the patient's blood pressure, pulse, and                            oxygen saturations were monitored continuously. The                            Endoscope was introduced through the  mouth, and                            advanced to the second part of duodenum. The upper                            GI endoscopy was accomplished without difficulty.                            The patient tolerated the procedure well. Scope In: Scope Out: Findings:                 The examined esophagus was normal. Biopsies were                            obtained from the proximal and distal esophagus                            with cold forceps for histology of suspected                            eosinophilic esophagitis.                           The Z-line was regular and was found 35 cm from the                            incisors.                           A 2 cm hiatal hernia was present.                           Localized mild inflammation characterized by                            erythema was found in the gastric antrum. Biopsies                            were taken with a cold forceps for histology.  The examined duodenum was normal. Biopsies for                            histology were taken with a cold forceps for                            evaluation of celiac disease. Complications:            No immediate complications. Estimated Blood Loss:     Estimated blood loss: none. Impression:               - Small hiatal hernia.                           - Mild gastritis. Biopsied. Recommendation:           - Patient has a contact number available for                            emergencies. The signs and symptoms of potential                            delayed complications were discussed with the                            patient. Return to normal activities tomorrow.                            Written discharge instructions were provided to the                            patient.                           - Resume previous diet.                           - Continue present medications including Protonix                            40 mg p.o.  daily.                           - Await pathology results.                           - The findings and recommendations were discussed                            with the patient's family. Jackquline Denmark, MD 08/14/2022 2:43:57 PM This report has been signed electronically.

## 2022-08-14 NOTE — Progress Notes (Signed)
Pt in recovery with monitors in place, VSS. Report given to receiving RN. Bite guard was placed with pt awake to ensure comfort. No dental or soft tissue damage noted. 

## 2022-08-14 NOTE — Progress Notes (Signed)
Pt's states no medical or surgical changes since previsit or office visit. 

## 2022-08-14 NOTE — Progress Notes (Signed)
Called to room to assist during endoscopic procedure.  Patient ID and intended procedure confirmed with present staff. Received instructions for my participation in the procedure from the performing physician.  

## 2022-08-14 NOTE — Op Note (Signed)
[facility] Patient Name: Alice Williams Procedure Date: 08/14/2022 2:28 PM MRN: [id number] Endoscopist: Jackquline Denmark , MD Age: 61 Referring MD:  Date of Birth: June 01, 1961 Gender: Female Account #: [id number] Procedure:                Colonoscopy Indications:              1. FH colon cancer (mom) at age 12 (Stage IV). 2.                            H/O adenomatous colonic polyps in past. 3. IBS-D                            with CT AP showing air-fluid levels in the colon. Medicines:                Monitored Anesthesia Care Procedure:                Pre-Anesthesia Assessment:                           - Prior to the procedure, a History and Physical                            was performed, and patient medications and                            allergies were reviewed. The patient's tolerance of                            previous anesthesia was also reviewed. The risks                            and benefits of the procedure and the sedation                            options and risks were discussed with the patient.                            All questions were answered, and informed consent                            was obtained. Prior Anticoagulants: The patient has                            taken no previous anticoagulant or antiplatelet                            agents. ASA Grade Assessment: II - A patient with                            mild systemic disease. After reviewing the risks                            and benefits, the patient was deemed in  satisfactory condition to undergo the procedure.                           After obtaining informed consent, the colonoscope                            was passed under direct vision. Throughout the                            procedure, the patient's blood pressure, pulse, and                            oxygen saturations were monitored continuously. The                            Olympus  [id number]-HQ190L [id number]) Colonoscope was                            introduced through the anus and advanced to the 2                            cm into the ileum. The colonoscopy was performed                            without difficulty. The patient tolerated the                            procedure well. The quality of the bowel                            preparation was good. The terminal ileum, ileocecal                            valve, appendiceal orifice, and rectum were                            photographed. Scope In: 2:46:02 PM Scope Out: 3:04:44 PM Scope Withdrawal Time: 0 hours 12 minutes 12 seconds  Total Procedure Duration: 0 hours 18 minutes 42 seconds  Findings:                 The colon (entire examined portion) appeared                            normal. Biopsies for histology were taken with a                            cold forceps from the entire colon for evaluation                            of microscopic colitis.                           Non-bleeding internal hemorrhoids were found during  retroflexion. The hemorrhoids were small and Grade                            I (internal hemorrhoids that do not prolapse).                           The terminal ileum appeared normal.                           The exam was otherwise without abnormality on                            direct and retroflexion views. Complications:            No immediate complications. Estimated Blood Loss:     Estimated blood loss: none. Impression:               - The entire examined colon is normal. Biopsied.                           - Non-bleeding internal hemorrhoids.                           - The examined portion of the ileum was normal.                           - The examination was otherwise normal on direct                            and retroflexion views. Recommendation:           - Patient has a contact number available for                             emergencies. The signs and symptoms of potential                            delayed complications were discussed with the                            patient. Return to normal activities tomorrow.                            Written discharge instructions were provided to the                            patient.                           - Resume previous diet.                           - Continue present medications.                           - Await pathology results.                           -  Repeat colonoscopy in 5 years for screening                            purposes. Earlier, if with any new problems or                            change in family history.                           - The findings and recommendations were discussed                            with the patient's family. Jackquline Denmark, MD 08/14/2022 3:08:43 PM This report has been signed electronically.

## 2022-08-15 ENCOUNTER — Telehealth: Payer: Self-pay

## 2022-08-15 NOTE — Telephone Encounter (Signed)
Left message on follow up call. 

## 2022-08-18 ENCOUNTER — Encounter: Payer: Self-pay | Admitting: Gastroenterology

## 2022-08-19 NOTE — Telephone Encounter (Signed)
Prior Authorization initiated for PROLIA via [email or web address] KEY: [id number]

## 2022-08-20 NOTE — Telephone Encounter (Signed)
Prolia PA is approved for a max of 2 fills from 08/19/22 to 08/19/2023.

## 2022-08-28 NOTE — Telephone Encounter (Signed)
[id number] - PA Case ID: [id number] Valid: 08/19/22-08/19/23

## 2022-08-28 NOTE — Telephone Encounter (Signed)
PHARMACY BENEFIT Alice Williams, please send Rx - approved via pharmacy benefits, NOT approved through buy and bill)   Pt ready for scheduling on or after 09/21/22   Out-of-pocket cost due at time of visit: $0 Estimated OOP Cost via Pharmacy with Copay Card: $25   Primary: [place] [employer] Prolia co-insurance: [employer] copay card Admin fee co-insurance: [employer] copay card   Secondary: n/a Prolia co-insurance:  Admin fee co-insurance:    Deductible: does not apply   Prior Auth: APPROVED Key: [id number] - [place] Case ID: [id number] Valid: 08/19/22-08/19/23     ** This summary of benefits is an estimation of the patient's out-of-pocket cost. Exact cost may very based on individual plan coverage.

## 2022-08-29 NOTE — Telephone Encounter (Signed)
Pt informed of below.  Nurse visit scheduled 09/18/22.

## 2022-09-16 ENCOUNTER — Other Ambulatory Visit: Payer: Self-pay | Admitting: Medical

## 2022-09-16 ENCOUNTER — Other Ambulatory Visit: Payer: Self-pay | Admitting: Cardiology

## 2022-09-16 DIAGNOSIS — I4719 Other supraventricular tachycardia: Secondary | ICD-10-CM

## 2022-09-17 ENCOUNTER — Other Ambulatory Visit (HOSPITAL_BASED_OUTPATIENT_CLINIC_OR_DEPARTMENT_OTHER): Payer: Self-pay

## 2022-09-17 MED ORDER — PANTOPRAZOLE SODIUM 40 MG PO TBEC
40.0000 mg | DELAYED_RELEASE_TABLET | Freq: Every day | ORAL | 11 refills | Status: AC
  Filled 2022-09-17: qty 30, 30d supply, fill #0
  Filled 2022-10-11: qty 30, 30d supply, fill #1

## 2022-09-17 MED ORDER — ESCITALOPRAM OXALATE 10 MG PO TABS
10.0000 mg | ORAL_TABLET | Freq: Every day | ORAL | 0 refills | Status: DC
  Filled 2022-09-17: qty 30, 30d supply, fill #0

## 2022-09-17 MED ORDER — NADOLOL 40 MG PO TABS
40.0000 mg | ORAL_TABLET | Freq: Every day | ORAL | 3 refills | Status: AC
  Filled 2022-09-17: qty 90, 90d supply, fill #0

## 2022-09-17 MED ORDER — ATORVASTATIN CALCIUM 40 MG PO TABS
40.0000 mg | ORAL_TABLET | Freq: Every day | ORAL | 1 refills | Status: AC
  Filled 2022-09-17: qty 90, 90d supply, fill #0

## 2022-09-18 ENCOUNTER — Ambulatory Visit (INDEPENDENT_AMBULATORY_CARE_PROVIDER_SITE_OTHER): Payer: No Typology Code available for payment source

## 2022-09-18 DIAGNOSIS — M8080XG Other osteoporosis with current pathological fracture, unspecified site, subsequent encounter for fracture with delayed healing: Secondary | ICD-10-CM

## 2022-09-18 MED ORDER — DENOSUMAB 60 MG/ML ~~LOC~~ SOSY
60.0000 mg | PREFILLED_SYRINGE | Freq: Once | SUBCUTANEOUS | Status: AC
  Administered 2022-09-18: 60 mg via SUBCUTANEOUS

## 2022-09-18 NOTE — Progress Notes (Signed)
Patient here for her prolia injection. Patient received '60mg'$ / mL X 1 injection SQ in right arm. She tolerated injection well. She will return in 6 months for her next Prolia.

## 2022-09-27 NOTE — Telephone Encounter (Signed)
Last Prolia inj 09/18/22 Next Prolia inj due 03/20/23

## 2022-10-11 ENCOUNTER — Other Ambulatory Visit: Payer: Self-pay | Admitting: Medical

## 2022-10-11 ENCOUNTER — Other Ambulatory Visit (HOSPITAL_BASED_OUTPATIENT_CLINIC_OR_DEPARTMENT_OTHER): Payer: Self-pay

## 2022-10-11 MED ORDER — ESCITALOPRAM OXALATE 10 MG PO TABS
10.0000 mg | ORAL_TABLET | Freq: Every day | ORAL | 0 refills | Status: AC
  Filled 2022-10-11: qty 30, 30d supply, fill #0

## 2022-10-11 MED ORDER — FAMOTIDINE 20 MG PO TABS
20.0000 mg | ORAL_TABLET | Freq: Every day | ORAL | 0 refills | Status: AC
  Filled 2022-10-11: qty 30, 30d supply, fill #0

## 2022-10-12 ENCOUNTER — Other Ambulatory Visit: Payer: Self-pay

## 2022-10-12 ENCOUNTER — Other Ambulatory Visit (HOSPITAL_BASED_OUTPATIENT_CLINIC_OR_DEPARTMENT_OTHER): Payer: Self-pay

## 2023-01-29 NOTE — Telephone Encounter (Signed)
Prolia VOB initiated via [email or web address]  Last Prolia inj 09/18/22 Next Prolia inj due 03/20/23

## 2023-01-29 NOTE — Telephone Encounter (Signed)
Please contact pt to get update insurance information.   Thank you!

## 2023-01-30 NOTE — Telephone Encounter (Signed)
Left detailed message for patient to call back and provide most recent insurance information to Annabell Sabal, front desk supervisor at [facility].

## 2023-02-11 ENCOUNTER — Encounter: Payer: Self-pay | Admitting: *Deleted

## 2023-02-21 ENCOUNTER — Ambulatory Visit: Payer: No Typology Code available for payment source | Admitting: Adult Health

## 2023-05-03 ENCOUNTER — Telehealth: Payer: Self-pay

## 2023-05-03 NOTE — Telephone Encounter (Signed)
Alice Williams called the office from [facility] asking if we could send them the xrays that were taken of the patient in our office. Colin Mulders states the patient is coming into their office for a consult on 05/08/2023 for decrease use and pain in both hands. Alice Williams we can make a CD of the patient's xrays and have the patient pick up the CD to bring to their office. Alice Williams this could not be done until Monday. Call back number is [phone].   Attempted to contact the patient to advise that we would have to burn a CD for her to take with her to her appointment.

## 2023-05-06 NOTE — Telephone Encounter (Signed)
Advised the patient we can make a CD of the patient's xrays and she can  pick up the CD to bring to their office. Patient verbalized understanding. Patient states she can pick up the CD tomorrow.

## 2023-05-06 NOTE — Telephone Encounter (Signed)
CD burned and ready at the front for patient to pick up.

## 2023-07-20 ENCOUNTER — Encounter (HOSPITAL_COMMUNITY): Payer: Self-pay

## 2023-09-27 ENCOUNTER — Other Ambulatory Visit: Payer: Self-pay
